# Patient Record
Sex: Male | Born: 1960 | Race: White | Hispanic: No | Smoking: Former smoker
Health system: Southern US, Community
[De-identification: ages and names within clinical notes are randomized; demographics above are authoritative.]

## PROBLEM LIST (undated history)

## (undated) DIAGNOSIS — C801 Malignant (primary) neoplasm, unspecified: Secondary | ICD-10-CM

## (undated) DIAGNOSIS — E119 Type 2 diabetes mellitus without complications: Secondary | ICD-10-CM

## (undated) DIAGNOSIS — E785 Hyperlipidemia, unspecified: Secondary | ICD-10-CM

## (undated) DIAGNOSIS — N400 Enlarged prostate without lower urinary tract symptoms: Secondary | ICD-10-CM

## (undated) HISTORY — DX: Benign prostatic hyperplasia without lower urinary tract symptoms: N40.0

## (undated) HISTORY — PX: KNEE ARTHROSCOPY: SUR90

## (undated) HISTORY — DX: Hyperlipidemia, unspecified: E78.5

---

## 2005-09-09 ENCOUNTER — Ambulatory Visit (HOSPITAL_BASED_OUTPATIENT_CLINIC_OR_DEPARTMENT_OTHER): Admission: RE | Admit: 2005-09-09 | Discharge: 2005-09-09 | Payer: Self-pay | Admitting: Family Medicine

## 2005-09-15 ENCOUNTER — Ambulatory Visit: Payer: Self-pay | Admitting: Internal Medicine

## 2005-10-06 ENCOUNTER — Ambulatory Visit (HOSPITAL_BASED_OUTPATIENT_CLINIC_OR_DEPARTMENT_OTHER): Admission: RE | Admit: 2005-10-06 | Discharge: 2005-10-06 | Payer: Self-pay | Admitting: Family Medicine

## 2005-10-13 ENCOUNTER — Ambulatory Visit: Payer: Self-pay | Admitting: Internal Medicine

## 2016-07-14 DIAGNOSIS — E119 Type 2 diabetes mellitus without complications: Secondary | ICD-10-CM

## 2016-07-14 HISTORY — DX: Type 2 diabetes mellitus without complications: E11.9

## 2016-07-29 ENCOUNTER — Inpatient Hospital Stay (HOSPITAL_COMMUNITY)
Admission: EM | Admit: 2016-07-29 | Discharge: 2016-08-01 | DRG: 638 | Disposition: A | Discharge status: Home / Self Care | Payer: Self-pay | Attending: Internal Medicine | Admitting: Internal Medicine

## 2016-07-29 ENCOUNTER — Emergency Department (HOSPITAL_COMMUNITY): Payer: Self-pay

## 2016-07-29 ENCOUNTER — Encounter (HOSPITAL_COMMUNITY): Payer: Self-pay | Admitting: Emergency Medicine

## 2016-07-29 DIAGNOSIS — Z87891 Personal history of nicotine dependence: Secondary | ICD-10-CM

## 2016-07-29 DIAGNOSIS — E1122 Type 2 diabetes mellitus with diabetic chronic kidney disease: Secondary | ICD-10-CM | POA: Diagnosis present

## 2016-07-29 DIAGNOSIS — Z8042 Family history of malignant neoplasm of prostate: Secondary | ICD-10-CM

## 2016-07-29 DIAGNOSIS — E1165 Type 2 diabetes mellitus with hyperglycemia: Principal | ICD-10-CM | POA: Diagnosis present

## 2016-07-29 DIAGNOSIS — N39490 Overflow incontinence: Secondary | ICD-10-CM | POA: Diagnosis present

## 2016-07-29 DIAGNOSIS — K5900 Constipation, unspecified: Secondary | ICD-10-CM | POA: Diagnosis present

## 2016-07-29 DIAGNOSIS — K760 Fatty (change of) liver, not elsewhere classified: Secondary | ICD-10-CM | POA: Diagnosis present

## 2016-07-29 DIAGNOSIS — N189 Chronic kidney disease, unspecified: Secondary | ICD-10-CM | POA: Diagnosis present

## 2016-07-29 DIAGNOSIS — Z87442 Personal history of urinary calculi: Secondary | ICD-10-CM

## 2016-07-29 DIAGNOSIS — E119 Type 2 diabetes mellitus without complications: Secondary | ICD-10-CM

## 2016-07-29 DIAGNOSIS — E861 Hypovolemia: Secondary | ICD-10-CM | POA: Diagnosis present

## 2016-07-29 DIAGNOSIS — N1330 Unspecified hydronephrosis: Secondary | ICD-10-CM | POA: Diagnosis present

## 2016-07-29 DIAGNOSIS — N179 Acute kidney failure, unspecified: Secondary | ICD-10-CM | POA: Diagnosis present

## 2016-07-29 DIAGNOSIS — R339 Retention of urine, unspecified: Secondary | ICD-10-CM | POA: Diagnosis present

## 2016-07-29 DIAGNOSIS — B372 Candidiasis of skin and nail: Secondary | ICD-10-CM | POA: Diagnosis present

## 2016-07-29 DIAGNOSIS — Z820 Family history of epilepsy and other diseases of the nervous system: Secondary | ICD-10-CM

## 2016-07-29 DIAGNOSIS — I129 Hypertensive chronic kidney disease with stage 1 through stage 4 chronic kidney disease, or unspecified chronic kidney disease: Secondary | ICD-10-CM | POA: Diagnosis present

## 2016-07-29 DIAGNOSIS — N3941 Urge incontinence: Secondary | ICD-10-CM | POA: Diagnosis present

## 2016-07-29 DIAGNOSIS — R000 Tachycardia, unspecified: Secondary | ICD-10-CM | POA: Diagnosis present

## 2016-07-29 DIAGNOSIS — R739 Hyperglycemia, unspecified: Secondary | ICD-10-CM | POA: Diagnosis present

## 2016-07-29 DIAGNOSIS — N400 Enlarged prostate without lower urinary tract symptoms: Secondary | ICD-10-CM | POA: Diagnosis present

## 2016-07-29 DIAGNOSIS — E669 Obesity, unspecified: Secondary | ICD-10-CM

## 2016-07-29 DIAGNOSIS — K8020 Calculus of gallbladder without cholecystitis without obstruction: Secondary | ICD-10-CM | POA: Diagnosis present

## 2016-07-29 DIAGNOSIS — I10 Essential (primary) hypertension: Secondary | ICD-10-CM | POA: Diagnosis present

## 2016-07-29 DIAGNOSIS — N138 Other obstructive and reflux uropathy: Secondary | ICD-10-CM | POA: Diagnosis present

## 2016-07-29 DIAGNOSIS — R310 Gross hematuria: Secondary | ICD-10-CM | POA: Diagnosis not present

## 2016-07-29 DIAGNOSIS — N132 Hydronephrosis with renal and ureteral calculous obstruction: Secondary | ICD-10-CM | POA: Diagnosis present

## 2016-07-29 DIAGNOSIS — N3289 Other specified disorders of bladder: Secondary | ICD-10-CM | POA: Diagnosis present

## 2016-07-29 DIAGNOSIS — K5790 Diverticulosis of intestine, part unspecified, without perforation or abscess without bleeding: Secondary | ICD-10-CM

## 2016-07-29 DIAGNOSIS — Z8249 Family history of ischemic heart disease and other diseases of the circulatory system: Secondary | ICD-10-CM

## 2016-07-29 HISTORY — DX: Type 2 diabetes mellitus without complications: E11.9

## 2016-07-29 LAB — URINALYSIS, ROUTINE W REFLEX MICROSCOPIC
BILIRUBIN URINE: NEGATIVE
Bacteria, UA: NONE SEEN
Ketones, ur: NEGATIVE mg/dL
Leukocytes, UA: NEGATIVE
NITRITE: NEGATIVE
PH: 5 (ref 5.0–8.0)
Protein, ur: NEGATIVE mg/dL
SPECIFIC GRAVITY, URINE: 1.005 (ref 1.005–1.030)
Squamous Epithelial / LPF: NONE SEEN

## 2016-07-29 LAB — COMPREHENSIVE METABOLIC PANEL
ALBUMIN: 3.5 g/dL (ref 3.5–5.0)
ALK PHOS: 103 U/L (ref 38–126)
ALT: 32 U/L (ref 17–63)
AST: 21 U/L (ref 15–41)
Anion gap: 12 (ref 5–15)
BILIRUBIN TOTAL: 0.2 mg/dL — AB (ref 0.3–1.2)
BUN: 26 mg/dL — ABNORMAL HIGH (ref 6–20)
CALCIUM: 9.8 mg/dL (ref 8.9–10.3)
CO2: 27 mmol/L (ref 22–32)
CREATININE: 2.51 mg/dL — AB (ref 0.61–1.24)
Chloride: 99 mmol/L — ABNORMAL LOW (ref 101–111)
GFR calc Af Amer: 32 mL/min — ABNORMAL LOW (ref >60)
GFR calc non Af Amer: 28 mL/min — ABNORMAL LOW (ref >60)
GLUCOSE: 560 mg/dL — AB (ref 65–99)
Potassium: 3.9 mmol/L (ref 3.5–5.1)
Sodium: 138 mmol/L (ref 135–145)
TOTAL PROTEIN: 7.2 g/dL (ref 6.5–8.1)

## 2016-07-29 LAB — CBG MONITORING, ED
Glucose-Capillary: 452 mg/dL — ABNORMAL HIGH (ref 65–99)
Glucose-Capillary: 446 mg/dL — ABNORMAL HIGH (ref 65–99)

## 2016-07-29 LAB — CBC WITH DIFFERENTIAL/PLATELET
BASOS PCT: 0 %
Basophils Absolute: 0 10*3/uL (ref 0.0–0.1)
EOS ABS: 0.1 10*3/uL (ref 0.0–0.7)
EOS PCT: 1 %
HCT: 44.8 % (ref 39.0–52.0)
Hemoglobin: 14.8 g/dL (ref 13.0–17.0)
Lymphocytes Relative: 16 %
Lymphs Abs: 2.2 10*3/uL (ref 0.7–4.0)
MCH: 28.5 pg (ref 26.0–34.0)
MCHC: 33 g/dL (ref 30.0–36.0)
MCV: 86.2 fL (ref 78.0–100.0)
MONO ABS: 0.9 10*3/uL (ref 0.1–1.0)
MONOS PCT: 6 %
Neutro Abs: 10.7 10*3/uL — ABNORMAL HIGH (ref 1.7–7.7)
Neutrophils Relative %: 77 %
PLATELETS: 212 10*3/uL (ref 150–400)
RBC: 5.2 MIL/uL (ref 4.22–5.81)
RDW: 12.9 % (ref 11.5–15.5)
WBC: 13.9 10*3/uL — ABNORMAL HIGH (ref 4.0–10.5)

## 2016-07-29 LAB — SODIUM, URINE, RANDOM: SODIUM UR: 42 mmol/L

## 2016-07-29 LAB — CREATININE, URINE, RANDOM: Creatinine, Urine: 14.55 mg/dL

## 2016-07-29 LAB — GLUCOSE, CAPILLARY: Glucose-Capillary: 354 mg/dL — ABNORMAL HIGH (ref 65–99)

## 2016-07-29 IMAGING — CT CT RENAL STONE PROTOCOL
2 of 4 series · 10 of 46 positions shown, 11 images · non-contrast
Comparison: None.

CLINICAL DATA: Left lower quadrant pain an urinary retention

EXAM:
CT ABDOMEN AND PELVIS WITHOUT CONTRAST
TECHNIQUE: Multidetector CT imaging of the abdomen and pelvis was performed
following the standard protocol without IV contrast.

[Series 201: stone study, idose (2) · axial · 0.82mm/px · z∈[-919,-539]mm · 7 of 92 slices shown, 8 images]
[im 8/92  soft-tissue]
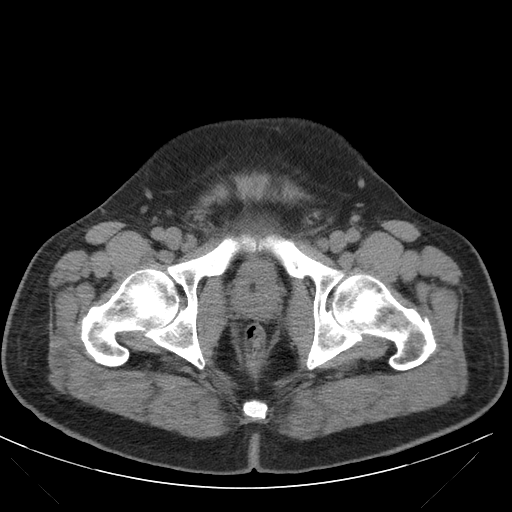
[im 8/92  bone]
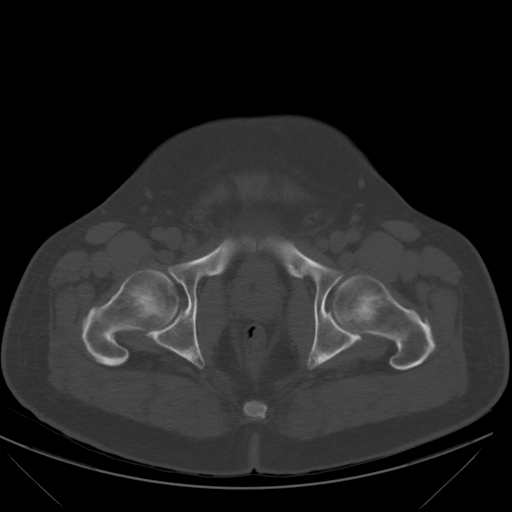
[im 20/92  soft-tissue]
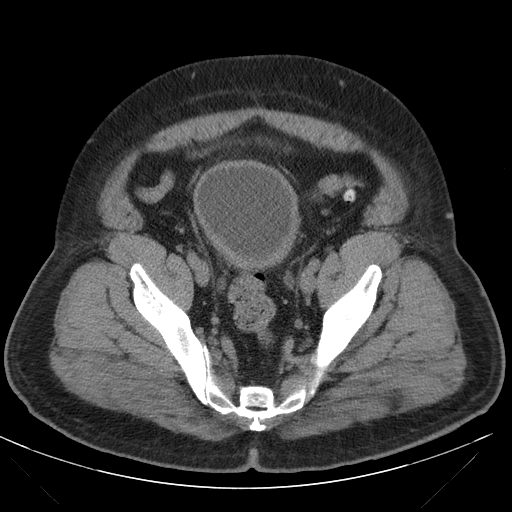
[im 32/92  soft-tissue]
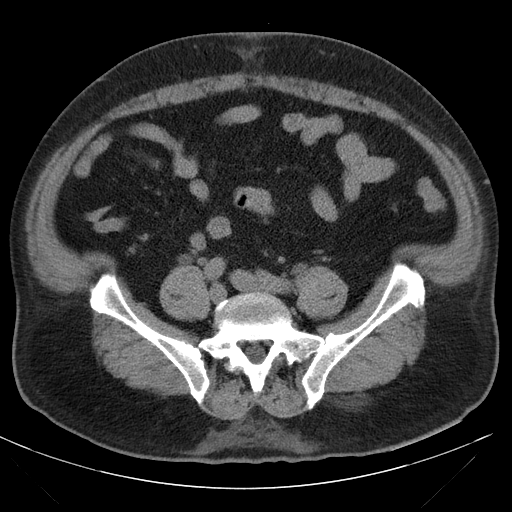
[im 48/92  soft-tissue]
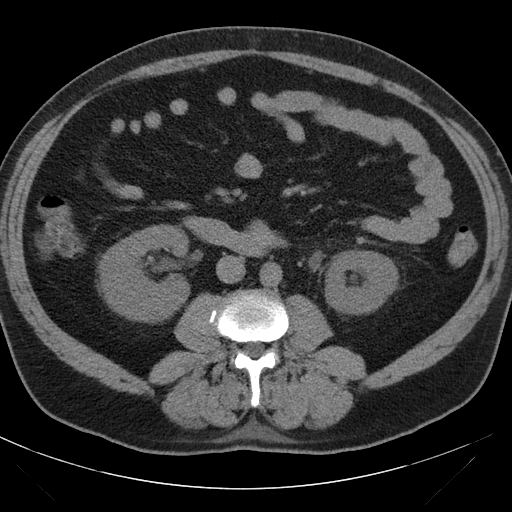
[im 60/92  soft-tissue]
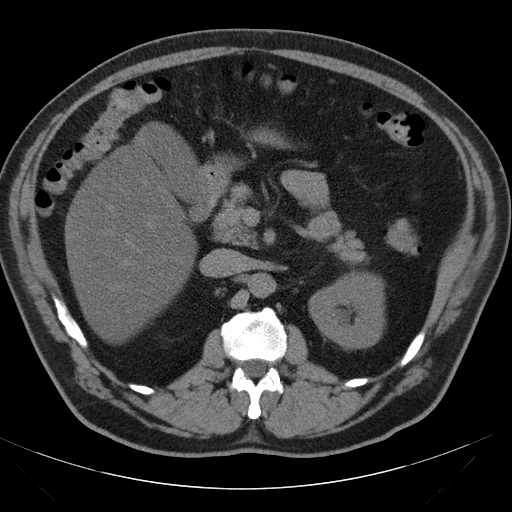
[im 72/92  soft-tissue]
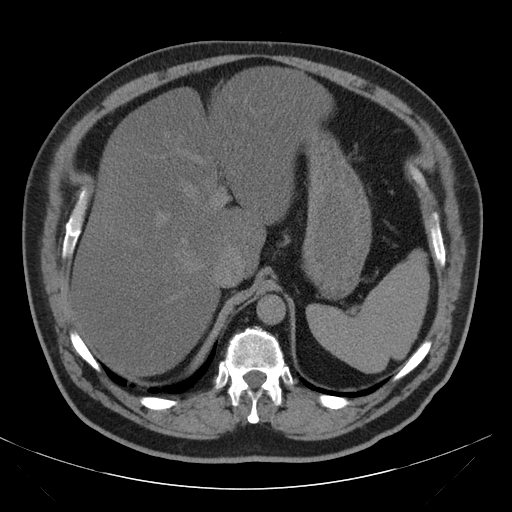
[im 84/92  soft-tissue]
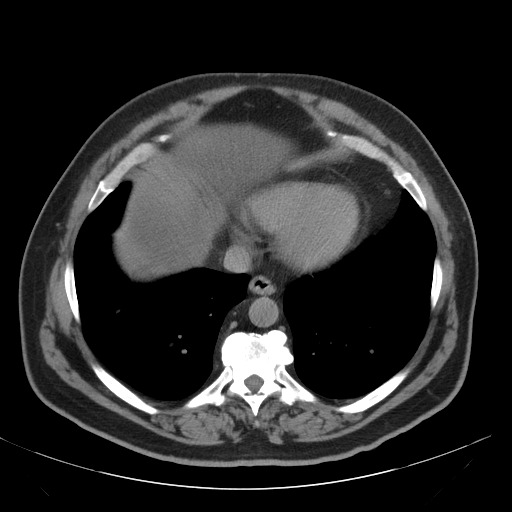

[Series 203: coronals, idose (2) · coronal · 0.45mm/px · 3 of 140 slices shown]
[im 47/140  soft-tissue]
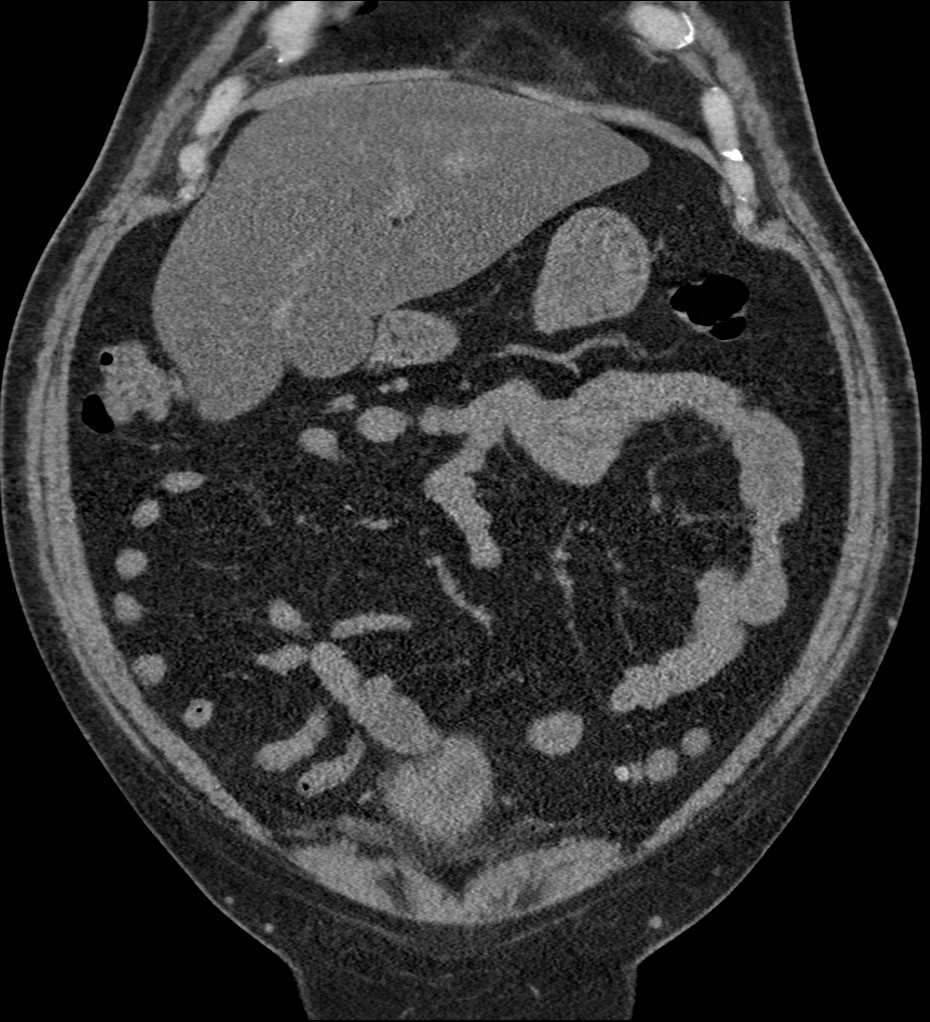
[im 62/140  soft-tissue]
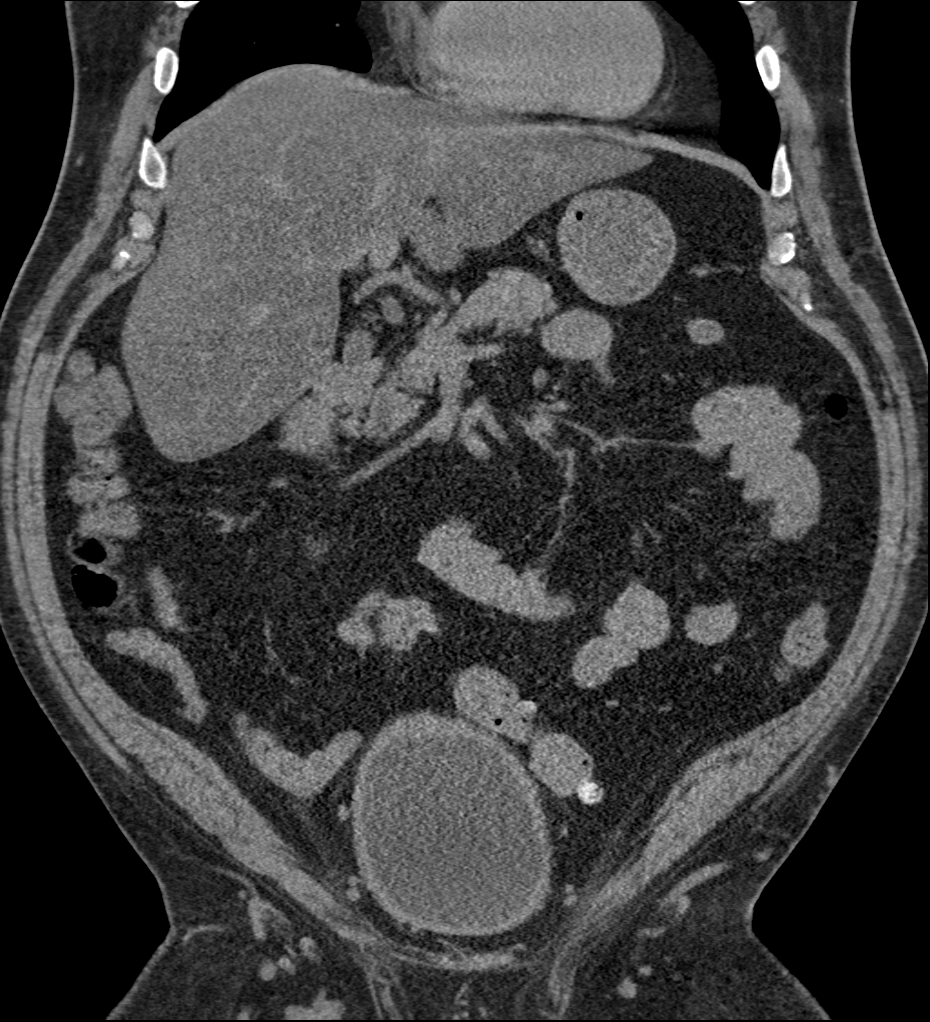
[im 78/140  soft-tissue]
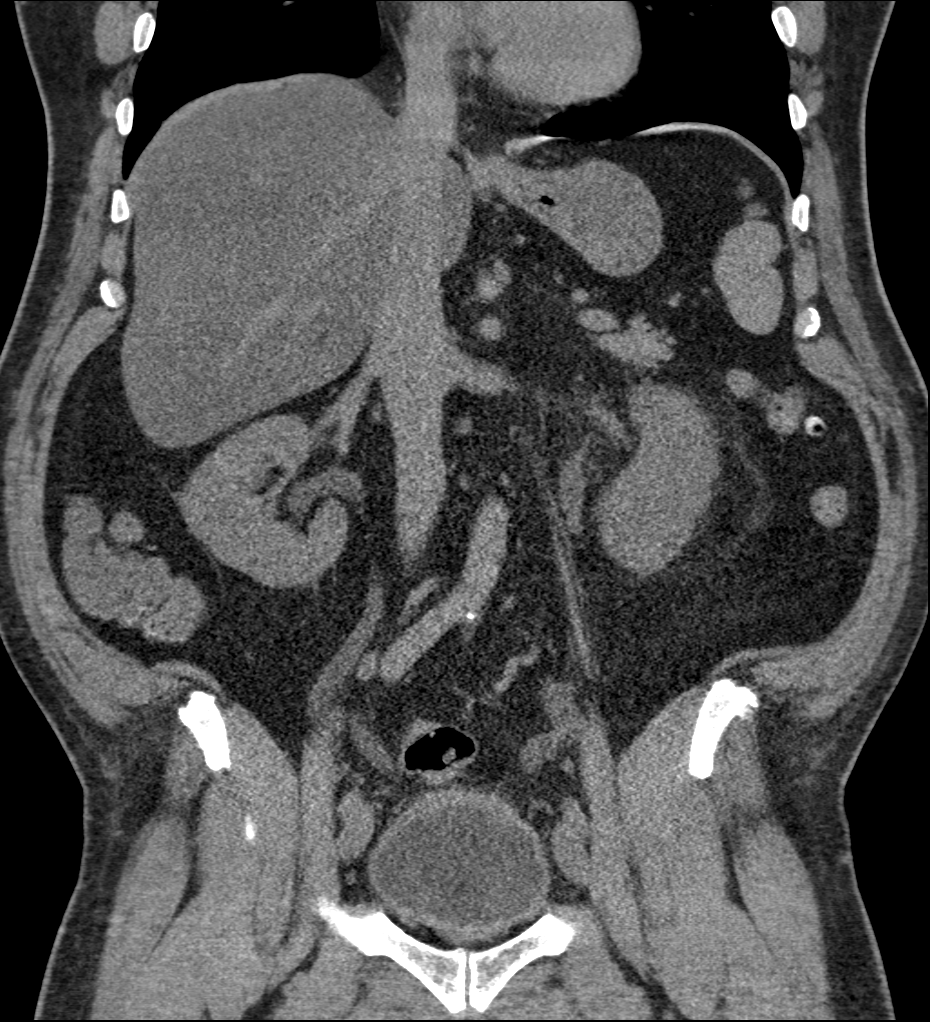

[10 of 46 positions shown; findings below may reference images not displayed]

FINDINGS: Lower chest: No acute abnormality.

Hepatobiliary: The liver is diffusely fatty infiltrated. The
gallbladder is well distended. A single gallstone is noted in the
region of the gallbladder neck

Pancreas: Unremarkable. No pancreatic ductal dilatation or
surrounding inflammatory changes.

Spleen: Normal in size without focal abnormality.

Adrenals/Urinary Tract: The adrenal glands are within normal limits.
The left kidney demonstrates mild hydronephrosis and hydroureter
which extends to the level of the ureterovesical junction. Similar
changes are noted on the right although the degree of inflammatory
change is less on the right. Bladder is well distended although a
Foley catheter is noted within. Some bladder wall thickening is
noted as well.

Stomach/Bowel: Scattered diverticular change of the colon is noted
without diverticulitis. The appendix is within normal limits.

Vascular/Lymphatic: No significant vascular findings are present. No
enlarged abdominal or pelvic lymph nodes.

Reproductive: Prostate is unremarkable.

Other: No abdominal wall hernia or abnormality. No abdominopelvic
ascites.

Musculoskeletal: No acute or significant osseous findings.
IMPRESSION: Mild fullness of the renal collecting systems and ureters
bilaterally slightly greater on the left than the right with some
inflammatory changes surrounding the left kidney and left renal
pelvis. No definitive obstructing stone is seen.

Well distended bladder with Foley catheter in place. Some bladder
wall thickening is noted. This may contribute to the degree of
collecting system dilatation.

Diverticular change without diverticulitis.

Cholelithiasis

## 2016-07-29 MED ORDER — DEXTROSE 50 % IV SOLN: 25.0000 mL | INTRAVENOUS | Status: DC | PRN

## 2016-07-29 MED ORDER — INSULIN ASPART 100 UNIT/ML ~~LOC~~ SOLN
8.0000 [IU] | Freq: Once | SUBCUTANEOUS | Status: AC
  Administered 2016-07-29: 8 [IU] via SUBCUTANEOUS
  Filled 2016-07-29: qty 1

## 2016-07-29 MED ORDER — MORPHINE SULFATE (PF) 4 MG/ML IV SOLN
4.0000 mg | Freq: Once | INTRAVENOUS | Status: AC
  Administered 2016-07-29: 4 mg via INTRAVENOUS
  Filled 2016-07-29: qty 1

## 2016-07-29 MED ORDER — INSULIN ASPART 100 UNIT/ML ~~LOC~~ SOLN
0.0000 [IU] | Freq: Every day | SUBCUTANEOUS | Status: DC
  Administered 2016-07-29: 5 [IU] via SUBCUTANEOUS
  Administered 2016-07-30: 2 [IU] via SUBCUTANEOUS

## 2016-07-29 MED ORDER — SODIUM CHLORIDE 0.9 % IV SOLN: INTRAVENOUS | Status: DC

## 2016-07-29 MED ORDER — HEPARIN SODIUM (PORCINE) 5000 UNIT/ML IJ SOLN
5000.0000 [IU] | Freq: Three times a day (TID) | INTRAMUSCULAR | Status: DC
  Administered 2016-07-29 – 2016-08-01 (×8): 5000 [IU] via SUBCUTANEOUS
  Filled 2016-07-29 (×9): qty 1

## 2016-07-29 MED ORDER — ACETAMINOPHEN 650 MG RE SUPP: 650.0000 mg | Freq: Four times a day (QID) | RECTAL | Status: DC | PRN

## 2016-07-29 MED ORDER — ACETAMINOPHEN 325 MG PO TABS
650.0000 mg | ORAL_TABLET | Freq: Four times a day (QID) | ORAL | Status: DC | PRN
  Administered 2016-07-29 – 2016-08-01 (×4): 650 mg via ORAL
  Filled 2016-07-29 (×4): qty 2

## 2016-07-29 MED ORDER — INSULIN ASPART 100 UNIT/ML ~~LOC~~ SOLN
0.0000 [IU] | Freq: Three times a day (TID) | SUBCUTANEOUS | Status: DC
  Administered 2016-07-30: 5 [IU] via SUBCUTANEOUS
  Administered 2016-07-30: 15 [IU] via SUBCUTANEOUS
  Administered 2016-07-30: 11 [IU] via SUBCUTANEOUS
  Administered 2016-07-31: 8 [IU] via SUBCUTANEOUS
  Administered 2016-07-31: 3 [IU] via SUBCUTANEOUS
  Administered 2016-07-31 – 2016-08-01 (×2): 5 [IU] via SUBCUTANEOUS
  Administered 2016-08-01: 3 [IU] via SUBCUTANEOUS

## 2016-07-29 MED ORDER — SODIUM CHLORIDE 0.9 % IV BOLUS (SEPSIS)
1000.0000 mL | Freq: Once | INTRAVENOUS | Status: AC
  Administered 2016-07-29: 1000 mL via INTRAVENOUS

## 2016-07-29 MED ORDER — SODIUM CHLORIDE 0.9 % IV SOLN: 1000.0000 mL | Freq: Once | INTRAVENOUS | Status: DC

## 2016-07-29 MED ORDER — SODIUM CHLORIDE 0.9 % IV SOLN: 1000.0000 mL | INTRAVENOUS | Status: DC

## 2016-07-29 MED ORDER — DEXTROSE-NACL 5-0.45 % IV SOLN: INTRAVENOUS | Status: DC

## 2016-07-29 MED ORDER — SODIUM CHLORIDE 0.9 % IV SOLN
INTRAVENOUS | Status: DC
  Administered 2016-07-29 – 2016-07-30 (×2): 1000 mL via INTRAVENOUS
  Administered 2016-07-30 (×2): via INTRAVENOUS

## 2016-07-29 MED ORDER — SENNOSIDES-DOCUSATE SODIUM 8.6-50 MG PO TABS: 2.0000 | ORAL_TABLET | Freq: Every evening | ORAL | Status: DC | PRN

## 2016-07-29 MED ORDER — SODIUM CHLORIDE 0.9 % IV SOLN
INTRAVENOUS | Status: DC
  Filled 2016-07-29: qty 2.5

## 2016-07-29 MED ORDER — AMLODIPINE BESYLATE 10 MG PO TABS
10.0000 mg | ORAL_TABLET | Freq: Every day | ORAL | Status: DC
  Administered 2016-07-29 – 2016-07-30 (×2): 10 mg via ORAL
  Filled 2016-07-29 (×2): qty 1

## 2016-07-29 MED ORDER — TAMSULOSIN HCL 0.4 MG PO CAPS
0.4000 mg | ORAL_CAPSULE | Freq: Every day | ORAL | Status: DC
  Administered 2016-07-29 – 2016-07-31 (×3): 0.4 mg via ORAL
  Filled 2016-07-29 (×3): qty 1

## 2016-07-29 MED ORDER — INSULIN GLARGINE 100 UNIT/ML ~~LOC~~ SOLN
5.0000 [IU] | Freq: Every day | SUBCUTANEOUS | Status: DC
  Administered 2016-07-29: 5 [IU] via SUBCUTANEOUS
  Filled 2016-07-29 (×2): qty 0.05

## 2016-07-29 MED ORDER — INSULIN ASPART 100 UNIT/ML ~~LOC~~ SOLN
8.0000 [IU] | Freq: Once | SUBCUTANEOUS | Status: AC
  Administered 2016-07-29: 8 [IU] via SUBCUTANEOUS

## 2016-07-29 MED ORDER — FLUCONAZOLE 100 MG PO TABS
150.0000 mg | ORAL_TABLET | Freq: Once | ORAL | Status: AC
  Administered 2016-07-29: 150 mg via ORAL
  Filled 2016-07-29: qty 2

## 2016-07-29 MED ORDER — NYSTATIN 100000 UNIT/GM EX POWD
Freq: Three times a day (TID) | CUTANEOUS | Status: DC
  Administered 2016-07-29 – 2016-07-30 (×3): via TOPICAL
  Administered 2016-07-30: 1 g via TOPICAL
  Administered 2016-07-31 – 2016-08-01 (×3): via TOPICAL
  Filled 2016-07-29: qty 15

## 2016-07-29 MED ORDER — INSULIN ASPART 100 UNIT/ML ~~LOC~~ SOLN: 5.0000 [IU] | Freq: Three times a day (TID) | SUBCUTANEOUS | Status: DC

## 2016-07-29 MED ORDER — INSULIN REGULAR BOLUS VIA INFUSION
0.0000 [IU] | Freq: Three times a day (TID) | INTRAVENOUS | Status: DC
  Filled 2016-07-29: qty 10

## 2016-07-29 NOTE — H&P (Signed)
Date: 07/29/2016               Patient Name:  Jeffery [name] MRN: [id number]  DOB: 07-06-1960 Age / Sex: 56 y.o.,  male   PCP: [name], MD         Medical Service: Internal Medicine Teaching Service         Attending Physician: Dr. Lucious Groves, DO    First Contact: Dr. Gay Filler Pager: [phone]  Second Contact: Dr. Tiburcio Pea Pager: [phone]       After Hours (After 5p/  First Contact Pager: [phone]  weekends / holidays): Second Contact Pager: [phone]   Chief Complaint: Urinary Incontinence  History of Present Illness:  Pt is a 56 y.o. male w/o regular medical care who presents to the ED for progressive urinary hesitancy and retention, which developed into urge incontinence and overflow incontinence during the last 3 days.  Patient states that since 2016 he has experienced urinary hesitancy and retention. He never sought medical care as the problem wasn't worsening. However, recently patient has had to stand up to urinate and now his urine will spray instead of a steady stream. His urination increasingly would stop and start and he was urinating more frequently. 3 days ago, patient experienced nocturia which was new for him and had uncontrolled urination with urinary incontinence, going through 11 pairs of underwear. Patient developed left sided abdominal pain and flank pain yesterday.   Patient increased thirst for the past 4 to 5 years. He states he drinks 2 L of water everyday. In the last 4 years, patient has experienced blurry vision.   Patient also noticed a rash on his penis 3 months ago which he calls a yeast infection. Patient admits to constipation for the past 2 weeks. Patient normally has a BM once a day. Lately, he has had to strain. He reports a BM yesterday.  Meds: Current Facility-Administered Medications  Medication Dose Route Frequency Provider Last Rate Last Dose  . 0.9 %  sodium chloride infusion   Intravenous Continuous Alexa R Burns, MD        . insulin aspart (novoLOG) injection 0-15 Units  0-15 Units Subcutaneous TID WC Alexa R Burns, MD      . insulin aspart (novoLOG) injection 0-5 Units  0-5 Units Subcutaneous QHS Alexa R Burns, MD      . sodium chloride 0.9 % bolus 1,000 mL  1,000 mL Intravenous Once Charlesetta Shanks, MD       No current outpatient prescriptions on file.   Allergies: Allergies as of 07/29/2016  . (No Known Allergies)   History reviewed. No pertinent past medical history.  Family History:  Mother: Sleep Apnea, OSA Father: CAD, prostate cancer  Social History:  Tobacco Use: Quit 3 years ago, previous 60 pack year history Alcohol Use: Denies Illicit Drug Use: Marijuana, occasional use Lives with mom. Son in college.   Review of Systems: A complete ROS was reviewed and negative except as per HPI.   Physical Exam: Blood pressure (!) 153/101, pulse (!) 112, temperature 98.7 F (37.1 C), temperature source Oral, resp. rate 18, SpO2 91 %. Physical Exam  Constitutional: He is oriented to person, place, and time. He appears well-developed and well-nourished. He is cooperative. No distress.  Obese male lying in bed, appears mildly groggy after morphine injection, NAD.  HENT:  Head: Normocephalic and atraumatic.  Right Ear: Hearing normal.  Left Ear: Hearing normal.  Nose: Nose normal.  Mouth/Throat: Mucous membranes  are normal. Mucous membranes are not dry.  Eyes: Pupils are equal, round, and reactive to light.  Cardiovascular: Regular rhythm, S1 normal, S2 normal and intact distal pulses.  Tachycardia present.  Exam reveals no gallop.   No murmur heard. Pulmonary/Chest: Effort normal and breath sounds normal. No respiratory distress. He has no wheezes. He has no rhonchi. He has no rales. He exhibits no tenderness.  Abdominal: Soft. Normal appearance and bowel sounds are normal. He exhibits no fluid wave and no ascites. There is no hepatosplenomegaly. There is no tenderness. There is CVA tenderness (mild  L flank tenderness to deep palpation).  Genitourinary:  Genitourinary Comments: Erythematous papular rash on the glans and in the panus folds of the groin, raised and moist. Scrotum erythematous. Non-tender to palpation. No discharge noted. No ulcerations noted. Satellite lesions noted on the bilateral thighs.   Musculoskeletal: He exhibits edema (trace BLE).  Neurological: He is alert and oriented to person, place, and time. He has normal strength.  Skin: Skin is warm, dry and intact. He is not diaphoretic.  Psychiatric: He has a normal mood and affect. His speech is normal and behavior is normal.   Ct Renal Stone Study  Result Date: 07/29/2016 CLINICAL DATA:  Left lower quadrant pain an urinary retention EXAM: CT ABDOMEN AND PELVIS WITHOUT CONTRAST TECHNIQUE: Multidetector CT imaging of the abdomen and pelvis was performed following the standard protocol without IV contrast. COMPARISON:  None. FINDINGS: Lower chest: No acute abnormality. Hepatobiliary: The liver is diffusely fatty infiltrated. The gallbladder is well distended. A single gallstone is noted in the region of the gallbladder neck Pancreas: Unremarkable. No pancreatic ductal dilatation or surrounding inflammatory changes. Spleen: Normal in size without focal abnormality. Adrenals/Urinary Tract: The adrenal glands are within normal limits. The left kidney demonstrates mild hydronephrosis and hydroureter which extends to the level of the ureterovesical junction. Similar changes are noted on the right although the degree of inflammatory change is less on the right. Bladder is well distended although a Foley catheter is noted within. Some bladder wall thickening is noted as well. Stomach/Bowel: Scattered diverticular change of the colon is noted without diverticulitis. The appendix is within normal limits. Vascular/Lymphatic: No significant vascular findings are present. No enlarged abdominal or pelvic lymph nodes. Reproductive: Prostate is  unremarkable. Other: No abdominal wall hernia or abnormality. No abdominopelvic ascites. Musculoskeletal: No acute or significant osseous findings. IMPRESSION: Mild fullness of the renal collecting systems and ureters bilaterally slightly greater on the left than the right with some inflammatory changes surrounding the left kidney and left renal pelvis. No definitive obstructing stone is seen. Well distended bladder with Foley catheter in place. Some bladder wall thickening is noted. This may contribute to the degree of collecting system dilatation. Diverticular change without diverticulitis. Cholelithiasis. Electronically Signed   By: Inez Catalina M.D.   On: 07/29/2016 13:23   Assessment & Plan by Problem: Principal Problem:   Hyperglycemia Active Problems:   Urinary retention   AKI (acute kidney injury) ([place])   Cholelithiasis   Fatty liver   Hydronephrosis   Hypertension  Pt is a 56 y.o. male w/o known PMH who presents with urinary retention and AKI, found to have hyperglycemia, HTN, and tachycardia.  Urinary Retention: Pt with a h/o several years of urinary hesitancy and weak stream. Recently developed polyuria with urge incontinence and overflow incontinence at night when asleep x 3 days. He denies any abdominal pain except for acute onset of L flank pain 1 day ago. Remote  h/o renal calculi. CT Renal Stone showed mild hydronephrosis bilaterally with inflammatory changes to the L kidney. Small blood on UA obtained from foley cath, no s/s of infection. Mild amount of asymetric L prostatic hypertrophy noted on CT. - foley placed, maintain - outpt f/u with Urology for continued w/u - tamsulosin 0.4mg  empirically for BPH - control bladder spasms with   Hyperglycemia: Pt found to have BG of 560 w/o ketones of acidosis. Suspect underlying DM which has not been treated. Will check A1c and control BG w/ SSI-m. CBG q4h. Will correct hypovolemia w/ 2L NS bolus + 125cc/hr.  AKI: Likely secondary to  obstruction. Inflammatory changes of L kidney may be 2/2 medicorenal disease. Will trend SCr s/p foley. Check urine studies.  HTN: BP elevated on arrival with SBP 160s, DBP 100s. Will start amlodipine 10mg .  Constipation: Suspect that this may be 2/2 overly distended bladder. Diverticuli and stool noted on CT. Will treat with Senna-docusate x2 qHS.  Tachycardia: Pt presented w/ NSR 110s. Denies palpitations, CP, SOB. Will get EKG. May be 2/2 pain and intravascular hypovolemia 2/2 hyperglycemia. Correct with IVF as above.  Candidal infection of groin: Pt c/o rash on penis and in groin. Denies penile discharge. Not sexually active. C/w candida on exam, likely exacerbated by hyperglycemia. Fluconazole 150mg  once + Nystatin powder topically. Also suspect local irritation from urinary incontinence.  Dispo: Admit patient to Observation with expected length of stay less than 2 midnights.  Signed: Martyn Malay, DO PGY-3 Internal Medicine Resident Pager # [phone] 07/29/2016 3:59 PM

## 2016-07-29 NOTE — Progress Notes (Signed)
Patient trasfered from ED to [phone] via stretcher; alert and oriented x 4;  complaints of pain on the Foley's insertion site; IV saline  in RAC running fluids@125  cc/hr; . Orient patient to room and unit;  gave patient care guide; instructed how to use the call bell and  fall risk precautions. Per MD order, patient received an additional 8 units Novolog and CBG will be rechecked in 2 hrs. Will continue to monitor the patient.

## 2016-07-29 NOTE — ED Provider Notes (Signed)
[facility] DEPT Provider Note   CSN: [id number] Arrival date & time: 07/29/16  5277     History   Chief Complaint Chief Complaint  Patient presents with  . Urinary Retention    HPI Jeffery [name] is a 56 y.o. male.  HPI Patient has been noticing decrease in urinary stream per year. As it was not impeding his function, he did not specifically seek care for this problem. He reports now however over the past several weeks and especially days things have gotten much worse. He reports he started losing control of his bladder. He reports that he was in a seated position he could not urinate and in a standing position he would lose continence. He reports now he is going through about 11 Underwear per day. He has some pain in his left flank. That's been an ongoing problem. He reports is more noticeable if he pushes into the area. He denies significant suprapubic pain but reports with pressure at uncomfortable. No fever, nausea or vomiting. Or see drinks about a gallon of water a day. He has tried lemon juice and cranberry juice. Last sexual activity about 6 months ago. He reports he has a red rash in his groin and scrotum and penis that he has tried to put cortisone on but no improvement. He states he saw his family doctor about a year ago and that was his last medical care. History reviewed. No pertinent past medical history.  Patient Active Problem List   Diagnosis Date Noted  . Hyperglycemia 07/29/2016  . Urinary retention 07/29/2016  . AKI (acute kidney injury) ([place]) 07/29/2016  . Cholelithiasis 07/29/2016  . Fatty liver 07/29/2016  . Hydronephrosis 07/29/2016  . Hypertension 07/29/2016    History reviewed. No pertinent surgical history.     Home Medications    Prior to Admission medications   Not on File    Family History No family history on file.  Social History Social History  Substance Use Topics  . Smoking status: Not on file  . Smokeless tobacco: Not on  file  . Alcohol use Not on file     Allergies   Patient has no known allergies.   Review of Systems Review of Systems  All other systems reviewed and are negative.    Physical Exam Updated Vital Signs BP (!) 152/99   Pulse (!) 120   Temp 98.7 F (37.1 C) (Oral)   Resp (!) 21   SpO2 94%   Physical Exam  Constitutional: He is oriented to person, place, and time.  Alert and nontoxic. Morbid obesity. No respiratory distress. Up and ambulatory in the emergency department.  HENT:  Head: Atraumatic.  Mouth/Throat: Oropharynx is clear and moist.  Eyes: EOM are normal.  Cardiovascular: Normal rate, regular rhythm, normal heart sounds and intact distal pulses.   Pulmonary/Chest: Effort normal and breath sounds normal.  Abdominal:  Suprapubic firmness up to the infraumbilical area. Discomfort with palpation, create sensation of urgency to urinate. No CVA tenderness.  Genitourinary:  Genitourinary Comments: Penis and scrotum grossly normal but patient has significant candidal rash over the penis,  scrotum and intertriginous folds.  Musculoskeletal: Normal range of motion.  1+ pitting edema bilaterally. Patient reports this is chronic.  Neurological: He is alert and oriented to person, place, and time. No cranial nerve deficit. He exhibits normal muscle tone. Coordination normal.  Skin: Skin is warm and dry.  Psychiatric: He has a normal mood and affect.     ED Treatments / Results  Labs (all labs ordered are listed, but only abnormal results are displayed) Labs Reviewed  COMPREHENSIVE METABOLIC PANEL - Abnormal; Notable for the following:       Result Value   Chloride 99 (*)    Glucose, Bld 560 (*)    BUN 26 (*)    Creatinine, Ser 2.51 (*)    Total Bilirubin 0.2 (*)    GFR calc non Af Amer 28 (*)    GFR calc Af Amer 32 (*)    All other components within normal limits  CBC WITH DIFFERENTIAL/PLATELET - Abnormal; Notable for the following:    WBC 13.9 (*)    Neutro Abs  10.7 (*)    All other components within normal limits  URINALYSIS, ROUTINE W REFLEX MICROSCOPIC - Abnormal; Notable for the following:    Color, Urine STRAW (*)    Glucose, UA >=500 (*)    Hgb urine dipstick SMALL (*)    All other components within normal limits  CBG MONITORING, ED - Abnormal; Notable for the following:    Glucose-Capillary 452 (*)    All other components within normal limits  CBG MONITORING, ED - Abnormal; Notable for the following:    Glucose-Capillary 446 (*)    All other components within normal limits  HEMOGLOBIN A1C  SODIUM, URINE, RANDOM  CREATININE, URINE, RANDOM    EKG  EKG Interpretation None       Radiology Ct Renal Stone Study  Result Date: 07/29/2016 CLINICAL DATA:  Left lower quadrant pain an urinary retention EXAM: CT ABDOMEN AND PELVIS WITHOUT CONTRAST TECHNIQUE: Multidetector CT imaging of the abdomen and pelvis was performed following the standard protocol without IV contrast. COMPARISON:  None. FINDINGS: Lower chest: No acute abnormality. Hepatobiliary: The liver is diffusely fatty infiltrated. The gallbladder is well distended. A single gallstone is noted in the region of the gallbladder neck Pancreas: Unremarkable. No pancreatic ductal dilatation or surrounding inflammatory changes. Spleen: Normal in size without focal abnormality. Adrenals/Urinary Tract: The adrenal glands are within normal limits. The left kidney demonstrates mild hydronephrosis and hydroureter which extends to the level of the ureterovesical junction. Similar changes are noted on the right although the degree of inflammatory change is less on the right. Bladder is well distended although a Foley catheter is noted within. Some bladder wall thickening is noted as well. Stomach/Bowel: Scattered diverticular change of the colon is noted without diverticulitis. The appendix is within normal limits. Vascular/Lymphatic: No significant vascular findings are present. No enlarged abdominal  or pelvic lymph nodes. Reproductive: Prostate is unremarkable. Other: No abdominal wall hernia or abnormality. No abdominopelvic ascites. Musculoskeletal: No acute or significant osseous findings. IMPRESSION: Mild fullness of the renal collecting systems and ureters bilaterally slightly greater on the left than the right with some inflammatory changes surrounding the left kidney and left renal pelvis. No definitive obstructing stone is seen. Well distended bladder with Foley catheter in place. Some bladder wall thickening is noted. This may contribute to the degree of collecting system dilatation. Diverticular change without diverticulitis. Cholelithiasis Electronically Signed   By: Inez Catalina M.D.   On: 07/29/2016 13:23    Procedures Procedures (including critical care time) CRITICAL CARE Performed by: Charlesetta Shanks   Total critical care time: 30 minutes  Critical care time was exclusive of separately billable procedures and treating other patients.  Critical care was necessary to treat or prevent imminent or life-threatening deterioration.  Critical care was time spent personally by me on the following activities: development of treatment plan  with patient and/or surrogate as well as nursing, discussions with consultants, evaluation of patient's response to treatment, examination of patient, obtaining history from patient or surrogate, ordering and performing treatments and interventions, ordering and review of laboratory studies, ordering and review of radiographic studies, pulse oximetry and re-evaluation of patient's condition. Medications Ordered in ED Medications  0.9 %  sodium chloride infusion (not administered)  insulin aspart (novoLOG) injection 0-15 Units (not administered)  insulin aspart (novoLOG) injection 0-5 Units (not administered)  insulin aspart (novoLOG) injection 8 Units (not administered)  morphine 4 MG/ML injection 4 mg (4 mg Intravenous Given 07/29/16 1407)  sodium  chloride 0.9 % bolus 1,000 mL (1,000 mLs Intravenous New Bag/Given 07/29/16 1523)  insulin aspart (novoLOG) injection 8 Units (8 Units Subcutaneous Given 07/29/16 1555)     Initial Impression / Assessment and Plan / ED Course  I have reviewed the triage vital signs and the nursing notes.  Pertinent labs & imaging results that were available during my care of the patient were reviewed by me and considered in my medical decision making (see chart for details).      Final Clinical Impressions(s) / ED Diagnoses   Final diagnoses:  Diabetes mellitus, new onset ([place])  Urinary retention   Patient had significant urinary retention. He also at this time has new onset diabetes with large volume diuresis. Patient had emptied over 2 L from his bladder but continued to diuresis another 2 L. At this time with the patient's hyperglycemia and high volume urine output, I felt that he did need inpatient management for new onset hyperglycemia as well as urinary retention with ongoing Foley catheter. Insulin drip was initiated and patient was treated for pain with bladder spasm with morphine. New Prescriptions New Prescriptions   No medications on file     Charlesetta Shanks, MD 07/29/16 1742

## 2016-07-29 NOTE — ED Triage Notes (Signed)
Pt arrives from home with urinary retention. Pt states he has been having trouble emptying his bladder for over 1 year off and on. Pt states yesterday was the last time he was able to urinate fully. He states he has been urinating in small increments. Pt also reports a rash in his groin.

## 2016-07-30 DIAGNOSIS — E119 Type 2 diabetes mellitus without complications: Secondary | ICD-10-CM

## 2016-07-30 LAB — COMPREHENSIVE METABOLIC PANEL
ALBUMIN: 3 g/dL — AB (ref 3.5–5.0)
ALT: 26 U/L (ref 17–63)
AST: 16 U/L (ref 15–41)
Alkaline Phosphatase: 84 U/L (ref 38–126)
Anion gap: 9 (ref 5–15)
BUN: 18 mg/dL (ref 6–20)
CHLORIDE: 107 mmol/L (ref 101–111)
CO2: 27 mmol/L (ref 22–32)
Calcium: 9.1 mg/dL (ref 8.9–10.3)
Creatinine, Ser: 1.62 mg/dL — ABNORMAL HIGH (ref 0.61–1.24)
GFR calc Af Amer: 54 mL/min — ABNORMAL LOW (ref >60)
GFR calc non Af Amer: 47 mL/min — ABNORMAL LOW (ref >60)
GLUCOSE: 373 mg/dL — AB (ref 65–99)
POTASSIUM: 3.5 mmol/L (ref 3.5–5.1)
SODIUM: 143 mmol/L (ref 135–145)
Total Bilirubin: 0.6 mg/dL (ref 0.3–1.2)
Total Protein: 6.8 g/dL (ref 6.5–8.1)

## 2016-07-30 LAB — CBC
HCT: 42.1 % (ref 39.0–52.0)
Hemoglobin: 13.9 g/dL (ref 13.0–17.0)
MCH: 28.8 pg (ref 26.0–34.0)
MCHC: 33 g/dL (ref 30.0–36.0)
MCV: 87.2 fL (ref 78.0–100.0)
Platelets: 205 10*3/uL (ref 150–400)
RBC: 4.83 MIL/uL (ref 4.22–5.81)
RDW: 13.5 % (ref 11.5–15.5)
WBC: 10 10*3/uL (ref 4.0–10.5)

## 2016-07-30 LAB — URINALYSIS, ROUTINE W REFLEX MICROSCOPIC
BILIRUBIN URINE: NEGATIVE
Glucose, UA: >=500 mg/dL — AB
Ketones, ur: NEGATIVE mg/dL
LEUKOCYTES UA: NEGATIVE
Nitrite: NEGATIVE
Protein, ur: 100 mg/dL — AB
SPECIFIC GRAVITY, URINE: 1.003 — AB (ref 1.005–1.030)
SQUAMOUS EPITHELIAL / LPF: NONE SEEN
pH: 6 (ref 5.0–8.0)

## 2016-07-30 LAB — GLUCOSE, CAPILLARY
GLUCOSE-CAPILLARY: 408 mg/dL — AB (ref 65–99)
GLUCOSE-CAPILLARY: 324 mg/dL — AB (ref 65–99)
Glucose-Capillary: 230 mg/dL — ABNORMAL HIGH (ref 65–99)
Glucose-Capillary: 207 mg/dL — ABNORMAL HIGH (ref 65–99)

## 2016-07-30 LAB — HIV ANTIBODY (ROUTINE TESTING W REFLEX): HIV SCREEN 4TH GENERATION: NONREACTIVE

## 2016-07-30 LAB — HEMOGLOBIN A1C: Hgb A1c MFr Bld: >15.5 % — ABNORMAL HIGH (ref 4.8–5.6)

## 2016-07-30 MED ORDER — TRAMADOL-ACETAMINOPHEN 37.5-325 MG PO TABS
1.0000 | ORAL_TABLET | Freq: Once | ORAL | Status: AC
  Administered 2016-07-30: 1 via ORAL
  Filled 2016-07-30: qty 1

## 2016-07-30 MED ORDER — INSULIN ASPART PROT & ASPART (70-30 MIX) 100 UNIT/ML ~~LOC~~ SUSP
10.0000 [IU] | Freq: Two times a day (BID) | SUBCUTANEOUS | Status: DC
  Administered 2016-07-30: 10 [IU] via SUBCUTANEOUS
  Filled 2016-07-30: qty 10

## 2016-07-30 MED ORDER — IBUPROFEN 400 MG PO TABS
800.0000 mg | ORAL_TABLET | Freq: Once | ORAL | Status: AC
  Administered 2016-07-30: 800 mg via ORAL
  Filled 2016-07-30: qty 2

## 2016-07-30 MED ORDER — METFORMIN HCL 500 MG PO TABS
500.0000 mg | ORAL_TABLET | Freq: Every day | ORAL | Status: DC
  Administered 2016-07-30: 500 mg via ORAL
  Filled 2016-07-30: qty 1

## 2016-07-30 MED ORDER — LIVING WELL WITH DIABETES BOOK
Freq: Once | Status: AC
  Administered 2016-07-30: 1
  Filled 2016-07-30: qty 1

## 2016-07-30 MED ORDER — INSULIN ASPART PROT & ASPART (70-30 MIX) 100 UNIT/ML ~~LOC~~ SUSP
17.0000 [IU] | Freq: Two times a day (BID) | SUBCUTANEOUS | Status: DC
  Administered 2016-07-30 – 2016-07-31 (×2): 17 [IU] via SUBCUTANEOUS
  Filled 2016-07-30: qty 10

## 2016-07-30 NOTE — Progress Notes (Signed)
This AM patient's CBG was 408. MD recommended to give 15 units Novolog and 10 units Novolog MIX. Will continue to monitor.

## 2016-07-30 NOTE — Progress Notes (Signed)
Patient was complaining of pain in his right jaw and he requested pain medicine stronger then Tylenol. MD was informed and per his order, patient received Ibuprofen 800 mg with good effect. Also, MD was informed that the urine in his foley bag is becoming pink - red and per MD order a sample of urine was sent to lab. Will continue to monitor.

## 2016-07-30 NOTE — Progress Notes (Addendum)
Inpatient Diabetes Program Recommendations  AACE/ADA: New Consensus Statement on Inpatient Glycemic Control (2015)  Target Ranges:  Prepandial:   less than 140 mg/dL      Peak postprandial:   less than 180 mg/dL (1-2 hours)      Critically ill patients:  140 - 180 mg/dL   Review of Glycemic Control  Diabetes history: New diagnosis Current orders for Inpatient glycemic control: 70/30 10 units BID, Novolog Moderate TID + HS scale  A1c pending, however, admitting glucose 560 mg/dl.  Inpatient Diabetes Program Recommendations:   Consider increasing 70/30 dose to 17 unit BID due to glucose levels and patient's weight (0.2 units/kg).  Will see patient.  Thanks,  Tama Headings RN, MSN, [facility] Inpatient Diabetes Coordinator Team Pager [phone] (8a-5p)

## 2016-07-30 NOTE — Progress Notes (Addendum)
Inpatient Diabetes Program Recommendations  AACE/ADA: New Consensus Statement on Inpatient Glycemic Control (2015)  Target Ranges:  Prepandial:   less than 140 mg/dL      Peak postprandial:   less than 180 mg/dL (1-2 hours)      Critically ill patients:  140 - 180 mg/dL   Spoke with patient and mom about new diabetes diagnosis. Patient on the phone to Dr. Arelia Sneddon while this coordinator was on the phone.  Explained what an A1C is and how we diagnose DM through a random glucose value. Patient believes he can "get rid of DM" by loosing weight. Patient overwhelmed by medical problems and not wanting to cover much detailed education, however, mom is willing to learn and lives with the patient. Patient reports "she will learn everything and tell him what to do." Discussed basic pathophysiology of DM Type 2, basic home care, importance of checking CBGs and maintaining good CBG control to prevent long-term and short-term complications. Reviewed glucose and A1C goals and explained that patient will need to continue to  Reviewed signs and symptoms of hyperglycemia and hypoglycemia along with treatment for both. Discussed impact of nutrition, exercise, stress, sickness, and medications on diabetes control. Mom cooks meals for patient. Reviewed Living Well with diabetes booklet and encouraged patient to read through entire book. If patient is not able to follow up at the [facility] will need [employer] Reli On products for glucose management. Encouraged patient to go to [facility] to get the Reli-On Prime glucometer for $9 and a box of 50 Reli-On test strips for $9.  Asked patient to check his glucose at least 2 times per day and to keep a log book of glucose readings and/or take meter to follow up visit. Patient and mom to watch DM videos. Patient and mom verbalize understanding of information discussed and have no further questions at this time related to diabetes.  RNs to provide ongoing basic DM education at bedside with this  patient and engage patient to actively check blood glucose.   Patient wondering if Dr. Heber Wahkiakum will contact Dr. Arelia Sneddon (patient's PCP, however has not seen him in 3 years)  Thanks, Tama Headings RN, MSN, [place] Diabetes Coordinator Team Pager [phone] (8a-5p)

## 2016-07-30 NOTE — Progress Notes (Signed)
Transitions of Care Pharmacy Note  Plan:  Educated on:  1. MOA of insulin and metformin, insulin administration technique, s/sx hypoglycemia, hypoglycemia treatment, goals of DM care, risks of untreated DM, healthy eating and exercise habits, weight loss goals 2. MOA of amlodipine, potential ADE, benefits of treatment, risks of untreated hypertension, diet modifications 3. MOA of tamsulosin, optimal timing of medication, potential ADE 4. FDA approval process of generic medications  Addressed concerns regarding need for chronic foley Recommend changing amlodipine to ACE-I upon resolution of AKI as patient likely has underlying CKD per note, also with proteinuria.  Recommend change metformin IR to XR to avoid Gi upset.  Inpatient follow-up change to ACE-i if able, response to increased insulin dose,  Outpatient follow-up follow up resolution of AKI, change to ACE-I if able, insulin titration, DM education --------------------------------------------- Jeffery [name] is an 56 y.o. male who presents with a chief complaint urinary retention, new dx of HTN and T2DM. In anticipation of discharge, pharmacy has reviewed this patient's prior to admission medication history, as well as current inpatient medications listed per the [name].  Current medication indications, dosing, frequency, and notable side effects reviewed with patient. patient verbalized understanding of current inpatient medication regimen and is aware that the After Visit Summary when presented, will represent the most accurate medication list at discharge.   Jeffery [name] expressed concerns regarding longevity of T2DM dx (patient has "seen things on the TV where people have DM one day and then don't the next"), need for chronic foley/inability to work his current job. Also expresses concerns over cost of medication, integrity of generic medication, and giving himself shots of insulin. Of note, case manager was in room when I left to  discuss financial assistance.    Assessment: Understanding of regimen: fair Understanding of indications: fair Potential of compliance: fair Barriers to Obtaining Medications: Yes - financial, being addressed by case management   Patient instructed to contact inpatient pharmacy team with further questions or concerns if needed.    Time spent preparing for discharge counseling: 10 mins Time spent counseling patient: 45 mins   Thank you for allowing pharmacy to be a part of this patient's care.  Carlean Jews, Pharm.D. PGY1 Pharmacy Resident 4/17/20186:32 PM Pager [phone]

## 2016-07-30 NOTE — Care Management Note (Addendum)
Case Management Note  Patient Details  Name: MANAS HICKLING MRN: [id number] Date of Birth: 15-May-1960  Subjective/Objective:              Pt presents with urinary retention and AKI, found to have hyperglycemia, HTN, and tachycardia. Pt without health insurance. From home with wife.        Vivi Ferns (Mother)     [phone]       PCP: Dr. [name]   Action/Plan: CM to provide pt with [facility] information. Pt to f/u with medication needs @ the [facility]. CM to offer Match Letter if needed @ d/c.  Return to home when medically stable. CM to f/u with d/c needs.  Expected Discharge Date:                  Expected Discharge Plan:  Home/Self Care  In-House Referral:     Discharge planning Services  CM Consult  Post Acute Care Choice:    Choice offered to:     DME Arranged:    DME Agency:     HH Arranged:    HH Agency:     Status of Service:  In process, will continue to follow  If discussed at Long Length of Stay Meetings, dates discussed:    Additional Comments:  Sharin Mons, RN 07/30/2016, 1:19 PM

## 2016-07-30 NOTE — Progress Notes (Signed)
Subjective: Currently, the patient is feeling much improved in terms of abd fullness. Had large volume UOP ON w/ foley. BP under better control w/ amlodipine, still mildly tachycardic in NSR. BG still under controlled, pt was having regular sugar soda ON.  Objective: Vital signs in last 24 hours: Vitals:   07/29/16 1800 07/29/16 1853 07/29/16 2258 07/30/16 0642  BP:  (!) 142/95 (!) 123/92 112/66  Pulse: (!) 113 (!) 122 (!) 119 (!) 115  Resp: 19 20 17 18   Temp:  98.8 F (37.1 C) 98.7 F (37.1 C) 98.6 F (37 C)  TempSrc:  Oral Oral   SpO2: 92% 94% 92% 92%  Weight:    254 lb 4.8 oz (115.3 kg)   Intake/Output:  04/16 0701 - 04/17 0700 In: 2119 [P.O.:1340] Out: 41740 [Urine:14050]    Physical Exam: Physical Exam  Constitutional: No distress.  Obese belly, NAD  Cardiovascular: Regular rhythm and normal heart sounds.  Tachycardia present.   Pulmonary/Chest: Effort normal and breath sounds normal.  Abdominal: Soft. Bowel sounds are normal. There is no tenderness.  Musculoskeletal: He exhibits no edema.   Labs: CBC:  Recent Labs Lab 07/29/16 1240 07/30/16 0603  WBC 13.9* 10.0  NEUTROABS 10.7*  --   HGB 14.8 13.9  HCT 44.8 42.1  MCV 86.2 87.2  PLT 814 481   Metabolic Panel:  Recent Labs Lab 07/29/16 1240 07/30/16 0603  NA 138 143  K 3.9 3.5  CL 99* 107  CO2 27 27  GLUCOSE 560* 373*  BUN 26* 18  CREATININE 2.51* 1.62*  CALCIUM 9.8 9.1  ALT 32 26  ALKPHOS 103 84  BILITOT 0.2* 0.6  PROT 7.2 6.8  ALBUMIN 3.5 3.0*   BG:  Recent Labs Lab 07/29/16 1533 07/29/16 1701 07/29/16 2108 07/30/16 0751 07/30/16 1247  GLUCAP 452* 446* 354* 408* 230*    Lab Results  Component Value Date   HGBA1C >15.5 (H) 07/29/2016     Medications: Infusions: . sodium chloride 500 mL (07/30/16 1324)   Scheduled Medications: . amLODipine  10 mg Oral Daily  . heparin  5,000 Units Subcutaneous Q8H  . ibuprofen  800 mg Oral Once  . insulin aspart  0-15 Units Subcutaneous  TID WC  . insulin aspart  0-5 Units Subcutaneous QHS  . insulin aspart protamine- aspart  17 Units Subcutaneous BID WC  . nystatin   Topical TID  . tamsulosin  0.4 mg Oral QPC supper   PRN Medications: acetaminophen **OR** acetaminophen, senna-docusate  Assessment/Plan: Pt is a 56 y.o. male w/o known PMH who presents with urinary retention and AKI, found to have hyperglycemia, HTN, and tachycardia.  Urinary Retention:Significantly improved today s/p >14L diuresis ON. Maintain foley, needs Urology f/u after DC for continued foley management and w/u of BPH vs concern of prostate CA. - maintain foley - outpt f/u with Urology - continue tamsulosin 0.4mg  - control bladder spasms PRN morphine  Hyperglycemia: Pt still w/ poor control. Pt with poor health-literacy. Education provided. Will increase to 17U BID 70/30 insulin + SSI-m. Continue to titrate. A1c > 15.5%. Add 500mg  metformin daily.  AKI: SCr trending down, now 1.6, continue IVF and foley. Likely with underlying CKD 2/2 chronic DM and HTN. Continue to follow outpt.  HTN: Now normotensive, will continue amlodipine 10mg .  Constipation: Suspect that this may be 2/2 overly distended bladder. Diverticuli and stool noted on CT. Will treat with Senna-docusate x2 qHS.  Tachycardia: NSR. No palpitations or CP. Persistent in spite of diuresis and  fluid resuscitation. Could be autonomic neuropathy from DM w/ concerns of neurogenic bladder, but no other peripheral neuropathy sx. Continue to monitor. May add BB for symptomatic relief if necessary.  Candidal infection of groin: Improving. Continue Nystatin powder.  Length of Stay: 0 day(s) Dispo: Anticipated discharge in 1-2 days after BG control.  Holley Raring, MD Pager: [phone] (7AM-5PM) 07/30/2016, 1:56 PM

## 2016-07-31 ENCOUNTER — Encounter (HOSPITAL_COMMUNITY): Payer: Self-pay | Admitting: General Practice

## 2016-07-31 DIAGNOSIS — R000 Tachycardia, unspecified: Secondary | ICD-10-CM

## 2016-07-31 DIAGNOSIS — R339 Retention of urine, unspecified: Secondary | ICD-10-CM

## 2016-07-31 DIAGNOSIS — Z794 Long term (current) use of insulin: Secondary | ICD-10-CM

## 2016-07-31 DIAGNOSIS — E1165 Type 2 diabetes mellitus with hyperglycemia: Principal | ICD-10-CM

## 2016-07-31 DIAGNOSIS — N179 Acute kidney failure, unspecified: Secondary | ICD-10-CM

## 2016-07-31 DIAGNOSIS — B3749 Other urogenital candidiasis: Secondary | ICD-10-CM

## 2016-07-31 DIAGNOSIS — I10 Essential (primary) hypertension: Secondary | ICD-10-CM

## 2016-07-31 DIAGNOSIS — Z960 Presence of urogenital implants: Secondary | ICD-10-CM

## 2016-07-31 DIAGNOSIS — K5900 Constipation, unspecified: Secondary | ICD-10-CM

## 2016-07-31 LAB — BASIC METABOLIC PANEL
Anion gap: 10 (ref 5–15)
BUN: 14 mg/dL (ref 6–20)
CALCIUM: 8.1 mg/dL — AB (ref 8.9–10.3)
CO2: 26 mmol/L (ref 22–32)
CREATININE: 1.42 mg/dL — AB (ref 0.61–1.24)
Chloride: 103 mmol/L (ref 101–111)
GFR calc Af Amer: >60 mL/min (ref >60)
GFR, EST NON AFRICAN AMERICAN: 55 mL/min — AB (ref >60)
Glucose, Bld: 208 mg/dL — ABNORMAL HIGH (ref 65–99)
Potassium: 3.4 mmol/L — ABNORMAL LOW (ref 3.5–5.1)
SODIUM: 139 mmol/L (ref 135–145)

## 2016-07-31 LAB — GLUCOSE, CAPILLARY
GLUCOSE-CAPILLARY: 187 mg/dL — AB (ref 65–99)
GLUCOSE-CAPILLARY: 264 mg/dL — AB (ref 65–99)
GLUCOSE-CAPILLARY: 233 mg/dL — AB (ref 65–99)
GLUCOSE-CAPILLARY: 197 mg/dL — AB (ref 65–99)

## 2016-07-31 MED ORDER — INSULIN ASPART PROT & ASPART (70-30 MIX) 100 UNIT/ML ~~LOC~~ SUSP: 20.0000 [IU] | Freq: Two times a day (BID) | SUBCUTANEOUS | 11 refills | Status: DC

## 2016-07-31 MED ORDER — LISINOPRIL 10 MG PO TABS: 10.0000 mg | ORAL_TABLET | Freq: Every day | ORAL | 0 refills | Status: DC

## 2016-07-31 MED ORDER — "INSULIN SYRINGE-NEEDLE U-100 31G X 15/64"" 0.3 ML MISC": 12 refills | Status: DC

## 2016-07-31 MED ORDER — METFORMIN HCL ER 500 MG PO TB24
500.0000 mg | ORAL_TABLET | Freq: Every day | ORAL | Status: DC
  Administered 2016-08-01: 500 mg via ORAL
  Filled 2016-07-31: qty 1

## 2016-07-31 MED ORDER — RELION CONFIRM GLUCOSE MONITOR W/DEVICE KIT: PACK | 0 refills | Status: AC

## 2016-07-31 MED ORDER — METFORMIN HCL ER 500 MG PO TB24: ORAL_TABLET | ORAL | 0 refills | Status: DC

## 2016-07-31 MED ORDER — INSULIN STARTER KIT- SYRINGES (ENGLISH)
1.0000 | Freq: Once | Status: AC
Start: 2016-07-31 — End: 2016-07-31
  Administered 2016-07-31: 1
  Filled 2016-07-31: qty 1

## 2016-07-31 MED ORDER — GLUCOSE BLOOD VI STRP: ORAL_STRIP | 12 refills | Status: AC

## 2016-07-31 MED ORDER — LISINOPRIL 10 MG PO TABS
10.0000 mg | ORAL_TABLET | Freq: Every day | ORAL | Status: DC
  Administered 2016-07-31 – 2016-08-01 (×2): 10 mg via ORAL
  Filled 2016-07-31 (×2): qty 1

## 2016-07-31 MED ORDER — RELION CONFIRM GLUCOSE MONITOR W/DEVICE KIT: PACK | 0 refills | Status: DC

## 2016-07-31 MED ORDER — LIVING WELL WITH DIABETES BOOK
Freq: Once | Status: AC
  Administered 2016-07-31: 15:00:00
  Filled 2016-07-31: qty 1

## 2016-07-31 MED ORDER — GLUCOSE BLOOD VI STRP: ORAL_STRIP | 12 refills | Status: DC

## 2016-07-31 MED ORDER — METFORMIN HCL ER 500 MG PO TB24
1000.0000 mg | ORAL_TABLET | Freq: Every day | ORAL | Status: DC
  Filled 2016-07-31: qty 2

## 2016-07-31 MED ORDER — LANCETS 30G MISC: 0 refills | Status: AC

## 2016-07-31 MED ORDER — INSULIN ASPART PROT & ASPART (70-30 MIX) 100 UNIT/ML ~~LOC~~ SUSP
20.0000 [IU] | Freq: Two times a day (BID) | SUBCUTANEOUS | Status: DC
  Administered 2016-07-31 – 2016-08-01 (×2): 20 [IU] via SUBCUTANEOUS
  Filled 2016-07-31: qty 10

## 2016-07-31 MED ORDER — LANCETS 30G MISC: 0 refills | Status: DC

## 2016-07-31 MED ORDER — TAMSULOSIN HCL 0.4 MG PO CAPS: 0.4000 mg | ORAL_CAPSULE | Freq: Every day | ORAL | 0 refills | Status: DC

## 2016-07-31 MED ORDER — INSULIN SYRINGE-NEEDLE U-100 31G X 15/64" 0.3 ML MISC
12 refills | Status: DC
Start: 2016-07-31 — End: 2016-07-31

## 2016-07-31 NOTE — Care Management Note (Addendum)
Case Management Note  Patient Details  Name: MYRON STANKOVICH MRN: [id number] Date of Birth: 10-Jan-1961  Subjective/Objective:   New onset DM, Urinary retention, AKI                 Action/Plan: Discharge Planning: Spoke to [facility] for price of medications total $420. CM spoke to attending aware pt's insulin in $360.  Gave order to have insulin changed to Relion 70/30 [employer] brand for $25.00. NCM spoke to pt and reports he will be out of work for a short period of time. Will utilize [facility] for medications and have pt pick up at the [place].   PCP [name] MD  Expected Discharge Date:  07/31/16               Expected Discharge Plan:  Home/Self Care  In-House Referral:  NA  Discharge planning Services  CM Consult, Medication Assistance  Post Acute Care Choice:  NA Choice offered to:  NA  DME Arranged:  N/A DME Agency:  NA  HH Arranged:  NA HH Agency:  NA  Status of Service:  Completed, signed off  If discussed at [place] of Stay Meetings, dates discussed:    Additional Comments:  Erenest Rasher, RN 07/31/2016, 12:42 PM

## 2016-07-31 NOTE — Progress Notes (Signed)
Patient's discharge cancelled.  Complains of lower abd pain, prn med given. Marcille Blanco, RN

## 2016-07-31 NOTE — Discharge Summary (Signed)
Name: Jeffery [name] MRN: [id number] DOB: [birth year] 56 y.o. PCP: [name], MD  Date of Admission: 07/29/2016 10:47 AM Date of Discharge: 08/01/2016 Attending Physician: Lucious Groves, DO  Discharge Diagnosis: Principal Problem:   Diabetes mellitus, new onset ([place]) Active Problems:   Hyperglycemia   Urinary retention   AKI (acute kidney injury) ([place])   Cholelithiasis   Fatty liver   Hydronephrosis   Hypertension   Sinus tachycardia  Discharge Medications: Allergies as of 08/01/2016   No Known Allergies     Medication List    TAKE these medications   glucose blood test strip Commonly known as:  RELION GLUCOSE TEST STRIPS Use as instructed   insulin aspart protamine- aspart (70-30) 100 UNIT/ML injection Commonly known as:  NOVOLOG MIX 70/30 Inject 0.2 mLs (20 Units total) into the skin 2 (two) times daily with a meal.   Insulin Syringe-Needle U-100 31G X 15/64" 0.3 ML Misc The patient is insulin requiring, ICD 10 code E10.9. The patient tests 4 times per day.   Lancets 30G Misc Use 4 times daily before meals as directed.   lisinopril 10 MG tablet Commonly known as:  PRINIVIL,ZESTRIL Take 1 tablet (10 mg total) by mouth daily.   metFORMIN 500 MG 24 hr tablet Commonly known as:  GLUCOPHAGE-XR Take 1 tablet (563m) daily for the next 4 days, then begin taking 1 tablet (501m two times every day (for a total of 100084maily).   RELION CONFIRM GLUCOSE MONITOR w/Device Kit Use 4 times daily before meals and bedtime as directed.   tamsulosin 0.4 MG Caps capsule Commonly known as:  FLOMAX Take 1 capsule (0.4 mg total) by mouth daily after supper.       Disposition and follow-up:   Jeffery [name] [facility].  At the hospital follow up visit please address:  1.  New diagnosis, type 2 Diabetes Mellitus: Assess understanding of DM, BG testing and insulin administration. Assess  tolerance of Metformin. Titrate insult as appropriate. Continue counsel on diet and lifestyle modifications. New diagnosis, HTN, proteinuria, concern for CKD: Assess access and compliance to lisinopril. Ensure no adverse effects given family h/o angioedema 2/2 ACEi. Recheck BMP for renal function and urine microalbumin. Urinary Retention: Ensure follow up with Urology. Consider further w/u for BPH vs Prostate malignancy given family hx in conjunction with Urology. Ensure proper foley care.  2.  Labs / imaging needed at time of follow-up: BMP, urine microalbumin  Follow-up Appointments: Follow-up Information    ELKLeonard DowningD. Go on 08/05/2016.   Specialty:  Family Medicine Why:  Appointment at 11:00am for hospital follow up. Contact information: [id number] [id number] on 08/15/2016.   Why:  Appointment at 2:00pm to follow up for your foley catheter and urinary retention. Contact information: [id number]      Hospital Course by problem list: Principal Problem:   Diabetes mellitus, new onset ([place] Problems:   Hyperglycemia   Urinary retention   AKI (acute kidney injury) ([place] Cholelithiasis   Fatty liver   Hydronephrosis   Hypertension   Sinus tachycardia   1. New diagnosis, type 2 diabetes mellitus: On presentation, patient was found to have elevated blood glucose of 560. He had reported a history of polydipsia over the last several years  and had had frequent urination which was complicated by urinary retention. Patient's hemoglobin A1c was found to be greater than 15.5%. His blood sugars were managed with insulin and the patient was counseled about diabetes, proper diet, blood glucose monitoring and insulin administration. He was started on 70/30 NovoLog insulin administered twice a day, as well as extended release metformin 500  mg once daily for 4 days with plans increased to 1000 mg daily as tolerated according to GI side effects. Patient will have PCP follow-up with Dr. Claris Gower who is seen irregularly in the past. Appointment scheduled at discharge.  2. New diagnosis, hypertension: Patient presented with elevated blood pressures in the 150s-160s/90s-100s. Patient had previously been treated for high blood pressure. He was started on amlodipine 10 mg with good effect. He was found to have proteinuria and given his diagnosis of diabetes, he was transitioned from amlodipine to 10 mg lisinopril at time of discharge.  3. AKI on suspected CKD: Patient has presented with a saline creatinine 2.51. This is likely multifactorial in the setting of significant urinary retention and obstructive nephropathy. Patient's obstruction was alleviated with Foley catheter, patient was given IV fluid to assist renal perfusion, and achieved good diuresis. The patient's creatinine trended down to 1.4 at the time of discharge. Urinalysis showed significant proteinuria and given the patient's diabetes and hypertension which we suspect have been chronic problems, we are concerned for underlying CKG. Patient was started on lisinopril for blood pressure control at time of discharge which will have renal protective effects given his proteinuria. He should continue to follow-up with his PCP and have repeat BMP and microalbumin done to assess for proteinuria.  4. Urinary Retention: Patient initially presented for complaints of urinary hesitancy, urgent continence, and most recently overflow incontinence. This problem was chronic lasting several years since 2016 but had recently been worsening over the last month. By the time of his presentation to emergency department patient was unable to fall sleep without having incontinence and had been urinating every 30 minutes. Patient's bladder was decompressed with insertion of Foley catheter and had 4 L of urine  output initially. CT scan abdomen and pelvis revealed bladder wall thickening and distention in spite of Foley decompression, and bilateral mild hydronephrosis, as well as inflammatory changes around the left kidney. No obstructing stones were noted. Patient's prostate appeared mildly enlarged and was asymmetric on imaging as well as demonstrating some dystrophic calcification. Patient was discharged with an indwelling Foley catheter to urology follow-up for further management of his Foley and evaluation of his urinary retention. Differential diagnosis included BPH as well as prostate cancer as the patient does have a family history in his father. Is also some concern for neurogenic bladder given the patient's uncontrolled diabetes, however patient does not have other obvious signs of diabetic nerve damage. Patient was started on tamsulosin which was continued at discharge.  5. Sinus tachycardia: Patient had sinus tachycardia on presentation which persisted in spite of treatment of his urinary retention and abdominal pain. EKG revealed no significant abnormalities and the patient remained in sinus rhythm. His tachycardia was persistent in the low 100s to 110s. Patient was asymptomatic from this without palpitations. Patient denied any chest pain, shortness of breath. Etiology of his tachycardia is currently not determined, although it may be related to autonomic neuropathy in the setting of long-standing uncontrolled diabetes. We recommend further workup if this problems worsen, and the patient may require beta blockade if he becomes symptomatic.  Discharge Vitals:  BP (!) 96/53 (BP Location: Left Arm)   Pulse (!) 105   Temp 98.5 F (36.9 C) (Oral)   Resp 16   Wt 254 lb 6.4 oz (115.4 kg)   SpO2 97%   Pertinent Labs, Studies, and Procedures:  Procedures Performed:  Ct Renal Stone Study  Result Date: 07/29/2016 CLINICAL DATA:  Left lower quadrant pain an urinary retention EXAM: CT ABDOMEN AND  PELVIS WITHOUT CONTRAST TECHNIQUE: Multidetector CT imaging of the abdomen and pelvis was performed following the standard protocol without IV contrast. COMPARISON:  None. FINDINGS: Lower chest: No acute abnormality. Hepatobiliary: The liver is diffusely fatty infiltrated. The gallbladder is well distended. A single gallstone is noted in the region of the gallbladder neck Pancreas: Unremarkable. No pancreatic ductal dilatation or surrounding inflammatory changes. Spleen: Normal in size without focal abnormality. Adrenals/Urinary Tract: The adrenal glands are within normal limits. The left kidney demonstrates mild hydronephrosis and hydroureter which extends to the level of the ureterovesical junction. Similar changes are noted on the right although the degree of inflammatory change is less on the right. Bladder is well distended although a Foley catheter is noted within. Some bladder wall thickening is noted as well. Stomach/Bowel: Scattered diverticular change of the colon is noted without diverticulitis. The appendix is within normal limits. Vascular/Lymphatic: No significant vascular findings are present. No enlarged abdominal or pelvic lymph nodes. Reproductive: Prostate is unremarkable. Other: No abdominal wall hernia or abnormality. No abdominopelvic ascites. Musculoskeletal: No acute or significant osseous findings. IMPRESSION: Mild fullness of the renal collecting systems and ureters bilaterally slightly greater on the left than the right with some inflammatory changes surrounding the left kidney and left renal pelvis. No definitive obstructing stone is seen. Well distended bladder with Foley catheter in place. Some bladder wall thickening is noted. This may contribute to the degree of collecting system dilatation. Diverticular change without diverticulitis. Cholelithiasis Electronically Signed   By: Inez Catalina M.D.   On: 07/29/2016 13:23   Discharge Instructions: Discharge Instructions    Call MD for:   difficulty breathing, headache or visual disturbances    Complete by:  As directed    Call MD for:  extreme fatigue    Complete by:  As directed    Call MD for:  persistant dizziness or light-headedness    Complete by:  As directed    Call MD for:  persistant nausea and vomiting    Complete by:  As directed    Diet - low sodium heart healthy    Complete by:  As directed    Discharge instructions    Complete by:  As directed    You have urinary retention which has been relieved by a catheter in your bladder. It will be important to keep this catheter in place for several weeks to let the bladder rest. We have scheduled a follow up appointment with Urology which you should attend to have this problem evaluated further. Please continue to take the Flomax (tamsulosin) medication.  Your blood sugars were also elevated. This is called Diabetes Mellitus. It will be very important to control your blood sugar and to treat your diabetes to prevent problems such as kidney failure, heart attacks, strokes, and blindness. We have started you on two medications. 1) Metformin: please take this medication every day, at first one time daily, then after 4 days take it two times daily. This may cause abdominal discomfort and loose stools initially, but should improve after several days on this medication. 2) Insulin:  you will inject this two times every day before breakfast and dinner. It will be important to measure your blood sugar with your glucose meter and test strips before each meal and bedtime. If you notice that your blood sugar is too low (<60) or that your feel poorly, please eat something sugary to bring up your blood sugar and let your doctor know. It will be very important to follow up with Dr. Welton Flakes to continue to monitor this.  Finally, we have started you a medication for high blood pressure called lisinopril. This will also help protect your kidneys from damage related to the diabetes. Please take  this medication every day.   Increase activity slowly    Complete by:  As directed      Signed: Holley Raring, MD 08/01/2016, 12:17 PM   Pager: [phone]

## 2016-07-31 NOTE — Progress Notes (Signed)
Patient states he his foley catheter got caught around the toilet seat as he was using the bathroom.  Patient states he experienced sharp pain while standing up after using the bathroom.  Urine in the bag and along the tubing appears pink with small blood clots.  Patient already has discharge orders, states he is concerned about going home with bloody urine.  MD paged. Marcille Blanco, RN

## 2016-07-31 NOTE — Progress Notes (Addendum)
Subjective: Currently, the patient is feeling improved. He reports he was able to have a bowel movement yesterday which was initially hard and required straining but yesterday evening had a loose stool. He was on the toilet this morning having good success. Patient had mild amount of trauma to his Foley yesterday with movement around his room. He had some mild thickening of his urine and he was counseled on caution. Patient also complains that the lesion on his penis has ruptured and is mildly painful. He requests me examine this today.  Objective: Vital signs in last 24 hours: Vitals:   07/30/16 1529 07/30/16 2216 07/31/16 0300 07/31/16 0527  BP: 111/60 95/63  117/76  Pulse: 87 (!) 102  (!) 113  Resp: 18     Temp: 98.9 F (37.2 C) 98.4 F (36.9 C)  97.9 F (36.6 C)  TempSrc: Oral Oral  Oral  SpO2: 91% 93%  99%  Weight:   251 lb 15.8 oz (114.3 kg)    Intake/Output:  04/17 0701 - 04/18 0700 In: 1168.3 [P.O.:200; I.V.:968.3] Out: 8250 [Urine:8250]    Physical Exam: Physical Exam  Constitutional: No distress.  Obese belly, NAD  Cardiovascular: Regular rhythm and normal heart sounds.  Tachycardia present.   Pulmonary/Chest: Effort normal and breath sounds normal.  Abdominal: Soft. Bowel sounds are normal. There is no tenderness.  Genitourinary:  Genitourinary Comments: Candidal infection, resolving  Musculoskeletal: He exhibits no edema.   Labs: CBC:  Recent Labs Lab 07/29/16 1240 07/30/16 0603  WBC 13.9* 10.0  NEUTROABS 10.7*  --   HGB 14.8 13.9  HCT 44.8 42.1  MCV 86.2 87.2  PLT 347 425   Metabolic Panel:  Recent Labs Lab 07/29/16 1240 07/30/16 0603 07/31/16 0617  NA 138 143 139  K 3.9 3.5 3.4*  CL 99* 107 103  CO2 27 27 26   GLUCOSE 560* 373* 208*  BUN 26* 18 14  CREATININE 2.51* 1.62* 1.42*  CALCIUM 9.8 9.1 8.1*  ALT 32 26  --   ALKPHOS 103 84  --   BILITOT 0.2* 0.6  --   PROT 7.2 6.8  --   ALBUMIN 3.5 3.0*  --    BG:  Recent Labs Lab  07/30/16 0751 07/30/16 1247 07/30/16 1717 07/30/16 2105 07/31/16 0751  GLUCAP 408* 230* 324* 207* 187*    Lab Results  Component Value Date   HGBA1C >15.5 (H) 07/29/2016     Medications: Infusions: . sodium chloride 100 mL/hr at 07/30/16 1845   Scheduled Medications: . amLODipine  10 mg Oral Daily  . heparin  5,000 Units Subcutaneous Q8H  . insulin aspart  0-15 Units Subcutaneous TID WC  . insulin aspart  0-5 Units Subcutaneous QHS  . insulin aspart protamine- aspart  17 Units Subcutaneous BID WC  . metFORMIN  500 mg Oral Q breakfast  . nystatin   Topical TID  . tamsulosin  0.4 mg Oral QPC supper   PRN Medications: acetaminophen **OR** acetaminophen, senna-docusate  Assessment/Plan: Pt is a 56 y.o. male w/o known PMH who presents with urinary retention and AKI, found to have hyperglycemia, HTN, and tachycardia.  Urinary Retention: Significantly improved today s/p >19L diuresis since admission. Maintain foley, needs Urology f/u after DC for continued foley management and w/u of BPH vs concern of prostate CA. Had traumatic injury with interference of foley, urine was pink and UA shows moderate blood. Pt counseled on concern for urethral damage. - maintain foley - outpt f/u with Urology - continue tamsulosin 0.4mg   Hyperglycemia: CBGs improved to 187 this AM. Pt with poor health-literacy. Education provided. Continue 17U BID 70/30 insulin + SSI-m. Titrate to 500mg  XR metformin daily today.  AKI on likely CKD: Obstructive nephropathy from urinary retention. SCr trending down, now 1.4, likely underlying CKD from HTN/DM, with proteinuria on UA. Will add lisinopril 10mg  to BP control. Continue to follow outpt.  HTN: Now normotensive, switch amlodipine 10mg  to lisinopril 10mg  for DM.  Constipation: Suspect that this may be 2/2 overly distended bladder. Diverticuli and stool noted on CT. Will treat with Senna-docusate x2 qHS.  Tachycardia: NSR. No palpitations or CP.  Persistent in spite of diuresis and fluid resuscitation. Could be autonomic neuropathy from DM w/ concerns of neurogenic bladder, but no other peripheral neuropathy sx. Continue to monitor. May add BB for symptomatic relief if necessary.  Candidal infection of groin: Improving. Continue Nystatin powder.  Length of Stay: 1 day(s) Dispo: Anticipated discharge in 1-2 days after BG control.  Holley Raring, MD Pager: [phone] (7AM-5PM) 07/31/2016, 8:11 AM

## 2016-07-31 NOTE — Progress Notes (Signed)
Patient bladder irrigated and no clots found.

## 2016-07-31 NOTE — Progress Notes (Signed)
Note that patient has new onset diabetes.  He was visited yesterday by diabetes coordinator, however did not watch videos or practice insulin administration as instructed.  Re- educated on hypoglycemia signs and symptoms, treatment, safety and insulin type.  Assisted patient in watching diabetes videos on pt. Education network.  Mother present in room.  TO practice drawing up insulin with bedside RN.  Needs very close follow-up with PCP.  Student RN also to re- review survival skills with patient.  Patient seems very overwhelmed.  He does live with his mother who will be assisting him.  Thanks, Adah Perl, RN, BC-ADM Inpatient Diabetes Coordinator Pager [phone] (8a-5p)

## 2016-08-01 DIAGNOSIS — K8020 Calculus of gallbladder without cholecystitis without obstruction: Secondary | ICD-10-CM

## 2016-08-01 DIAGNOSIS — K760 Fatty (change of) liver, not elsewhere classified: Secondary | ICD-10-CM

## 2016-08-01 DIAGNOSIS — N1330 Unspecified hydronephrosis: Secondary | ICD-10-CM

## 2016-08-01 LAB — MICROALBUMIN / CREATININE URINE RATIO
Creatinine, Urine: 22.6 mg/dL
MICROALB UR: 3133.6 ug/mL — AB
MICROALB/CREAT RATIO: 13865.5 mg/g{creat} — AB (ref 0.0–30.0)

## 2016-08-01 LAB — GLUCOSE, CAPILLARY
Glucose-Capillary: 163 mg/dL — ABNORMAL HIGH (ref 65–99)
Glucose-Capillary: 224 mg/dL — ABNORMAL HIGH (ref 65–99)

## 2016-08-01 NOTE — Progress Notes (Signed)
Patient needed more teaching about diabetes diagnosis and management due to anxiety over new diagnosis. Patient stated, "even though they told me what to do, I still forget what they said." I explained to him what diabetes mellitus does to the consistency of his blood because of high blood sugar levels and how it had affected his kidneys and could affect his blood vessels. I explained how the body uses insulin as well as the importance of eating regularly and checking his blood sugar. His mother was receptive and encouraged him that he would learn to handle this new twist in his life.  Emerson Monte, student nurse

## 2016-08-01 NOTE — Progress Notes (Signed)
Changed urinary catheter bag over to a leg bag. Educated patient about how to empty and change bag to foley drainage bag at night. Verbalized understanding.   Emerson Monte, student nurse

## 2016-08-01 NOTE — Care Management Note (Signed)
Case Management Note Previous CM note initiated by Erenest Rasher, RN 07/31/2016, 12:42 PM   Patient Details  Name: REECE FEHNEL MRN: [id number] Date of Birth: [birth year]  Subjective/Objective:   New onset DM, Urinary retention, AKI                 Action/Plan: Discharge Planning: Spoke to [facility] for price of medications total $420. CM spoke to attending aware pt's insulin in $360.  Gave order to have insulin changed to Relion 70/30 [employer] brand for $25.00. NCM spoke to pt and reports he will be out of work for a short period of time. Will utilize [facility] for medications and have pt pick up at the [facility].   PCP [name] MD  Expected Discharge Date:  08/01/16               Expected Discharge Plan:  Home/Self Care  In-House Referral:  NA  Discharge planning Services  CM Consult, Medication Assistance  Post Acute Care Choice:  NA Choice offered to:  NA  DME Arranged:  N/A DME Agency:  NA  HH Arranged:  NA HH Agency:  NA  Status of Service:  Completed, signed off  If discussed at [place] of Stay Meetings, dates discussed:    Additional Comments:  08/01/16- 1145- Peirce Deveney RN, CM- pt for d/c home today- spoke with pt via TC- confirmed with pt that he had [place] letter and list of pharmacies - reviewed [facility] program and cost of medications $3 per script. No further CM needs noted for discharge.   Dahlia Client [place], RN 08/01/2016, 11:49 AM [phone]

## 2016-08-01 NOTE — Progress Notes (Signed)
Subjective: Pt concerned about picking up medications yesterday and requesting more education about insulin administration and did not discharge yesterday. Feels more comfortable with insulin administration today.  Had foley cath caught on toilet seat which pulled and caused some gross hematuria, bladder irrigated and urine yellow with slight pink this AM, good output. Will discharge as previously planned to outpt f/u PCP and urology.  Objective: Vital signs in last 24 hours: Vitals:   07/31/16 2135 08/01/16 0529 08/01/16 0855 08/01/16 0900  BP: 97/60 (!) 88/60 94/61 (!) 96/53  Pulse: (!) 109 (!) 106 (!) 101 (!) 105  Resp: 18 16    Temp: 98.7 F (37.1 C) 98.5 F (36.9 C) 98.5 F (36.9 C)   TempSrc: Oral  Oral   SpO2: 95% 94% 97%   Weight:  254 lb 6.4 oz (115.4 kg)     Intake/Output:  04/18 0701 - 04/19 0700 In: 1260 [P.O.:960; I.V.:300] Out: 5075 [Urine:5075]    Physical Exam: Physical Exam  Constitutional: No distress.  Obese belly, NAD  Cardiovascular: Regular rhythm and normal heart sounds.  Tachycardia present.   Pulmonary/Chest: Effort normal and breath sounds normal.  Abdominal: Soft. Bowel sounds are normal. There is no tenderness.  Genitourinary:  Genitourinary Comments: Candidal infection, resolving  Musculoskeletal: He exhibits no edema.   Labs: CBC:  Recent Labs Lab 07/29/16 1240 07/30/16 0603  WBC 13.9* 10.0  NEUTROABS 10.7*  --   HGB 14.8 13.9  HCT 44.8 42.1  MCV 86.2 87.2  PLT 993 716   Metabolic Panel:  Recent Labs Lab 07/29/16 1240 07/30/16 0603 07/31/16 0617  NA 138 143 139  K 3.9 3.5 3.4*  CL 99* 107 103  CO2 27 27 26   GLUCOSE 560* 373* 208*  BUN 26* 18 14  CREATININE 2.51* 1.62* 1.42*  CALCIUM 9.8 9.1 8.1*  ALT 32 26  --   ALKPHOS 103 84  --   BILITOT 0.2* 0.6  --   PROT 7.2 6.8  --   ALBUMIN 3.5 3.0*  --    BG:  Recent Labs Lab 07/31/16 0751 07/31/16 1125 07/31/16 1751 07/31/16 2129 08/01/16 0815  GLUCAP 187* 264*  233* 197* 163*    Lab Results  Component Value Date   HGBA1C >15.5 (H) 07/29/2016     Medications: Infusions: . sodium chloride 100 mL/hr at 07/30/16 1845   Scheduled Medications: . heparin  5,000 Units Subcutaneous Q8H  . insulin aspart  0-15 Units Subcutaneous TID WC  . insulin aspart  0-5 Units Subcutaneous QHS  . insulin aspart protamine- aspart  20 Units Subcutaneous BID WC  . lisinopril  10 mg Oral Daily  . metFORMIN  500 mg Oral Q breakfast  . nystatin   Topical TID  . tamsulosin  0.4 mg Oral QPC supper   PRN Medications: acetaminophen **OR** acetaminophen, senna-docusate  Assessment/Plan: Pt is a 56 y.o. male w/o known PMH who presents with urinary retention and AKI, found to have hyperglycemia, HTN, and tachycardia.  Urinary Retention: indwelling foley. Had trauma, but hematuria resolving - maintain foley - outpt f/u with Urology - continue tamsulosin 0.4mg   Hyperglycemia: CBGs improved. Pt with poor health-literacy. Education provided. Continue 20U BID 70/30 insulin. Titrate to 500mg  XR metformin daily then 1000mg  in several days.  AKI on likely CKD: Obstructive nephropathy from urinary retention. SCr trending down, likely underlying CKD from HTN/DM, with proteinuria on UA. Lisinopril 10mg  for proteinuria/BP. Continue to follow outpt.  HTN: Now normotensive, continue lisinopril 10mg  for DM.  Tachycardia: NSR. No palpitations or CP. Persistent in spite of diuresis and fluid resuscitation. Could be autonomic neuropathy from DM w/ concerns of neurogenic bladder, but no other peripheral neuropathy sx. Continue to monitor. May add BB for symptomatic relief if necessary.  Candidal infection of groin: Improving. Continue Nystatin powder. Keep clean dry.  Length of Stay: 2 day(s) Dispo: Anticipated discharge today  Holley Raring, MD Pager: [phone] (7AM-5PM) 08/01/2016, 11:01 AM

## 2016-08-01 NOTE — Progress Notes (Signed)
Pt ready for discharge to home. Pt. Is alert and oriented. Pt is hemodynamically stable. AVS reviewed with pt. Capable of re verbalizing medication regimen. Discharge plan appropriate and in place. 

## 2016-08-01 NOTE — Progress Notes (Signed)
Follow-up visit made with patient/mother.  He states that he was able to give his own insulin injection this morning.  He watched diabetes videos on 4/18- He has written materials as well regarding diabetes.  Briefly discussed diet with patient and good choices.  Will need close follow-up with PCP.   Thanks, Adah Perl, RN, BC-ADM Inpatient Diabetes Coordinator Pager [phone] (8a-5p)

## 2017-10-14 IMAGING — US RENAL/URINARY TRACT ULTRASOUND
1 series · 13 of 25 positions shown · non-contrast
Comparison: Prior CT abdomen/pelvis [DATE]

CLINICAL DATA: 57-year-old male with acute kidney injury

EXAM:
RENAL / URINARY TRACT ULTRASOUND COMPLETE

[Series 1: renal/urinary tract ultrasound · 0.26mm/px · 13 of 63 slices shown]
[im 1/63]
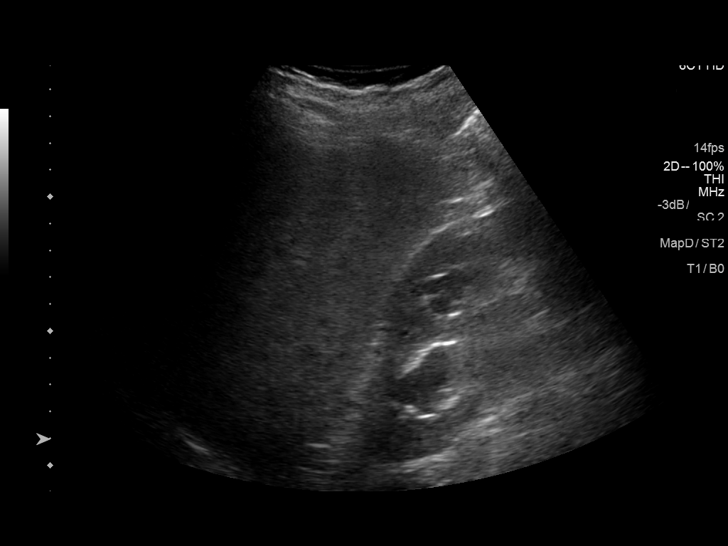
[im 6/63]
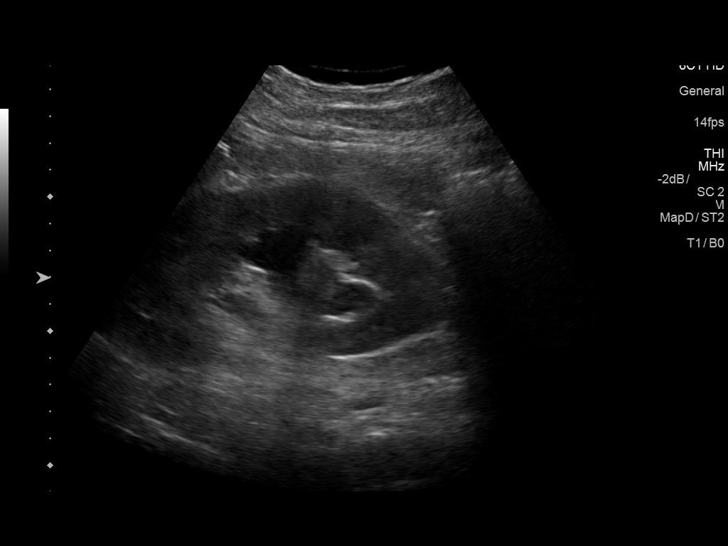
[im 11/63]
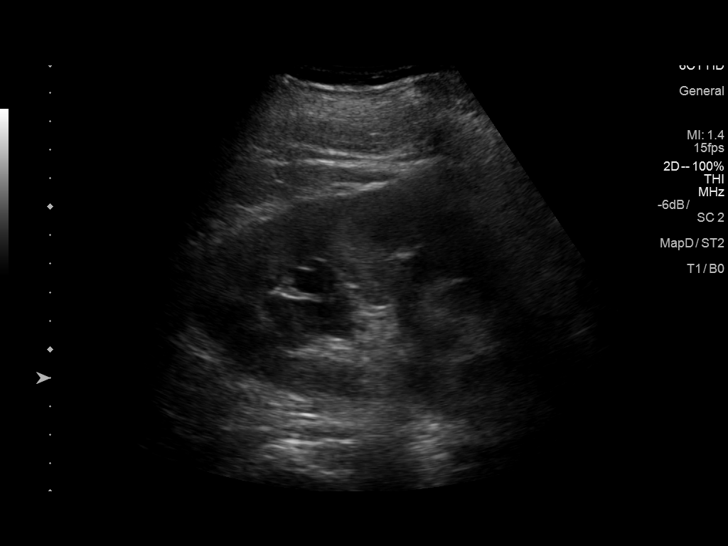
[im 16/63]
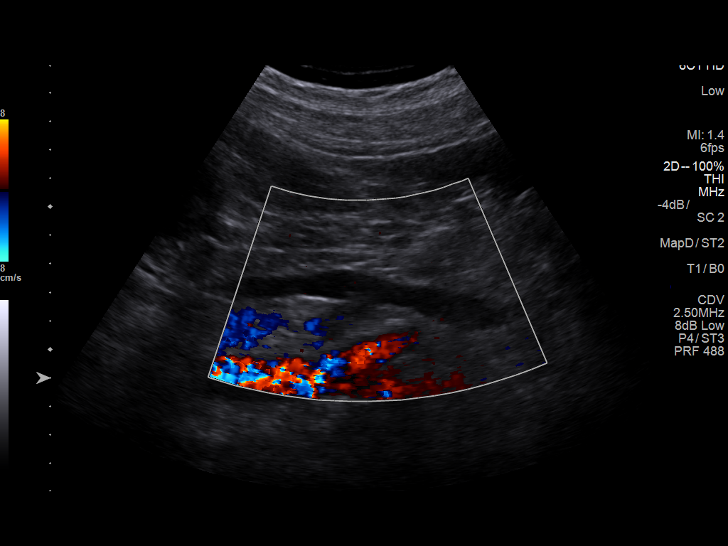
[im 21/63]
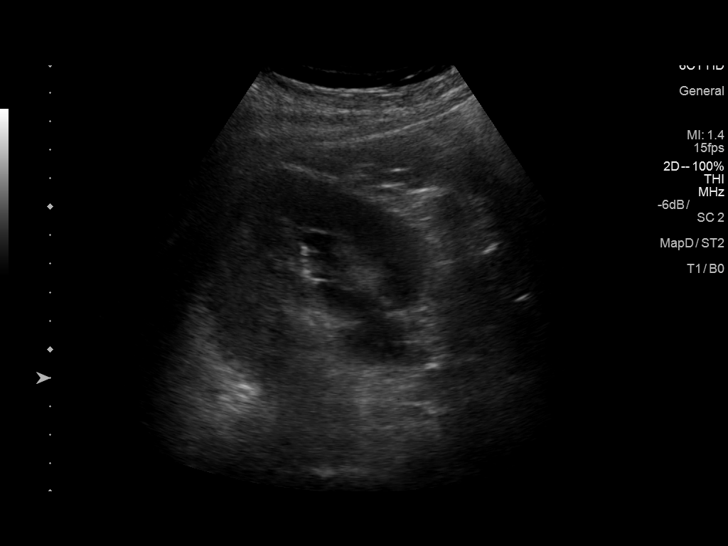
[im 26/63]
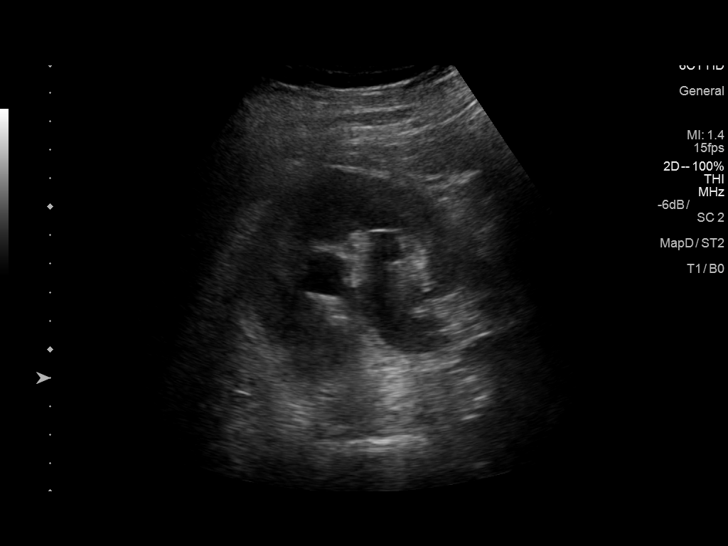
[im 32/63]
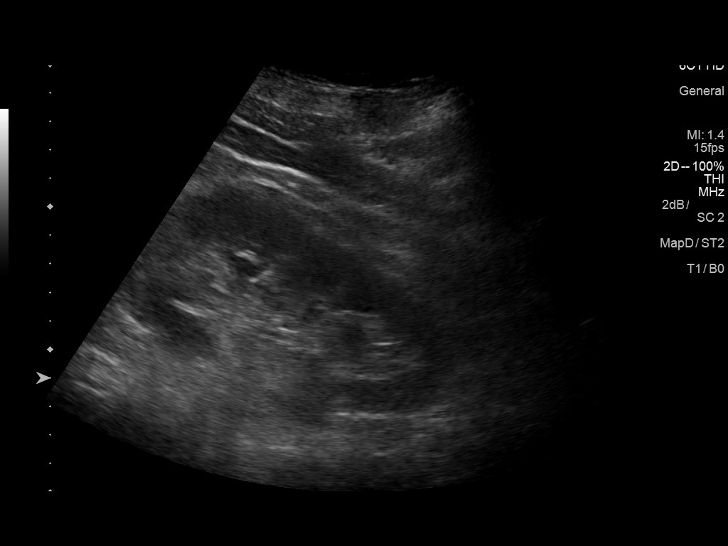
[im 37/63]
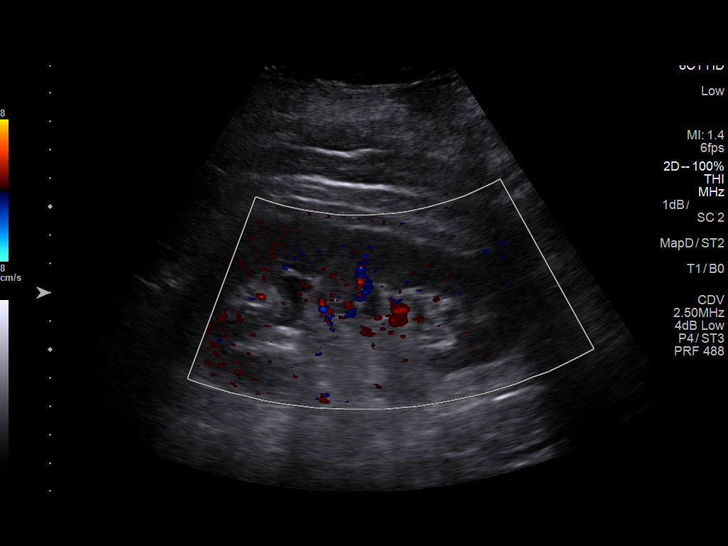
[im 42/63]
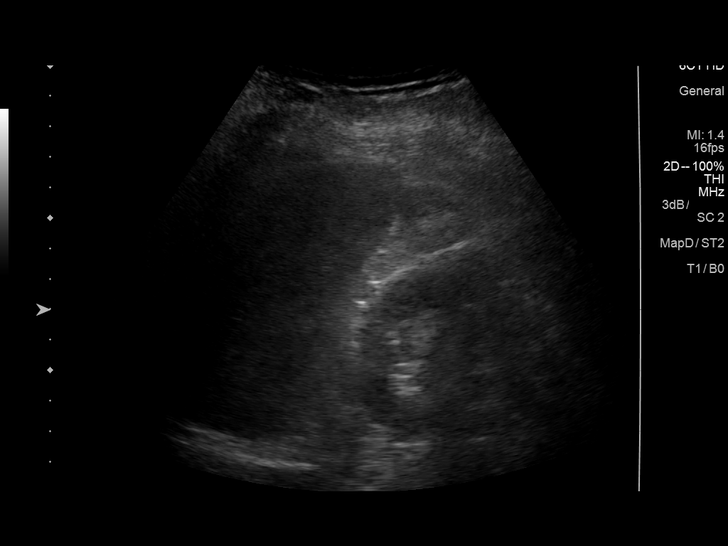
[im 47/63]
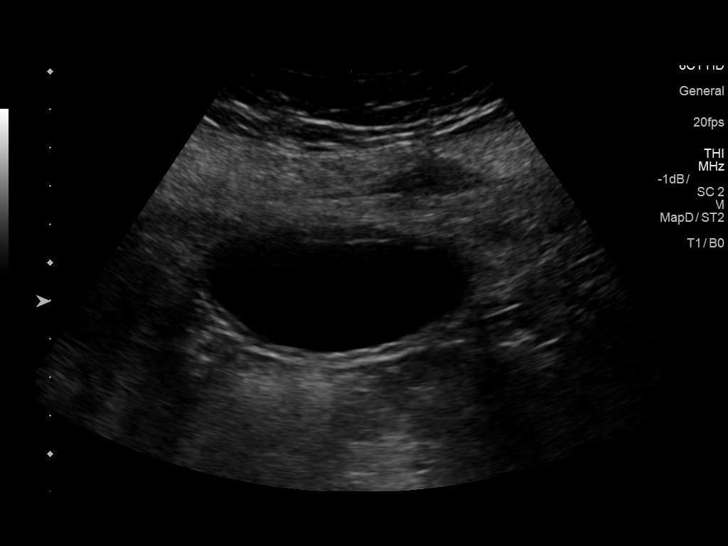
[im 52/63]
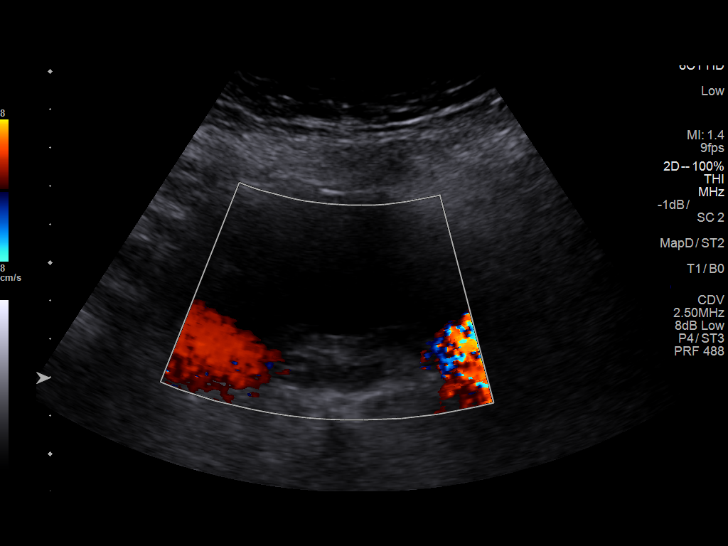
[im 57/63]
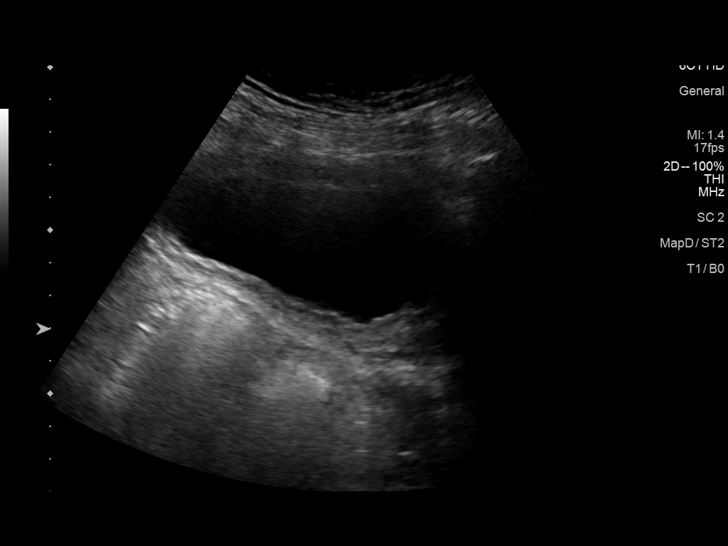
[im 63/63]
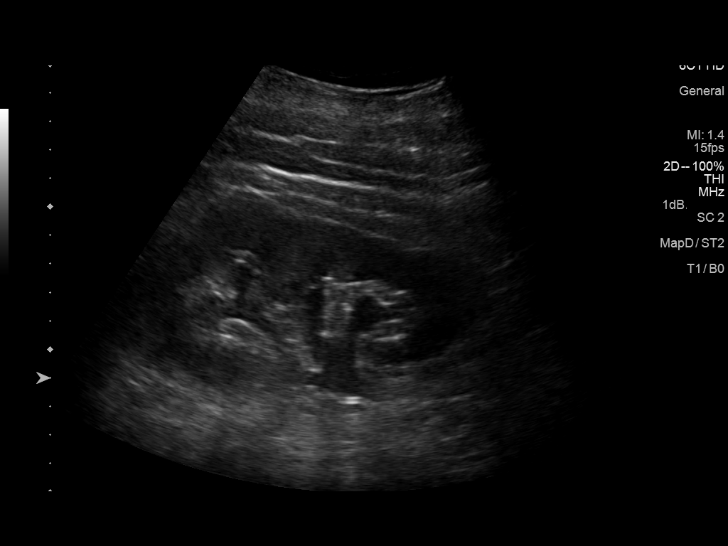

[13 of 25 positions shown; findings below may reference images not displayed]

FINDINGS: Right Kidney:

Renal measurements: 15 x 7.4 x 7.6 = volume: 438 mL. Mild to
moderate hydronephrosis. Renal parenchymal echogenicity is within
normal limits. There is also hydro ureter. The visualized ureter is
dilated.

Left Kidney:

Renal measurements: 13.2 x 6.2 x 4.7 = volume: 202 mL. Minimal
hydronephrosis.

Bladder:

Thick walled bladder. Debris versus irregular wall thickening
dependently within the bladder. There is significant urinary
retention in the bladder following urination.
IMPRESSION: 1. Mild to moderate right-sided hydronephrosis and hydroureter.
2. Minimal left hydronephrosis.
3. Irregular wall thickening in the posterior aspect of the urinary
bladder concerning for bladder mass versus layering debris.
4. Significant postvoid residual within the bladder suggests
incomplete emptying or bladder outlet obstruction.

These results will be called to the ordering clinician or
representative by the Radiologist Assistant, and communication
documented in the PACS or zVision Dashboard.

## 2018-06-15 ENCOUNTER — Encounter: Payer: Self-pay | Admitting: Internal Medicine

## 2018-06-22 ENCOUNTER — Ambulatory Visit: Payer: Self-pay | Admitting: Internal Medicine

## 2018-06-22 ENCOUNTER — Encounter: Payer: Self-pay | Admitting: Internal Medicine

## 2018-06-22 ENCOUNTER — Other Ambulatory Visit (INDEPENDENT_AMBULATORY_CARE_PROVIDER_SITE_OTHER): Payer: Self-pay

## 2018-06-22 VITALS — BP 110/70 | HR 96 | Ht 67.75 in | Wt 241.2 lb

## 2018-06-22 DIAGNOSIS — R109 Unspecified abdominal pain: Secondary | ICD-10-CM

## 2018-06-22 DIAGNOSIS — R195 Other fecal abnormalities: Secondary | ICD-10-CM

## 2018-06-22 DIAGNOSIS — R39198 Other difficulties with micturition: Secondary | ICD-10-CM

## 2018-06-22 DIAGNOSIS — N179 Acute kidney failure, unspecified: Secondary | ICD-10-CM

## 2018-06-22 DIAGNOSIS — R10A1 Flank pain, right side: Secondary | ICD-10-CM

## 2018-06-22 DIAGNOSIS — R194 Change in bowel habit: Secondary | ICD-10-CM

## 2018-06-22 DIAGNOSIS — N400 Enlarged prostate without lower urinary tract symptoms: Secondary | ICD-10-CM

## 2018-06-22 LAB — COMPREHENSIVE METABOLIC PANEL
ALT: 15 U/L (ref 0–53)
AST: 13 U/L (ref 0–37)
Albumin: 4 g/dL (ref 3.5–5.2)
Alkaline Phosphatase: 143 U/L — ABNORMAL HIGH (ref 39–117)
BUN: 38 mg/dL — ABNORMAL HIGH (ref 6–23)
CO2: 25 meq/L (ref 19–32)
Calcium: 9.2 mg/dL (ref 8.4–10.5)
Chloride: 106 mEq/L (ref 96–112)
Creatinine, Ser: 1.94 mg/dL — ABNORMAL HIGH (ref 0.40–1.50)
GFR: 36.03 mL/min — ABNORMAL LOW (ref >60.00)
Glucose, Bld: 105 mg/dL — ABNORMAL HIGH (ref 70–99)
Potassium: 4.6 mEq/L (ref 3.5–5.1)
Sodium: 140 mEq/L (ref 135–145)
Total Bilirubin: 0.2 mg/dL (ref 0.2–1.2)
Total Protein: 7.4 g/dL (ref 6.0–8.3)

## 2018-06-22 LAB — CBC WITH DIFFERENTIAL/PLATELET
Basophils Absolute: 0.1 10*3/uL (ref 0.0–0.1)
Basophils Relative: 0.8 % (ref 0.0–3.0)
Eosinophils Absolute: 0.1 10*3/uL (ref 0.0–0.7)
Eosinophils Relative: 1.2 % (ref 0.0–5.0)
HEMATOCRIT: 40.8 % (ref 39.0–52.0)
Hemoglobin: 13.2 g/dL (ref 13.0–17.0)
Lymphocytes Relative: 20.8 % (ref 12.0–46.0)
Lymphs Abs: 2.4 10*3/uL (ref 0.7–4.0)
MCHC: 32.3 g/dL (ref 30.0–36.0)
MCV: 83.2 fl (ref 78.0–100.0)
Monocytes Absolute: 0.9 10*3/uL (ref 0.1–1.0)
Monocytes Relative: 7.5 % (ref 3.0–12.0)
Neutro Abs: 8.2 10*3/uL — ABNORMAL HIGH (ref 1.4–7.7)
Neutrophils Relative %: 69.7 % (ref 43.0–77.0)
Platelets: 299 10*3/uL (ref 150.0–400.0)
RBC: 4.91 Mil/uL (ref 4.22–5.81)
RDW: 14.2 % (ref 11.5–15.5)
WBC: 11.7 10*3/uL — ABNORMAL HIGH (ref 4.0–10.5)

## 2018-06-22 NOTE — Patient Instructions (Signed)
If you are age 56 or older, your body mass index should be between 23-30. Your Body mass index is 36.95 kg/m. If this is out of the aforementioned range listed, please consider follow up with your Primary Care Provider.  If you are age 85 or younger, your body mass index should be between 19-25. Your Body mass index is 36.95 kg/m. If this is out of the aformentioned range listed, please consider follow up with your Primary Care Provider.   Your provider has requested that you go to the basement level for lab work before leaving today. Press "B" on the elevator. The lab is located at the first door on the left as you exit the elevator.  Please hold your metformin.  I appreciate the opportunity to care for you. Gatha Mayer, Artist Pais., [facility]

## 2018-06-22 NOTE — Progress Notes (Signed)
Kidney function abnormal - can be coming from enlarged prostate   He needs: 1) 2 view abdomen dx R flank pain 2) Renal ultrasound - acute kidney injury,  BPH  Can do both at [facility] imaging   His blood count (CBC) is good so not anemic - my recommendation about a colonoscopy still stands also

## 2018-06-22 NOTE — Progress Notes (Signed)
SHARIFF LASKY 58 y.o. 08-28-1960 [id number]  Assessment & Plan:   Encounter Diagnoses  Name Primary?  . Heme + stool Yes  . Right flank pain   . Abnormal urination   . Change in bowel habits    He needs a colonoscopy.  He is not willing to schedule that yet.  His symptoms outside of the rectal bleeding and bowel changes could be genitourinary in origin.  Certainly the loose stools could be from the metformin but I am concerned that he could have a colorectal cancer driving his GI sxs and have explained that to him.  I told him we would start with a CBC,CMET,UA/reflex Hold metformin again.  Once I see the labs we will decide the next steps, I told him the colonoscopy is still recommended but if he has signs of acute kidney injury etc. he needs urology evaluation again.  Subjective:   Chief Complaint: Heme positive stool change in bowels rectal bleeding  HPI The patient is a 58 year old white man referred by Dr. Arelia Sneddon because of Hemosure positive stool.  He has been taking metformin for diabetes for some time and his stools are very loose on that.  This is been going on some time.  He was diagnosed with diabetes in 2018, he worked on his diet and was able to come off of insulin.  He was hospitalized in 2018 with urinary retention, he had a Foley catheter are placed, and did see urology in follow-up.  I do not have those records.  A CT scan without oral or IV contrast then demonstrated left greater than right mild hydronephrosis and some inflammatory changes.  Within the past couple of weeks he stopped all of his Metformin and became very constipated and then had some rectal bleeding.  He was given a Hemoccult test, Hemosure and that was positive.  The bleeding has stopped but his stools have gone back to being loose though he still feels like he straining to defecate.  He is also got pelvic pressure and difficulty urinating.  He has a constant pressure in the right flank area.   Feels bloated but up pressure they are not quite a pain.  He is never had a colonoscopy, only has had regular medical care since this hospitalization in 2018 I think.  He does not have health insurance and is concerned about the cost of testing and evaluation and treatment. No Known Allergies Current Meds  Medication Sig  . Blood Glucose Monitoring Suppl (RELION CONFIRM GLUCOSE MONITOR) w/Device KIT Use 4 times daily before meals and bedtime as directed.  . finasteride (PROSCAR) 5 MG tablet Take 5 mg by mouth daily.  [name] glucose blood (RELION GLUCOSE TEST STRIPS) test strip Use as instructed  . Lancets 30G MISC Use 4 times daily before meals as directed.  [name] lisinopril (PRINIVIL,ZESTRIL) 20 MG tablet Take 0.5 tablets by mouth daily.  . metFORMIN (GLUCOPHAGE-XR) 500 MG 24 hr tablet Take 1 tablet (552m) daily for the next 4 days, then begin taking 1 tablet (5080m two times every day (for a total of 100072maily).  . simvastatin (ZOCOR) 20 MG tablet Take 1 tablet by mouth daily.  . tamsulosin (FLOMAX) 0.4 MG CAPS capsule Take 1 capsule (0.4 mg total) by mouth daily after supper.   Past Medical History:  Diagnosis Date  . Diabetes mellitus, new onset ([place]  . Enlarged prostate   . HLD (hyperlipidemia)    Past Surgical History:  Procedure Laterality Date  .  KNEE ARTHROSCOPY Right    Social History   Social History Narrative   The patient is separated he has 1 son, he is a Academic librarian.   Quit smoking 2015, no alcohol no caffeine no tobacco or drug use   No health insurance   family history includes Heart disease in his father; Prostate cancer in his father.   Review of Systems See HPI.  He has some muscle pains and cough.  Objective:   Physical Exam _0  110/70 (BP Location: Left Arm, Patient Position: Sitting, Cuff Size: Normal)   Pulse 96   Ht 5' 7.75" (1.721 m) Comment: height measured without shoes  Wt 241 lb 4 oz (109.4 kg)   BMI 36.95  kg/m @  General:  Well-developed, well-nourished and in no acute distress Eyes:  anicteric. ENT:   Mouth and posterior pharynx free of lesions.  Neck:   supple w/o thyromegaly or mass.  Lungs: Clear to auscultation bilaterally. Heart:  S1S2, no rubs, murmurs, gallops. Abdomen:  soft, obese, non-tender but feels pressure in RUQ, no hepatosplenomegaly, hernia, or mass and BS+.  Rectal: Loose stool, firm non-enlarged prostate, no mass.  Nontender. Lymph:  no cervical or supraclavicular adenopathy. Extremities:   no edema, cyanosis or clubbing Skin   no rash. Neuro:  A&O x 3.  Psych:  appropriate mood and  Affect.   Data Reviewed: Primary care notes from late 2019 and 2020.

## 2018-06-26 ENCOUNTER — Ambulatory Visit
Admission: RE | Admit: 2018-06-26 | Discharge: 2018-06-26 | Disposition: A | Discharge status: Home / Self Care | Payer: Self-pay | Source: Ambulatory Visit | Attending: Internal Medicine | Admitting: Internal Medicine

## 2018-06-26 ENCOUNTER — Telehealth: Payer: Self-pay | Admitting: Internal Medicine

## 2018-06-26 DIAGNOSIS — N400 Enlarged prostate without lower urinary tract symptoms: Secondary | ICD-10-CM

## 2018-06-26 DIAGNOSIS — R109 Unspecified abdominal pain: Secondary | ICD-10-CM

## 2018-06-26 DIAGNOSIS — N179 Acute kidney failure, unspecified: Secondary | ICD-10-CM

## 2018-06-26 IMAGING — CR ABDOMEN - 2 VIEW
4 series · 4 of 4 positions shown · non-contrast
Comparison: CT scan of the abdomen and pelvis [DATE]

CLINICAL DATA: 57-year-old male with right flank pain, constipation
and diarrhea as well as bloody stools.

EXAM:
ABDOMEN - 2 VIEW

[t abdomen supine (1 of 2)]
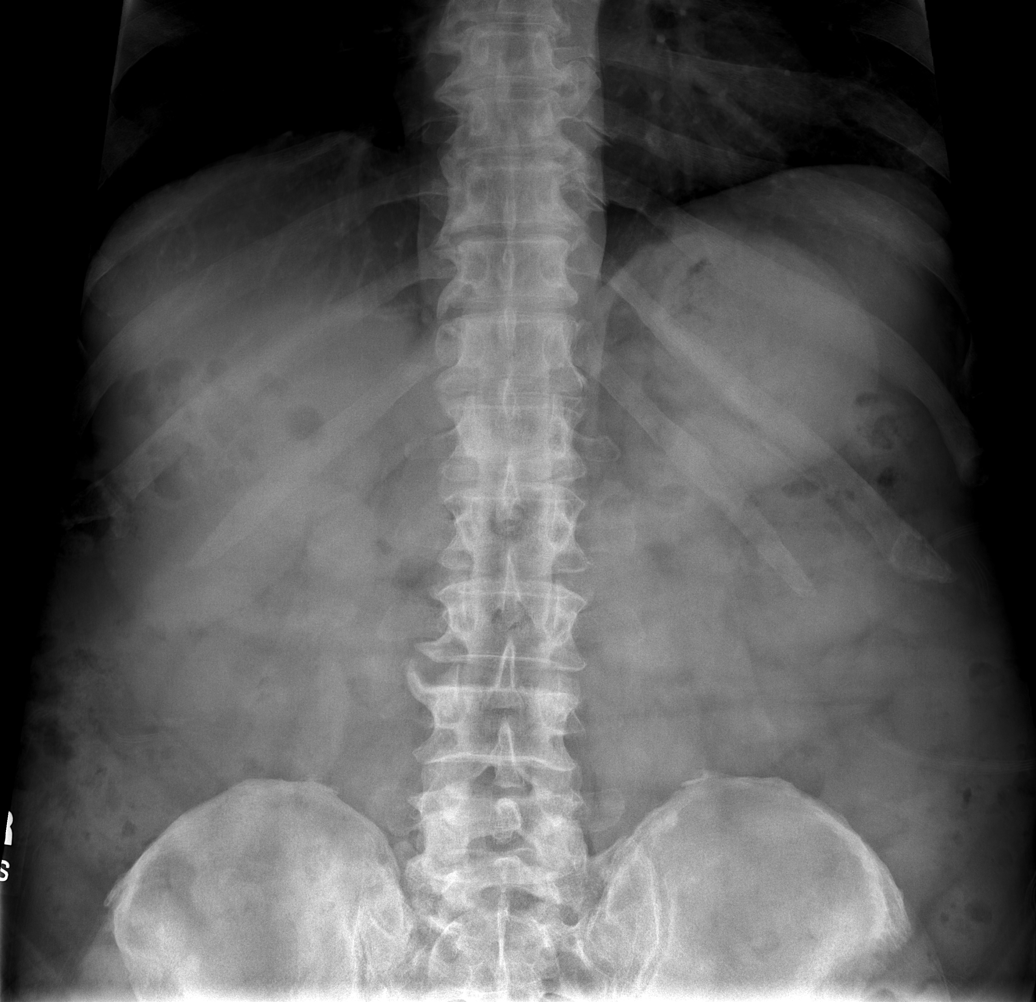

[t abdomen supine (2 of 2)]
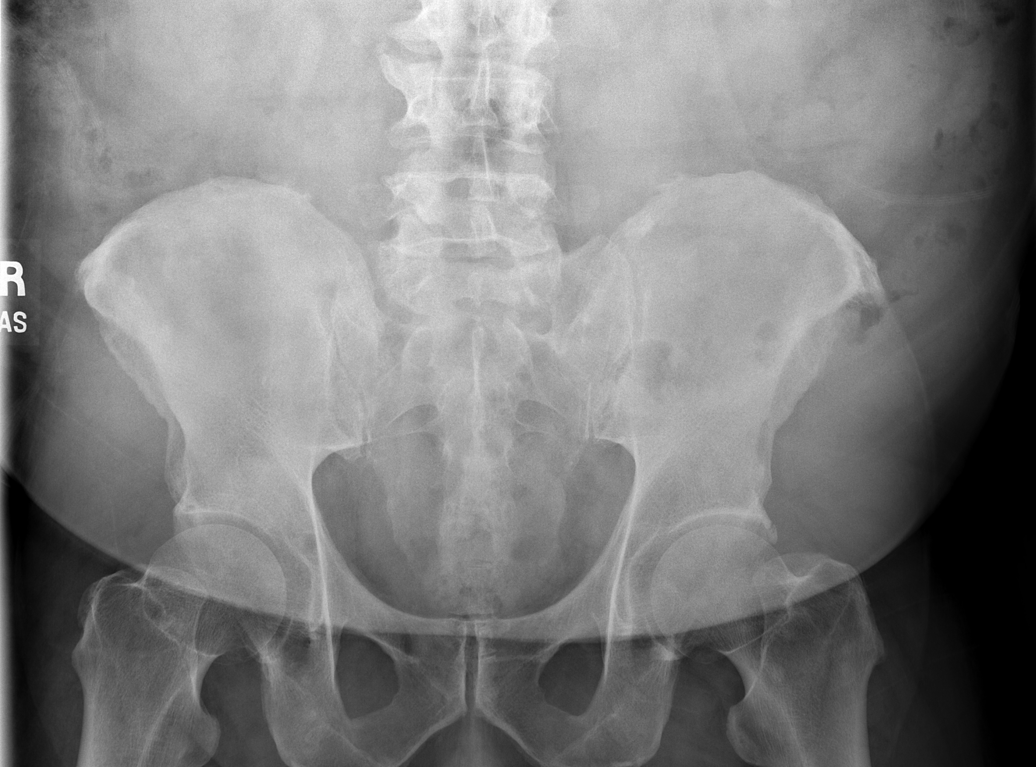

[w abdomen upright (1 of 2)]
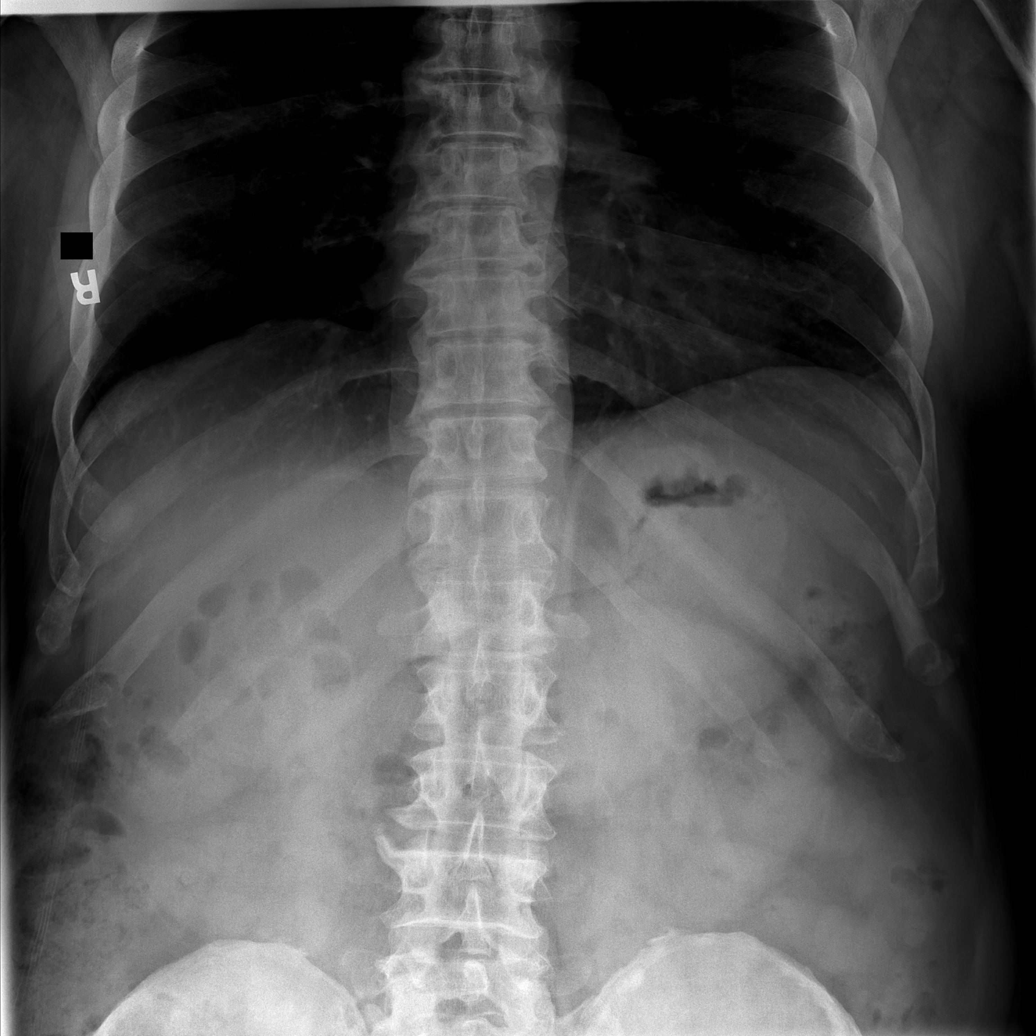

[w abdomen upright (2 of 2)]
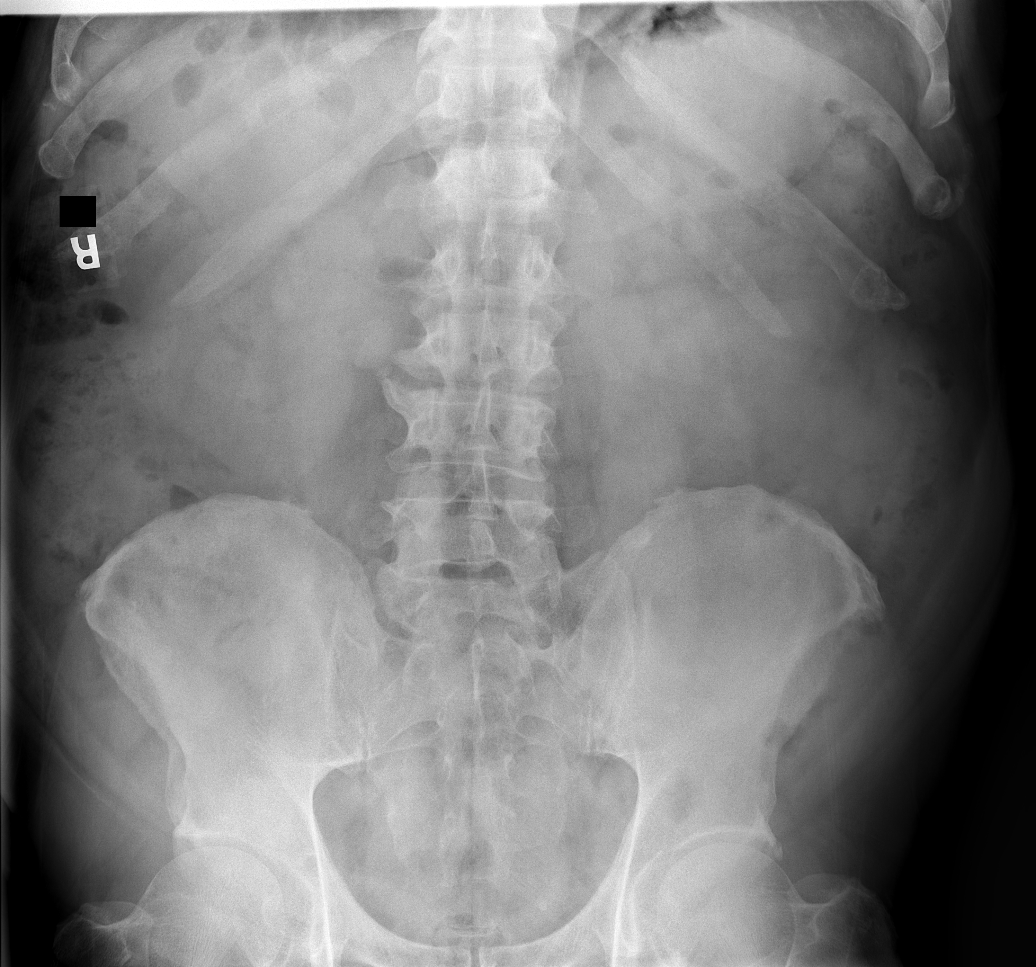

[4 of 4 positions shown; findings below may reference images not displayed]

FINDINGS: The bowel gas pattern is normal. There is no evidence of free air.
No radio-opaque calculi or other significant radiographic
abnormality is seen.
IMPRESSION: Negative.

## 2018-06-26 NOTE — Telephone Encounter (Signed)
Pt is scheduled with [place] for two different times and wanted a call for clarification.

## 2018-06-26 NOTE — Telephone Encounter (Signed)
Patient notified to be at [facility] imaging at 3:45

## 2018-06-27 NOTE — Progress Notes (Signed)
Called patient no answer.

## 2018-06-29 ENCOUNTER — Other Ambulatory Visit: Payer: Self-pay

## 2018-06-29 DIAGNOSIS — R93429 Abnormal radiologic findings on diagnostic imaging of unspecified kidney: Secondary | ICD-10-CM

## 2018-06-29 DIAGNOSIS — R39198 Other difficulties with micturition: Secondary | ICD-10-CM

## 2018-06-29 NOTE — Progress Notes (Signed)
Please tell the patient that he has dilated ureters right > left and either debris or possible tumor in bladder and he needs to go back to urology. He saw [facility] a few years ago - at least in [place] like should get in this week.  He should do BMET today also to see what is happening with kidney function   Still also needs a colonoscopy but would address this first

## 2018-07-24 ENCOUNTER — Telehealth: Payer: Self-pay | Admitting: Family Medicine

## 2018-07-24 NOTE — Telephone Encounter (Signed)
Copied from [place] [id number]. Topic: General - Other >> Jul 24, 2018  4:29 PM Celene Kras A wrote: Reason for CRM: Pts mother called stating pt had been seen for gastro problems and he was told it was important for him to set up an appt with a urologist. Pts mother states there were financially issues that were straightened out by pt and his insurance assured him they would reach out to Dr. Carlean Purl to set up his appt for the urologist; however pts mother states they have heard nothing. Pts mother would like to know what needs to be done on their end to set up this appt as it is urgent and very important. Please contact pt and advise best next steps.

## 2018-07-27 NOTE — Telephone Encounter (Signed)
Patient is advised that [facility] has been trying to reach him.  He is asked to call [phone] and schedule an office visit.

## 2018-07-27 NOTE — Telephone Encounter (Signed)
This was sent to Glenda Chroman FNP instead of Dr Carlean Purl; sending phone note to Barb Merino RN.

## 2018-09-10 ENCOUNTER — Other Ambulatory Visit: Payer: Self-pay | Admitting: Family Medicine

## 2018-09-10 ENCOUNTER — Other Ambulatory Visit: Payer: Self-pay

## 2018-09-10 ENCOUNTER — Ambulatory Visit
Admission: RE | Admit: 2018-09-10 | Discharge: 2018-09-10 | Disposition: A | Discharge status: Home / Self Care | Payer: No Typology Code available for payment source | Source: Ambulatory Visit | Attending: Family Medicine | Admitting: Family Medicine

## 2018-09-10 DIAGNOSIS — M25511 Pain in right shoulder: Secondary | ICD-10-CM

## 2018-09-10 DIAGNOSIS — R0600 Dyspnea, unspecified: Secondary | ICD-10-CM

## 2018-09-10 IMAGING — CR CHEST - 2 VIEW
2 series · 2 of 2 positions shown · non-contrast
Comparison: None.

CLINICAL DATA: Increasing shortness of breath and right upper chest
pain since fall several weeks ago.

EXAM:
CHEST - 2 VIEW

[w chest pa]
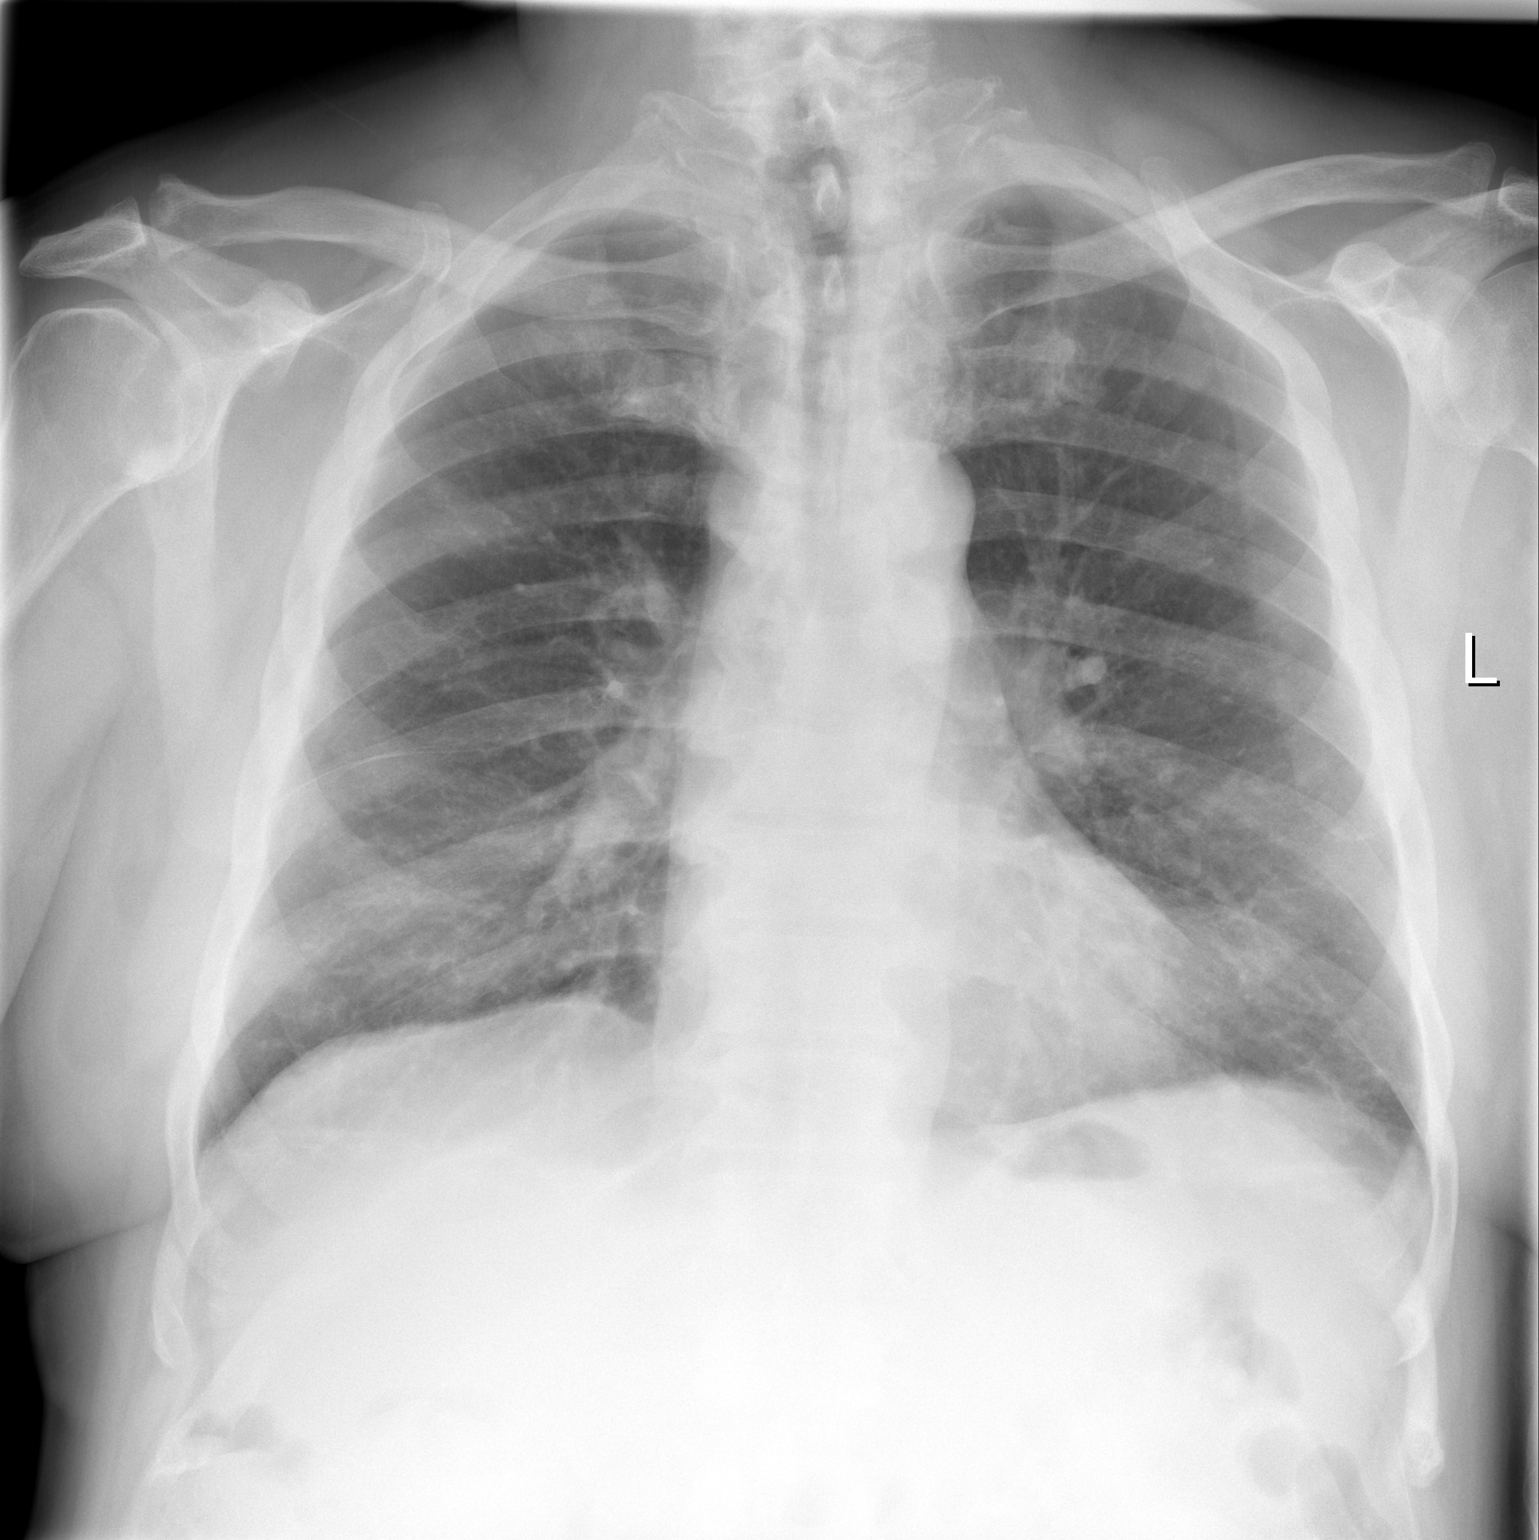

[w chest lat *]
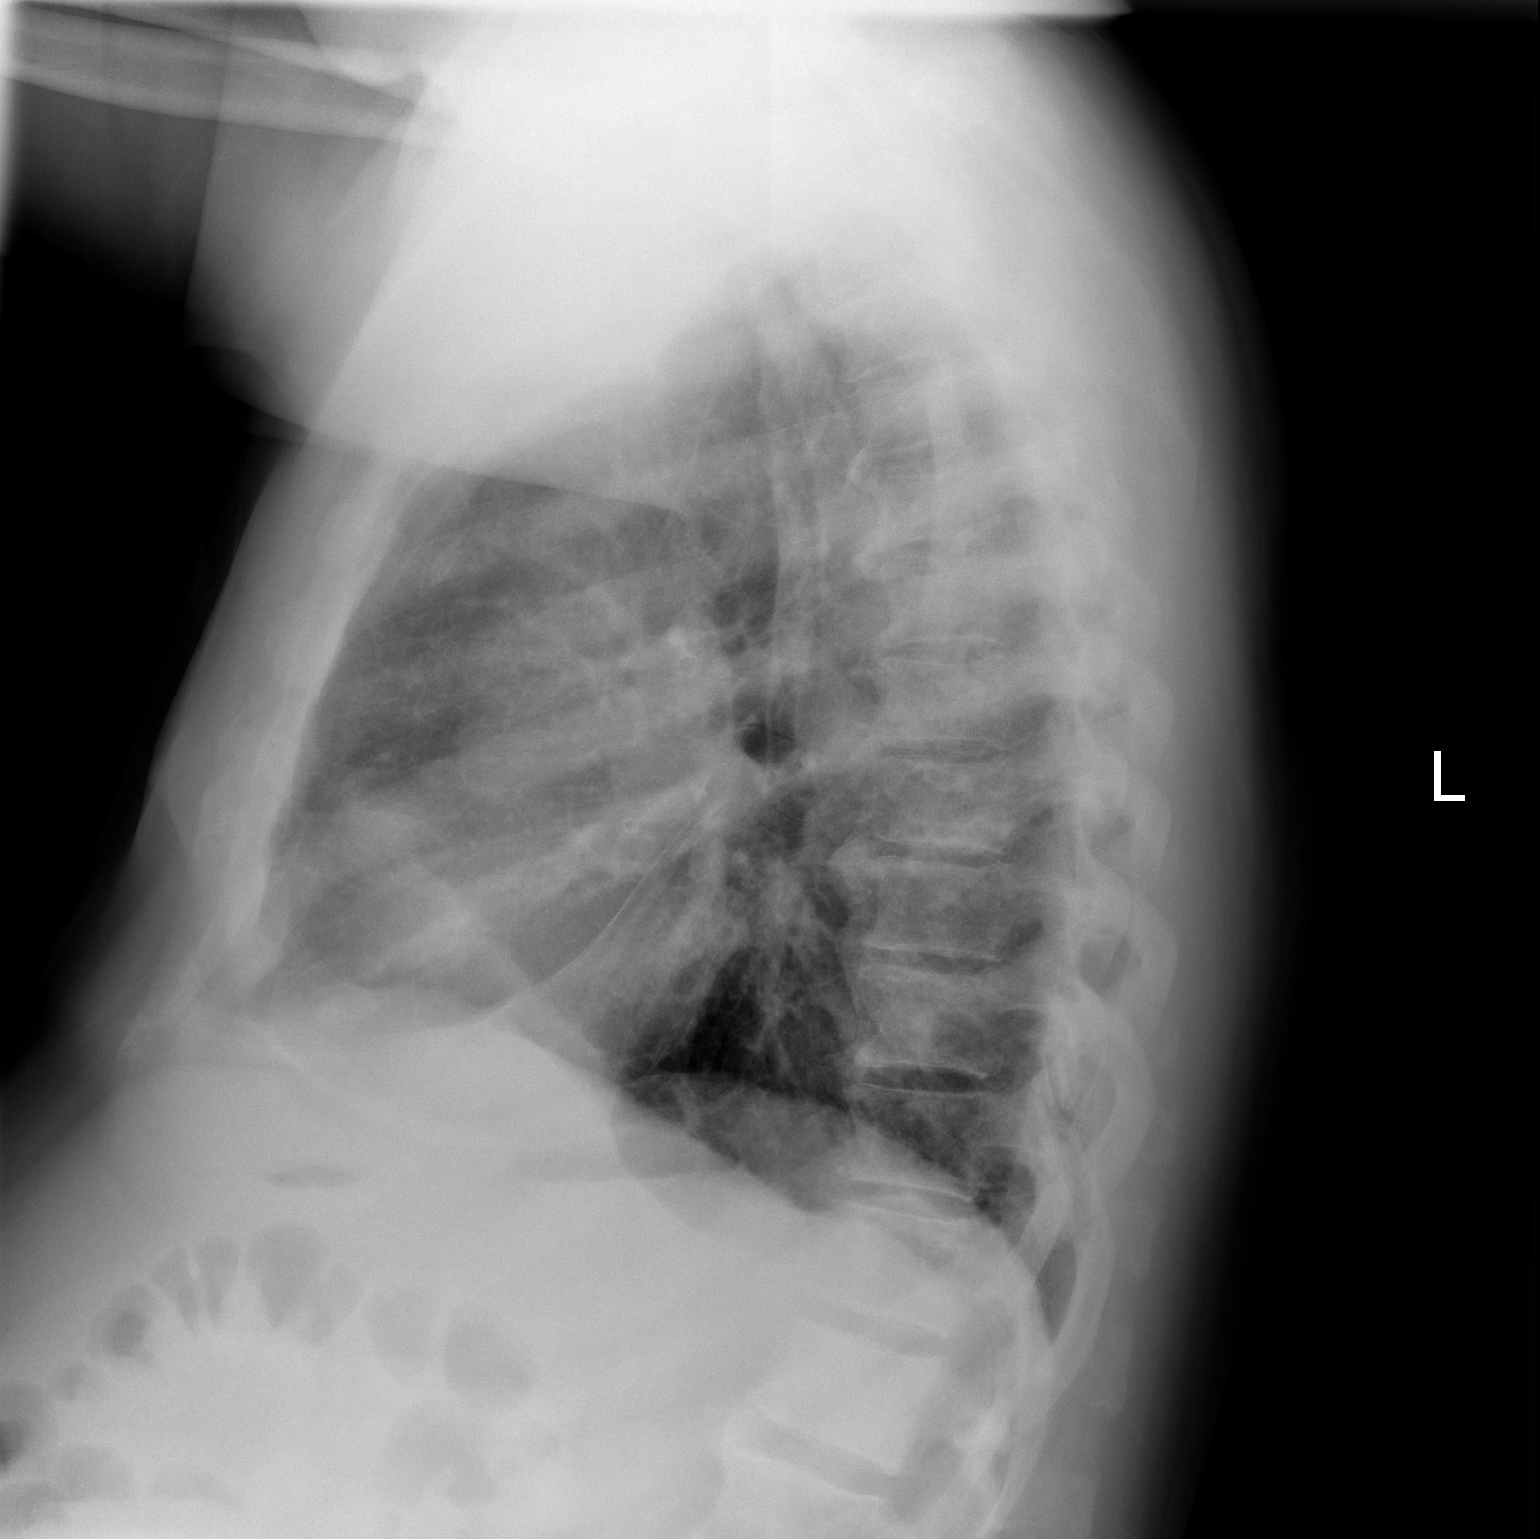

[2 of 2 positions shown; findings below may reference images not displayed]

FINDINGS: The heart size and mediastinal contours are within normal limits.
Normal pulmonary vascularity. No focal consolidation, pleural
effusion, or pneumothorax. No acute osseous abnormality.
IMPRESSION: No active cardiopulmonary disease.

## 2018-10-07 ENCOUNTER — Other Ambulatory Visit (HOSPITAL_COMMUNITY): Payer: Self-pay | Admitting: Urology

## 2018-10-07 DIAGNOSIS — R972 Elevated prostate specific antigen [PSA]: Secondary | ICD-10-CM

## 2018-10-07 DIAGNOSIS — Z8546 Personal history of malignant neoplasm of prostate: Secondary | ICD-10-CM

## 2018-10-11 ENCOUNTER — Inpatient Hospital Stay (HOSPITAL_COMMUNITY)
Admission: EM | Admit: 2018-10-11 | Discharge: 2018-10-14 | DRG: 711 | Disposition: A | Discharge status: Home / Self Care | Payer: Medicaid Other | Attending: Internal Medicine | Admitting: Internal Medicine

## 2018-10-11 ENCOUNTER — Encounter (HOSPITAL_COMMUNITY): Payer: Self-pay

## 2018-10-11 ENCOUNTER — Other Ambulatory Visit: Payer: Self-pay

## 2018-10-11 DIAGNOSIS — E1122 Type 2 diabetes mellitus with diabetic chronic kidney disease: Secondary | ICD-10-CM | POA: Diagnosis present

## 2018-10-11 DIAGNOSIS — D631 Anemia in chronic kidney disease: Secondary | ICD-10-CM | POA: Diagnosis present

## 2018-10-11 DIAGNOSIS — R338 Other retention of urine: Secondary | ICD-10-CM | POA: Diagnosis present

## 2018-10-11 DIAGNOSIS — E872 Acidosis, unspecified: Secondary | ICD-10-CM | POA: Diagnosis present

## 2018-10-11 DIAGNOSIS — Z7984 Long term (current) use of oral hypoglycemic drugs: Secondary | ICD-10-CM

## 2018-10-11 DIAGNOSIS — I129 Hypertensive chronic kidney disease with stage 1 through stage 4 chronic kidney disease, or unspecified chronic kidney disease: Secondary | ICD-10-CM | POA: Diagnosis present

## 2018-10-11 DIAGNOSIS — Z8249 Family history of ischemic heart disease and other diseases of the circulatory system: Secondary | ICD-10-CM

## 2018-10-11 DIAGNOSIS — N179 Acute kidney failure, unspecified: Secondary | ICD-10-CM | POA: Diagnosis present

## 2018-10-11 DIAGNOSIS — E119 Type 2 diabetes mellitus without complications: Secondary | ICD-10-CM

## 2018-10-11 DIAGNOSIS — R339 Retention of urine, unspecified: Secondary | ICD-10-CM

## 2018-10-11 DIAGNOSIS — N401 Enlarged prostate with lower urinary tract symptoms: Secondary | ICD-10-CM | POA: Diagnosis present

## 2018-10-11 DIAGNOSIS — C61 Malignant neoplasm of prostate: Principal | ICD-10-CM | POA: Diagnosis present

## 2018-10-11 DIAGNOSIS — E875 Hyperkalemia: Secondary | ICD-10-CM

## 2018-10-11 DIAGNOSIS — N35919 Unspecified urethral stricture, male, unspecified site: Secondary | ICD-10-CM | POA: Diagnosis present

## 2018-10-11 DIAGNOSIS — C7951 Secondary malignant neoplasm of bone: Secondary | ICD-10-CM | POA: Diagnosis present

## 2018-10-11 DIAGNOSIS — N3001 Acute cystitis with hematuria: Secondary | ICD-10-CM

## 2018-10-11 DIAGNOSIS — Z79899 Other long term (current) drug therapy: Secondary | ICD-10-CM

## 2018-10-11 DIAGNOSIS — R300 Dysuria: Secondary | ICD-10-CM | POA: Diagnosis present

## 2018-10-11 DIAGNOSIS — E785 Hyperlipidemia, unspecified: Secondary | ICD-10-CM | POA: Diagnosis present

## 2018-10-11 DIAGNOSIS — N183 Chronic kidney disease, stage 3 unspecified: Secondary | ICD-10-CM

## 2018-10-11 DIAGNOSIS — K760 Fatty (change of) liver, not elsewhere classified: Secondary | ICD-10-CM | POA: Diagnosis present

## 2018-10-11 DIAGNOSIS — E538 Deficiency of other specified B group vitamins: Secondary | ICD-10-CM | POA: Diagnosis present

## 2018-10-11 DIAGNOSIS — N410 Acute prostatitis: Secondary | ICD-10-CM

## 2018-10-11 DIAGNOSIS — Z8042 Family history of malignant neoplasm of prostate: Secondary | ICD-10-CM

## 2018-10-11 DIAGNOSIS — C779 Secondary and unspecified malignant neoplasm of lymph node, unspecified: Secondary | ICD-10-CM | POA: Diagnosis present

## 2018-10-11 DIAGNOSIS — N136 Pyonephrosis: Secondary | ICD-10-CM | POA: Diagnosis present

## 2018-10-11 DIAGNOSIS — Z87891 Personal history of nicotine dependence: Secondary | ICD-10-CM

## 2018-10-11 DIAGNOSIS — Z1159 Encounter for screening for other viral diseases: Secondary | ICD-10-CM

## 2018-10-11 DIAGNOSIS — N1330 Unspecified hydronephrosis: Secondary | ICD-10-CM | POA: Diagnosis present

## 2018-10-11 LAB — CBC WITH DIFFERENTIAL/PLATELET
Abs Immature Granulocytes: 0.26 10*3/uL — ABNORMAL HIGH (ref 0.00–0.07)
Basophils Absolute: 0.1 10*3/uL (ref 0.0–0.1)
Basophils Relative: 0 %
Eosinophils Absolute: 0.3 10*3/uL (ref 0.0–0.5)
Eosinophils Relative: 2 %
HCT: 38.8 % — ABNORMAL LOW (ref 39.0–52.0)
Hemoglobin: 11.2 g/dL — ABNORMAL LOW (ref 13.0–17.0)
Immature Granulocytes: 2 %
Lymphocytes Relative: 21 %
Lymphs Abs: 2.4 10*3/uL (ref 0.7–4.0)
MCH: 25.3 pg — ABNORMAL LOW (ref 26.0–34.0)
MCHC: 28.9 g/dL — ABNORMAL LOW (ref 30.0–36.0)
MCV: 87.8 fL (ref 80.0–100.0)
Monocytes Absolute: 1 10*3/uL (ref 0.1–1.0)
Monocytes Relative: 8 %
Neutro Abs: 7.4 10*3/uL (ref 1.7–7.7)
Neutrophils Relative %: 67 %
Platelets: 416 10*3/uL — ABNORMAL HIGH (ref 150–400)
RBC: 4.42 MIL/uL (ref 4.22–5.81)
RDW: 16.1 % — ABNORMAL HIGH (ref 11.5–15.5)
WBC: 11.3 10*3/uL — ABNORMAL HIGH (ref 4.0–10.5)
nRBC: 0 % (ref 0.0–0.2)

## 2018-10-11 LAB — BASIC METABOLIC PANEL
Anion gap: 10 (ref 5–15)
BUN: 47 mg/dL — ABNORMAL HIGH (ref 6–20)
CO2: 16 mmol/L — ABNORMAL LOW (ref 22–32)
Calcium: 9.1 mg/dL (ref 8.9–10.3)
Chloride: 115 mmol/L — ABNORMAL HIGH (ref 98–111)
Creatinine, Ser: 2.06 mg/dL — ABNORMAL HIGH (ref 0.61–1.24)
GFR calc Af Amer: 41 mL/min — ABNORMAL LOW (ref >60)
GFR calc non Af Amer: 35 mL/min — ABNORMAL LOW (ref >60)
Glucose, Bld: 134 mg/dL — ABNORMAL HIGH (ref 70–99)
Potassium: 5.9 mmol/L — ABNORMAL HIGH (ref 3.5–5.1)
Sodium: 141 mmol/L (ref 135–145)

## 2018-10-11 LAB — URINALYSIS, ROUTINE W REFLEX MICROSCOPIC
Bilirubin Urine: NEGATIVE
Glucose, UA: NEGATIVE mg/dL
Ketones, ur: NEGATIVE mg/dL
Nitrite: NEGATIVE
Protein, ur: 100 mg/dL — AB
RBC / HPF: >50 RBC/hpf — ABNORMAL HIGH (ref 0–5)
Specific Gravity, Urine: 1.011 (ref 1.005–1.030)
WBC, UA: >50 WBC/hpf — ABNORMAL HIGH (ref 0–5)
pH: 5 (ref 5.0–8.0)

## 2018-10-11 MED ORDER — SODIUM CHLORIDE 0.9 % IV BOLUS (SEPSIS)
1000.0000 mL | Freq: Once | INTRAVENOUS | Status: AC
  Administered 2018-10-12: 1000 mL via INTRAVENOUS

## 2018-10-11 NOTE — ED Triage Notes (Signed)
Pt arrived stating he has an enlarged prostate and has been unable to urinate. Pt able to self cath at home but states the catheter tube is so small it filled up with bloody mucus. Pt unable to recall the last time he was able to fully urinate that only small amounts have been coming out for three days.

## 2018-10-11 NOTE — ED Provider Notes (Signed)
MSE was initiated and I personally evaluated the patient and placed orders (if any) at  10:47 PM on October 11, 2018.  He complains of urinary urgency, hematuria, voiding small amounts, and pressure in the lower abdomen for 2 days.  He attempted self-catheterization, x4 today without recovery of urine.  At this time, lower abdomen is tender without palpable mass.  Bladder scan done by nursing is remarkable for no urine in the urinary bladder.  Renal labs will be ordered, patient was able to void about 2 ounces of clear yellow urine.  The patient appears stable so that the remainder of the MSE may be completed by another provider.   Daleen Bo, MD 10/11/18 2249

## 2018-10-12 ENCOUNTER — Inpatient Hospital Stay (HOSPITAL_COMMUNITY): Payer: Medicaid Other

## 2018-10-12 ENCOUNTER — Encounter (HOSPITAL_COMMUNITY): Payer: Self-pay | Admitting: Certified Registered"

## 2018-10-12 ENCOUNTER — Encounter (HOSPITAL_COMMUNITY)
Admission: EM | Disposition: A | Discharge status: Home / Self Care | Payer: Self-pay | Source: Home / Self Care | Attending: Internal Medicine

## 2018-10-12 ENCOUNTER — Inpatient Hospital Stay (HOSPITAL_COMMUNITY): Payer: Medicaid Other | Admitting: Certified Registered"

## 2018-10-12 ENCOUNTER — Observation Stay (HOSPITAL_COMMUNITY): Payer: Medicaid Other

## 2018-10-12 DIAGNOSIS — E538 Deficiency of other specified B group vitamins: Secondary | ICD-10-CM | POA: Diagnosis present

## 2018-10-12 DIAGNOSIS — E1122 Type 2 diabetes mellitus with diabetic chronic kidney disease: Secondary | ICD-10-CM | POA: Diagnosis present

## 2018-10-12 DIAGNOSIS — N179 Acute kidney failure, unspecified: Secondary | ICD-10-CM | POA: Diagnosis present

## 2018-10-12 DIAGNOSIS — Z7984 Long term (current) use of oral hypoglycemic drugs: Secondary | ICD-10-CM | POA: Diagnosis not present

## 2018-10-12 DIAGNOSIS — Z79899 Other long term (current) drug therapy: Secondary | ICD-10-CM | POA: Diagnosis not present

## 2018-10-12 DIAGNOSIS — Z1159 Encounter for screening for other viral diseases: Secondary | ICD-10-CM | POA: Diagnosis not present

## 2018-10-12 DIAGNOSIS — K760 Fatty (change of) liver, not elsewhere classified: Secondary | ICD-10-CM | POA: Diagnosis present

## 2018-10-12 DIAGNOSIS — C779 Secondary and unspecified malignant neoplasm of lymph node, unspecified: Secondary | ICD-10-CM | POA: Diagnosis present

## 2018-10-12 DIAGNOSIS — E875 Hyperkalemia: Secondary | ICD-10-CM | POA: Diagnosis present

## 2018-10-12 DIAGNOSIS — N183 Chronic kidney disease, stage 3 unspecified: Secondary | ICD-10-CM | POA: Diagnosis present

## 2018-10-12 DIAGNOSIS — N401 Enlarged prostate with lower urinary tract symptoms: Secondary | ICD-10-CM | POA: Diagnosis present

## 2018-10-12 DIAGNOSIS — N3001 Acute cystitis with hematuria: Secondary | ICD-10-CM | POA: Diagnosis not present

## 2018-10-12 DIAGNOSIS — D631 Anemia in chronic kidney disease: Secondary | ICD-10-CM | POA: Diagnosis present

## 2018-10-12 DIAGNOSIS — E785 Hyperlipidemia, unspecified: Secondary | ICD-10-CM | POA: Diagnosis present

## 2018-10-12 DIAGNOSIS — Z8042 Family history of malignant neoplasm of prostate: Secondary | ICD-10-CM | POA: Diagnosis not present

## 2018-10-12 DIAGNOSIS — E872 Acidosis, unspecified: Secondary | ICD-10-CM | POA: Diagnosis present

## 2018-10-12 DIAGNOSIS — R338 Other retention of urine: Secondary | ICD-10-CM | POA: Diagnosis present

## 2018-10-12 DIAGNOSIS — Z8249 Family history of ischemic heart disease and other diseases of the circulatory system: Secondary | ICD-10-CM | POA: Diagnosis not present

## 2018-10-12 DIAGNOSIS — Z87891 Personal history of nicotine dependence: Secondary | ICD-10-CM | POA: Diagnosis not present

## 2018-10-12 DIAGNOSIS — I129 Hypertensive chronic kidney disease with stage 1 through stage 4 chronic kidney disease, or unspecified chronic kidney disease: Secondary | ICD-10-CM | POA: Diagnosis present

## 2018-10-12 DIAGNOSIS — N410 Acute prostatitis: Secondary | ICD-10-CM | POA: Diagnosis not present

## 2018-10-12 DIAGNOSIS — R339 Retention of urine, unspecified: Secondary | ICD-10-CM

## 2018-10-12 DIAGNOSIS — E119 Type 2 diabetes mellitus without complications: Secondary | ICD-10-CM

## 2018-10-12 DIAGNOSIS — N35919 Unspecified urethral stricture, male, unspecified site: Secondary | ICD-10-CM | POA: Diagnosis present

## 2018-10-12 DIAGNOSIS — C61 Malignant neoplasm of prostate: Secondary | ICD-10-CM | POA: Diagnosis present

## 2018-10-12 DIAGNOSIS — R300 Dysuria: Secondary | ICD-10-CM | POA: Diagnosis present

## 2018-10-12 DIAGNOSIS — C7951 Secondary malignant neoplasm of bone: Secondary | ICD-10-CM | POA: Diagnosis present

## 2018-10-12 DIAGNOSIS — N136 Pyonephrosis: Secondary | ICD-10-CM | POA: Diagnosis present

## 2018-10-12 HISTORY — PX: ORCHIECTOMY: SHX2116

## 2018-10-12 HISTORY — PX: TRANSURETHRAL RESECTION OF PROSTATE: SHX73

## 2018-10-12 LAB — PROTEIN / CREATININE RATIO, URINE
Creatinine, Urine: 25.72 mg/dL
Total Protein, Urine: <6 mg/dL

## 2018-10-12 LAB — BASIC METABOLIC PANEL
Anion gap: 8 (ref 5–15)
BUN: 46 mg/dL — ABNORMAL HIGH (ref 6–20)
CO2: 20 mmol/L — ABNORMAL LOW (ref 22–32)
Calcium: 9.1 mg/dL (ref 8.9–10.3)
Chloride: 115 mmol/L — ABNORMAL HIGH (ref 98–111)
Creatinine, Ser: 2.03 mg/dL — ABNORMAL HIGH (ref 0.61–1.24)
GFR calc Af Amer: 42 mL/min — ABNORMAL LOW (ref >60)
GFR calc non Af Amer: 36 mL/min — ABNORMAL LOW (ref >60)
Glucose, Bld: 122 mg/dL — ABNORMAL HIGH (ref 70–99)
Potassium: 5.7 mmol/L — ABNORMAL HIGH (ref 3.5–5.1)
Sodium: 143 mmol/L (ref 135–145)

## 2018-10-12 LAB — GLUCOSE, CAPILLARY
Glucose-Capillary: 118 mg/dL — ABNORMAL HIGH (ref 70–99)
Glucose-Capillary: 107 mg/dL — ABNORMAL HIGH (ref 70–99)
Glucose-Capillary: 88 mg/dL (ref 70–99)
Glucose-Capillary: 85 mg/dL (ref 70–99)
Glucose-Capillary: 93 mg/dL (ref 70–99)
Glucose-Capillary: 210 mg/dL — ABNORMAL HIGH (ref 70–99)

## 2018-10-12 LAB — LACTIC ACID, PLASMA
Lactic Acid, Venous: 0.8 mmol/L (ref 0.5–1.9)
Lactic Acid, Venous: 0.8 mmol/L (ref 0.5–1.9)

## 2018-10-12 LAB — POTASSIUM
Potassium: 5.7 mmol/L — ABNORMAL HIGH (ref 3.5–5.1)
Potassium: 5.6 mmol/L — ABNORMAL HIGH (ref 3.5–5.1)

## 2018-10-12 LAB — CREATININE, URINE, RANDOM: Creatinine, Urine: 38.31 mg/dL

## 2018-10-12 LAB — HIV ANTIBODY (ROUTINE TESTING W REFLEX): HIV Screen 4th Generation wRfx: NONREACTIVE

## 2018-10-12 LAB — NA AND K (SODIUM & POTASSIUM), RAND UR
Potassium Urine: 16 mmol/L
Sodium, Ur: 44 mmol/L

## 2018-10-12 LAB — HEMOGLOBIN A1C
Hgb A1c MFr Bld: 6.5 % — ABNORMAL HIGH (ref 4.8–5.6)
Mean Plasma Glucose: 139.85 mg/dL

## 2018-10-12 LAB — SARS CORONAVIRUS 2 BY RT PCR (HOSPITAL ORDER, PERFORMED IN ~~LOC~~ HOSPITAL LAB): SARS Coronavirus 2: NEGATIVE

## 2018-10-12 LAB — MRSA PCR SCREENING: MRSA by PCR: NEGATIVE

## 2018-10-12 IMAGING — CT CT RENAL STONE PROTOCOL
2 of 4 series · 15 of 46 positions shown, 17 images · non-contrast
Comparison: [DATE]

CLINICAL DATA: Urinary retention

EXAM:
CT ABDOMEN AND PELVIS WITHOUT CONTRAST
TECHNIQUE: Multidetector CT imaging of the abdomen and pelvis was performed
following the standard protocol without IV contrast.

[Series 2: axial st · axial · 0.91mm/px · z∈[-535,-120]mm · 12 of 93 slices shown, 14 images]
[im 5/93  soft-tissue]
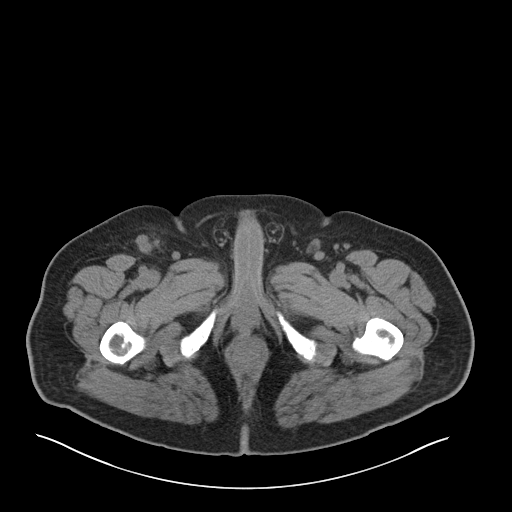
[im 5/93  bone]
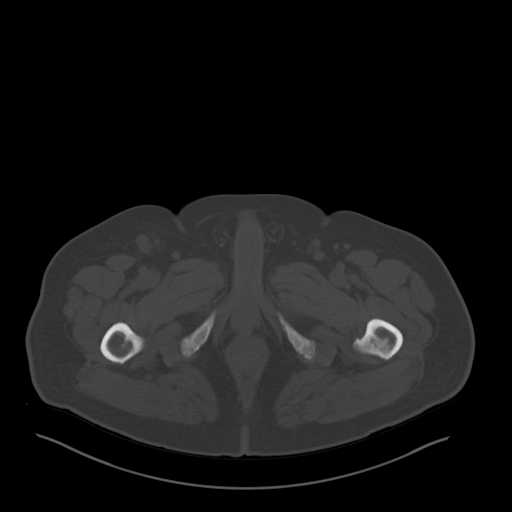
[im 15/93  soft-tissue]
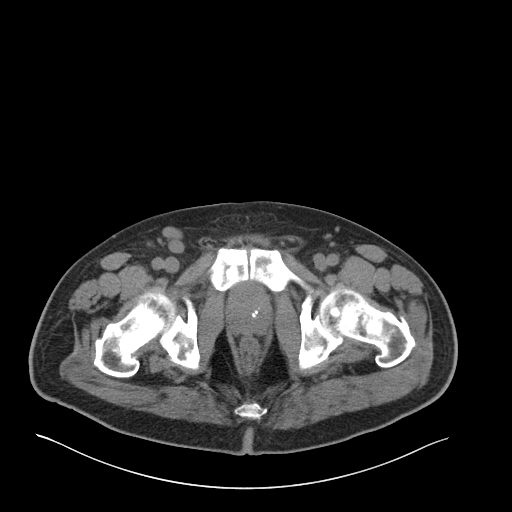
[im 20/93  soft-tissue]
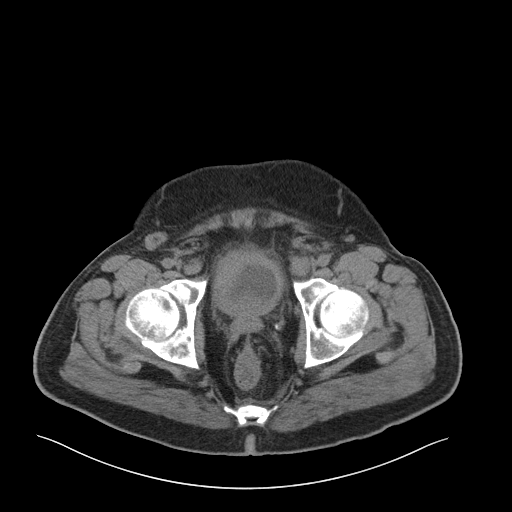
[im 30/93  soft-tissue]
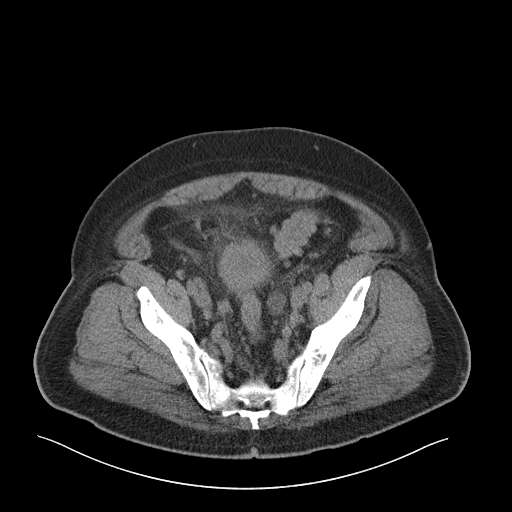
[im 34/93  soft-tissue]
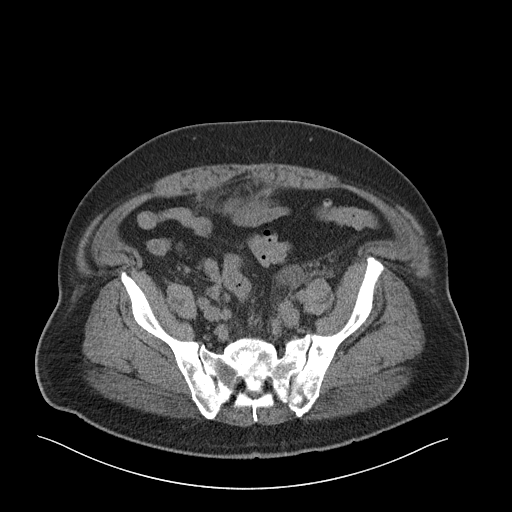
[im 44/93  soft-tissue]
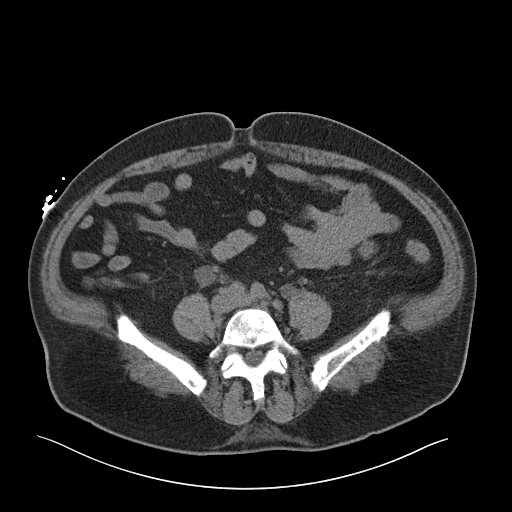
[im 49/93  soft-tissue]
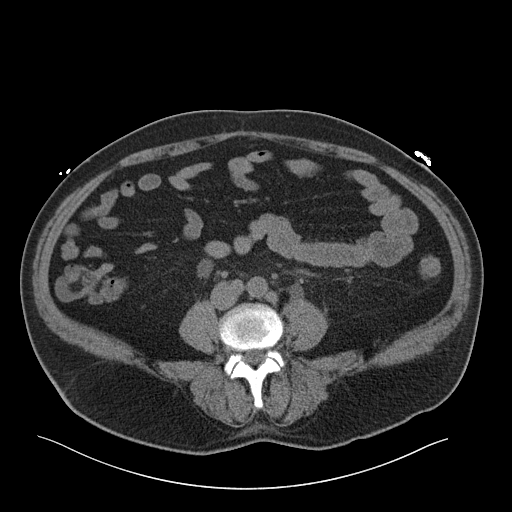
[im 59/93  soft-tissue]
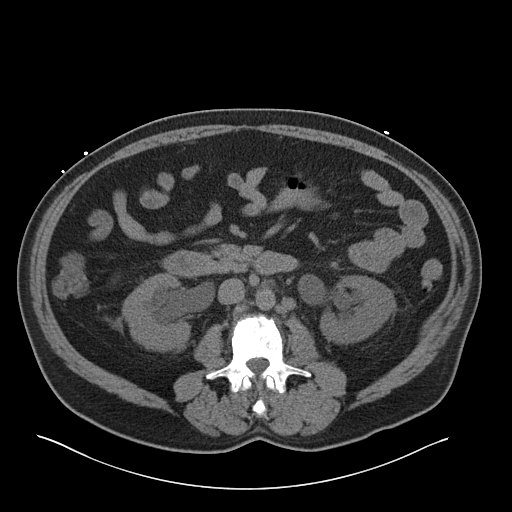
[im 63/93  soft-tissue]
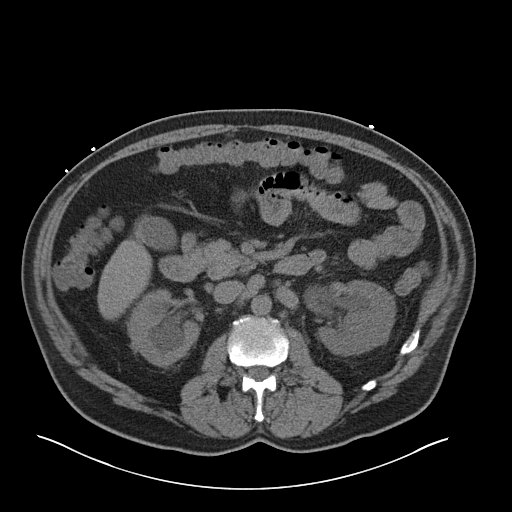
[im 63/93  bone]
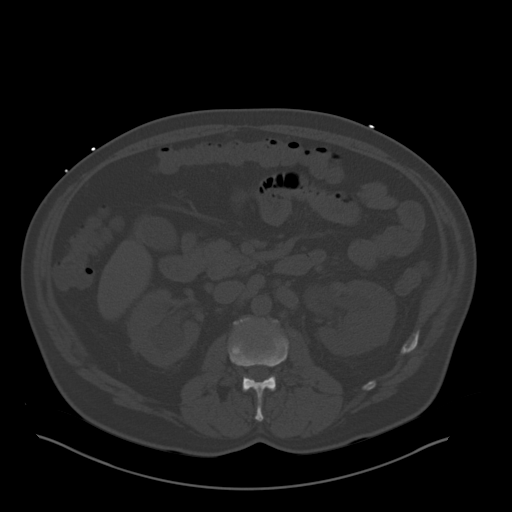
[im 73/93  soft-tissue]
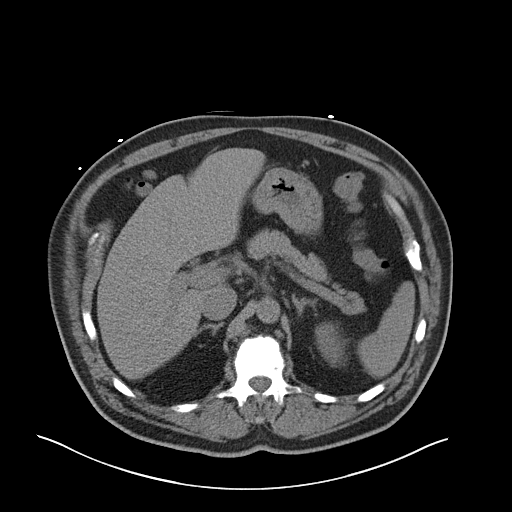
[im 78/93  soft-tissue]
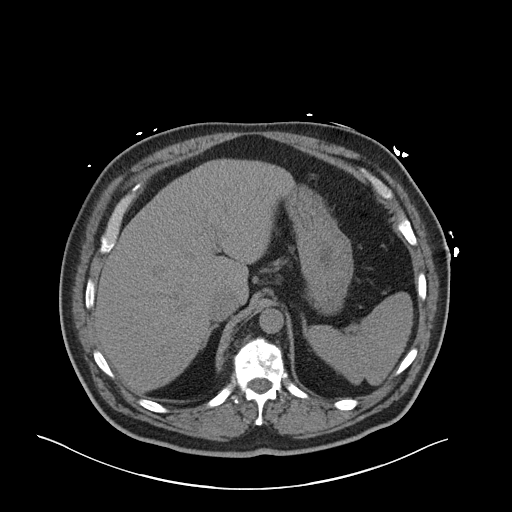
[im 88/93  soft-tissue]
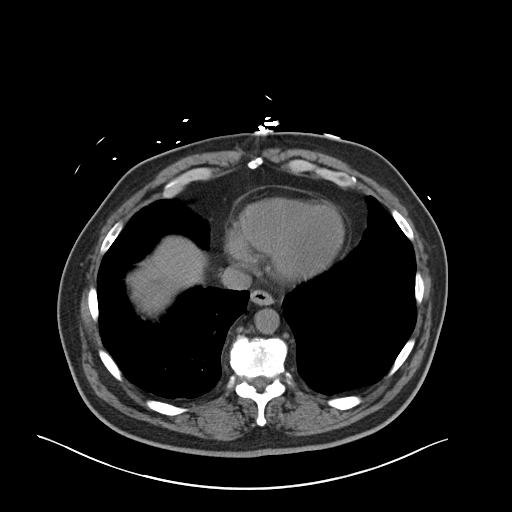

[Series 5: coronal · coronal · 0.87mm/px · 3 of 161 slices shown]
[im 54/161  soft-tissue]
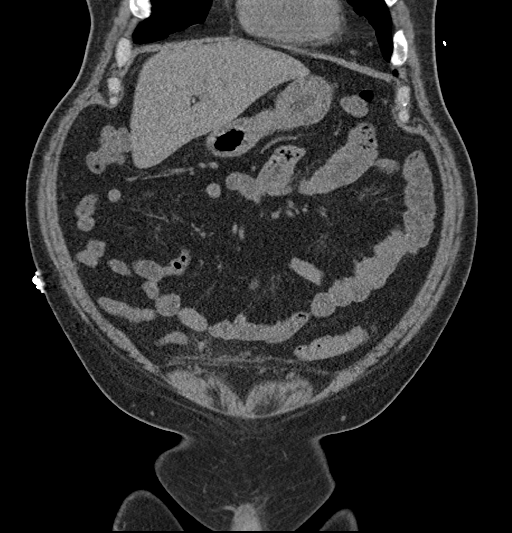
[im 72/161  soft-tissue]
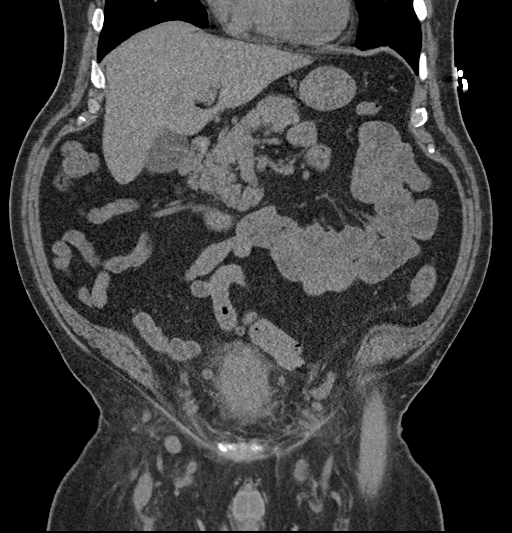
[im 89/161  soft-tissue]
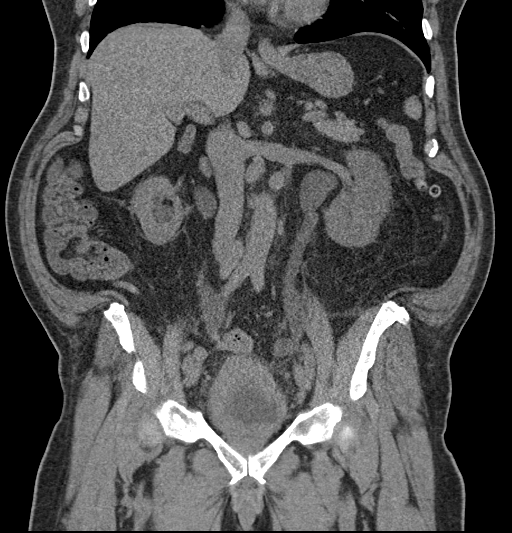

[15 of 46 positions shown; findings below may reference images not displayed]

FINDINGS: Lower chest: There is a partially visualized ground-glass airspace
opacity in the right lower lobe measuring approximately 1.4 cm
(axial series 4, image 1). There is atelectasis at the lung bases,
right worse than left.)The heart size is normal.

Hepatobiliary: The liver is normal. Cholelithiasis without acute
inflammation.There is no biliary ductal dilation.

Pancreas: Normal contours without ductal dilatation. No
peripancreatic fluid collection.

Spleen: No splenic laceration or hematoma.

Adrenals/Urinary Tract:

--Adrenal glands: No adrenal hemorrhage.

--Right kidney/ureter: There is moderate to severe right-sided
hydronephrosis without evidence of an obstructing stone. There is an
abrupt change in caliber of the distal right ureter proximal to the
right UVJ.

--Left kidney/ureter: There is moderate severe left-sided
hydronephrosis without evidence of an obstructing stone.

--Urinary bladder: There is diffuse asymmetric urinary bladder wall
thickening. There is adjacent fat stranding and free fluid.

Stomach/Bowel:

--Stomach/Duodenum: No hiatal hernia or other gastric abnormality.
Normal duodenal course and caliber.

--Small bowel: No dilatation or inflammation.

--Colon: Rectosigmoid diverticulosis without acute inflammation.

--Appendix: Normal.

Vascular/Lymphatic: Normal course and caliber of the major abdominal
vessels.

--there are multiple pathologically enlarged retroperitoneal lymph
nodes.

--No mesenteric lymphadenopathy.

--there are pathologically enlarged pelvic and inguinal lymph nodes.
For example there is a 1.6 cm enlarged left pelvic sidewall lymph
node.

Reproductive: The prostate gland is mildly enlarged measuring
approximately 4.2 by 3.8 cm.

Other: There is a trace amount of free fluid in the pelvis. The
abdominal wall is normal.

Musculoskeletal. There are extensive sclerotic osseous lesions
throughout essentially all of the visualized osseous structures.
There is no pathologic fracture identified at this time. Large
sclerotic lesions are noted involving both proximal femurs.
IMPRESSION: 1. Overall findings highly suspicious for metastatic bladder cancer.
There is moderate bilateral hydronephrosis. On the left, the level
of obstruction is at the urinary bladder. On the right, the level of
obstruction is proximal to the urinary bladder in the distal right
ureter, raising suspicion for an ascending transitional cell
carcinoma. There is diffuse asymmetric bladder wall thickening,
greatest along the right lateral bladder wall. In addition, there
are pathologically enlarged pelvic, inguinal, and retroperitoneal
lymph nodes.
2. Diffuse osseous sclerotic lesions involving virtually all of the
visualized osseous structures is highly concerning for osseous
metastatic disease. This is new since CT in [SZ].
3. Partially visualized ground-glass airspace opacity in the right
lower lobe as detailed above. This could represent a metastatic
focus within the thorax. Consider a nonemergent follow-up CT of the
chest for further evaluation.
4. Cholelithiasis without CT evidence for acute cholecystitis.
5. The prostate gland is only mildly enlarged.

## 2018-10-12 SURGERY — ORCHIECTOMY
Anesthesia: General | Laterality: Bilateral

## 2018-10-12 MED ORDER — ONDANSETRON HCL 4 MG/2ML IJ SOLN
INTRAMUSCULAR | Status: AC
  Filled 2018-10-12: qty 2

## 2018-10-12 MED ORDER — METOPROLOL TARTRATE 5 MG/5ML IV SOLN
5.0000 mg | Freq: Once | INTRAVENOUS | Status: AC
  Administered 2018-10-12: 5 mg via INTRAVENOUS
  Filled 2018-10-12: qty 5

## 2018-10-12 MED ORDER — ONDANSETRON HCL 4 MG/2ML IJ SOLN
INTRAMUSCULAR | Status: DC | PRN
  Administered 2018-10-12: 4 mg via INTRAVENOUS

## 2018-10-12 MED ORDER — HYDROCODONE-ACETAMINOPHEN 5-325 MG PO TABS
1.0000 | ORAL_TABLET | ORAL | Status: DC | PRN
  Administered 2018-10-13: 1 via ORAL
  Administered 2018-10-13: 2 via ORAL
  Administered 2018-10-13: 1 via ORAL
  Administered 2018-10-14 (×2): 2 via ORAL
  Filled 2018-10-12: qty 2
  Filled 2018-10-12 (×2): qty 1
  Filled 2018-10-12 (×2): qty 2

## 2018-10-12 MED ORDER — HYDROMORPHONE HCL 1 MG/ML IJ SOLN
INTRAMUSCULAR | Status: AC
  Filled 2018-10-12: qty 2

## 2018-10-12 MED ORDER — SODIUM CHLORIDE 0.9 % IV SOLN
INTRAVENOUS | Status: DC | PRN
  Administered 2018-10-12: 14:00:00 via INTRAVENOUS

## 2018-10-12 MED ORDER — HYDROMORPHONE HCL 1 MG/ML IJ SOLN
0.2500 mg | INTRAMUSCULAR | Status: DC | PRN
  Administered 2018-10-12 (×4): 0.5 mg via INTRAVENOUS

## 2018-10-12 MED ORDER — DEXAMETHASONE SODIUM PHOSPHATE 10 MG/ML IJ SOLN
INTRAMUSCULAR | Status: DC | PRN
  Administered 2018-10-12: 5 mg via INTRAVENOUS

## 2018-10-12 MED ORDER — ACETAMINOPHEN 325 MG PO TABS
650.0000 mg | ORAL_TABLET | Freq: Four times a day (QID) | ORAL | Status: DC | PRN
  Administered 2018-10-12 – 2018-10-13 (×2): 650 mg via ORAL
  Filled 2018-10-12 (×2): qty 2

## 2018-10-12 MED ORDER — PROMETHAZINE HCL 25 MG/ML IJ SOLN: 6.2500 mg | INTRAMUSCULAR | Status: DC | PRN

## 2018-10-12 MED ORDER — TAMSULOSIN HCL 0.4 MG PO CAPS
0.4000 mg | ORAL_CAPSULE | Freq: Every day | ORAL | Status: DC
  Administered 2018-10-12 – 2018-10-13 (×2): 0.4 mg via ORAL
  Filled 2018-10-12 (×2): qty 1

## 2018-10-12 MED ORDER — SODIUM CHLORIDE 0.9 % IV SOLN
INTRAVENOUS | Status: DC | PRN
  Administered 2018-10-12: 60 ug/min via INTRAVENOUS

## 2018-10-12 MED ORDER — ACETAMINOPHEN 10 MG/ML IV SOLN
1000.0000 mg | Freq: Once | INTRAVENOUS | Status: DC | PRN
  Administered 2018-10-12: 1000 mg via INTRAVENOUS

## 2018-10-12 MED ORDER — MIDAZOLAM HCL 2 MG/2ML IJ SOLN
INTRAMUSCULAR | Status: AC
  Filled 2018-10-12: qty 2

## 2018-10-12 MED ORDER — LEVOFLOXACIN IN D5W 500 MG/100ML IV SOLN
500.0000 mg | Freq: Once | INTRAVENOUS | Status: AC
  Administered 2018-10-12: 500 mg via INTRAVENOUS
  Filled 2018-10-12: qty 100

## 2018-10-12 MED ORDER — 0.9 % SODIUM CHLORIDE (POUR BTL) OPTIME
TOPICAL | Status: DC | PRN
  Administered 2018-10-12: 1000 mL

## 2018-10-12 MED ORDER — ONDANSETRON HCL 4 MG/2ML IJ SOLN: 4.0000 mg | Freq: Four times a day (QID) | INTRAMUSCULAR | Status: DC | PRN

## 2018-10-12 MED ORDER — BUPIVACAINE HCL (PF) 0.25 % IJ SOLN
INTRAMUSCULAR | Status: AC
  Filled 2018-10-12: qty 30

## 2018-10-12 MED ORDER — PROPOFOL 10 MG/ML IV BOLUS
INTRAVENOUS | Status: DC | PRN
  Administered 2018-10-12: 200 mg via INTRAVENOUS

## 2018-10-12 MED ORDER — FENTANYL CITRATE (PF) 100 MCG/2ML IJ SOLN
INTRAMUSCULAR | Status: DC | PRN
  Administered 2018-10-12: 50 ug via INTRAVENOUS
  Administered 2018-10-12: 100 ug via INTRAVENOUS
  Administered 2018-10-12: 50 ug via INTRAVENOUS

## 2018-10-12 MED ORDER — SODIUM ZIRCONIUM CYCLOSILICATE 5 G PO PACK
5.0000 g | PACK | Freq: Two times a day (BID) | ORAL | Status: DC
  Administered 2018-10-12 (×2): 5 g via ORAL
  Filled 2018-10-12 (×3): qty 1

## 2018-10-12 MED ORDER — MIDAZOLAM HCL 5 MG/5ML IJ SOLN
INTRAMUSCULAR | Status: DC | PRN
  Administered 2018-10-12: 2 mg via INTRAVENOUS

## 2018-10-12 MED ORDER — FENTANYL CITRATE (PF) 100 MCG/2ML IJ SOLN
INTRAMUSCULAR | Status: AC
  Filled 2018-10-12: qty 2

## 2018-10-12 MED ORDER — MEPERIDINE HCL 50 MG/ML IJ SOLN: 6.2500 mg | INTRAMUSCULAR | Status: DC | PRN

## 2018-10-12 MED ORDER — CALCIUM GLUCONATE-NACL 1-0.675 GM/50ML-% IV SOLN
1.0000 g | Freq: Once | INTRAVENOUS | Status: AC
  Administered 2018-10-12: 1000 mg via INTRAVENOUS
  Filled 2018-10-12: qty 50

## 2018-10-12 MED ORDER — HYDROCODONE-ACETAMINOPHEN 7.5-325 MG PO TABS: 1.0000 | ORAL_TABLET | Freq: Once | ORAL | Status: DC | PRN

## 2018-10-12 MED ORDER — SODIUM CHLORIDE 0.9 % IV SOLN
2.0000 g | INTRAVENOUS | Status: AC
  Administered 2018-10-12: 2 g via INTRAVENOUS
  Filled 2018-10-12: qty 2

## 2018-10-12 MED ORDER — ONDANSETRON HCL 4 MG PO TABS: 4.0000 mg | ORAL_TABLET | Freq: Four times a day (QID) | ORAL | Status: DC | PRN

## 2018-10-12 MED ORDER — LACTATED RINGERS IV SOLN
INTRAVENOUS | Status: DC
  Administered 2018-10-12 – 2018-10-13 (×3): via INTRAVENOUS

## 2018-10-12 MED ORDER — DEXAMETHASONE SODIUM PHOSPHATE 10 MG/ML IJ SOLN
INTRAMUSCULAR | Status: AC
  Filled 2018-10-12: qty 1

## 2018-10-12 MED ORDER — ACETAMINOPHEN 10 MG/ML IV SOLN
INTRAVENOUS | Status: AC
  Filled 2018-10-12: qty 100

## 2018-10-12 MED ORDER — STERILE WATER FOR INJECTION IV SOLN
INTRAVENOUS | Status: DC
  Administered 2018-10-12 – 2018-10-14 (×4): via INTRAVENOUS
  Filled 2018-10-12 (×7): qty 850

## 2018-10-12 MED ORDER — SODIUM CHLORIDE 0.9 % IV SOLN
2.0000 g | Freq: Two times a day (BID) | INTRAVENOUS | Status: DC
  Administered 2018-10-13 – 2018-10-14 (×3): 2 g via INTRAVENOUS
  Filled 2018-10-12 (×6): qty 2

## 2018-10-12 MED ORDER — INSULIN ASPART 100 UNIT/ML ~~LOC~~ SOLN
0.0000 [IU] | Freq: Three times a day (TID) | SUBCUTANEOUS | Status: DC
  Filled 2018-10-12: qty 0.09

## 2018-10-12 MED ORDER — PROPOFOL 10 MG/ML IV BOLUS
INTRAVENOUS | Status: AC
  Filled 2018-10-12: qty 20

## 2018-10-12 MED ORDER — ACETAMINOPHEN 650 MG RE SUPP: 650.0000 mg | Freq: Four times a day (QID) | RECTAL | Status: DC | PRN

## 2018-10-12 MED ORDER — SODIUM BICARBONATE 8.4 % IV SOLN
50.0000 meq | Freq: Once | INTRAVENOUS | Status: AC
  Administered 2018-10-12: 50 meq via INTRAVENOUS
  Filled 2018-10-12: qty 50

## 2018-10-12 MED ORDER — FINASTERIDE 5 MG PO TABS
5.0000 mg | ORAL_TABLET | Freq: Every day | ORAL | Status: DC
  Administered 2018-10-12 – 2018-10-14 (×3): 5 mg via ORAL
  Filled 2018-10-12 (×3): qty 1

## 2018-10-12 MED ORDER — LIDOCAINE 2% (20 MG/ML) 5 ML SYRINGE
INTRAMUSCULAR | Status: AC
  Filled 2018-10-12: qty 5

## 2018-10-12 MED ORDER — PHENYLEPHRINE 40 MCG/ML (10ML) SYRINGE FOR IV PUSH (FOR BLOOD PRESSURE SUPPORT)
PREFILLED_SYRINGE | INTRAVENOUS | Status: AC
  Filled 2018-10-12: qty 10

## 2018-10-12 MED ORDER — LIDOCAINE 2% (20 MG/ML) 5 ML SYRINGE
INTRAMUSCULAR | Status: DC | PRN
  Administered 2018-10-12: 100 mg via INTRAVENOUS

## 2018-10-12 MED ORDER — INSULIN ASPART 100 UNIT/ML ~~LOC~~ SOLN
0.0000 [IU] | SUBCUTANEOUS | Status: DC
  Administered 2018-10-12: 3 [IU] via SUBCUTANEOUS
  Administered 2018-10-13: 2 [IU] via SUBCUTANEOUS
  Filled 2018-10-12: qty 0.09

## 2018-10-12 MED ORDER — PHENAZOPYRIDINE HCL 200 MG PO TABS
200.0000 mg | ORAL_TABLET | Freq: Three times a day (TID) | ORAL | Status: DC
  Administered 2018-10-12 – 2018-10-13 (×4): 200 mg via ORAL
  Filled 2018-10-12 (×7): qty 1

## 2018-10-12 MED ORDER — HYDROMORPHONE HCL 1 MG/ML IJ SOLN: 0.2500 mg | INTRAMUSCULAR | Status: DC | PRN

## 2018-10-12 MED ORDER — SODIUM ZIRCONIUM CYCLOSILICATE 10 G PO PACK
10.0000 g | PACK | Freq: Once | ORAL | Status: AC
  Administered 2018-10-12: 10 g via ORAL
  Filled 2018-10-12: qty 1

## 2018-10-12 MED ORDER — HYDROMORPHONE HCL 1 MG/ML IJ SOLN
INTRAMUSCULAR | Status: AC
  Filled 2018-10-12: qty 1

## 2018-10-12 MED ORDER — BUPIVACAINE HCL (PF) 0.25 % IJ SOLN
INTRAMUSCULAR | Status: DC | PRN
  Administered 2018-10-12: 10 mL

## 2018-10-12 MED ORDER — SUCCINYLCHOLINE CHLORIDE 200 MG/10ML IV SOSY
PREFILLED_SYRINGE | INTRAVENOUS | Status: AC
  Filled 2018-10-12: qty 10

## 2018-10-12 MED ORDER — PHENYLEPHRINE HCL (PRESSORS) 10 MG/ML IV SOLN
INTRAVENOUS | Status: AC
  Filled 2018-10-12: qty 2

## 2018-10-12 SURGICAL SUPPLY — 45 items
BAG URINE DRAINAGE (UROLOGICAL SUPPLIES) ×4 IMPLANT
BAG URO CATCHER STRL LF (MISCELLANEOUS) ×4 IMPLANT
BLADE CLIPPER SURG (BLADE) ×4 IMPLANT
BNDG GAUZE ELAST 4 BULKY (GAUZE/BANDAGES/DRESSINGS) ×8 IMPLANT
CATH FOLEY 2WAY SLVR  5CC 18FR (CATHETERS) ×2
CATH FOLEY 2WAY SLVR 5CC 18FR (CATHETERS) ×2 IMPLANT
CATH FOLEY 3WAY 30CC 24FR (CATHETERS)
CATH URET 5FR 28IN OPEN ENDED (CATHETERS) ×4 IMPLANT
CATH URTH STD 24FR FL 3W 2 (CATHETERS) IMPLANT
CLOTH BEACON ORANGE TIMEOUT ST (SAFETY) ×4 IMPLANT
COVER MAYO STAND STRL (DRAPES) ×4 IMPLANT
COVER SURGICAL LIGHT HANDLE (MISCELLANEOUS) ×4 IMPLANT
COVER WAND RF STERILE (DRAPES) IMPLANT
DERMABOND ADVANCED (GAUZE/BANDAGES/DRESSINGS) ×2
DERMABOND ADVANCED .7 DNX12 (GAUZE/BANDAGES/DRESSINGS) ×2 IMPLANT
DRAIN PENROSE 18X1/4 LTX STRL (WOUND CARE) ×4 IMPLANT
DRAPE LAPAROTOMY T 98X78 PEDS (DRAPES) IMPLANT
ELECT PENCIL ROCKER SW 15FT (MISCELLANEOUS) ×4 IMPLANT
ELECT REM PT RETURN 15FT ADLT (MISCELLANEOUS) ×4 IMPLANT
GAUZE SPONGE 4X4 12PLY STRL (GAUZE/BANDAGES/DRESSINGS) ×4 IMPLANT
GLOVE BIO SURGEON STRL SZ7.5 (GLOVE) ×8 IMPLANT
GOWN STRL REUS W/TWL LRG LVL3 (GOWN DISPOSABLE) ×8 IMPLANT
GOWN STRL REUS W/TWL XL LVL3 (GOWN DISPOSABLE) ×4 IMPLANT
GUIDEWIRE STR DUAL SENSOR (WIRE) ×4 IMPLANT
HOLDER FOLEY CATH W/STRAP (MISCELLANEOUS) ×4 IMPLANT
KIT BASIN OR (CUSTOM PROCEDURE TRAY) ×4 IMPLANT
KIT TURNOVER KIT A (KITS) ×4 IMPLANT
LOOP CUT BIPOLAR 24F LRG (ELECTROSURGICAL) IMPLANT
MANIFOLD NEPTUNE II (INSTRUMENTS) ×4 IMPLANT
NEEDLE HYPO 22GX1.5 SAFETY (NEEDLE) ×4 IMPLANT
PACK CYSTO (CUSTOM PROCEDURE TRAY) ×4 IMPLANT
SOL PREP PROV IODINE SCRUB 4OZ (MISCELLANEOUS) IMPLANT
SPONGE LAP 18X18 X RAY DECT (DISPOSABLE) ×4 IMPLANT
SUPPORT SCROTAL LG STRP (MISCELLANEOUS) ×3 IMPLANT
SUPPORTER ATHLETIC LG (MISCELLANEOUS) ×1
SUT CHROMIC 3 0 SH 27 (SUTURE) ×8 IMPLANT
SUT VIC AB 2-0 UR5 27 (SUTURE) ×4 IMPLANT
SUT VICRYL 0 TIES 12 18 (SUTURE) ×4 IMPLANT
SYR CONTROL 10ML LL (SYRINGE) ×4 IMPLANT
SYRINGE IRR TOOMEY STRL 70CC (SYRINGE) IMPLANT
TOWEL OR 17X26 10 PK STRL BLUE (TOWEL DISPOSABLE) ×4 IMPLANT
TUBING CONNECTING 10 (TUBING) ×3 IMPLANT
TUBING CONNECTING 10' (TUBING) ×1
TUBING UROLOGY SET (TUBING) ×4 IMPLANT
YANKAUER SUCT BULB TIP 10FT TU (MISCELLANEOUS) ×4 IMPLANT

## 2018-10-12 NOTE — Progress Notes (Signed)
Pharmacy Antibiotic Note  Jeffery [name] is a 58 y.o. male admitted on 10/11/2018 with UTI.  Pharmacy has been consulted for cefepime dosing.  Plan: Cefepime 2 gm IV q12  Weight: 220 lb 3.8 oz (99.9 kg)  Temp (24hrs), Avg:97.9 F (36.6 C), Min:97.9 F (36.6 C), Max:97.9 F (36.6 C)  Recent Labs  Lab 10/11/18 2250 10/11/18 2323 10/12/18 0448  WBC 11.3*  --   --   CREATININE 2.06*  --  2.03*  LATICACIDVEN  --  0.8 0.8    Estimated Creatinine Clearance: 46.9 mL/min (A) (by C-G formula based on SCr of 2.03 mg/dL (H)).    No Known Allergies  Antimicrobials this admission: 6/29 LVQ x 1 6/29 Cefepime >> Dose adjustments this admission:  Microbiology results: 6/29 COVID> sent out 6/29 BCx2: sent 6/28 UCx: sent  Thank you for allowing pharmacy to be a part of this patient's care  Eudelia Bunch, Pharm.D [phone] 10/12/2018 7:40 AM

## 2018-10-12 NOTE — ED Notes (Signed)
Patient transported to CT 

## 2018-10-12 NOTE — Op Note (Signed)
Operative Note  Preoperative diagnosis:  1.  Metastatic prostate cancer 2.  Bilateral hydronephrosis 3.  Possible urinary retention  Postoperative diagnosis: 1.  Metastatic prostate cancer 2.  Bilateral hydronephrosis 3.  Possible urinary retention 4.  Urethral stricture  Procedure(s): 1.  Bilateral simple scrotal orchiectomy 2.  Cystoscopy with urethral dilation  Surgeon: Link Snuffer, MD  Assistants: Adair Laundry, MD, resident  Anesthesia: General  Complications: None immediate  EBL: Minimal  Specimens: 1.  None  Drains/Catheters: 1.  18 French Foley catheter  Intraoperative findings: 1.  Anterior urethra was tight.  Unable to pass the scope.  Most of the tightness was around the fossa navicularis.  This was dilated to 30 [place].  I was then able to pass the scope but it was still little bit tight but the scope was able to passively dilate the urethra. 2.  Nonobstructing prostate 3.  Multiple changes throughout the bladder consistent with locally advanced prostate cancer.  There was a great deal of inflammation around the area of the ureteral orifices.  I was unable to identify bilateral ureteral orifices.  Therefore unable to place stents.  Indication: 58 year old male who I was following in clinic for a PSA of 410 recently underwent a prostate biopsy that revealed cancer in every core.  Maximum Gleason score was 9.  He presented to the emergency department and was admitted to the hospital with renal insufficiency and hyperkalemia.  Given these findings, he elected to undergo bilateral scrotal orchiectomy for treatment of his prostate cancer and to also undergo attempted ureteral stent placement for his bilateral hydronephrosis.  He was also consented for possible resection of the prostate if there was evidence of obstruction.  Description of procedure:  The patient was identified and consent was obtained.  The patient was taken to the operating room and placed in the  supine position.  The patient was placed under general anesthesia.  Perioperative antibiotics were administered.  The patient was placed in dorsal lithotomy.  Patient was prepped and draped in a standard sterile fashion and a timeout was performed.  A 3 cm scrotal incision was made along the median raphae and carried down through the dark toes and the left testicle was delivered.  Gubernacular attachments were released.  The cord was exposed.  The vas deferens was isolated separately and a Kelly clamp was clamped around it.  2 Kelly clamps were placed on the vascular portion of the cord.  The testicle was excised and passed off for specimen.  An 0 Vicryl tie was used to ligate the vas deferens and the clamp was released.  A 2-0 Vicryl stick tie was used to tie the more distal portion of the vascular cord and then the distal clamp was released.  A 0 Vicryl suture was used to suture ligate the more proximal portion and the clamp was released.  There was no bleeding from the stump.  I then came down through the dartos on the right and delivered the right testicle.  Gubernacular attachments were released.  The vas deferens was isolated and a clamp placed around the.  2 clamps were placed around the vascular portion of the cord.  The testicle was excised and passed off for specimen.  The vas deferens was suture ligated with a 0 Vicryl suture.  The distal portion of the vascular cord was first ligated with a stick tie.  An 0 Vicryl was then used to ligate the more proximal portion and the clamp was released.  There  was no bleeding from the vascular stump.  Hemostasis was obtained with Bovie electrocautery.  The dartos was closed with a 3-0 chromic suture followed by closure of the skin with a running 3-0 chromic.  Quarter percent Marcaine was instilled for anesthetic effect.  Dermabond was applied at the end of the case after cystoscopy.  A 21 French rigid cystoscope attempted to be passed into the urethra but there  was resistance.  Therefore I dilated the urethra with sounds.  I was then able to pass the cystoscope into the urethra.  There was still some mild resistance but I was able to pass it past the prostate and into the bladder.  The bladder was systematically inspected.  There was diffuse edema.  No evidence of bladder cancer.  The appearance was consistent with locally advanced prostate cancer.  I was unable to locate bilateral ureteral orifices.  I therefore aborted this portion of the operation.  I inspected the prostate upon removal of the scope and it did not appear large or obstructing.  The urethra was then wide open.  I placed an 50 French urethral catheter and this concluded the operation.  The patient tolerated the procedure well was stable postoperatively.  Plan: Patient should keep the Foley catheter for at least 1 week.  He would likely need bilateral nephrostomy tubes given we were unable to access the ureters from a retrograde approach.  He will need referral to medical oncology.  We will see if social work can work with him to see if he is eligible for any type of insurance.

## 2018-10-12 NOTE — ED Provider Notes (Signed)
[facility] DEPT Provider Note   CSN: [id number] Arrival date & time: 10/11/18  2218    History   Chief Complaint Chief Complaint  Patient presents with   Urinary Retention    HPI Jeffery [name] is a 58 y.o. male.     HPI  58 year old with history of enlarged prostate, diabetes, hyperlipidemia who is status post prostate biopsy from 2 weeks ago comes in a chief complaint urinary retention.  Patient states that he has been having increased frequency and urgency to go to the bathroom.  When he does go urinate rarely any urine output occurs.  He spoke with his urologist and he was advised to perhaps consider self cath and he has been doing so with minimal voiding.  She is concerned that he is retaining urine.  He denies any abdominal discomfort with it.  Review of system is positive for hematuria and dysuria.  Past Medical History:  Diagnosis Date   Diabetes mellitus, new onset ([place]) 07/2016   Enlarged prostate    HLD (hyperlipidemia)     Patient Active Problem List   Diagnosis Date Noted   DM2 (diabetes mellitus, type 2) ([place]) 10/12/2018   Dysuria 10/12/2018   CKD (chronic kidney disease) stage 3, GFR 30-59 ml/min ([facility]) 10/12/2018   Hyperkalemia 09/73/5329   Metabolic acidosis, normal anion gap (NAG) 10/12/2018   Sinus tachycardia 07/31/2016   Diabetes mellitus, new onset ([place])    Hyperglycemia 07/29/2016   Urinary retention 07/29/2016   AKI (acute kidney injury) ([place]) 07/29/2016   Cholelithiasis 07/29/2016   Fatty liver 07/29/2016   Hydronephrosis 07/29/2016   Hypertension 07/29/2016    Past Surgical History:  Procedure Laterality Date   KNEE ARTHROSCOPY Right         Home Medications    Prior to Admission medications   Medication Sig Start Date End Date Taking? Authorizing Provider  Blood Glucose Monitoring Suppl (RELION CONFIRM GLUCOSE MONITOR) w/Device KIT Use 4 times daily before meals and bedtime  as directed. 07/31/16   Holley Raring, MD  docusate sodium (COLACE) 250 MG capsule Take 250 mg by mouth daily.    [provider]  finasteride (PROSCAR) 5 MG tablet Take 5 mg by mouth daily. 02/12/18   [provider]  glucose blood (RELION GLUCOSE TEST STRIPS) test strip Use as instructed 07/31/16   Holley Raring, MD  Lancets 30G MISC Use 4 times daily before meals as directed. 07/31/16   Holley Raring, MD  lisinopril (PRINIVIL,ZESTRIL) 20 MG tablet Take 0.5 tablets by mouth daily. 04/27/18   [provider]  metFORMIN (GLUCOPHAGE-XR) 500 MG 24 hr tablet Take 1 tablet (558m) daily for the next 4 days, then begin taking 1 tablet (5078m two times every day (for a total of 100077maily). 07/31/16   StrHolley RaringD  simvastatin (ZOCOR) 20 MG tablet Take 1 tablet by mouth daily. 04/06/18   [provider]  tamsulosin (FLOMAX) 0.4 MG CAPS capsule Take 1 capsule (0.4 mg total) by mouth daily after supper. 07/31/16   StrHolley RaringD    Family History Family History  Problem Relation Age of Onset   Prostate cancer Father    Heart disease Father     Social History Social History   Tobacco Use   Smoking status: Former Smoker    Types: Cigarettes    Quit date: 2015    Years since quitting: 5.4   Smokeless tobacco: Never Used   Tobacco comment: 2015  Substance Use Topics  Alcohol use: No   Drug use: No     Allergies   Patient has no known allergies.   Review of Systems Review of Systems  Constitutional: Positive for activity change.  Respiratory: Negative for shortness of breath.   Cardiovascular: Negative for chest pain.  Gastrointestinal: Positive for nausea. Negative for abdominal pain and vomiting.  Genitourinary: Positive for decreased urine volume, dysuria, frequency and urgency.  Allergic/Immunologic: Negative for immunocompromised state.  All other systems reviewed and are negative.    Physical Exam Updated Vital  Signs BP 122/85    Pulse (!) 118    Temp 97.9 F (36.6 C) (Oral)    Resp 18    SpO2 97%   Physical Exam Vitals signs and nursing note reviewed.  Constitutional:      Appearance: He is well-developed.  HENT:     Head: Atraumatic.  Neck:     Musculoskeletal: Neck supple.  Cardiovascular:     Rate and Rhythm: Normal rate.  Pulmonary:     Effort: Pulmonary effort is normal.  Abdominal:     Tenderness: There is no abdominal tenderness.  Skin:    General: Skin is warm.  Neurological:     Mental Status: He is alert and oriented to person, place, and time.      ED Treatments / Results  Labs (all labs ordered are listed, but only abnormal results are displayed) Labs Reviewed  URINALYSIS, ROUTINE W REFLEX MICROSCOPIC - Abnormal; Notable for the following components:      Result Value   APPearance CLOUDY (*)    Hgb urine dipstick MODERATE (*)    Protein, ur 100 (*)    Leukocytes,Ua TRACE (*)    RBC / HPF >50 (*)    WBC, UA >50 (*)    Bacteria, UA RARE (*)    All other components within normal limits  BASIC METABOLIC PANEL - Abnormal; Notable for the following components:   Potassium 5.9 (*)    Chloride 115 (*)    CO2 16 (*)    Glucose, Bld 134 (*)    BUN 47 (*)    Creatinine, Ser 2.06 (*)    GFR calc non Af Amer 35 (*)    GFR calc Af Amer 41 (*)    All other components within normal limits  CBC WITH DIFFERENTIAL/PLATELET - Abnormal; Notable for the following components:   WBC 11.3 (*)    Hemoglobin 11.2 (*)    HCT 38.8 (*)    MCH 25.3 (*)    MCHC 28.9 (*)    RDW 16.1 (*)    Platelets 416 (*)    Abs Immature Granulocytes 0.26 (*)    All other components within normal limits  URINE CULTURE  CULTURE, BLOOD (ROUTINE X 2)  CULTURE, BLOOD (ROUTINE X 2)  MRSA PCR SCREENING  NOVEL CORONAVIRUS, NAA (HOSPITAL ORDER, SEND-OUT TO REF LAB)  LACTIC ACID, PLASMA  LACTIC ACID, PLASMA  BASIC METABOLIC PANEL  HIV ANTIBODY (ROUTINE TESTING W REFLEX)  HEMOGLOBIN A1C     EKG EKG Interpretation  Date/Time:  Sunday October 11 2018 22:31:37 EDT Ventricular Rate:  133 PR Interval:    QRS Duration: 83 QT Interval:  281 QTC Calculation: 418 R Axis:   56 Text Interpretation:  Sinus tachycardia Low voltage, extremity and precordial leads Since last tracing rate slower Confirmed by Daleen Bo [phone]) on 10/11/2018 10:37:30 PM   Radiology No results found.  Procedures Procedures (including critical care time)  EMERGENCY DEPARTMENT ULTRASOUND  Study:  Limited Ultrasound of Bladder  INDICATIONS: to assess for urinary retention and/or bladder volume prior to urinary catheter Multiple views of the bladder were obtained in real-time in the transverse and longitudinal planes with a multi-frequency probe.  PERFORMED BY: Myself IMAGES ARCHIVED?: Yes LIMITATIONS:  none INTERPRETATION: Minimal Volume It appeared that patient's total urine volume was less than 100 cc.   Medications Ordered in ED Medications  phenazopyridine (PYRIDIUM) tablet 200 mg (has no administration in time range)  sodium zirconium cyclosilicate (LOKELMA) packet 10 g (has no administration in time range)  calcium gluconate 1 g/ 50 mL sodium chloride IVPB (has no administration in time range)  sodium bicarbonate injection 50 mEq (has no administration in time range)  sodium bicarbonate 150 mEq in sterile water 1,000 mL infusion (has no administration in time range)  tamsulosin (FLOMAX) capsule 0.4 mg (has no administration in time range)  finasteride (PROSCAR) tablet 5 mg (has no administration in time range)  acetaminophen (TYLENOL) tablet 650 mg (has no administration in time range)    Or  acetaminophen (TYLENOL) suppository 650 mg (has no administration in time range)  ondansetron (ZOFRAN) tablet 4 mg (has no administration in time range)    Or  ondansetron (ZOFRAN) injection 4 mg (has no administration in time range)  insulin aspart (novoLOG) injection 0-9 Units (has no  administration in time range)  ceFEPIme (MAXIPIME) 2 g in sodium chloride 0.9 % 100 mL IVPB (has no administration in time range)  sodium chloride 0.9 % bolus 1,000 mL (1,000 mLs Intravenous New Bag/Given 10/12/18 0038)  levofloxacin (LEVAQUIN) IVPB 500 mg (500 mg Intravenous New Bag/Given 10/12/18 0040)     Initial Impression / Assessment and Plan / ED Course  I have reviewed the triage vital signs and the nursing notes.  Pertinent labs & imaging results that were available during my care of the patient were reviewed by me and considered in my medical decision making (see chart for details).        58 year old male comes in a chief complaint of urinary discomfort.  He has history of diabetes and recent diagnosis of likely prostate cancer.  Patient is status post prostate biopsy from about 2 weeks ago.  Based on history and exam it appears that he is having UTI/cystitis.  Prostatitis is also possible given the recent biopsy.  He is noted to be tachycardic, labs however overall are reassuring and patient does not appear to be having severe sepsis.  The labs however do indicate that he has a bicarb of 15, potassium of 5.7 and the kidney function is slightly elevated from the baseline.  The elevated kidney function is likely due to prerenal cause and not infection.   2:17 AM Patient was given normal saline 1 L bolus.  His heart rate improved from 1 40-1 20.  Still appears uncomfortable.  Given the associated lab abnormalities with the tachycardia, we have requested hospitalist to admit the patient.  The hospitalist team has requested a CT renal stone which has been ordered. Final Clinical Impressions(s) / ED Diagnoses   Final diagnoses:  Prostatitis, acute  Acute cystitis with hematuria  Acute hyperkalemia    ED Discharge Orders    None       Varney Biles, MD 10/12/18 [phone]

## 2018-10-12 NOTE — Progress Notes (Signed)
I have seen and assessed patient and agree with Dr. Juleen China assessment and plan.  Patient is a 58 year old gentleman history of urinary retention, bilateral hydronephrosis, history of prostate cancer with a Gleason score of 9, PSA of 410 presented to the ED with urinary retention.  Bladder scan initially showed no urine in the bladder.  Repeat bladder scan with 80 cc in the bladder.  Urinalysis concerning for UTI versus prostatitis.  Patient also noted to have acute kidney injury on chronic kidney disease stage III with a non-anion gap metabolic acidosis with hyperkalemia concerning for obstructive uropathy versus type IV RTA.  CT renal stone protocol suspicious for metastatic bladder cancer with moderate bilateral hydronephrosis, diffuse asymmetric bladder wall thickening with pathologically enlarged pelvic, inguinal, retroperitoneal lymph nodes.  Diffuse osseous sclerotic lesions involving virtually all of the visualized osseous structures highly concerning for osseous metastatic disease new since CT of 2018.  Groundglass opacity in the right lower lobe.  Prostate gland only mildly enlarged.  Urology consulted urology concerned about metastatic prostate cancer with secondary bone mets.  Urology discussed androgen deprivation therapy versus bilateral orchiectomy with patient and per urology note patient elected for bilateral orchiectomy and plan for surgery today for bilateral orchiectomy, cystoscopy with possible bilateral ureteral stent placement, possible channel transurethral resection of prostate. We will place patient on Lokelma.  Continue bicarb drip.  Will not resume patient's lisinopril due to presentation with hyperkalemia.  Continue empiric IV cefepime pending urine cultures and blood cultures.  Will check a urine microalbumin to creatinine ratio.  May need outpatient nephrology follow-up.  Follow.  No charge.

## 2018-10-12 NOTE — H&P (Addendum)
History and Physical    Jeffery [name]:[id number] DOB: 14-Sep-1960 DOA: 10/11/2018  PCP: [name], MD  Patient coming from: Home  I have personally briefly reviewed patient's old medical records in [place]  Chief Complaint: Urinary retention  HPI: Jeffery [name] is a 58 y.o. male with medical history significant of DM2, enlarged prostate.  Patient s/p prostate Bx from 2 weeks ago.  Presents with c/o [place] retention.  Patient states that he has been having increased frequency and urgency to go to the bathroom.  When he does go urinate rarely any urine output occurs.  He spoke with his urologist and he was advised to perhaps consider self cath and he has been doing so with minimal voiding.  He is concerned that he is retaining urine.  He denies any abdominal discomfort with it.  Review of system is positive for hematuria and dysuria.   ED Course: Bladder scan initially showed no urine in bladder, then repeat showed just 80 cc in bladder.  UA shows trace LE, mod blood, >50 RBC and WBC.  Rare bacteria  WBC 11.  K 5.9, bicarb 16, creat 2.0 up from baseline 1.5.  Was 2.0 in March this year but looks like he was having mod R and mild L hydronephrosis at that time as well.  Persistently tachy to 116.  Given Bicarb, calcium gluconate, lokelma for hyperkalemia.  Given levaquin for UTI vs Prostatitis.   Review of Systems: As per HPI otherwise 10 point review of systems negative.   Past Medical History:  Diagnosis Date  . Diabetes mellitus, new onset ([place]) 07/2016  . Enlarged prostate   . HLD (hyperlipidemia)     Past Surgical History:  Procedure Laterality Date  . KNEE ARTHROSCOPY Right      reports that he quit smoking about 5 years ago. His smoking use included cigarettes. He has never used smokeless tobacco. He reports that he does not drink alcohol or use drugs.  No Known Allergies  Family History  Problem Relation Age of Onset  . Prostate  cancer Father   . Heart disease Father      Prior to Admission medications   Medication Sig Start Date End Date Taking? Authorizing Provider  Blood Glucose Monitoring Suppl ([facility] CONFIRM GLUCOSE MONITOR) w/Device KIT Use 4 times daily before meals and bedtime as directed. 07/31/16   Holley Raring, MD  docusate sodium (COLACE) 250 MG capsule Take 250 mg by mouth daily.    [provider]  finasteride (PROSCAR) 5 MG tablet Take 5 mg by mouth daily. 02/12/18   [provider]  glucose blood (RELION GLUCOSE TEST STRIPS) test strip Use as instructed 07/31/16   Holley Raring, MD  Lancets 30G MISC Use 4 times daily before meals as directed. 07/31/16   Holley Raring, MD  lisinopril (PRINIVIL,ZESTRIL) 20 MG tablet Take 0.5 tablets by mouth daily. 04/27/18   [provider]  metFORMIN (GLUCOPHAGE-XR) 500 MG 24 hr tablet Take 1 tablet (524m) daily for the next 4 days, then begin taking 1 tablet (5036m two times every day (for a total of 100052maily). 07/31/16   StrHolley RaringD  simvastatin (ZOCOR) 20 MG tablet Take 1 tablet by mouth daily. 04/06/18   [provider]  tamsulosin (FLOMAX) 0.4 MG CAPS capsule Take 1 capsule (0.4 mg total) by mouth daily after supper. 07/31/16   StrHolley RaringD    Physical Exam: Vitals:   10/11/18 2224 10/11/18 2300 10/11/18 2328 10/12/18  0030  BP: 114/79 110/74 123/83 122/85  Pulse:  (!) 130 (!) 118 (!) 118  Resp:  (!) 21 17 18   Temp:      TempSrc:      SpO2:  95% 96% 97%    Constitutional: NAD, calm, comfortable Eyes: PERRL, lids and conjunctivae normal ENMT: Mucous membranes are moist. Posterior pharynx clear of any exudate or lesions.Normal dentition.  Neck: normal, supple, no masses, no thyromegaly Respiratory: clear to auscultation bilaterally, no wheezing, no crackles. Normal respiratory effort. No accessory muscle use.  Cardiovascular: Regular rate and rhythm, no murmurs / rubs / gallops. No extremity edema. 2+  pedal pulses. No carotid bruits.  Abdomen: no tenderness, no masses palpated. No hepatosplenomegaly. Bowel sounds positive.  Musculoskeletal: no clubbing / cyanosis. No joint deformity upper and lower extremities. Good ROM, no contractures. Normal muscle tone.  Skin: no rashes, lesions, ulcers. No induration Neurologic: CN 2-12 grossly intact. Sensation intact, DTR normal. Strength 5/5 in all 4.  Psychiatric: Normal judgment and insight. Alert and oriented x 3. Normal mood.    Labs on Admission: I have personally reviewed following labs and imaging studies  CBC: Recent Labs  Lab 10/11/18 2250  WBC 11.3*  NEUTROABS 7.4  HGB 11.2*  HCT 38.8*  MCV 87.8  PLT 563*   Basic Metabolic Panel: Recent Labs  Lab 10/11/18 2250  NA 141  K 5.9*  CL 115*  CO2 16*  GLUCOSE 134*  BUN 47*  CREATININE 2.06*  CALCIUM 9.1   GFR: CrCl cannot be calculated (Unknown ideal weight.). Liver Function Tests: No results for input(s): AST, ALT, ALKPHOS, BILITOT, PROT, ALBUMIN in the last 168 hours. No results for input(s): LIPASE, AMYLASE in the last 168 hours. No results for input(s): AMMONIA in the last 168 hours. Coagulation Profile: No results for input(s): INR, PROTIME in the last 168 hours. Cardiac Enzymes: No results for input(s): CKTOTAL, CKMB, CKMBINDEX, TROPONINI in the last 168 hours. BNP (last 3 results) No results for input(s): PROBNP in the last 8760 hours. HbA1C: No results for input(s): HGBA1C in the last 72 hours. CBG: No results for input(s): GLUCAP in the last 168 hours. Lipid Profile: No results for input(s): CHOL, HDL, LDLCALC, TRIG, CHOLHDL, LDLDIRECT in the last 72 hours. Thyroid Function Tests: No results for input(s): TSH, T4TOTAL, FREET4, T3FREE, THYROIDAB in the last 72 hours. Anemia Panel: No results for input(s): VITAMINB12, FOLATE, FERRITIN, TIBC, IRON, RETICCTPCT in the last 72 hours. Urine analysis:    Component Value Date/Time   COLORURINE YELLOW 10/11/2018  2255   APPEARANCEUR CLOUDY (A) 10/11/2018 2255   LABSPEC 1.011 10/11/2018 2255   PHURINE 5.0 10/11/2018 2255   GLUCOSEU NEGATIVE 10/11/2018 2255   HGBUR MODERATE (A) 10/11/2018 2255   BILIRUBINUR NEGATIVE 10/11/2018 2255   KETONESUR NEGATIVE 10/11/2018 2255   PROTEINUR 100 (A) 10/11/2018 2255   NITRITE NEGATIVE 10/11/2018 2255   LEUKOCYTESUR TRACE (A) 10/11/2018 2255    Radiological Exams on Admission: No results found.  EKG: Independently reviewed.  Assessment/Plan Principal Problem:   Dysuria Active Problems:   DM2 (diabetes mellitus, type 2) (HCC)   CKD (chronic kidney disease) stage 3, GFR 30-59 ml/min (HCC)   Hyperkalemia   Metabolic acidosis, normal anion gap (NAG)    1. Dysuria - 1. UTI vs prostatitis 2. Cefepime 3. MRSA PCR nares pending 2. CKD stage 3 - ? AKI 1. Associated NAG metabolic acidosis with hyperkalemia 2. ? Retention / obstructive uropathy? Vs type 4 RTA 3. Bicarb gtt at 100  cc/hr 4. CT renal stone protocol to r/o retention / hydronephrosis 5. Repeat BMP in AM 6. If no obstruction, may need nephrology consult in AM 3. Hyperkalemia - 1. Tele monitor 2. 1L NS bolus in ED 3. 1 amp bicarb 4. Bicarb gtt 5. Calcium gluconate 6. Lokelma 7. Repeat BMP in AM 8. Hold ACEi 9. Creat of 2.0, BUN 40.  Not where id usually expect to see a need for dialysis.  GFR still listed as 35, will keep over here at [facility] for the moment. 4. DM2 - 1. Hold metformin 2. Sensitive SSI Q4H  DVT prophylaxis: SCDs Code Status: Full Family Communication: No family in room Disposition Plan: Home after admit Consults called: None Admission status: Place in 63     [place], [place] [facility]  How to contact the [facility] Attending or Consulting provider [place] or covering provider during after hours [place], for this patient?  1. Check the care team in [facility] and look for a) attending/consulting [facility] provider listed and b) the [facility] team listed 2. Log into  [email or web address]  Amion Physician Scheduling and messaging for groups and whole hospitals  On call and physician scheduling software for group practices, residents, hospitalists and other medical providers for call, clinic, rotation and shift schedules. OnCall [employer] is a hospital-wide system for scheduling doctors and paging doctors on call. EasyPlot is for scientific plotting and data analysis.  [email or web address]  and use [place]'s universal password to access. If you do not have the password, please contact the hospital operator.  3. Locate the [facility] provider you are looking for under [facility] and page to a number that you can be directly reached. 4. If you still have difficulty reaching the provider, please page the [facility] (Director on Call) for the Hospitalists listed on amion for assistance.  10/12/2018, 2:24 AM

## 2018-10-12 NOTE — Transfer of Care (Signed)
Immediate Anesthesia Transfer of Care Note  Patient: Jeffery [name]  Procedure(s) Performed: ORCHIECTOMY (Bilateral ) CYSTOSCOPY WITH URETHRAL DILATION  Patient Location: PACU  Anesthesia Type:General  Level of Consciousness: sedated  Airway & Oxygen Therapy: Patient Spontanous Breathing and Patient connected to face mask oxygen  Post-op Assessment: Report given to RN and Post -op Vital signs reviewed and stable  Post vital signs: Reviewed and stable  Last Vitals:  Vitals Value Taken Time  BP    Temp    Pulse    Resp    SpO2      Last Pain:  Vitals:   10/12/18 1317  TempSrc:   PainSc: 0-No pain      Patients Stated Pain Goal: 3 ([id number])  Complications: No apparent anesthesia complications

## 2018-10-12 NOTE — Anesthesia Preprocedure Evaluation (Signed)
Anesthesia Evaluation  Patient identified by MRN, date of birth, ID band Patient awake    Reviewed: Allergy & Precautions, NPO status , Patient's Chart, lab work & pertinent test results  Airway Mallampati: II  TM Distance: >3 FB Neck ROM: Full    Dental no notable dental hx. (+) Poor Dentition, Chipped, Missing   Pulmonary former smoker,    Pulmonary exam normal breath sounds clear to auscultation       Cardiovascular Exercise Tolerance: Good hypertension, Pt. on medications Normal cardiovascular exam Rhythm:Regular Rate:Normal     Neuro/Psych negative neurological ROS  negative psych ROS   GI/Hepatic negative GI ROS, Neg liver ROS,   Endo/Other  diabetes, Type 2  Renal/GU CRFRenal disease     Musculoskeletal   Abdominal (+) + obese,   Peds  Hematology negative hematology ROS (+)   Anesthesia Other Findings   Reproductive/Obstetrics                             Lab Results  Component Value Date   CREATININE 2.03 (H) 10/12/2018   BUN 46 (H) 10/12/2018   NA 143 10/12/2018   K 5.6 (H) 10/12/2018   CL 115 (H) 10/12/2018   CO2 20 (L) 10/12/2018    Lab Results  Component Value Date   WBC 11.3 (H) 10/11/2018   HGB 11.2 (L) 10/11/2018   HCT 38.8 (L) 10/11/2018   MCV 87.8 10/11/2018   PLT 416 (H) 10/11/2018    Anesthesia Physical Anesthesia Plan  ASA: III  Anesthesia Plan: General   Post-op Pain Management:  [facility]ional for Post-op pain   Induction: Intravenous  PONV Risk Score and Plan: Treatment may vary due to age or medical condition and Ondansetron  Airway Management Planned: LMA  Additional Equipment:   Intra-op Plan:   Post-operative Plan:   Informed Consent: I have reviewed the patients History and Physical, chart, labs and discussed the procedure including the risks, benefits and alternatives for the proposed anesthesia with the patient or authorized  representative who has indicated his/her understanding and acceptance.     Dental advisory given  Plan Discussed with: CRNA  Anesthesia Plan Comments:         Anesthesia Quick Evaluation

## 2018-10-12 NOTE — Progress Notes (Signed)
CT scan looks like metastatic bladder CA with B ureteral obstruction and hydronephrosis!  (explaining the presentation looking like type 4 RTA and UA findings).  Giving Dr. Jeffie Pollock a page.  Spoke with Dr. Jeffie Pollock:  NPO No anticoagulants Sending him a staff message, and urology will see in AM. And will go let patient know the news of findings thus far.

## 2018-10-12 NOTE — Anesthesia Procedure Notes (Signed)
Procedure Name: LMA Insertion Date/Time: 10/12/2018 1:50 PM Performed by: Gwyndolyn Saxon, CRNA Pre-anesthesia Checklist: Patient identified, Emergency Drugs available, Suction available and Patient being monitored Patient Re-evaluated:Patient Re-evaluated prior to induction Oxygen Delivery Method: Circle system utilized Preoxygenation: Pre-oxygenation with 100% oxygen Induction Type: IV induction Ventilation: Mask ventilation without difficulty LMA: LMA with gastric port inserted LMA Size: 4.0 Number of attempts: 1 Airway Equipment and Method: Patient positioned with wedge pillow Placement Confirmation: positive ETCO2 and breath sounds checked- equal and bilateral Tube secured with: Tape Dental Injury: Teeth and Oropharynx as per pre-operative assessment  Comments: Poor dentition noted in preop; teeth intact after LMA insertion

## 2018-10-12 NOTE — Anesthesia Postprocedure Evaluation (Signed)
Anesthesia Post Note  Patient: Jeffery [name]  Procedure(s) Performed: ORCHIECTOMY (Bilateral ) CYSTOSCOPY WITH URETHRAL DILATION     Patient location during evaluation: PACU Anesthesia Type: General Level of consciousness: awake and alert Pain management: pain level controlled Vital Signs Assessment: post-procedure vital signs reviewed and stable Respiratory status: spontaneous breathing, nonlabored ventilation, respiratory function stable and patient connected to nasal cannula oxygen Cardiovascular status: blood pressure returned to baseline and stable Postop Assessment: no apparent nausea or vomiting Anesthetic complications: no    Last Vitals:  Vitals:   10/12/18 1615 10/12/18 1646  BP: 137/82 (!) 150/94  Pulse: 100 (!) 102  Resp: 16 16  Temp:  36.5 C  SpO2: 92% 100%    Last Pain:  Vitals:   10/12/18 1646  TempSrc: Oral  PainSc:                  [place]

## 2018-10-12 NOTE — Consult Note (Signed)
H&P Physician requesting consult: Jennette Kettle, DO  Chief Complaint: Metastatic prostate cancer, bilateral hydronephrosis  History of Present Illness: 58 year old male with a history of urinary retention and bilateral hydronephrosis.  Obtain a PSA on the patient that revealed a PSA of 410.  He subsequent underwent a prostate biopsy that revealed prostate cancer in all 12 cores.  Maximum Gleason score was 9.  Patient does not have insurance and I have been trying to get him a metastatic work-up followed by scheduling for either androgen deprivation therapy or bilateral orchiectomy.  However, the patient presented to the emergency department last night.  He had hyperkalemia and a creatinine of 2.  Creatinine is essentially unchanged from prior values.  He underwent a CT scan which revealed bilateral hydronephrosis and a thick-walled bladder.  There was also metastatic lymphadenopathy and sclerotic lesions in the bone consistent with bone metastasis.  Patient mainly came in with complaints of increasing urinary complaints.  Urinalysis showed trace leukocytes negative nitrite, rare bacteria, greater than 50 WBCs.  He does have history urinary retention.  I had originally planned for a channel TURP to try to get him out of retention and hopefully help the hydronephrosis.  He had previously refused a Foley catheter.  Past Medical History:  Diagnosis Date  . Diabetes mellitus, new onset ([place]) 07/2016  . Enlarged prostate   . HLD (hyperlipidemia)    Past Surgical History:  Procedure Laterality Date  . KNEE ARTHROSCOPY Right     Home Medications:  Medications Prior to Admission  Medication Sig Dispense Refill Last Dose  . Blood Glucose Monitoring Suppl (RELION CONFIRM GLUCOSE MONITOR) w/Device KIT Use 4 times daily before meals and bedtime as directed. 1 kit 0   . finasteride (PROSCAR) 5 MG tablet Take 5 mg by mouth daily.   10/11/2018 at Unknown time  . glucose blood (RELION GLUCOSE TEST STRIPS) test  strip Use as instructed 100 each 12   . Lancets 30G MISC Use 4 times daily before meals as directed. 100 each 0   . lisinopril (PRINIVIL,ZESTRIL) 20 MG tablet Take 10 tablets by mouth daily.    10/11/2018 at Unknown time  . tamsulosin (FLOMAX) 0.4 MG CAPS capsule Take 1 capsule (0.4 mg total) by mouth daily after supper. (Patient taking differently: Take 0.8 mg by mouth daily after supper. ) 30 capsule 0 10/11/2018 at Unknown time  . metFORMIN (GLUCOPHAGE-XR) 500 MG 24 hr tablet Take 1 tablet ('500mg'$ ) daily for the next 4 days, then begin taking 1 tablet ('500mg'$ ) two times every day (for a total of '1000mg'$  daily). (Patient not taking: Reported on 10/12/2018) 60 tablet 0 Not Taking at Unknown time   Allergies: No Known Allergies  Family History  Problem Relation Age of Onset  . Prostate cancer Father   . Heart disease Father    Social History:  reports that he quit smoking about 5 years ago. His smoking use included cigarettes. He has never used smokeless tobacco. He reports that he does not drink alcohol or use drugs.  ROS: A complete review of systems was performed.  All systems are negative except for pertinent findings as noted. ROS   Physical Exam:  Vital signs in last 24 hours: Temp:  [97.9 F (36.6 C)] 97.9 F (36.6 C) (06/28 2220) Pulse Rate:  [118-146] 129 (06/29 0406) Resp:  [15-21] 15 (06/29 0332) BP: (110-141)/(74-100) 141/100 (06/29 0406) SpO2:  [95 %-100 %] 100 % (06/29 0406) Weight:  [99.9 kg] 99.9 kg (06/29 0865) General:  Alert and oriented, No acute distress HEENT: Normocephalic, atraumatic Neck: No JVD or lymphadenopathy Cardiovascular: Regular rate and rhythm Lungs: Regular rate and effort Abdomen: Soft, nontender, nondistended, no abdominal masses Back: No CVA tenderness Extremities: No edema Neurologic: Grossly intact  Laboratory Data:  Results for orders placed or performed during the hospital encounter of 10/11/18 (from the past 24 hour(s))  Basic metabolic  panel     Status: Abnormal   Collection Time: 10/11/18 10:50 PM  Result Value Ref Range   Sodium 141 135 - 145 mmol/L   Potassium 5.9 (H) 3.5 - 5.1 mmol/L   Chloride 115 (H) 98 - 111 mmol/L   CO2 16 (L) 22 - 32 mmol/L   Glucose, Bld 134 (H) 70 - 99 mg/dL   BUN 47 (H) 6 - 20 mg/dL   Creatinine, Ser 2.06 (H) 0.61 - 1.24 mg/dL   Calcium 9.1 8.9 - 10.3 mg/dL   GFR calc non Af Amer 35 (L) >60 mL/min   GFR calc Af Amer 41 (L) >60 mL/min   Anion gap 10 5 - 15  CBC with Differential     Status: Abnormal   Collection Time: 10/11/18 10:50 PM  Result Value Ref Range   WBC 11.3 (H) 4.0 - 10.5 K/uL   RBC 4.42 4.22 - 5.81 MIL/uL   Hemoglobin 11.2 (L) 13.0 - 17.0 g/dL   HCT 38.8 (L) 39.0 - 52.0 %   MCV 87.8 80.0 - 100.0 fL   MCH 25.3 (L) 26.0 - 34.0 pg   MCHC 28.9 (L) 30.0 - 36.0 g/dL   RDW 16.1 (H) 11.5 - 15.5 %   Platelets 416 (H) 150 - 400 K/uL   nRBC 0.0 0.0 - 0.2 %   Neutrophils Relative % 67 %   Neutro Abs 7.4 1.7 - 7.7 K/uL   Lymphocytes Relative 21 %   Lymphs Abs 2.4 0.7 - 4.0 K/uL   Monocytes Relative 8 %   Monocytes Absolute 1.0 0.1 - 1.0 K/uL   Eosinophils Relative 2 %   Eosinophils Absolute 0.3 0.0 - 0.5 K/uL   Basophils Relative 0 %   Basophils Absolute 0.1 0.0 - 0.1 K/uL   Immature Granulocytes 2 %   Abs Immature Granulocytes 0.26 (H) 0.00 - 0.07 K/uL  Urinalysis, Routine w reflex microscopic- may I&O cath if menses     Status: Abnormal   Collection Time: 10/11/18 10:55 PM  Result Value Ref Range   Color, Urine YELLOW YELLOW   APPearance CLOUDY (A) CLEAR   Specific Gravity, Urine 1.011 1.005 - 1.030   pH 5.0 5.0 - 8.0   Glucose, UA NEGATIVE NEGATIVE mg/dL   Hgb urine dipstick MODERATE (A) NEGATIVE   Bilirubin Urine NEGATIVE NEGATIVE   Ketones, ur NEGATIVE NEGATIVE mg/dL   Protein, ur 100 (A) NEGATIVE mg/dL   Nitrite NEGATIVE NEGATIVE   Leukocytes,Ua TRACE (A) NEGATIVE   RBC / HPF >50 (H) 0 - 5 RBC/hpf   WBC, UA >50 (H) 0 - 5 WBC/hpf   Bacteria, UA RARE (A) NONE  SEEN   Squamous Epithelial / LPF 0-5 0 - 5  Lactic acid, plasma     Status: None   Collection Time: 10/11/18 11:23 PM  Result Value Ref Range   Lactic Acid, Venous 0.8 0.5 - 1.9 mmol/L  Blood Culture (routine x 2)     Status: None (Preliminary result)   Collection Time: 10/11/18 11:23 PM   Specimen: BLOOD LEFT FOREARM  Result Value Ref Range   Specimen Description  BLOOD LEFT FOREARM Performed at [facility], [place] [address] [place], [place] [address]    Special Requests      BOTTLES DRAWN AEROBIC AND ANAEROBIC Blood Culture results may not be optimal due to an excessive volume of blood received in culture bottles Performed at [facility] [address], [place], [place] [address]    Culture PENDING    Report Status PENDING   Blood Culture (routine x 2)     Status: None (Preliminary result)   Collection Time: 10/12/18 12:41 AM   Specimen: BLOOD LEFT HAND  Result Value Ref Range   Specimen Description      BLOOD LEFT HAND Performed at [facility], [place] [address], [place], [place] [address]    Special Requests      BOTTLES DRAWN AEROBIC AND ANAEROBIC Blood Culture adequate volume Performed at [facility] [address] [place], [place] [address]    Culture PENDING    Report Status PENDING   Glucose, capillary     Status: Abnormal   Collection Time: 10/12/18  4:39 AM  Result Value Ref Range   Glucose-Capillary 118 (H) 70 - 99 mg/dL  Lactic acid, plasma     Status: None   Collection Time: 10/12/18  4:48 AM  Result Value Ref Range   Lactic Acid, Venous 0.8 0.5 - 1.9 mmol/L  Basic metabolic panel     Status: Abnormal   Collection Time: 10/12/18  4:48 AM  Result Value Ref Range   Sodium 143 135 - 145 mmol/L   Potassium 5.7 (H) 3.5 - 5.1 mmol/L   Chloride 115 (H) 98 - 111 mmol/L   CO2 20 (L) 22 - 32 mmol/L   Glucose, Bld 122 (H) 70 - 99 mg/dL   BUN 46 (H) 6 - 20 mg/dL   Creatinine, Ser 2.03 (H) 0.61 - 1.24 mg/dL    Calcium 9.1 8.9 - 10.3 mg/dL   GFR calc non Af Amer 36 (L) >60 mL/min   GFR calc Af Amer 42 (L) >60 mL/min   Anion gap 8 5 - 15  Na and K (sodium & potassium), rand urine     Status: None   Collection Time: 10/12/18  6:01 AM  Result Value Ref Range   Sodium, Ur 44 mmol/L   Potassium Urine 16 mmol/L  Creatinine, urine, random     Status: None   Collection Time: 10/12/18  6:01 AM  Result Value Ref Range   Creatinine, Urine 38.31 mg/dL   Recent Results (from the past 240 hour(s))  Blood Culture (routine x 2)     Status: None (Preliminary result)   Collection Time: 10/11/18 11:23 PM   Specimen: BLOOD LEFT FOREARM  Result Value Ref Range Status   Specimen Description   Final    BLOOD LEFT FOREARM Performed at [facility], [address], [place], [place] [address]    Special Requests   Final    BOTTLES DRAWN AEROBIC AND ANAEROBIC Blood Culture results may not be optimal due to an excessive volume of blood received in culture bottles Performed at [facility] [address] [place], [place] [address]    Culture PENDING  Incomplete   Report Status PENDING  Incomplete  Blood Culture (routine x 2)     Status: None (Preliminary result)   Collection Time: 10/12/18 12:41 AM   Specimen: BLOOD LEFT HAND  Result Value Ref Range Status   Specimen Description  Final    BLOOD LEFT HAND Performed at [facility], [place] [address], [place], [place] [address]    Special Requests   Final    BOTTLES DRAWN AEROBIC AND ANAEROBIC Blood Culture adequate volume Performed at [facility] [address], [place], [place] [address]    Culture PENDING  Incomplete   Report Status PENDING  Incomplete   Creatinine: Recent Labs    10/11/18 2250 10/12/18 0448  CREATININE 2.06* 2.03*   CT scan personally reviewed and is detailed in the history of present illness  Impression/Assessment:  Metastatic prostate cancer Metastatic lymphadenopathy  secondary to the above 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  I discussed androgen deprivation therapy versus bilateral orchiectomy.  The patient has elected for bilateral orchiectomy.  I will plan to post him for surgery today for bilateral orchiectomy, cystoscopy with possible bilateral ureteral stent placement, possible channel transurethral resection of prostate.  He understands the potential for bleeding, infection, injury to surrounding structures, need for additional procedures, possibility of not being able to access the ureteral orifices.  If I am unable to access the ureteral orifices, he may need bilateral nephrostomy tubes.  Please obtain social work consultation and see if he can be assisted in obtaining insurance.  This is very important given his new diagnosis and his need for ongoing care of his metastatic prostate cancer.  Marton Redwood, III 10/12/2018, 7:47 AM

## 2018-10-13 ENCOUNTER — Encounter (HOSPITAL_COMMUNITY): Payer: Self-pay | Admitting: Urology

## 2018-10-13 DIAGNOSIS — N179 Acute kidney failure, unspecified: Secondary | ICD-10-CM

## 2018-10-13 DIAGNOSIS — N1339 Other hydronephrosis: Secondary | ICD-10-CM

## 2018-10-13 DIAGNOSIS — N183 Chronic kidney disease, stage 3 unspecified: Secondary | ICD-10-CM | POA: Diagnosis present

## 2018-10-13 DIAGNOSIS — N3001 Acute cystitis with hematuria: Secondary | ICD-10-CM

## 2018-10-13 DIAGNOSIS — E875 Hyperkalemia: Secondary | ICD-10-CM

## 2018-10-13 DIAGNOSIS — C61 Malignant neoplasm of prostate: Principal | ICD-10-CM

## 2018-10-13 DIAGNOSIS — E538 Deficiency of other specified B group vitamins: Secondary | ICD-10-CM | POA: Diagnosis present

## 2018-10-13 LAB — GLUCOSE, CAPILLARY
Glucose-Capillary: 100 mg/dL — ABNORMAL HIGH (ref 70–99)
Glucose-Capillary: 102 mg/dL — ABNORMAL HIGH (ref 70–99)
Glucose-Capillary: 103 mg/dL — ABNORMAL HIGH (ref 70–99)
Glucose-Capillary: 184 mg/dL — ABNORMAL HIGH (ref 70–99)
Glucose-Capillary: 80 mg/dL (ref 70–99)
Glucose-Capillary: 86 mg/dL (ref 70–99)

## 2018-10-13 LAB — MAGNESIUM: Magnesium: 2.2 mg/dL (ref 1.7–2.4)

## 2018-10-13 LAB — POTASSIUM: Potassium: 4.7 mmol/L (ref 3.5–5.1)

## 2018-10-13 LAB — RENAL FUNCTION PANEL
Albumin: 2.5 g/dL — ABNORMAL LOW (ref 3.5–5.0)
Anion gap: 9 (ref 5–15)
BUN: 37 mg/dL — ABNORMAL HIGH (ref 6–20)
CO2: 21 mmol/L — ABNORMAL LOW (ref 22–32)
Calcium: 8.1 mg/dL — ABNORMAL LOW (ref 8.9–10.3)
Chloride: 115 mmol/L — ABNORMAL HIGH (ref 98–111)
Creatinine, Ser: 1.96 mg/dL — ABNORMAL HIGH (ref 0.61–1.24)
GFR calc Af Amer: 43 mL/min — ABNORMAL LOW (ref >60)
GFR calc non Af Amer: 37 mL/min — ABNORMAL LOW (ref >60)
Glucose, Bld: 101 mg/dL — ABNORMAL HIGH (ref 70–99)
Phosphorus: 4.4 mg/dL (ref 2.5–4.6)
Potassium: 5.5 mmol/L — ABNORMAL HIGH (ref 3.5–5.1)
Sodium: 145 mmol/L (ref 135–145)

## 2018-10-13 LAB — IRON AND TIBC
Iron: 46 ug/dL (ref 45–182)
Saturation Ratios: 26 % (ref 17.9–39.5)
TIBC: 179 ug/dL — ABNORMAL LOW (ref 250–450)
UIBC: 133 ug/dL

## 2018-10-13 LAB — FOLATE: Folate: 10.5 ng/mL (ref >5.9)

## 2018-10-13 LAB — VITAMIN B12: Vitamin B-12: 166 pg/mL — ABNORMAL LOW (ref 180–914)

## 2018-10-13 LAB — CBC
HCT: 32.5 % — ABNORMAL LOW (ref 39.0–52.0)
Hemoglobin: 9.4 g/dL — ABNORMAL LOW (ref 13.0–17.0)
MCH: 25.6 pg — ABNORMAL LOW (ref 26.0–34.0)
MCHC: 28.9 g/dL — ABNORMAL LOW (ref 30.0–36.0)
MCV: 88.6 fL (ref 80.0–100.0)
Platelets: 310 10*3/uL (ref 150–400)
RBC: 3.67 MIL/uL — ABNORMAL LOW (ref 4.22–5.81)
RDW: 16.1 % — ABNORMAL HIGH (ref 11.5–15.5)
WBC: 9.8 10*3/uL (ref 4.0–10.5)
nRBC: 0 % (ref 0.0–0.2)

## 2018-10-13 LAB — FERRITIN: Ferritin: 631 ng/mL — ABNORMAL HIGH (ref 24–336)

## 2018-10-13 LAB — NOVEL CORONAVIRUS, NAA (HOSP ORDER, SEND-OUT TO REF LAB; TAT 18-24 HRS): SARS-CoV-2, NAA: NOT DETECTED

## 2018-10-13 MED ORDER — ADULT MULTIVITAMIN W/MINERALS CH
1.0000 | ORAL_TABLET | Freq: Every day | ORAL | Status: DC
  Administered 2018-10-13 – 2018-10-14 (×2): 1 via ORAL
  Filled 2018-10-13 (×2): qty 1

## 2018-10-13 MED ORDER — PRO-STAT SUGAR FREE PO LIQD: 30.0000 mL | Freq: Two times a day (BID) | ORAL | Status: DC

## 2018-10-13 MED ORDER — PRO-STAT SUGAR FREE PO LIQD
30.0000 mL | Freq: Three times a day (TID) | ORAL | Status: DC
  Administered 2018-10-13 – 2018-10-14 (×3): 30 mL via ORAL
  Filled 2018-10-13 (×3): qty 30

## 2018-10-13 MED ORDER — SODIUM ZIRCONIUM CYCLOSILICATE 10 G PO PACK
10.0000 g | PACK | Freq: Two times a day (BID) | ORAL | Status: DC
  Filled 2018-10-13: qty 1

## 2018-10-13 MED ORDER — SODIUM ZIRCONIUM CYCLOSILICATE 10 G PO PACK
10.0000 g | PACK | Freq: Three times a day (TID) | ORAL | Status: DC
  Administered 2018-10-13 – 2018-10-14 (×2): 10 g via ORAL
  Filled 2018-10-13 (×3): qty 1

## 2018-10-13 MED ORDER — ENSURE ENLIVE PO LIQD: 237.0000 mL | Freq: Two times a day (BID) | ORAL | Status: DC

## 2018-10-13 MED ORDER — ENSURE ENLIVE PO LIQD: 237.0000 mL | ORAL | Status: DC

## 2018-10-13 MED ORDER — CYANOCOBALAMIN 1000 MCG/ML IJ SOLN
1000.0000 ug | Freq: Every day | INTRAMUSCULAR | Status: DC
  Administered 2018-10-13 – 2018-10-14 (×2): 1000 ug via SUBCUTANEOUS
  Filled 2018-10-13 (×2): qty 1

## 2018-10-13 MED ORDER — SODIUM CHLORIDE 0.45 % IV SOLN
INTRAVENOUS | Status: DC
  Administered 2018-10-13: 09:00:00 via INTRAVENOUS

## 2018-10-13 MED ORDER — INSULIN ASPART 100 UNIT/ML ~~LOC~~ SOLN: 0.0000 [IU] | Freq: Three times a day (TID) | SUBCUTANEOUS | Status: DC

## 2018-10-13 NOTE — Plan of Care (Signed)

## 2018-10-13 NOTE — Consult Note (Signed)
Referring Provider: No ref. provider found Primary Care Physician:  [name], MD Primary Nephrologist:    Reason for Consultation: Acute on chronic renal insufficiency, maintenance of euvolemia, correction of electrolyte abnormalities, management of acid-base abnormalities, assessment of anemia.  HPI: This is a 58 year old gentleman that has a history of metastatic prostate cancer with bilateral hydronephrosis.  He developed urinary retention with bilateral hydronephrosis.  His PSA measures 410.  He had a prostate biopsy that revealed a Gleason score of 9.  He had a Foley cath in place and bilateral orchiectomy performed on 10/12/2018.  He has a history of diabetes mellitus since 2018.  He also has a history of hyperlipidemia.  He does have a history of chronic renal insufficiency and his creatinine appears to be about 1.4 at baseline this was measured in 07/2016.  He has good urine output with about 4-1/2 L since 10/12/2018.  He has an indwelling urinary Foley catheter  Blood pressure 106/74 pulse 83 O2 sats 98%  IV fluids sodium bicarbonate 100 cc an hour IV half normal saline 100 cc an hour  IV cefepime 2 g every 12 hours  Sodium 145 potassium 5.5 chloride 115 CO2 21 BUN 37 creatinine 1.96 glucose 101 calcium 8.1 phosphorus 4.4 magnesium 2.2 albumin 2.5 WBC 9.8 hemoglobin 9.4 platelets 310  Home medications lisinopril 20 mg daily, metformin 500 mg daily, Proscar 5 mg daily and Flomax 0.4 mg daily  Proscar 5 mg daily insulin sliding scale, Pyridium 200 mg 3 times daily, Lokelma 10 g twice daily, Flomax 0.4 mg daily,  Past Medical History:  Diagnosis Date  . Diabetes mellitus, new onset ([place]) 07/2016  . Enlarged prostate   . HLD (hyperlipidemia)     Past Surgical History:  Procedure Laterality Date  . KNEE ARTHROSCOPY Right   . ORCHIECTOMY Bilateral 10/12/2018   Procedure: ORCHIECTOMY;  Surgeon: Lucas Mallow, MD;  Location: [facility];  Service: Urology;  Laterality:  Bilateral;  . TRANSURETHRAL RESECTION OF PROSTATE  10/12/2018   Procedure: CYSTOSCOPY WITH URETHRAL DILATION;  Surgeon: Lucas Mallow, MD;  Location: [facility];  Service: Urology;;    Prior to Admission medications   Medication Sig Start Date End Date Taking? Authorizing Provider  Blood Glucose Monitoring Suppl (RELION CONFIRM GLUCOSE MONITOR) w/Device KIT Use 4 times daily before meals and bedtime as directed. 07/31/16  Yes Holley Raring, MD  finasteride (PROSCAR) 5 MG tablet Take 5 mg by mouth daily. 02/12/18  Yes [provider]  glucose blood (RELION GLUCOSE TEST STRIPS) test strip Use as instructed 07/31/16  Yes Holley Raring, MD  Lancets 30G MISC Use 4 times daily before meals as directed. 07/31/16  Yes Holley Raring, MD  lisinopril (PRINIVIL,ZESTRIL) 20 MG tablet Take 10 tablets by mouth daily.  04/27/18  Yes [provider]  tamsulosin (FLOMAX) 0.4 MG CAPS capsule Take 1 capsule (0.4 mg total) by mouth daily after supper. Patient taking differently: Take 0.8 mg by mouth daily after supper.  07/31/16  Yes Holley Raring, MD  metFORMIN (GLUCOPHAGE-XR) 500 MG 24 hr tablet Take 1 tablet (552m) daily for the next 4 days, then begin taking 1 tablet (5067m two times every day (for a total of 100078maily). Patient not taking: Reported on 10/12/2018 07/31/16   StrHolley RaringD    Current Facility-Administered Medications  Medication Dose Route Frequency Provider Last Rate Last Dose  . 0.45 % sodium chloride infusion   Intravenous Continuous ThoEugenie FillerD 100 mL/hr at 10/13/18  9373    . acetaminophen (TYLENOL) tablet 650 mg  650 mg Oral Q6H PRN Etta Quill, DO   650 mg at 10/13/18 0149   Or  . acetaminophen (TYLENOL) suppository 650 mg  650 mg Rectal Q6H PRN Etta Quill, DO      . ceFEPIme (MAXIPIME) 2 g in sodium chloride 0.9 % 100 mL IVPB  2 g Intravenous Q12H Leodis Sias T, RPH 200 mL/hr at 10/13/18 0419 2 g at 10/13/18 0419  . finasteride (PROSCAR)  tablet 5 mg  5 mg Oral Daily Jennette Kettle M, DO   5 mg at 10/12/18 1911  . HYDROcodone-acetaminophen (NORCO/VICODIN) 5-325 MG per tablet 1-2 tablet  1-2 tablet Oral Q4H PRN Etta Quill, DO   1 tablet at 10/13/18 1131  . insulin aspart (novoLOG) injection 0-9 Units  0-9 Units Subcutaneous Q4H Etta Quill, DO   2 Units at 10/13/18 0044  . ondansetron (ZOFRAN) tablet 4 mg  4 mg Oral Q6H PRN Etta Quill, DO       Or  . ondansetron [facility]) injection 4 mg  4 mg Intravenous Q6H PRN Etta Quill, DO      . phenazopyridine (PYRIDIUM) tablet 200 mg  200 mg Oral TID WC Kathrynn Humble, Ankit, MD   200 mg at 10/13/18 0851  . sodium bicarbonate 150 mEq in sterile water 1,000 mL infusion   Intravenous Continuous Etta Quill, DO 100 mL/hr at 10/12/18 0326    . sodium zirconium cyclosilicate (LOKELMA) packet 10 g  10 g Oral BID Eugenie Filler, MD      . tamsulosin [facility]) capsule 0.4 mg  0.4 mg Oral QPC supper Etta Quill, DO   0.4 mg at 10/12/18 1911    Allergies as of 10/11/2018  . (No Known Allergies)    Family History  Problem Relation Age of Onset  . Prostate cancer Father   . Heart disease Father     Social History   Socioeconomic History  . Marital status: Divorced    Spouse name: Not on file  . Number of children: 1  . Years of education: Not on file  . Highest education level: Not on file  Occupational History  . Occupation: Teaching laboratory technician  Social Needs  . Financial resource strain: Not on file  . Food insecurity    Worry: Not on file    Inability: Not on file  . Transportation needs    Medical: Not on file    Non-medical: Not on file  Tobacco Use  . Smoking status: Former Smoker    Types: Cigarettes    Quit date: 2015    Years since quitting: 5.4  . Smokeless tobacco: Never Used  . Tobacco comment: 2015  Substance and Sexual Activity  . Alcohol use: No  . Drug use: No  . Sexual activity: Not on file  Lifestyle  . Physical  activity    Days per week: Not on file    Minutes per session: Not on file  . Stress: Not on file  Relationships  . Social Herbalist on phone: Not on file    Gets together: Not on file    Attends religious service: Not on file    Active member of club or organization: Not on file    Attends meetings of clubs or organizations: Not on file    Relationship status: Not on file  . Intimate partner violence    Fear of current  or ex partner: Not on file    Emotionally abused: Not on file    Physically abused: Not on file    Forced sexual activity: Not on file  Other Topics Concern  . Not on file  Social History Narrative   The patient is separated he has 1 son, he is a Academic librarian.   Quit smoking 2015, no alcohol no caffeine no tobacco or drug use   No health insurance    Review of Systems: Gen: Denies any fever, chills, sweats, anorexia, fatigue, weakness, malaise, weight loss, and sleep disorder HEENT: No visual complaints, No history of Retinopathy. Normal external appearance No Epistaxis or Sore throat. No sinusitis.   CV: Denies chest pain, angina, palpitations, syncope, orthopnea, PND, peripheral edema, and claudication. Resp: Denies dyspnea at rest, dyspnea with exercise, cough, sputum, wheezing, coughing up blood, and pleurisy. GI: Denies vomiting blood, jaundice, and fecal incontinence.   Denies dysphagia or odynophagia. GU : Denies urinary burning, blood in urine, urinary frequency, urinary hesitancy, nocturnal urination, and urinary incontinence.  No renal calculi. MS: Denies joint pain, limitation of movement, and swelling, stiffness, low back pain, extremity pain. Denies muscle weakness, cramps, atrophy.  No use of non steroidal antiinflammatory drugs. Derm: Denies rash, itching, dry skin, hives, moles, warts, or unhealing ulcers.  Psych: Denies depression, anxiety, memory loss, suicidal ideation, hallucinations, paranoia, and  confusion. Heme: Denies bruising, bleeding, and enlarged lymph nodes. Neuro: No headache.  No diplopia. No dysarthria.  No dysphasia.  No history of CVA.  No Seizures. No paresthesias.  No weakness. Endocrine No DM.  No Thyroid disease.  No Adrenal disease.  Physical Exam: Vital signs in last 24 hours: Temp:  [97.6 F (36.4 C)-98.1 F (36.7 C)] 97.6 F (36.4 C) (06/30 0031) Pulse Rate:  [95-109] 101 (06/30 0031) Resp:  [15-26] 16 (06/30 0031) BP: (114-150)/(69-111) 122/78 (06/30 0031) SpO2:  [91 %-100 %] 100 % (06/30 0031) Last BM Date: 10/12/18   General:   Alert,  Well-developed, well-nourished, pleasant and cooperative in NAD Head:  Normocephalic and atraumatic. Eyes:  Sclera clear, no icterus.   Conjunctiva pink. Ears:  Normal auditory acuity. Nose:  No deformity, discharge,  or lesions. Mouth:  No deformity or lesions, dentition normal. Neck:  Supple; no masses or thyromegaly. JVP not elevated Lungs:  Clear throughout to auscultation.   No wheezes, crackles, or rhonchi. No acute distress. Heart:  Regular rate and rhythm; no murmurs, clicks, rubs,  or gallops. Abdomen:  Soft, nontender and nondistended. No masses, hepatosplenomegaly or hernias noted. Normal bowel sounds, without guarding, and without rebound.   Msk:  Symmetrical without gross deformities. Normal posture. Pulses:  No carotid, renal, femoral bruits. DP and PT symmetrical and equal Extremities:  Without clubbing or edema. Neurologic:  Alert and  oriented x4;  grossly normal neurologically. Skin:  Intact without significant lesions or rashes. Cervical Nodes:  No significant cervical adenopathy. Psych:  Alert and cooperative. Normal mood and affect.  Intake/Output from previous day: 06/29 0701 - 06/30 0700 In: 2962.6 [P.O.:240; I.V.:2622.6; IV Piggyback:100] Out: 3050 [Urine:3000; Blood:50] Intake/Output this shift: Total I/O In: -  Out: 700 [Urine:700]  Lab Results: Recent Labs    10/11/18 2250  10/13/18 0437  WBC 11.3* 9.8  HGB 11.2* 9.4*  HCT 38.8* 32.5*  PLT 416* 310   BMET Recent Labs    10/11/18 2250 10/12/18 0448 10/12/18 0922 10/12/18 1238 10/13/18 0437  NA 141 143  --   --  145  K 5.9* 5.7* 5.7* 5.6* 5.5*  CL 115* 115*  --   --  115*  CO2 16* 20*  --   --  21*  GLUCOSE 134* 122*  --   --  101*  BUN 47* 46*  --   --  37*  CREATININE 2.06* 2.03*  --   --  1.96*  CALCIUM 9.1 9.1  --   --  8.1*  PHOS  --   --   --   --  4.4   LFT Recent Labs    10/13/18 0437  ALBUMIN 2.5*   PT/INR No results for input(s): LABPROT, INR in the last 72 hours. Hepatitis Panel No results for input(s): HEPBSAG, HCVAB, HEPAIGM, HEPBIGM in the last 72 hours.  Studies/Results: Dg C-arm 1-60 Min-no Report  Result Date: 10/12/2018 Fluoroscopy was utilized by the requesting physician.  No radiographic interpretation.   Ct Renal Stone Study  Result Date: 10/12/2018 CLINICAL DATA:  Urinary retention EXAM: CT ABDOMEN AND PELVIS WITHOUT CONTRAST TECHNIQUE: Multidetector CT imaging of the abdomen and pelvis was performed following the standard protocol without IV contrast. COMPARISON:  July 29, 2016 FINDINGS: Lower chest: There is a partially visualized ground-glass airspace opacity in the right lower lobe measuring approximately 1.4 cm (axial series 4, image 1). There is atelectasis at the lung bases, right worse than left.)The heart size is normal. Hepatobiliary: The liver is normal. Cholelithiasis without acute inflammation.There is no biliary ductal dilation. Pancreas: Normal contours without ductal dilatation. No peripancreatic fluid collection. Spleen: No splenic laceration or hematoma. Adrenals/Urinary Tract: --Adrenal glands: No adrenal hemorrhage. --Right kidney/ureter: There is moderate to severe right-sided hydronephrosis without evidence of an obstructing stone. There is an abrupt change in caliber of the distal right ureter proximal to the right UVJ. --Left kidney/ureter: There is  moderate severe left-sided hydronephrosis without evidence of an obstructing stone. --Urinary bladder: There is diffuse asymmetric urinary bladder wall thickening. There is adjacent fat stranding and free fluid. Stomach/Bowel: --Stomach/Duodenum: No hiatal hernia or other gastric abnormality. Normal duodenal course and caliber. --Small bowel: No dilatation or inflammation. --Colon: Rectosigmoid diverticulosis without acute inflammation. --Appendix: Normal. Vascular/Lymphatic: Normal course and caliber of the major abdominal vessels. --there are multiple pathologically enlarged retroperitoneal lymph nodes. --No mesenteric lymphadenopathy. --there are pathologically enlarged pelvic and inguinal lymph nodes. For example there is a 1.6 cm enlarged left pelvic sidewall lymph node. Reproductive: The prostate gland is mildly enlarged measuring approximately 4.2 by 3.8 cm. Other: There is a trace amount of free fluid in the pelvis. The abdominal wall is normal. Musculoskeletal. There are extensive sclerotic osseous lesions throughout essentially all of the visualized osseous structures. There is no pathologic fracture identified at this time. Large sclerotic lesions are noted involving both proximal femurs. IMPRESSION: 1. Overall findings highly suspicious for metastatic bladder cancer. There is moderate bilateral hydronephrosis. On the left, the level of obstruction is at the urinary bladder. On the right, the level of obstruction is proximal to the urinary bladder in the distal right ureter, raising suspicion for an ascending transitional cell carcinoma. There is diffuse asymmetric bladder wall thickening, greatest along the right lateral bladder wall. In addition, there are pathologically enlarged pelvic, inguinal, and retroperitoneal lymph nodes. 2. Diffuse osseous sclerotic lesions involving virtually all of the visualized osseous structures is highly concerning for osseous metastatic disease. This is new since CT in  2018. 3. Partially visualized ground-glass airspace opacity in the right lower lobe as detailed above. This could represent a metastatic focus within  the thorax. Consider a nonemergent follow-up CT of the chest for further evaluation. 4. Cholelithiasis without CT evidence for acute cholecystitis. 5. The prostate gland is only mildly enlarged. Electronically Signed   By: Constance Holster M.D.   On: 10/12/2018 03:28    Assessment/Plan:  Acute on chronic kidney disease baseline serum creatinine appears to be about 1.4.  Urinalysis greater than 50 RBCs greater than 50 WBCs 0-5 squamous epithelial cells 100 mg/dL protein.  CT scan of the abdomen is highly suspicious for metastatic bladder cancer with moderate bilateral hydronephrosis diffuse asymmetric bladder wall thickening right lateral bladder wall and large pelvic inguinal retroperitoneal nodes diffuse osseous sclerotic lesions partially visualized groundglass airspace opacity in the right lower lobe cholelithiasis without evidence of cholecystitis and a mildly enlarged prostate gland.  He has a Foley catheter in place.  Continue to avoid nephrotoxic agents ACE inhibitor's ARB's metformin nonsteroidal anti-inflammatories Cox 2 inhibitors IV contrast and intra-arterial procedures.  Hypertension/volume appears to be adequately controlled blood pressure little on the low side will replete with IV fluids patient has postobstructive picture with postobstructive diuresis.  Hyperkalemia patient placed on IV bicarbonate at 100 cc an hour I will increase this to 150 cc an hour.  Agree with Lokelma increased to 10 mg 3 times daily.  Patient received calcium chloride 10/12/2018  Anemia appears to be stable will avoid the use of ESA's in the setting of metastatic cancer  Metastatic prostate cancer status post cystoscopy appreciate assistance of Dr. Gloriann Loan status post orchiectomy 10/12/2018  Diabetes mellitus agree with discontinuation of metformin.   LOS:  [place] _0 _1 :54 AM

## 2018-10-13 NOTE — Progress Notes (Addendum)
Initial Nutrition Assessment  DOCUMENTATION CODES:   Obesity unspecified  INTERVENTION:  - will order Ensure Enlive once/day, each supplement provides 350 kcal and 20 grams of protein. - will order 30 mL Prostat TID, each supplement provides 100 kcal and 15 grams of protein. - will order daily multivitamin with minerals.    NUTRITION DIAGNOSIS:   Increased nutrient needs related to acute illness as evidenced by estimated needs.  GOAL:   Patient will meet greater than or equal to 90% of their needs  MONITOR:   PO intake, Supplement acceptance, Labs, Weight trends  REASON FOR ASSESSMENT:   Malnutrition Screening Tool  ASSESSMENT:   58 year-old male with medical history significant of type 2 DM, enlarged prostate, s/p prostate biopsy 2 weeks PTA. He presented to the ED with c/o urinary retention; increased frequency of urgency but rarely has output. His Urologist suggested self-cath which he has been doing with minimal output. He denies abdominal discomfort associated with this. Patient with dysuria and found to have hematuria.  Diet advanced from NPO to Regular yesterday at 3:40 PM and then changed back to NPO at midnight. Diet then advanced to Renal/Carb Modified with 1.2L fluid restriction ~30 minutes ago. Patient has not had a full meal since admission, did take some bites and sips yesterday. He reports that for a few weeks PTA he was experiencing early satiety, decreased appetite and decreased desire to eat. He denies any abdominal pain/pressure or N/V since admission or PTA.   Per chart review, current weight is 220 lb and weight on 06/22/18 was 241 lb. This indicates 21 lb weight loss (8.7% body weight) in the past 3.5 months; significant for time frame. Will monitor weight trends closely during hospitalization, given oral fluid restriction and IV fluids.  Per notes: -metastatic prostate cancer s/p cystoscopy 6/29 -bilateral hydronephrosis -lokelma and bicarb IV fluids d/t  hyperkalemia -plan for outpatient nephrology follow-up    Medications reviewed; 1000 mcg subcutaneous vitamin B12 x7 days starting 6/30, sliding scale novolog, 10 g lokelma TID.   Labs reviewed; CBGs: 100, 80, 86 mg/dl, K: 5.5 mmol/l, Cl: 115 mmol/l, BUN: 37 mg/dl, creatinine: 1.96 mg/dl, Ca: 8.1 mg/dl, GFR: 37 ml/min.  IVF; 150 mEq sodium bicarb in sterile water @ 150 ml/hr.     NUTRITION - FOCUSED PHYSICAL EXAM:  unable to complete at this time.  Diet Order:   Diet Order            Diet renal/carb modified with fluid restriction Diet-HS Snack? Nothing; Fluid restriction: 1200 mL Fluid; Room service appropriate? Yes; Fluid consistency: Thin  Diet effective now              EDUCATION NEEDS:   No education needs have been identified at this time  Skin:  Skin Assessment: Reviewed RN Assessment  Last BM:  6/29  Height:   Ht Readings from Last 1 Encounters:  10/12/18 5\' 9"  (1.753 m)    Weight:   Wt Readings from Last 1 Encounters:  10/12/18 99.9 kg    Ideal Body Weight:  72.7 kg  BMI:  Body mass index is 32.52 kg/m.  Estimated Nutritional Needs:   Kcal:  2300-2500 kcal  Protein:  120-130 grams  Fluid:  >/= 2 L/day      Jarome Matin, MS, RD, LDN, [facility] Inpatient Clinical Dietitian Pager # [phone] After hours/weekend pager # [phone]

## 2018-10-13 NOTE — Consult Note (Addendum)
H&P Physician requesting consult: Jennette Kettle, DO  Chief Complaint: Metastatic prostate cancer, bilateral hydronephrosis  History of Present Illness: 58 year old male with a history of urinary retention and bilateral hydronephrosis.  Obtain a PSA on the patient that revealed a PSA of 410.  He subsequent underwent a prostate biopsy that revealed prostate cancer in all 12 cores.  Maximum Gleason score was 9.  Patient does not have insurance and I have been trying to get him a metastatic work-up followed by scheduling for either androgen deprivation therapy or bilateral orchiectomy.    Presented to the emergency department at 6/28.  Hyperkalemia and creatinine of 2. Creatinine is essentially unchanged from prior values.  He underwent a CT scan which revealed bilateral hydronephrosis and a thick-walled bladder.  There was also metastatic lymphadenopathy and sclerotic lesions in the bone consistent with bone metastasis.  Patient mainly came in with complaints of increasing urinary complaints.  Urinalysis showed trace leukocytes negative nitrite, rare bacteria, greater than 50 WBCs.  He does have history urinary retention.  I had originally planned for a channel TURP to try to get him out of retention and hopefully help the hydronephrosis.  He had previously refused a Foley catheter.  Status post urethral dilation, Foley catheter placement, bilateral orchiectomy on 6/29.  Unable to identify ureteral orifices and no ureteral stents were placed.  Foley is put out 3 L since placement.  BMP essentially unchanged.  Past Medical History:  Diagnosis Date  . Diabetes mellitus, new onset ([place]) 07/2016  . Enlarged prostate   . HLD (hyperlipidemia)    Past Surgical History:  Procedure Laterality Date  . KNEE ARTHROSCOPY Right   . ORCHIECTOMY Bilateral 10/12/2018   Procedure: ORCHIECTOMY;  Surgeon: Lucas Mallow, MD;  Location: [facility];  Service: Urology;  Laterality: Bilateral;  . TRANSURETHRAL RESECTION  OF PROSTATE  10/12/2018   Procedure: CYSTOSCOPY WITH URETHRAL DILATION;  Surgeon: Lucas Mallow, MD;  Location: [facility];  Service: Urology;;    Home Medications:  Medications Prior to Admission  Medication Sig Dispense Refill Last Dose  . Blood Glucose Monitoring Suppl (RELION CONFIRM GLUCOSE MONITOR) w/Device KIT Use 4 times daily before meals and bedtime as directed. 1 kit 0   . finasteride (PROSCAR) 5 MG tablet Take 5 mg by mouth daily.   10/11/2018 at Unknown time  . glucose blood (RELION GLUCOSE TEST STRIPS) test strip Use as instructed 100 each 12   . Lancets 30G MISC Use 4 times daily before meals as directed. 100 each 0   . lisinopril (PRINIVIL,ZESTRIL) 20 MG tablet Take 10 tablets by mouth daily.    10/11/2018 at Unknown time  . tamsulosin (FLOMAX) 0.4 MG CAPS capsule Take 1 capsule (0.4 mg total) by mouth daily after supper. (Patient taking differently: Take 0.8 mg by mouth daily after supper. ) 30 capsule 0 10/11/2018 at Unknown time  . metFORMIN (GLUCOPHAGE-XR) 500 MG 24 hr tablet Take 1 tablet ('500mg'$ ) daily for the next 4 days, then begin taking 1 tablet ('500mg'$ ) two times every day (for a total of '1000mg'$  daily). (Patient not taking: Reported on 10/12/2018) 60 tablet 0 Not Taking at Unknown time   Allergies: No Known Allergies  Family History  Problem Relation Age of Onset  . Prostate cancer Father   . Heart disease Father    Social History:  reports that he quit smoking about 5 years ago. His smoking use included cigarettes. He has never used smokeless tobacco. He reports that he does  not drink alcohol or use drugs.  ROS: A complete review of systems was performed.  All systems are negative except for pertinent findings as noted. ROS   Physical Exam:  Vital signs in last 24 hours: Temp:  [97.6 F (36.4 C)-98.1 F (36.7 C)] 97.6 F (36.4 C) (06/30 0031) Pulse Rate:  [95-109] 101 (06/30 0031) Resp:  [15-26] 16 (06/30 0031) BP: (114-150)/(69-111) 122/78 (06/30  0031) SpO2:  [91 %-100 %] 100 % (06/30 0031) General:  Alert and oriented, No acute distress HEENT: Normocephalic, atraumatic Neck: No JVD or lymphadenopathy Cardiovascular: Regular rate and rhythm Lungs: Regular rate and effort Abdomen: Soft, nontender, nondistended, no abdominal masses GU: 18 French Foley draining light pink urine.  Midline scrotal incision clean dry and intact.  No scrotal hematoma Back: No CVA tenderness Extremities: No edema Neurologic: Grossly intact  Laboratory Data:  Results for orders placed or performed during the hospital encounter of 10/11/18 (from the past 24 hour(s))  Glucose, capillary     Status: Abnormal   Collection Time: 10/12/18  8:15 AM  Result Value Ref Range   Glucose-Capillary 107 (H) 70 - 99 mg/dL  Potassium     Status: Abnormal   Collection Time: 10/12/18  9:22 AM  Result Value Ref Range   Potassium 5.7 (H) 3.5 - 5.1 mmol/L  SARS Coronavirus 2 (CEPHEID - Performed in [facility] hospital lab), Hosp Order     Status: None   Collection Time: 10/12/18  9:55 AM   Specimen: Nasopharyngeal Swab  Result Value Ref Range   SARS Coronavirus 2 NEGATIVE NEGATIVE  MRSA PCR Screening     Status: None   Collection Time: 10/12/18 11:13 AM   Specimen: Nasal Mucosa; Nasopharyngeal  Result Value Ref Range   MRSA by PCR NEGATIVE NEGATIVE  Glucose, capillary     Status: None   Collection Time: 10/12/18 11:45 AM  Result Value Ref Range   Glucose-Capillary 88 70 - 99 mg/dL  Potassium     Status: Abnormal   Collection Time: 10/12/18 12:38 PM  Result Value Ref Range   Potassium 5.6 (H) 3.5 - 5.1 mmol/L  Glucose, capillary     Status: None   Collection Time: 10/12/18  3:16 PM  Result Value Ref Range   Glucose-Capillary 85 70 - 99 mg/dL  Protein / creatinine ratio, urine     Status: None   Collection Time: 10/12/18  3:43 PM  Result Value Ref Range   Creatinine, Urine 25.72 mg/dL   Total Protein, Urine <6 mg/dL   Protein Creatinine Ratio        0.00 -  0.15 mg/mg[Cre]  Glucose, capillary     Status: None   Collection Time: 10/12/18  4:59 PM  Result Value Ref Range   Glucose-Capillary 93 70 - 99 mg/dL  Glucose, capillary     Status: Abnormal   Collection Time: 10/12/18  8:46 PM  Result Value Ref Range   Glucose-Capillary 210 (H) 70 - 99 mg/dL  Glucose, capillary     Status: Abnormal   Collection Time: 10/13/18 12:28 AM  Result Value Ref Range   Glucose-Capillary 184 (H) 70 - 99 mg/dL  Glucose, capillary     Status: Abnormal   Collection Time: 10/13/18  4:13 AM  Result Value Ref Range   Glucose-Capillary 100 (H) 70 - 99 mg/dL  Renal function panel     Status: Abnormal   Collection Time: 10/13/18  4:37 AM  Result Value Ref Range   Sodium 145 135 -  145 mmol/L   Potassium 5.5 (H) 3.5 - 5.1 mmol/L   Chloride 115 (H) 98 - 111 mmol/L   CO2 21 (L) 22 - 32 mmol/L   Glucose, Bld 101 (H) 70 - 99 mg/dL   BUN 37 (H) 6 - 20 mg/dL   Creatinine, Ser 1.96 (H) 0.61 - 1.24 mg/dL   Calcium 8.1 (L) 8.9 - 10.3 mg/dL   Phosphorus 4.4 2.5 - 4.6 mg/dL   Albumin 2.5 (L) 3.5 - 5.0 g/dL   GFR calc non Af Amer 37 (L) >60 mL/min   GFR calc Af Amer 43 (L) >60 mL/min   Anion gap 9 5 - 15  CBC     Status: Abnormal   Collection Time: 10/13/18  4:37 AM  Result Value Ref Range   WBC 9.8 4.0 - 10.5 K/uL   RBC 3.67 (L) 4.22 - 5.81 MIL/uL   Hemoglobin 9.4 (L) 13.0 - 17.0 g/dL   HCT 32.5 (L) 39.0 - 52.0 %   MCV 88.6 80.0 - 100.0 fL   MCH 25.6 (L) 26.0 - 34.0 pg   MCHC 28.9 (L) 30.0 - 36.0 g/dL   RDW 16.1 (H) 11.5 - 15.5 %   Platelets 310 150 - 400 K/uL   nRBC 0.0 0.0 - 0.2 %  Magnesium     Status: None   Collection Time: 10/13/18  4:37 AM  Result Value Ref Range   Magnesium 2.2 1.7 - 2.4 mg/dL  Glucose, capillary     Status: None   Collection Time: 10/13/18  7:29 AM  Result Value Ref Range   Glucose-Capillary 80 70 - 99 mg/dL   Recent Results (from the past 240 hour(s))  Urine culture     Status: None (Preliminary result)   Collection Time:  10/11/18 11:23 PM   Specimen: Urine, Random  Result Value Ref Range Status   Specimen Description   Final    URINE, RANDOM Performed at [facility], [place] [address], [place], [place] [address]    Special Requests   Final    NONE Performed at [facility], [place] [address], [place], [place] [address]    Culture   Final    CULTURE REINCUBATED FOR BETTER GROWTH Performed at [facility], [place] [address] [place], [place] [address]    Report Status PENDING  Incomplete  Blood Culture (routine x 2)     Status: None (Preliminary result)   Collection Time: 10/11/18 11:23 PM   Specimen: BLOOD LEFT FOREARM  Result Value Ref Range Status   Specimen Description   Final    BLOOD LEFT FOREARM Performed at [facility], [place] [address], [place], [place] [address]    Special Requests   Final    BOTTLES DRAWN AEROBIC AND ANAEROBIC Blood Culture results may not be optimal due to an excessive volume of blood received in culture bottles Performed at [place] [address] [place], [place] [address]    Culture   Final    NO GROWTH 1 DAY Performed at [facility], [place] [address], [place],  [address]    Report Status PENDING  Incomplete  Blood Culture (routine x 2)     Status: None (Preliminary result)   Collection Time: 10/12/18 12:41 AM   Specimen: BLOOD LEFT HAND  Result Value Ref Range Status   Specimen Description   Final    BLOOD LEFT HAND Performed at [facility], [place]  [address] [place], [place] [address]    Special Requests   Final    BOTTLES DRAWN AEROBIC AND ANAEROBIC Blood Culture adequate volume Performed at [place] [address], [place], [place] [address]    Culture   Final    NO GROWTH 1 DAY Performed at [facility], [place] [address], [place], [place] [address]    Report Status PENDING  Incomplete  Novel Coronavirus,NAA,(SEND-OUT TO REF LAB - TAT 24-48  hrs); Hosp Order     Status: None   Collection Time: 10/12/18  3:21 AM   Specimen: Nasopharyngeal Swab; Respiratory  Result Value Ref Range Status   SARS-CoV-2, NAA NOT DETECTED NOT DETECTED Final    Comment: (NOTE) This test was developed and its performance characteristics determined by [employer]. This test has not been FDA cleared or approved. This test has been authorized by FDA under an Emergency Use Authorization (EUA). This test is only authorized for the duration of time the declaration that circumstances exist justifying the authorization of the emergency use of in vitro diagnostic tests for detection of SARS-CoV-2 virus and/or diagnosis of COVID-19 infection under section 564(b)(1) of the Act, 21 U.S.C. 662HUT-6(L)(4), unless the authorization is terminated or revoked sooner. When diagnostic testing is negative, the possibility of a false negative result should be considered in the context of a patient's recent exposures and the presence of clinical signs and symptoms consistent with COVID-19. An individual without symptoms of COVID-19 and who is not shedding SARS-CoV-2 virus would expect to have a negative (not detected) result in this assay. Performed  At: [facility] [place], [place] [id number] Rush Farmer MD CL:[id number]    [place]  Final    Comment: Performed at [place] [address] [place], [place] [address]  SARS Coronavirus 2 (CEPHEID - Performed in [place] hospital lab), Hosp Order     Status: None   Collection Time: 10/12/18  9:55 AM   Specimen: Nasopharyngeal Swab  Result Value Ref Range Status   SARS Coronavirus 2 NEGATIVE NEGATIVE Final    Comment: (NOTE) If result is NEGATIVE SARS-CoV-2 target nucleic acids are NOT DETECTED. The SARS-CoV-2 RNA is generally detectable in upper and lower  respiratory specimens during the acute phase of infection. The lowest   concentration of SARS-CoV-2 viral copies this assay can detect is 250  copies / mL. A negative result does not preclude SARS-CoV-2 infection  and should not be used as the sole basis for treatment or other  patient management decisions.  A negative result may occur with  improper specimen collection / handling, submission of specimen other  than nasopharyngeal swab, presence of viral mutation(s) within the  areas targeted by this assay, and inadequate number of viral copies  (<250 copies / mL). A negative result must be combined with clinical  observations, patient history, and epidemiological information. If result is POSITIVE SARS-CoV-2 target nucleic acids are DETECTED. The SARS-CoV-2 RNA is generally detectable in upper and lower  respiratory specimens dur ing the acute phase of infection.  Positive  results are indicative of active infection with SARS-CoV-2.  Clinical  correlation with patient history and other diagnostic information is  necessary to determine patient infection status.  Positive results do  not rule out bacterial infection or co-infection with other viruses. If result is PRESUMPTIVE POSTIVE SARS-CoV-2 nucleic acids MAY BE PRESENT.   A presumptive positive result was obtained on the submitted specimen  and confirmed  on repeat testing.  While 2019 novel coronavirus  (SARS-CoV-2) nucleic acids may be present in the submitted sample  additional confirmatory testing may be necessary for epidemiological  and / or clinical management purposes  to differentiate between  SARS-CoV-2 and other Sarbecovirus currently known to infect humans.  If clinically indicated additional testing with an alternate test  methodology [phone]) is advised. The SARS-CoV-2 RNA is generally  detectable in upper and lower respiratory sp ecimens during the acute  phase of infection. The expected result is Negative. Fact Sheet for Patients:  StrictlyIdeas.no Fact Sheet  for Healthcare Providers: BankingDealers.co.za This test is not yet approved or cleared by the [place] FDA and has been authorized for detection and/or diagnosis of SARS-CoV-2 by FDA under an Emergency Use Authorization (EUA).  This EUA will remain in effect (meaning this test can be used) for the duration of the COVID-19 declaration under Section 564(b)(1) of the Act, 21 U.S.C. section 360bbb-3(b)(1), unless the authorization is terminated or revoked sooner. Performed at [facility], [place] [address] [place], [place] [address]   MRSA PCR Screening     Status: None   Collection Time: 10/12/18 11:13 AM   Specimen: Nasal Mucosa; Nasopharyngeal  Result Value Ref Range Status   MRSA by PCR NEGATIVE NEGATIVE Final    Comment:        The GeneXpert MRSA Assay (FDA approved for NASAL specimens only), is one component of a comprehensive MRSA colonization surveillance program. It is not intended to diagnose MRSA infection nor to guide or monitor treatment for MRSA infections. 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  Favor waiting to see if BMP improved with Foley but given abnormalities on BMP would appreciate their input   -Foley catheter drainage   -Medical oncology follow-up to be determined for metastatic prostate cancer   Please obtain social work consultation and  see if he can be assisted in obtaining insurance.  This is very important given his new diagnosis and his need for ongoing care of his metastatic prostate cancer.  Tharon Aquas 10/13/2018, 7:32 AM

## 2018-10-13 NOTE — Progress Notes (Signed)
Patient currently on Q4 CBG orders. Was placed on a carb modified/renal diet with a 1200 fluid restriction today. PO intake adequate, having no nausea or vomiting. On call NP Schorr paged to change CBG orders to ACHS with meal coverage. New orders put in. Patient made aware and educated. Agreeable to plan. Will continue to monitor.

## 2018-10-13 NOTE — Progress Notes (Signed)
PROGRESS NOTE    Jeffery [name]  GNF:[id number] DOB: 22-Aug-1960 DOA: 10/11/2018 PCP: [name], MD    Brief Narrative:  HPI per Dr. Loreli Slot is a 58 y.o. male with medical history significant of DM2, enlarged prostate.  Patient s/p prostate Bx from 2 weeks ago.  Presents with c/o [place] retention.  Patient states that he has been having increased frequency and urgency to go to the bathroom. When he does go urinate rarely any urine output occurs. He spoke with his urologist and he was advised to perhaps consider self cath and he has been doing so with minimal voiding. He is concerned that he is retaining urine. He denies any abdominal discomfort with it. Review of system is positive for hematuria and dysuria.   ED Course: Bladder scan initially showed no urine in bladder, then repeat showed just 80 cc in bladder.  UA shows trace LE, mod blood, >50 RBC and WBC.  Rare bacteria  WBC 11.  K 5.9, bicarb 16, creat 2.0 up from baseline 1.5.  Was 2.0 in March this year but looks like he was having mod R and mild L hydronephrosis at that time as well.  Persistently tachy to 116.  Given Bicarb, calcium gluconate, lokelma for hyperkalemia.  Given levaquin for UTI vs Prostatitis.  Assessment & Plan:   Principal Problem:   Prostate cancer metastatic to multiple sites [facility]) Active Problems:   Acute renal failure superimposed on stage 3 chronic kidney disease (HCC)   Urinary retention   Hydronephrosis   DM2 (diabetes mellitus, type 2) (HCC)   Dysuria   CKD (chronic kidney disease) stage 3, GFR 30-59 ml/min (HCC)   Hyperkalemia   Metabolic acidosis, normal anion gap (NAG)   Vitamin B12 deficiency   Acute cystitis with hematuria   Acute hyperkalemia  1 metastatic prostate cancer/metastatic lymphadenopathy/bone mets Patient had presented with urinary retention.  Work-up including CT stone protocol worrisome for metastatic prostate cancer.   Oncology consulted and patient was assessed.  Patient subsequently underwent bilateral simple scrotal orchiectomy, cystoscopy with urethral dilatation.  Stents were unable to be placed during cystoscopy with urethral dilatation as bilateral ureteral orifices were unable to be located.  Status post Foley catheter placement with good urine output of approximately 3 L over the past 24 hours.  Patient being followed by urology.  Will consult with medical oncology for further evaluation and management.  2.  Bilateral hydronephrosis Secondary to problem #1.  No significant change with renal function postoperatively and with placement of Foley catheter.  Foley catheter with good drainage with 3 L urine output over the past 24 hours.  Urology recommending nephrology consultation for further evaluation of renal function and input regarding nephrostomy tubes.  Per urology.  3.  Acute on chronic kidney disease stage III Baseline creatinine approximately per urology.  Likely secondary to a post renal azotemia secondary to problems #1 into.  CT abdomen was highly suspicious for metastatic bladder cancer with moderate bilateral hydronephrosis, diffuse asymmetric bladder wall thickening with right lateral bladder wall and large pelvic inguinal retroperitoneal nodes, diffuse osteosclerotic lesions partially visualized groundglass opacity in right lower lobe.  Urinalysis which was done with trace leukocytes, nitrite negative, greater than 50 WBCs, greater than 50 RBCs.  Urine potassium was 16, urine sodium was 44, urine creatinine of 38.31.  We will check a urine microalbumin to protein ratio.  Foley catheter was placed per urology perioperatively and patient with a urine output of 3 L  over the past 24 hours.  Patient's ACE inhibitor has been discontinued.  Avoid nephrotoxic agents.  Will not resume metformin.  Avoid NSAIDs.  Nephrology consulted for further evaluation and management.  4.  Hyperkalemia/metabolic  acidosis Likely secondary to problem #1 2 and 3 in the setting of ACE inhibitor.  ACE inhibitor has been discontinued.  No significant change with potassium level currently at 5.5 today.  Patient been seen by nephrology and patient placed on bicarb drip.  Lokelma has been increased to 10 mg 3 times daily per nephrology.  Will discontinue half-normal saline.  Patient status post calcium chloride on admission 10/12/2018.  Repeat potassium levels this afternoon.  5.  Diabetes mellitus type 2 Hemoglobin A1c of 6.5.  Metformin discontinued.  CBG of 80 this morning.  Patient however n.p.o.  Patient will be started on a diet.  Follow CBGs.  Continue sliding scale insulin.  6.  Urinary retention Likely secondary to problems #1 and 2.  Foley catheter placed with a urine output of 3 L.  Likely discharge on Foley catheter per urology.  Continue Proscar and Flomax.  Follow.  7.  Probable UTI Urine cultures pending.  Patient currently on IV cefepime which we will continue now until cultures are finalized as patient with recent instrumentation.  Monitor temperature.  Follow.  8.  Anemia of chronic disease/vitamin B12 deficiency. Anemia panel consistent with anemia of chronic disease and vitamin B12 deficiency.  Placed on vitamin B12 1000 MCG's subcutaneously daily x1 week, and then weekly x1 month and then monthly.  Follow H&H.  Transfusion threshold hemoglobin less than 7.   DVT prophylaxis: SCDs Code Status: Full Family Communication: Updated patient.  No family at bedside. Disposition Plan: Likely discharge home when okay with urology and nephrology.   Consultants:   Urology: Dr. Gloriann Loan 10/12/2018  Nephrology: Dr. Justin Mend 10/13/2018  Procedures:   CT renal stone protocol 10/12/2018  Bilateral simple scrotal orchiectomy/cystoscopy with urethral dilatation per Dr. Gloriann Loan 10/12/2018  Antimicrobials:  IV cefepime 10/12/2018   Subjective: Patient sleeping however easily arousable.  Patient complained of  some lower abdominal discomfort/pain.  Patient hungry asking for something to eat and drink.  No shortness of breath.  No chest pain.  Patient with good urine output in Foley bag.  Objective: Vitals:   10/12/18 1646 10/12/18 2033 10/13/18 0031 10/13/18 1528  BP: (!) 150/94 127/69 122/78 115/74  Pulse: (!) 102 95 (!) 101 (!) 110  Resp: 16 16 16 16   Temp: 97.7 F (36.5 C) 98.1 F (36.7 C) 97.6 F (36.4 C) 97.8 F (36.6 C)  TempSrc: Oral Oral Oral Oral  SpO2: 100% 97% 100% 94%  Weight:      Height:        Intake/Output Summary (Last 24 hours) at 10/13/2018 1703 Last data filed at 10/13/2018 1655 Gross per 24 hour  Intake 3475.93 ml  Output 4725 ml  Net -1249.07 ml   Filed Weights   10/12/18 0637  Weight: 99.9 kg    Examination:  General exam: Appears calm and comfortable  Respiratory system: Clear to auscultation. Respiratory effort normal. Cardiovascular system: S1 & S2 heard, RRR. No JVD, murmurs, rubs, gallops or clicks. No pedal edema. Gastrointestinal system: Abdomen is nondistended, soft and nontender. No organomegaly or masses felt. Normal bowel sounds heard. Central nervous system: Alert and oriented. No focal neurological deficits. Extremities: Symmetric 5 x 5 power. Skin: No rashes, lesions or ulcers Psychiatry: Judgement and insight appear normal. Mood & affect appropriate.  Data Reviewed: I have personally reviewed following labs and imaging studies  CBC: Recent Labs  Lab 10/11/18 2250 10/13/18 0437  WBC 11.3* 9.8  NEUTROABS 7.4  --   HGB 11.2* 9.4*  HCT 38.8* 32.5*  MCV 87.8 88.6  PLT 416* 443   Basic Metabolic Panel: Recent Labs  Lab 10/11/18 2250 10/12/18 0448 10/12/18 0922 10/12/18 1238 10/13/18 0437 10/13/18 1455  NA 141 143  --   --  145  --   K 5.9* 5.7* 5.7* 5.6* 5.5* 4.7  CL 115* 115*  --   --  115*  --   CO2 16* 20*  --   --  21*  --   GLUCOSE 134* 122*  --   --  101*  --   BUN 47* 46*  --   --  37*  --   CREATININE 2.06*  2.03*  --   --  1.96*  --   CALCIUM 9.1 9.1  --   --  8.1*  --   MG  --   --   --   --  2.2  --   PHOS  --   --   --   --  4.4  --    GFR: Estimated Creatinine Clearance: 49.6 mL/min (A) (by C-G formula based on SCr of 1.96 mg/dL (H)). Liver Function Tests: Recent Labs  Lab 10/13/18 0437  ALBUMIN 2.5*   No results for input(s): LIPASE, AMYLASE in the last 168 hours. No results for input(s): AMMONIA in the last 168 hours. Coagulation Profile: No results for input(s): INR, PROTIME in the last 168 hours. Cardiac Enzymes: No results for input(s): CKTOTAL, CKMB, CKMBINDEX, TROPONINI in the last 168 hours. BNP (last 3 results) No results for input(s): PROBNP in the last 8760 hours. HbA1C: Recent Labs    10/12/18 0448  HGBA1C 6.5*   CBG: Recent Labs  Lab 10/12/18 2046 10/13/18 0028 10/13/18 0413 10/13/18 0729 10/13/18 1214  GLUCAP 210* 184* 100* 80 86   Lipid Profile: No results for input(s): CHOL, HDL, LDLCALC, TRIG, CHOLHDL, LDLDIRECT in the last 72 hours. Thyroid Function Tests: No results for input(s): TSH, T4TOTAL, FREET4, T3FREE, THYROIDAB in the last 72 hours. Anemia Panel: Recent Labs    10/13/18 0819  VITAMINB12 166*  FOLATE 10.5  FERRITIN 631*  TIBC 179*  IRON 46   Sepsis Labs: Recent Labs  Lab 10/11/18 2323 10/12/18 0448  LATICACIDVEN 0.8 0.8    Recent Results (from the past 240 hour(s))  Urine culture     Status: None (Preliminary result)   Collection Time: 10/11/18 11:23 PM   Specimen: Urine, Random  Result Value Ref Range Status   Specimen Description   Final    URINE, RANDOM Performed at [facility] [address] [place], [place] [address]    Special Requests   Final    NONE Performed at [facility] [place] [address], [place], [place] [address]    Culture   Final    CULTURE REINCUBATED FOR BETTER GROWTH Performed at [facility], [place] [address], [place], [place] [address]    Report  Status PENDING  Incomplete  Blood Culture (routine x 2)     Status: None (Preliminary result)   Collection Time: 10/11/18 11:23 PM   Specimen: BLOOD LEFT FOREARM  Result Value Ref Range Status   Specimen Description   Final    BLOOD LEFT FOREARM Performed at [facility], [place] [address],  [place], [place] [address]    Special Requests   Final    BOTTLES DRAWN AEROBIC AND ANAEROBIC Blood Culture results may not be optimal due to an excessive volume of blood received in culture bottles Performed at [place] [address], [place], [place] [address]    Culture   Final    NO GROWTH 1 DAY Performed at [facility], [place] [address], [place], [place] [address]    Report Status PENDING  Incomplete  Blood Culture (routine x 2)     Status: None (Preliminary result)   Collection Time: 10/12/18 12:41 AM   Specimen: BLOOD LEFT HAND  Result Value Ref Range Status   Specimen Description   Final    BLOOD LEFT HAND Performed at [facility], [place] [address] [place], [place] [address]    Special Requests   Final    BOTTLES DRAWN AEROBIC AND ANAEROBIC Blood Culture adequate volume Performed at [place] [address], [place], [place] [address]    Culture   Final    NO GROWTH 1 DAY Performed at [facility], [place] [address] [place], [place] [address]    Report Status PENDING  Incomplete  Novel Coronavirus,NAA,(SEND-OUT TO REF LAB - TAT 24-48 hrs); Hosp Order     Status: None   Collection Time: 10/12/18  3:21 AM   Specimen: Nasopharyngeal Swab; Respiratory  Result Value Ref Range Status   SARS-CoV-2, NAA NOT DETECTED NOT DETECTED Final    Comment: (NOTE) This test was developed and its performance characteristics determined by [employer]. This test has not been FDA cleared or approved. This test has been authorized by FDA under an Emergency Use Authorization (EUA). This test is only authorized for the duration  of time the declaration that circumstances exist justifying the authorization of the emergency use of in vitro diagnostic tests for detection of SARS-CoV-2 virus and/or diagnosis of COVID-19 infection under section 564(b)(1) of the Act, 21 U.S.C. 578ION-6(E)(9), unless the authorization is terminated or revoked sooner. When diagnostic testing is negative, the possibility of a false negative result should be considered in the context of a patient's recent exposures and the presence of clinical signs and symptoms consistent with COVID-19. An individual without symptoms of COVID-19 and who is not shedding SARS-CoV-2 virus would expect to have a negative (not detected) result in this assay. Performed  At: [facility] [place], [place] [id number] Rush Farmer MD WN:[id number]    [place]  Final    Comment: Performed at [facility] [address] [place], [place] [address]  SARS Coronavirus 2 (CEPHEID - Performed in [place] hospital lab), Hosp Order     Status: None   Collection Time: 10/12/18  9:55 AM   Specimen: Nasopharyngeal Swab  Result Value Ref Range Status   SARS Coronavirus 2 NEGATIVE NEGATIVE Final    Comment: (NOTE) If result is NEGATIVE SARS-CoV-2 target nucleic acids are NOT DETECTED. The SARS-CoV-2 RNA is generally detectable in upper and lower  respiratory specimens during the acute phase of infection. The lowest  concentration of SARS-CoV-2 viral copies this assay can detect is 250  copies / mL. A negative result does not preclude SARS-CoV-2 infection  and should not be used as the sole basis for treatment or other  patient management decisions.  A negative result may occur with  improper specimen collection / handling, submission of specimen other  than nasopharyngeal swab,  presence of viral mutation(s) within the  areas targeted by this assay, and inadequate number of viral copies  (<250 copies  / mL). A negative result must be combined with clinical  observations, patient history, and epidemiological information. If result is POSITIVE SARS-CoV-2 target nucleic acids are DETECTED. The SARS-CoV-2 RNA is generally detectable in upper and lower  respiratory specimens dur ing the acute phase of infection.  Positive  results are indicative of active infection with SARS-CoV-2.  Clinical  correlation with patient history and other diagnostic information is  necessary to determine patient infection status.  Positive results do  not rule out bacterial infection or co-infection with other viruses. If result is PRESUMPTIVE POSTIVE SARS-CoV-2 nucleic acids MAY BE PRESENT.   A presumptive positive result was obtained on the submitted specimen  and confirmed on repeat testing.  While 2019 novel coronavirus  (SARS-CoV-2) nucleic acids may be present in the submitted sample  additional confirmatory testing may be necessary for epidemiological  and / or clinical management purposes  to differentiate between  SARS-CoV-2 and other Sarbecovirus currently known to infect humans.  If clinically indicated additional testing with an alternate test  methodology [phone]) is advised. The SARS-CoV-2 RNA is generally  detectable in upper and lower respiratory sp ecimens during the acute  phase of infection. The expected result is Negative. Fact Sheet for Patients:  StrictlyIdeas.no Fact Sheet for Healthcare Providers: BankingDealers.co.za This test is not yet approved or cleared by the [place] FDA and has been authorized for detection and/or diagnosis of SARS-CoV-2 by FDA under an Emergency Use Authorization (EUA).  This EUA will remain in effect (meaning this test can be used) for the duration of the COVID-19 declaration under Section 564(b)(1) of the Act, 21 U.S.C. section 360bbb-3(b)(1), unless the authorization is terminated or revoked  sooner. Performed at [facility], [place] [address] [place], [place] [address]   MRSA PCR Screening     Status: None   Collection Time: 10/12/18 11:13 AM   Specimen: Nasal Mucosa; Nasopharyngeal  Result Value Ref Range Status   MRSA by PCR NEGATIVE NEGATIVE Final    Comment:        The GeneXpert MRSA Assay (FDA approved for NASAL specimens only), is one component of a comprehensive MRSA colonization surveillance program. It is not intended to diagnose MRSA infection nor to guide or monitor treatment for MRSA infections. Performed at [facility], [place] [address] [place] [employer], [place] [address]          Radiology Studies: Dg C-arm 1-60 Min-no Report  Result Date: 10/12/2018 Fluoroscopy was utilized by the requesting physician.  No radiographic interpretation.   Ct Renal Stone Study  Result Date: 10/12/2018 CLINICAL DATA:  Urinary retention EXAM: CT ABDOMEN AND PELVIS WITHOUT CONTRAST TECHNIQUE: Multidetector CT imaging of the abdomen and pelvis was performed following the standard protocol without IV contrast. COMPARISON:  July 29, 2016 FINDINGS: Lower chest: There is a partially visualized ground-glass airspace opacity in the right lower lobe measuring approximately 1.4 cm (axial series 4, image 1). There is atelectasis at the lung bases, right worse than left.)The heart size is normal. Hepatobiliary: The liver is normal. Cholelithiasis without acute inflammation.There is no biliary ductal dilation. Pancreas: Normal contours without ductal dilatation. No peripancreatic fluid collection. Spleen: No splenic laceration or hematoma. Adrenals/Urinary Tract: --Adrenal glands: No adrenal hemorrhage. --Right kidney/ureter: There is moderate to severe right-sided hydronephrosis without evidence of an obstructing stone. There is an abrupt change in caliber of  the distal right ureter proximal to the right UVJ. --Left kidney/ureter: There is moderate severe  left-sided hydronephrosis without evidence of an obstructing stone. --Urinary bladder: There is diffuse asymmetric urinary bladder wall thickening. There is adjacent fat stranding and free fluid. Stomach/Bowel: --Stomach/Duodenum: No hiatal hernia or other gastric abnormality. Normal duodenal course and caliber. --Small bowel: No dilatation or inflammation. --Colon: Rectosigmoid diverticulosis without acute inflammation. --Appendix: Normal. Vascular/Lymphatic: Normal course and caliber of the major abdominal vessels. --there are multiple pathologically enlarged retroperitoneal lymph nodes. --No mesenteric lymphadenopathy. --there are pathologically enlarged pelvic and inguinal lymph nodes. For example there is a 1.6 cm enlarged left pelvic sidewall lymph node. Reproductive: The prostate gland is mildly enlarged measuring approximately 4.2 by 3.8 cm. Other: There is a trace amount of free fluid in the pelvis. The abdominal wall is normal. Musculoskeletal. There are extensive sclerotic osseous lesions throughout essentially all of the visualized osseous structures. There is no pathologic fracture identified at this time. Large sclerotic lesions are noted involving both proximal femurs. IMPRESSION: 1. Overall findings highly suspicious for metastatic bladder cancer. There is moderate bilateral hydronephrosis. On the left, the level of obstruction is at the urinary bladder. On the right, the level of obstruction is proximal to the urinary bladder in the distal right ureter, raising suspicion for an ascending transitional cell carcinoma. There is diffuse asymmetric bladder wall thickening, greatest along the right lateral bladder wall. In addition, there are pathologically enlarged pelvic, inguinal, and retroperitoneal lymph nodes. 2. Diffuse osseous sclerotic lesions involving virtually all of the visualized osseous structures is highly concerning for osseous metastatic disease. This is new since CT in 2018. 3.  Partially visualized ground-glass airspace opacity in the right lower lobe as detailed above. This could represent a metastatic focus within the thorax. Consider a nonemergent follow-up CT of the chest for further evaluation. 4. Cholelithiasis without CT evidence for acute cholecystitis. 5. The prostate gland is only mildly enlarged. Electronically Signed   By: Constance Holster M.D.   On: 10/12/2018 03:28        Scheduled Meds:  cyanocobalamin  1,000 mcg Subcutaneous Daily   feeding supplement (ENSURE ENLIVE)  237 mL Oral Q24H   feeding supplement (PRO-STAT SUGAR FREE 64)  30 mL Oral TID   finasteride  5 mg Oral Daily   insulin aspart  0-9 Units Subcutaneous Q4H   multivitamin with minerals  1 tablet Oral Daily   sodium zirconium cyclosilicate  10 g Oral TID   tamsulosin  0.4 mg Oral QPC supper   Continuous Infusions:  ceFEPime (MAXIPIME) IV 2 g (10/13/18 1616)    sodium bicarbonate (isotonic) infusion in sterile water 150 mL/hr at 10/13/18 1312     LOS: 1 day    Time spent: 40 minutes    Irine Seal, MD [facility]  If 7PM-7AM, please contact night-coverage [email or web address] 10/13/2018, 5:03 PM

## 2018-10-14 ENCOUNTER — Encounter (HOSPITAL_COMMUNITY): Payer: Self-pay | Admitting: Oncology

## 2018-10-14 ENCOUNTER — Telehealth: Payer: Self-pay | Admitting: Urology

## 2018-10-14 DIAGNOSIS — N1339 Other hydronephrosis: Secondary | ICD-10-CM

## 2018-10-14 DIAGNOSIS — R339 Retention of urine, unspecified: Secondary | ICD-10-CM

## 2018-10-14 DIAGNOSIS — N179 Acute kidney failure, unspecified: Secondary | ICD-10-CM

## 2018-10-14 DIAGNOSIS — C61 Malignant neoplasm of prostate: Principal | ICD-10-CM

## 2018-10-14 LAB — CBC WITH DIFFERENTIAL/PLATELET
Abs Immature Granulocytes: 0.22 10*3/uL — ABNORMAL HIGH (ref 0.00–0.07)
Basophils Absolute: 0 10*3/uL (ref 0.0–0.1)
Basophils Relative: 0 %
Eosinophils Absolute: 0.2 10*3/uL (ref 0.0–0.5)
Eosinophils Relative: 2 %
HCT: 32.2 % — ABNORMAL LOW (ref 39.0–52.0)
Hemoglobin: 9.3 g/dL — ABNORMAL LOW (ref 13.0–17.0)
Immature Granulocytes: 2 %
Lymphocytes Relative: 22 %
Lymphs Abs: 2 10*3/uL (ref 0.7–4.0)
MCH: 25.7 pg — ABNORMAL LOW (ref 26.0–34.0)
MCHC: 28.9 g/dL — ABNORMAL LOW (ref 30.0–36.0)
MCV: 89 fL (ref 80.0–100.0)
Monocytes Absolute: 0.7 10*3/uL (ref 0.1–1.0)
Monocytes Relative: 7 %
Neutro Abs: 6.1 10*3/uL (ref 1.7–7.7)
Neutrophils Relative %: 67 %
Platelets: 324 10*3/uL (ref 150–400)
RBC: 3.62 MIL/uL — ABNORMAL LOW (ref 4.22–5.81)
RDW: 15.9 % — ABNORMAL HIGH (ref 11.5–15.5)
WBC: 9.2 10*3/uL (ref 4.0–10.5)
nRBC: 0 % (ref 0.0–0.2)

## 2018-10-14 LAB — RENAL FUNCTION PANEL
Albumin: 2.5 g/dL — ABNORMAL LOW (ref 3.5–5.0)
Anion gap: 7 (ref 5–15)
BUN: 39 mg/dL — ABNORMAL HIGH (ref 6–20)
CO2: 28 mmol/L (ref 22–32)
Calcium: 8.1 mg/dL — ABNORMAL LOW (ref 8.9–10.3)
Chloride: 109 mmol/L (ref 98–111)
Creatinine, Ser: 2 mg/dL — ABNORMAL HIGH (ref 0.61–1.24)
GFR calc Af Amer: 42 mL/min — ABNORMAL LOW (ref >60)
GFR calc non Af Amer: 36 mL/min — ABNORMAL LOW (ref >60)
Glucose, Bld: 130 mg/dL — ABNORMAL HIGH (ref 70–99)
Phosphorus: 3.7 mg/dL (ref 2.5–4.6)
Potassium: 4.5 mmol/L (ref 3.5–5.1)
Sodium: 144 mmol/L (ref 135–145)

## 2018-10-14 LAB — MICROALBUMIN / CREATININE URINE RATIO
Creatinine, Urine: 49.6 mg/dL
Microalb Creat Ratio: 235 mg/g creat — ABNORMAL HIGH (ref 0–29)
Microalb, Ur: 116.6 ug/mL — ABNORMAL HIGH

## 2018-10-14 LAB — GLUCOSE, CAPILLARY
Glucose-Capillary: 87 mg/dL (ref 70–99)
Glucose-Capillary: 100 mg/dL — ABNORMAL HIGH (ref 70–99)

## 2018-10-14 MED ORDER — ADULT MULTIVITAMIN W/MINERALS CH: 1.0000 | ORAL_TABLET | Freq: Every day | ORAL | Status: AC

## 2018-10-14 MED ORDER — VITAMIN B-12 1000 MCG PO TABS: 1000.0000 ug | ORAL_TABLET | Freq: Every day | ORAL | 0 refills | Status: AC

## 2018-10-14 MED ORDER — ENSURE ENLIVE PO LIQD: 237.0000 mL | ORAL | 12 refills | Status: AC

## 2018-10-14 MED ORDER — TAMSULOSIN HCL 0.4 MG PO CAPS: 0.8000 mg | ORAL_CAPSULE | Freq: Every day | ORAL | Status: AC

## 2018-10-14 MED ORDER — PRO-STAT SUGAR FREE PO LIQD: 30.0000 mL | Freq: Three times a day (TID) | ORAL | 0 refills | Status: AC

## 2018-10-14 MED ORDER — CEFDINIR 300 MG PO CAPS: 600.0000 mg | ORAL_CAPSULE | Freq: Every day | ORAL | 0 refills | Status: AC

## 2018-10-14 NOTE — Telephone Encounter (Signed)
Patient is scheduled with [facility] on 10/22/18 at 7:45AM with a Renal ultrasound prior.

## 2018-10-14 NOTE — Consult Note (Signed)
Consult request for medical oncology received.  Full note to follow.  This is a 58 year old man currently of [place] presented with acute renal failure and urinary retention and a PSA of 410.  He underwent a prostate biopsy which showed a Gleason score of of 9 and 12 out of 12 cores.  He was scheduled to start androgen deprivation therapy but presented with acute hospitalization with hyperkalemia and bilateral hydronephrosis.  He underwent bilateral orchiectomy performed by Dr. Gloriann Loan on 10/12/2018.  Staging work-up including CT scan on 10/12/2018 showed bilateral hydronephrosis with diffuse adenopathy as well as sclerotic bone lesions.  Clinically he does report some abdominal discomfort with Foley catheter remains in place.  He denies constitutional symptoms or excessive fatigue.  He reports the symptoms have started probably close to 4 years ago.  The natural course of this disease was reviewed today with the patient.  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  The role of additional therapy in the form of Taxotere chemotherapy, Zytiga, Xtandi on other agents was discussed.  These can be added in the future and not indicated right now.  He understands that the backbone of treating advanced prostate cancer is androgen deprivation which she is achieved successfully.  I agree with the current management and I will arrange oncology follow-up for him upon his discharge in the next 3 to 4 weeks to discuss additional therapy.

## 2018-10-14 NOTE — Progress Notes (Signed)
   10/14/18 1451  AVS Discharge Documentation  AVS Discharge Instructions Including Medications Provided to patient/caregiver  Name of Person Receiving AVS Discharge Instructions Including Medications Jeffery [name]  Name of Clinician That Reviewed AVS Discharge Instructions Including Medications Rich Reining   Patient was given foley care teaching instructions, stated he had a foley for about 2 months at one point. Does want to d/c with the drain bag. Provided x2 leg bags, x1 drain bag.

## 2018-10-14 NOTE — Consult Note (Addendum)
[facility]  [phone] [phone]   MEDICAL ONCOLOGY - INITIAL CONSULTATION  Referral MD: Dr. Irine Seal  Reason for Referral: Metastatic prostate cancer  HPI: [name] is a 58 year old male with a past medical history significant for diabetes and hyperlipidemia.  The patient was admitted to the hospital with urinary retention.  He reported increased frequency and urgency to go to the bathroom.  However, when he tries to urinate he has very little urine output.  He reports that he attempted to self cath at home and had difficulty passing the catheter.  Lab work performed on admission showed a white blood cell count of 11.3, hemoglobin 11.2, potassium 5.9, BUN 47, creatinine 2.06.  A CT of the abdomen pelvis without contrast showed findings suspicious for metastatic bladder cancer, bilateral hydronephrosis, pathologically enlarged pelvic, inguinal, retroperitoneal lymph nodes, diffuse osseous sclerotic lesions involving virtually all of the visualized osseous structures is highly concerning for osseous metastatic disease. He has been followed by Urology as an outpatient due to urinary retention.  Office note from [facility] from 08/04/2018 and biopsy visit from 09/17/2018 are scanned into epic and have been reviewed.  A PSA drawn in the outpatient setting is elevated at 410.  He underwent a prostate biopsy on 09/17/2018 which showed metastatic prostate cancer in all 12 cores with a Gleason score of 9.  The patient underwent bilateral orchiectomy on 10/12/2018.  Today, the patient states that he has mild fatigue, anorexia, and weight loss of about 20 pounds over the past 12 weeks.  Denies any dizziness and seizure like activity but reports headaches which she has had intermittently over the past few weeks.  Denies chest discomfort, shortness of breath, cough.  Denies abdominal pain, nausea, vomiting, constipation, diarrhea. Has not noticed any gross hematuria.  Reports urinary frequency and incomplete bladder emptying. States that he has had pain in his pelvis for about 6 weeks. Medical Oncology was asked to see the patient regarding his metastatic prostate cancer.     Past Medical History:  Diagnosis Date  . Diabetes mellitus, new onset ([place]) 07/2016  . Enlarged prostate   . HLD (hyperlipidemia)   :  Past Surgical History:  Procedure Laterality Date  . KNEE ARTHROSCOPY Right   . ORCHIECTOMY Bilateral 10/12/2018   Procedure: ORCHIECTOMY;  Surgeon: Lucas Mallow, MD;  Location: [facility];  Service: Urology;  Laterality: Bilateral;  . TRANSURETHRAL RESECTION OF PROSTATE  10/12/2018   Procedure: CYSTOSCOPY WITH URETHRAL DILATION;  Surgeon: Lucas Mallow, MD;  Location: [facility];  Service: Urology;;  :  Current Facility-Administered Medications  Medication Dose Route Frequency Provider Last Rate Last Dose  . acetaminophen (TYLENOL) tablet 650 mg  650 mg Oral Q6H PRN Etta Quill, DO   650 mg at 10/13/18 0149   Or  . acetaminophen (TYLENOL) suppository 650 mg  650 mg Rectal Q6H PRN Etta Quill, DO      . ceFEPIme (MAXIPIME) 2 g in sodium chloride 0.9 % 100 mL IVPB  2 g Intravenous Q12H Leodis Sias T, RPH 200 mL/hr at 10/14/18 0331 2 g at 10/14/18 0331  . cyanocobalamin ((VITAMIN B-12)) injection 1,000 mcg  1,000 mcg Subcutaneous Daily Eugenie Filler, MD   1,000 mcg at 10/13/18 1333  . feeding supplement (ENSURE ENLIVE) (ENSURE ENLIVE) liquid 237 mL  237 mL Oral Q24H Eugenie Filler, MD   Stopped at 10/13/18 1332  . feeding supplement (PRO-STAT SUGAR FREE 64) liquid  30 mL  30 mL Oral TID Eugenie Filler, MD   30 mL at 10/13/18 2052  . finasteride (PROSCAR) tablet 5 mg  5 mg Oral Daily Jennette Kettle M, DO   5 mg at 10/13/18 1330  . HYDROcodone-acetaminophen (NORCO/VICODIN) 5-325 MG per tablet 1-2 tablet  1-2 tablet Oral Q4H PRN Etta Quill, DO   2 tablet at 10/14/18 0331  . insulin aspart (novoLOG) injection 0-9 Units   0-9 Units Subcutaneous TID WC Eugenie Filler, MD      . multivitamin with minerals tablet 1 tablet  1 tablet Oral Daily Eugenie Filler, MD   1 tablet at 10/13/18 1538  . ondansetron (ZOFRAN) tablet 4 mg  4 mg Oral Q6H PRN Etta Quill, DO       Or  . ondansetron [facility]) injection 4 mg  4 mg Intravenous Q6H PRN Etta Quill, DO      . sodium bicarbonate 150 mEq in sterile water 1,000 mL infusion   Intravenous Continuous Edrick Oh, MD 150 mL/hr at 10/14/18 0402    . sodium zirconium cyclosilicate (LOKELMA) packet 10 g  10 g Oral TID Edrick Oh, MD   10 g at 10/13/18 1327  . tamsulosin (FLOMAX) capsule 0.4 mg  0.4 mg Oral QPC supper Etta Quill, DO   0.4 mg at 10/13/18 1753     No Known Allergies:  Family History  Problem Relation Age of Onset  . Prostate cancer Father   . Heart disease Father   :  Social History   Socioeconomic History  . Marital status: Divorced    Spouse name: Not on file  . Number of children: 1  . Years of education: Not on file  . Highest education level: Not on file  Occupational History  . Occupation: Teaching laboratory technician  Social Needs  . Financial resource strain: Not on file  . Food insecurity    Worry: Not on file    Inability: Not on file  . Transportation needs    Medical: Not on file    Non-medical: Not on file  Tobacco Use  . Smoking status: Former Smoker    Types: Cigarettes    Quit date: 2015    Years since quitting: 5.5  . Smokeless tobacco: Never Used  . Tobacco comment: 2015  Substance and Sexual Activity  . Alcohol use: No  . Drug use: No  . Sexual activity: Not on file  Lifestyle  . Physical activity    Days per week: Not on file    Minutes per session: Not on file  . Stress: Not on file  Relationships  . Social Herbalist on phone: Not on file    Gets together: Not on file    Attends religious service: Not on file    Active member of club or organization: Not on file    Attends  meetings of clubs or organizations: Not on file    Relationship status: Not on file  . Intimate partner violence    Fear of current or ex partner: Not on file    Emotionally abused: Not on file    Physically abused: Not on file    Forced sexual activity: Not on file  Other Topics Concern  . Not on file  Social History Narrative   The patient is separated he has 1 son, he is a Academic librarian.   Quit smoking 2015, no alcohol no caffeine no tobacco  or drug use   No health insurance  :  Review of Systems: A comprehensive 14 point ROS was negative except as noted in the HPI.  Exam: Patient Vitals for the past 24 hrs:  BP Temp Temp src Pulse Resp SpO2  10/14/18 0526 - - - (!) 101 - -  10/14/18 0501 127/88 98.9 F (37.2 C) Oral (!) 119 18 94 %  10/13/18 2046 128/82 99.1 F (37.3 C) Oral (!) 112 16 96 %  10/13/18 1528 115/74 97.8 F (36.6 C) Oral (!) 110 16 94 %    General:  well-nourished in no acute distress.   Eyes:  no scleral icterus.   ENT:  There were no oropharyngeal lesions.   Neck was without thyromegaly.   Lymphatics:  Negative cervical, supraclavicular or axillary adenopathy.  Respiratory: lungs were clear bilaterally without wheezing or crackles.  Cardiovascular:  Regular rate and rhythm, S1/S2, without murmur, rub or gallop.  There was no pedal edema.   GI:  abdomen was soft, flat, nontender, nondistended, without organomegaly.  GU: Foley catheter in place.  Muscoloskeletal:  no spinal tenderness of palpation of vertebral spine.   Skin exam was without echymosis, petichae.  Neuro exam was nonfocal.  Patient was alert and oriented.  Attention was good.   Language was appropriate.  Mood was normal without depression.  Speech was not pressured.  Thought content was not tangential.     Lab Results  Component Value Date   WBC 9.2 10/14/2018   HGB 9.3 (L) 10/14/2018   HCT 32.2 (L) 10/14/2018   PLT 324 10/14/2018   GLUCOSE 130 (H) 10/14/2018    ALT 15 06/22/2018   AST 13 06/22/2018   NA 144 10/14/2018   K 4.5 10/14/2018   CL 109 10/14/2018   CREATININE 2.00 (H) 10/14/2018   BUN 39 (H) 10/14/2018   CO2 28 10/14/2018    Dg C-arm 1-60 Min-no Report  Result Date: 10/12/2018 Fluoroscopy was utilized by the requesting physician.  No radiographic interpretation.   Ct Renal Stone Study  Result Date: 10/12/2018 CLINICAL DATA:  Urinary retention EXAM: CT ABDOMEN AND PELVIS WITHOUT CONTRAST TECHNIQUE: Multidetector CT imaging of the abdomen and pelvis was performed following the standard protocol without IV contrast. COMPARISON:  July 29, 2016 FINDINGS: Lower chest: There is a partially visualized ground-glass airspace opacity in the right lower lobe measuring approximately 1.4 cm (axial series 4, image 1). There is atelectasis at the lung bases, right worse than left.)The heart size is normal. Hepatobiliary: The liver is normal. Cholelithiasis without acute inflammation.There is no biliary ductal dilation. Pancreas: Normal contours without ductal dilatation. No peripancreatic fluid collection. Spleen: No splenic laceration or hematoma. Adrenals/Urinary Tract: --Adrenal glands: No adrenal hemorrhage. --Right kidney/ureter: There is moderate to severe right-sided hydronephrosis without evidence of an obstructing stone. There is an abrupt change in caliber of the distal right ureter proximal to the right UVJ. --Left kidney/ureter: There is moderate severe left-sided hydronephrosis without evidence of an obstructing stone. --Urinary bladder: There is diffuse asymmetric urinary bladder wall thickening. There is adjacent fat stranding and free fluid. Stomach/Bowel: --Stomach/Duodenum: No hiatal hernia or other gastric abnormality. Normal duodenal course and caliber. --Small bowel: No dilatation or inflammation. --Colon: Rectosigmoid diverticulosis without acute inflammation. --Appendix: Normal. Vascular/Lymphatic: Normal course and caliber of the major  abdominal vessels. --there are multiple pathologically enlarged retroperitoneal lymph nodes. --No mesenteric lymphadenopathy. --there are pathologically enlarged pelvic and inguinal lymph nodes. For example there is a 1.6 cm enlarged left pelvic  sidewall lymph node. Reproductive: The prostate gland is mildly enlarged measuring approximately 4.2 by 3.8 cm. Other: There is a trace amount of free fluid in the pelvis. The abdominal wall is normal. Musculoskeletal. There are extensive sclerotic osseous lesions throughout essentially all of the visualized osseous structures. There is no pathologic fracture identified at this time. Large sclerotic lesions are noted involving both proximal femurs. IMPRESSION: 1. Overall findings highly suspicious for metastatic bladder cancer. There is moderate bilateral hydronephrosis. On the left, the level of obstruction is at the urinary bladder. On the right, the level of obstruction is proximal to the urinary bladder in the distal right ureter, raising suspicion for an ascending transitional cell carcinoma. There is diffuse asymmetric bladder wall thickening, greatest along the right lateral bladder wall. In addition, there are pathologically enlarged pelvic, inguinal, and retroperitoneal lymph nodes. 2. Diffuse osseous sclerotic lesions involving virtually all of the visualized osseous structures is highly concerning for osseous metastatic disease. This is new since CT in 2018. 3. Partially visualized ground-glass airspace opacity in the right lower lobe as detailed above. This could represent a metastatic focus within the thorax. Consider a nonemergent follow-up CT of the chest for further evaluation. 4. Cholelithiasis without CT evidence for acute cholecystitis. 5. The prostate gland is only mildly enlarged. Electronically Signed   By: Constance Holster M.D.   On: 10/12/2018 03:28    Dg C-arm 1-60 Min-no Report  Result Date: 10/12/2018 Fluoroscopy was utilized by the  requesting physician.  No radiographic interpretation.   Ct Renal Stone Study  Result Date: 10/12/2018 CLINICAL DATA:  Urinary retention EXAM: CT ABDOMEN AND PELVIS WITHOUT CONTRAST TECHNIQUE: Multidetector CT imaging of the abdomen and pelvis was performed following the standard protocol without IV contrast. COMPARISON:  July 29, 2016 FINDINGS: Lower chest: There is a partially visualized ground-glass airspace opacity in the right lower lobe measuring approximately 1.4 cm (axial series 4, image 1). There is atelectasis at the lung bases, right worse than left.)The heart size is normal. Hepatobiliary: The liver is normal. Cholelithiasis without acute inflammation.There is no biliary ductal dilation. Pancreas: Normal contours without ductal dilatation. No peripancreatic fluid collection. Spleen: No splenic laceration or hematoma. Adrenals/Urinary Tract: --Adrenal glands: No adrenal hemorrhage. --Right kidney/ureter: There is moderate to severe right-sided hydronephrosis without evidence of an obstructing stone. There is an abrupt change in caliber of the distal right ureter proximal to the right UVJ. --Left kidney/ureter: There is moderate severe left-sided hydronephrosis without evidence of an obstructing stone. --Urinary bladder: There is diffuse asymmetric urinary bladder wall thickening. There is adjacent fat stranding and free fluid. Stomach/Bowel: --Stomach/Duodenum: No hiatal hernia or other gastric abnormality. Normal duodenal course and caliber. --Small bowel: No dilatation or inflammation. --Colon: Rectosigmoid diverticulosis without acute inflammation. --Appendix: Normal. Vascular/Lymphatic: Normal course and caliber of the major abdominal vessels. --there are multiple pathologically enlarged retroperitoneal lymph nodes. --No mesenteric lymphadenopathy. --there are pathologically enlarged pelvic and inguinal lymph nodes. For example there is a 1.6 cm enlarged left pelvic sidewall lymph node.  Reproductive: The prostate gland is mildly enlarged measuring approximately 4.2 by 3.8 cm. Other: There is a trace amount of free fluid in the pelvis. The abdominal wall is normal. Musculoskeletal. There are extensive sclerotic osseous lesions throughout essentially all of the visualized osseous structures. There is no pathologic fracture identified at this time. Large sclerotic lesions are noted involving both proximal femurs. IMPRESSION: 1. Overall findings highly suspicious for metastatic bladder cancer. There is moderate bilateral hydronephrosis. On the left, the  level of obstruction is at the urinary bladder. On the right, the level of obstruction is proximal to the urinary bladder in the distal right ureter, raising suspicion for an ascending transitional cell carcinoma. There is diffuse asymmetric bladder wall thickening, greatest along the right lateral bladder wall. In addition, there are pathologically enlarged pelvic, inguinal, and retroperitoneal lymph nodes. 2. Diffuse osseous sclerotic lesions involving virtually all of the visualized osseous structures is highly concerning for osseous metastatic disease. This is new since CT in 2018. 3. Partially visualized ground-glass airspace opacity in the right lower lobe as detailed above. This could represent a metastatic focus within the thorax. Consider a nonemergent follow-up CT of the chest for further evaluation. 4. Cholelithiasis without CT evidence for acute cholecystitis. 5. The prostate gland is only mildly enlarged. Electronically Signed   By: Constance Holster M.D.   On: 10/12/2018 03:28    Assessment and Plan:  This is a 58 year old male with:  1. Metastatic prostate cancer: Gleason score is 9. 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 He has received definitive therapy with androgen deprivation with bilateral orchiectomy. The patient may need additional therapy in the future with Taxotere, Zytiga, Xtandi, or other  agents. These can be added later and are not indicated at this time.   2. Acute urinary retention: Due to metastatic prostate cancer. Will discharge home with Foley catheter in place. Will have outpatient Urology follow up for management of urinary retention and Foley catheter.    3. Acute on chronic chronic kidney disease: Nephrology is following. Creatinine remains elevated compare to baseline (~1.4). Avoid nephrotoxic medication such as ACE/ARBs and NSAIDS. May need nephrostomy tube in the future.   4. Anemia: Due to CKD and underlying malignancy. Hemoglobin remains stable. No transfusion is indicated. Will monitor as an outpatient.   5. Psychosocial: Patient is uninsured. SW has been consulted. Will arrange for ongoing outpatient SW consult at the [facility].    Thank you for this referral.   Mikey Bussing, DNP, AGPCNP-BC, AOCNP  Patient seen and examined personally.  Please see note above as well as my addendum done separately.  You will be contacted with a follow-up appointment upon his discharge.   Zola Button MD

## 2018-10-14 NOTE — Progress Notes (Signed)
[facility] ROUNDING NOTE   Subjective:   This is a 58 year old gentleman with metastatic prostate cancer bilateral hydronephrosis urinary retention PSA 410.  Status post prostate biopsy with Gleason score of 9.  Foley catheter placement bilateral orchiectomy performed on 10/12/2018.  He has good urine output.  Urine output 4.9 L 10/13/2018 weight 9 9.9 kg 10/12/2018  Blood pressure 127/88 pulse of 101 temperature 98.9 O2 sats 94% room air  Sodium 144 potassium 4.5 chloride 109 CO2 28 BUN 39 creatinine 2.0 glucose 130 calcium 8.7 phosphorus 3.7 albumin 2.5 WBC 9.2 hemoglobin 9.3 platelets 324  Medications Maxipime 2 g every 12 hours, Proscar 5 mg daily vitamins 1 daily Flomax 0.4 mg daily  Objective:  Vital signs in last 24 hours:  Temp:  [97.8 F (36.6 C)-99.1 F (37.3 C)] 98.9 F (37.2 C) (07/01 0501) Pulse Rate:  [101-119] 101 (07/01 0526) Resp:  [16-18] 18 (07/01 0501) BP: (115-128)/(74-88) 127/88 (07/01 0501) SpO2:  [94 %-96 %] 94 % (07/01 0501)  Weight change:  Filed Weights   10/12/18 0637  Weight: 99.9 kg    Intake/Output: I/O last 3 completed shifts: In: 5899.2 [P.O.:1560; I.V.:4139.2; IV Piggyback:200] Out: 6875 [Urine:6875]   Intake/Output this shift:  Total I/O In: 240 [P.O.:240] Out: 1775 [Urine:1775]  CVS- RRR no rubs or gallops RS- CTA no wheezes or rales ABD- BS present soft non-distended EXT- no edema   Basic Metabolic Panel: Recent Labs  Lab 10/11/18 2250 10/12/18 0448 10/12/18 0922 10/12/18 1238 10/13/18 0437 10/13/18 1455 10/14/18 0416  NA 141 143  --   --  145  --  144  K 5.9* 5.7* 5.7* 5.6* 5.5* 4.7 4.5  CL 115* 115*  --   --  115*  --  109  CO2 16* 20*  --   --  21*  --  28  GLUCOSE 134* 122*  --   --  101*  --  130*  BUN 47* 46*  --   --  37*  --  39*  CREATININE 2.06* 2.03*  --   --  1.96*  --  2.00*  CALCIUM 9.1 9.1  --   --  8.1*  --  8.1*  MG  --   --   --   --  2.2  --   --   PHOS  --   --   --   --  4.4  --   3.7    Liver Function Tests: Recent Labs  Lab 10/13/18 0437 10/14/18 0416  ALBUMIN 2.5* 2.5*   No results for input(s): LIPASE, AMYLASE in the last 168 hours. No results for input(s): AMMONIA in the last 168 hours.  CBC: Recent Labs  Lab 10/11/18 2250 10/13/18 0437 10/14/18 0416  WBC 11.3* 9.8 9.2  NEUTROABS 7.4  --  6.1  HGB 11.2* 9.4* 9.3*  HCT 38.8* 32.5* 32.2*  MCV 87.8 88.6 89.0  PLT 416* 310 324    Cardiac Enzymes: No results for input(s): CKTOTAL, CKMB, CKMBINDEX, TROPONINI in the last 168 hours.  BNP: Invalid input(s): POCBNP  CBG: Recent Labs  Lab 10/13/18 0729 10/13/18 1214 10/13/18 1709 10/13/18 2054 10/14/18 0746  GLUCAP 80 86 102* 103* 23    Microbiology: Results for orders placed or performed during the hospital encounter of 10/11/18  Urine culture     Status: None (Preliminary result)   Collection Time: 10/11/18 11:23 PM   Specimen: Urine, Random  Result Value Ref Range Status   Specimen Description  Final    URINE, RANDOM Performed at [facility], [place] [address], [place], [place] [address]    Special Requests   Final    NONE Performed at [facility], [place] [address] [place], [place] [address]    Culture   Final    CULTURE REINCUBATED FOR BETTER GROWTH Performed at [facility], [place] [address], [place], [place] [address]    Report Status PENDING  Incomplete  Blood Culture (routine x 2)     Status: None (Preliminary result)   Collection Time: 10/11/18 11:23 PM   Specimen: BLOOD LEFT FOREARM  Result Value Ref Range Status   Specimen Description   Final    BLOOD LEFT FOREARM Performed at [facility], [place] [address] [place], [place] [address]    Special Requests   Final    BOTTLES DRAWN AEROBIC AND ANAEROBIC Blood Culture results may not be optimal due to an excessive volume of blood received in culture bottles Performed at [place] [address]  [place], [place] [address]    Culture   Final    NO GROWTH 2 DAYS Performed at [place] [address] [place], [place] [address]    Report Status PENDING  Incomplete  Blood Culture (routine x 2)     Status: None (Preliminary result)   Collection Time: 10/12/18 12:41 AM   Specimen: BLOOD LEFT HAND  Result Value Ref Range Status   Specimen Description   Final    BLOOD LEFT HAND Performed at [facility], [place] [address] [place], [place] [address]    Special Requests   Final    BOTTLES DRAWN AEROBIC AND ANAEROBIC Blood Culture adequate volume Performed at [place] [address], [place], [place] [address]    Culture   Final    NO GROWTH 2 DAYS Performed at [place] [address] [place], [place] [address]    Report Status PENDING  Incomplete  Novel Coronavirus,NAA,(SEND-OUT TO REF LAB - TAT 24-48 hrs); Hosp Order     Status: None   Collection Time: 10/12/18  3:21 AM   Specimen: Nasopharyngeal Swab; Respiratory  Result Value Ref Range Status   SARS-CoV-2, NAA NOT DETECTED NOT DETECTED Final    Comment: (NOTE) This test was developed and its performance characteristics determined by [employer]. This test has not been FDA cleared or approved. This test has been authorized by FDA under an Emergency Use Authorization (EUA). This test is only authorized for the duration of time the declaration that circumstances exist justifying the authorization of the emergency use of in vitro diagnostic tests for detection of SARS-CoV-2 virus and/or diagnosis of COVID-19 infection under section 564(b)(1) of the Act, 21 U.S.C. 834HDQ-2(I)(2), unless the authorization is terminated or revoked sooner. When diagnostic testing is negative, the possibility of a false negative result should be considered in the context of a patient's recent exposures and the presence of clinical signs and symptoms consistent with COVID-19. An individual without  symptoms of COVID-19 and who is not shedding SARS-CoV-2 virus would expect to have a negative (not detected) result in this assay. Performed  At: [facility] [place], [place] [id number] Rush Farmer MD ER:[id number]    [place]  Final    Comment: Performed at [place] [address], [place], [place] [address]  SARS Coronavirus 2 (CEPHEID - Performed in [facility]  hospital lab), Hosp Order     Status: None   Collection Time: 10/12/18  9:55 AM   Specimen: Nasopharyngeal Swab  Result Value Ref Range Status   SARS Coronavirus 2 NEGATIVE NEGATIVE Final    Comment: (NOTE) If result is NEGATIVE SARS-CoV-2 target nucleic acids are NOT DETECTED. The SARS-CoV-2 RNA is generally detectable in upper and lower  respiratory specimens during the acute phase of infection. The lowest  concentration of SARS-CoV-2 viral copies this assay can detect is 250  copies / mL. A negative result does not preclude SARS-CoV-2 infection  and should not be used as the sole basis for treatment or other  patient management decisions.  A negative result may occur with  improper specimen collection / handling, submission of specimen other  than nasopharyngeal swab, presence of viral mutation(s) within the  areas targeted by this assay, and inadequate number of viral copies  (<250 copies / mL). A negative result must be combined with clinical  observations, patient history, and epidemiological information. If result is POSITIVE SARS-CoV-2 target nucleic acids are DETECTED. The SARS-CoV-2 RNA is generally detectable in upper and lower  respiratory specimens dur ing the acute phase of infection.  Positive  results are indicative of active infection with SARS-CoV-2.  Clinical  correlation with patient history and other diagnostic information is  necessary to determine patient infection status.  Positive results do  not rule out bacterial  infection or co-infection with other viruses. If result is PRESUMPTIVE POSTIVE SARS-CoV-2 nucleic acids MAY BE PRESENT.   A presumptive positive result was obtained on the submitted specimen  and confirmed on repeat testing.  While 2019 novel coronavirus  (SARS-CoV-2) nucleic acids may be present in the submitted sample  additional confirmatory testing may be necessary for epidemiological  and / or clinical management purposes  to differentiate between  SARS-CoV-2 and other Sarbecovirus currently known to infect humans.  If clinically indicated additional testing with an alternate test  methodology [phone]) is advised. The SARS-CoV-2 RNA is generally  detectable in upper and lower respiratory sp ecimens during the acute  phase of infection. The expected result is Negative. Fact Sheet for Patients:  StrictlyIdeas.no Fact Sheet for Healthcare Providers: BankingDealers.co.za This test is not yet approved or cleared by the [place] FDA and has been authorized for detection and/or diagnosis of SARS-CoV-2 by FDA under an Emergency Use Authorization (EUA).  This EUA will remain in effect (meaning this test can be used) for the duration of the COVID-19 declaration under Section 564(b)(1) of the Act, 21 U.S.C. section 360bbb-3(b)(1), unless the authorization is terminated or revoked sooner. Performed at [facility], [place] [address] [place], [place] [address]   MRSA PCR Screening     Status: None   Collection Time: 10/12/18 11:13 AM   Specimen: Nasal Mucosa; Nasopharyngeal  Result Value Ref Range Status   MRSA by PCR NEGATIVE NEGATIVE Final    Comment:        The GeneXpert MRSA Assay (FDA approved for NASAL specimens only), is one component of a comprehensive MRSA colonization surveillance program. It is not intended to diagnose MRSA infection nor to guide or monitor treatment for MRSA infections. Performed at  [facility], [place] [address] [place], [place] [address]     Coagulation Studies: No results for input(s): LABPROT, INR in the last 72 hours.  Urinalysis: Recent Labs    10/11/18 2255  COLORURINE YELLOW  LABSPEC 1.011  PHURINE 5.0  GLUCOSEU NEGATIVE  HGBUR MODERATE*  BILIRUBINUR NEGATIVE  KETONESUR NEGATIVE  PROTEINUR 100*  NITRITE NEGATIVE  LEUKOCYTESUR TRACE*      Imaging: Dg C-arm 1-60 Min-no Report  Result Date: 10/12/2018 Fluoroscopy was utilized by the requesting physician.  No radiographic interpretation.     Medications:   . ceFEPime (MAXIPIME) IV 2 g (10/14/18 0331)  .  sodium bicarbonate (isotonic) infusion in sterile water 150 mL/hr at 10/14/18 0402   . cyanocobalamin  1,000 mcg Subcutaneous Daily  . feeding supplement (ENSURE ENLIVE)  237 mL Oral Q24H  . feeding supplement (PRO-STAT SUGAR FREE 64)  30 mL Oral TID  . finasteride  5 mg Oral Daily  . insulin aspart  0-9 Units Subcutaneous TID WC  . multivitamin with minerals  1 tablet Oral Daily  . sodium zirconium cyclosilicate  10 g Oral TID  . tamsulosin  0.4 mg Oral QPC supper   acetaminophen **OR** acetaminophen, HYDROcodone-acetaminophen, ondansetron **OR** ondansetron (ZOFRAN) IV  Assessment/ Plan:   Acute on chronic kidney disease baseline serum creatinine appears to be about 1.4.  Urinalysis greater than 50 RBCs greater than 50 WBCs 0-5 squamous epithelial cells 100 mg/dL protein.  CT scan of the abdomen is highly suspicious for metastatic bladder cancer with moderate bilateral hydronephrosis diffuse asymmetric bladder wall thickening right lateral bladder wall and large pelvic inguinal retroperitoneal nodes diffuse osseous sclerotic lesions partially visualized groundglass airspace opacity in the right lower lobe cholelithiasis without evidence of cholecystitis and a mildly enlarged prostate gland.  He has a Foley catheter in place.  Continue to avoid nephrotoxic agents ACE  inhibitor's ARB's metformin nonsteroidal anti-inflammatories Cox 2 inhibitors IV contrast and intra-arterial procedures.  Will schedule patient in the office for labs in 1 week.  He will need a provider appointment in 2 to 3 weeks  Hypertension/volume improved  Hyperkalemia we will continue on low potassium diet have given patient advised to avoid citrus fruits and tomatoes.  He will continue on his diet.  Will discontinue Lokelma labs in 1 week  Anemia appears to be stable will avoid the use of ESA's in the setting of metastatic cancer  Metastatic prostate cancer status post cystoscopy appreciate assistance of Dr. Gloriann Loan status post orchiectomy 10/12/2018  Diabetes mellitus agree with discontinuation of metformin.  Acute urinary retention secondary to prostate cancer Foley catheter in place follow-up with urology    LOS: 2 Sherril Croon @TODAY @11 :18 AM

## 2018-10-14 NOTE — Progress Notes (Signed)
[facility] program explained and given to pt. Pt understood this is only once in one year from today until 2021 use.

## 2018-10-14 NOTE — Discharge Instructions (Signed)

## 2018-10-14 NOTE — Consult Note (Addendum)
Physician requesting consult: Jennette Kettle, DO  Chief Complaint: Metastatic prostate cancer, bilateral hydronephrosis  History of Present Illness: 58 year old male with a history of urinary retention and bilateral hydronephrosis.  Obtain a PSA on the patient that revealed a PSA of 410.  He subsequent underwent a prostate biopsy that revealed prostate cancer in all 12 cores.  Maximum Gleason score was 9.  Patient does not have insurance and I have been trying to get him a metastatic work-up followed by scheduling for either androgen deprivation therapy or bilateral orchiectomy.    Presented to the emergency department at 6/28.  Hyperkalemia and creatinine of 2. Creatinine is essentially unchanged from prior values.  He underwent a CT scan which revealed bilateral hydronephrosis and a thick-walled bladder.  There was also metastatic lymphadenopathy and sclerotic lesions in the bone consistent with bone metastasis.  Patient mainly came in with complaints of increasing urinary complaints.  Urinalysis showed trace leukocytes negative nitrite, rare bacteria, greater than 50 WBCs.  He does have history urinary retention.  I had originally planned for a channel TURP to try to get him out of retention and hopefully help the hydronephrosis.  He had previously refused a Foley catheter.  Status post urethral dilation, Foley catheter placement, bilateral orchiectomy on 6/29.  Unable to identify ureteral orifices and no ureteral stents were placed.  >3L of UOP per day since foley placement. Cr has remained stable around 2  Past Medical History:  Diagnosis Date  . Diabetes mellitus, new onset ([place]) 07/2016  . Enlarged prostate   . HLD (hyperlipidemia)    Past Surgical History:  Procedure Laterality Date  . KNEE ARTHROSCOPY Right   . ORCHIECTOMY Bilateral 10/12/2018   Procedure: ORCHIECTOMY;  Surgeon: Lucas Mallow, MD;  Location: [facility];  Service: Urology;  Laterality: Bilateral;  . TRANSURETHRAL  RESECTION OF PROSTATE  10/12/2018   Procedure: CYSTOSCOPY WITH URETHRAL DILATION;  Surgeon: Lucas Mallow, MD;  Location: [facility];  Service: Urology;;    Home Medications:  Medications Prior to Admission  Medication Sig Dispense Refill Last Dose  . Blood Glucose Monitoring Suppl (RELION CONFIRM GLUCOSE MONITOR) w/Device KIT Use 4 times daily before meals and bedtime as directed. 1 kit 0   . finasteride (PROSCAR) 5 MG tablet Take 5 mg by mouth daily.   10/11/2018 at Unknown time  . glucose blood (RELION GLUCOSE TEST STRIPS) test strip Use as instructed 100 each 12   . Lancets 30G MISC Use 4 times daily before meals as directed. 100 each 0   . lisinopril (PRINIVIL,ZESTRIL) 20 MG tablet Take 10 tablets by mouth daily.    10/11/2018 at Unknown time  . tamsulosin (FLOMAX) 0.4 MG CAPS capsule Take 1 capsule (0.4 mg total) by mouth daily after supper. (Patient taking differently: Take 0.8 mg by mouth daily after supper. ) 30 capsule 0 10/11/2018 at Unknown time  . metFORMIN (GLUCOPHAGE-XR) 500 MG 24 hr tablet Take 1 tablet ('500mg'$ ) daily for the next 4 days, then begin taking 1 tablet ('500mg'$ ) two times every day (for a total of '1000mg'$  daily). (Patient not taking: Reported on 10/12/2018) 60 tablet 0 Not Taking at Unknown time   Allergies: No Known Allergies  Family History  Problem Relation Age of Onset  . Prostate cancer Father   . Heart disease Father    Social History:  reports that he quit smoking about 5 years ago. His smoking use included cigarettes. He has never used smokeless tobacco. He reports that he  does not drink alcohol or use drugs.  ROS: A complete review of systems was performed.  All systems are negative except for pertinent findings as noted. ROS   Physical Exam:  Vital signs in last 24 hours: Temp:  [97.8 F (36.6 C)-99.1 F (37.3 C)] 98.9 F (37.2 C) (07/01 0501) Pulse Rate:  [101-119] 101 (07/01 0526) Resp:  [16-18] 18 (07/01 0501) BP: (115-128)/(74-88) 127/88 (07/01  0501) SpO2:  [94 %-96 %] 94 % (07/01 0501) General:  Alert and oriented, No acute distress HEENT: Normocephalic, atraumatic Neck: No JVD or lymphadenopathy Cardiovascular: Regular rate and rhythm Lungs: Regular rate and effort Abdomen: Soft, nontender, nondistended, no abdominal masses GU: 18 French Foley draining light yellow urine.  Midline scrotal incision clean dry and intact.  No scrotal hematoma Back: No CVA tenderness Extremities: No edema Neurologic: Grossly intact  Laboratory Data:  Results for orders placed or performed during the hospital encounter of 10/11/18 (from the past 24 hour(s))  Glucose, capillary     Status: None   Collection Time: 10/13/18 12:14 PM  Result Value Ref Range   Glucose-Capillary 86 70 - 99 mg/dL  Potassium     Status: None   Collection Time: 10/13/18  2:55 PM  Result Value Ref Range   Potassium 4.7 3.5 - 5.1 mmol/L  Glucose, capillary     Status: Abnormal   Collection Time: 10/13/18  5:09 PM  Result Value Ref Range   Glucose-Capillary 102 (H) 70 - 99 mg/dL  Glucose, capillary     Status: Abnormal   Collection Time: 10/13/18  8:54 PM  Result Value Ref Range   Glucose-Capillary 103 (H) 70 - 99 mg/dL  Renal function panel     Status: Abnormal   Collection Time: 10/14/18  4:16 AM  Result Value Ref Range   Sodium 144 135 - 145 mmol/L   Potassium 4.5 3.5 - 5.1 mmol/L   Chloride 109 98 - 111 mmol/L   CO2 28 22 - 32 mmol/L   Glucose, Bld 130 (H) 70 - 99 mg/dL   BUN 39 (H) 6 - 20 mg/dL   Creatinine, Ser 2.00 (H) 0.61 - 1.24 mg/dL   Calcium 8.1 (L) 8.9 - 10.3 mg/dL   Phosphorus 3.7 2.5 - 4.6 mg/dL   Albumin 2.5 (L) 3.5 - 5.0 g/dL   GFR calc non Af Amer 36 (L) >60 mL/min   GFR calc Af Amer 42 (L) >60 mL/min   Anion gap 7 5 - 15  CBC with Differential/Platelet     Status: Abnormal   Collection Time: 10/14/18  4:16 AM  Result Value Ref Range   WBC 9.2 4.0 - 10.5 K/uL   RBC 3.62 (L) 4.22 - 5.81 MIL/uL   Hemoglobin 9.3 (L) 13.0 - 17.0 g/dL    HCT 32.2 (L) 39.0 - 52.0 %   MCV 89.0 80.0 - 100.0 fL   MCH 25.7 (L) 26.0 - 34.0 pg   MCHC 28.9 (L) 30.0 - 36.0 g/dL   RDW 15.9 (H) 11.5 - 15.5 %   Platelets 324 150 - 400 K/uL   nRBC 0.0 0.0 - 0.2 %   Neutrophils Relative % 67 %   Neutro Abs 6.1 1.7 - 7.7 K/uL   Lymphocytes Relative 22 %   Lymphs Abs 2.0 0.7 - 4.0 K/uL   Monocytes Relative 7 %   Monocytes Absolute 0.7 0.1 - 1.0 K/uL   Eosinophils Relative 2 %   Eosinophils Absolute 0.2 0.0 - 0.5 K/uL   Basophils Relative  0 %   Basophils Absolute 0.0 0.0 - 0.1 K/uL   Immature Granulocytes 2 %   Abs Immature Granulocytes 0.22 (H) 0.00 - 0.07 K/uL  Glucose, capillary     Status: None   Collection Time: 10/14/18  7:46 AM  Result Value Ref Range   Glucose-Capillary 87 70 - 99 mg/dL    Creatinine: Recent Labs    10/11/18 2250 10/12/18 0448 10/13/18 0437 10/14/18 0416  CREATININE 2.06* 2.03* 1.96* 2.00*   CT scan personally reviewed and is detailed in the history of present illness  Impression/Assessment:  Metastatic prostate cancer Metastatic lymphadenopathy secondary to the above 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  If positive recommend treating for 1 week   - Given stable creatinine, would hold off on nephrostomy tube placement at this time.  Level of obstruction may have been his urethral stricture which is now alleviated with Foley catheter placement  -Okay to discharge from urology perspective with Foley catheter in place.  Please provide Foley care teaching  -We will schedule urology follow-up for 1 week with renal ultrasound and possible Foley removal  -Still needs medical oncology follow-up.  If not seen as an inpatient this will need to be followed up as an outpatient (for second treatment was orchiectomy but  would need further medical therapy as an outpatient)  -Plan communicated with hospitalist team this morning  Please obtain social work consultation and see if he can be assisted in obtaining insurance.  This is very important given his new diagnosis and his need for ongoing care of his metastatic prostate cancer.  Tharon Aquas 10/14/2018, 8:32 AM

## 2018-10-14 NOTE — Discharge Summary (Signed)
Physician Discharge Summary  JDEN WANT JKK:[id number] DOB: Jul 11, 1960  PCP: [name], MD  Admit date: 10/11/2018 Discharge date: 10/14/2018  Recommendations for Outpatient Follow-up:  1. Dr. Claris Gower, PCP in 1 week.  Please follow final urine and blood culture results that were sent from the hospital. 2. Dr. Edrick Oh, Nephrology: MDs office will arrange repeat labs (CBC & BMP) in 1 week and MD follow-up in 2-3 weeks. 3. Dr. Zola Button, Oncology: MDs office will arrange follow-up in 3-4 weeks. 4. Dr. Link Snuffer, [facility]: MDs office will arrange follow-up in 1 week.  Home Health: None Equipment/Devices: Patient will DC home with indwelling Foley catheter until close outpatient follow-up with urology.  Discharge Condition: Improved and stable CODE STATUS: Full Diet recommendation: Heart healthy & diabetic diet.  Discharge Diagnoses:  Principal Problem:   Prostate cancer metastatic to multiple sites [facility]) Active Problems:   Urinary retention   Hydronephrosis   DM2 (diabetes mellitus, type 2) (HCC)   Dysuria   CKD (chronic kidney disease) stage 3, GFR 30-59 ml/min (HCC)   Hyperkalemia   Metabolic acidosis, normal anion gap (NAG)   Acute renal failure superimposed on stage 3 chronic kidney disease (HCC)   Vitamin B12 deficiency   Acute cystitis with hematuria   Acute hyperkalemia   Brief Summary: 58 year old male with PMH of diet-controlled DM 2, HTN, HLD, urinary retention and bilateral hydronephrosis, had elevated PSA of 410 as outpatient for which he underwent prostate biopsy that revealed prostate cancer in all 12 cores with maximum Gleason score of 9, urology were attempting to coordinate metastatic work-up as outpatient, patient now presented to ED due to acute urinary retention, hyperkalemia and acute on chronic kidney disease.  CT abdomen revealed bilateral hydronephrosis and thick-walled bladder, metastatic lymphadenopathy and sclerotic  lesions in the bone consistent with bony metastasis.  He had significant urinary symptoms i.e. frequency, urgency, inadequate urine output.  Urology, nephrology and medical oncology were consulted.  Assessment and plan:  1 metastatic prostate cancer/metastatic lymphadenopathy/bone mets Patient had presented with urinary retention.  Work-up including CT stone protocol worrisome for metastatic prostate cancer. Patient subsequently underwent bilateral simple scrotal orchiectomy, cystoscopy with urethral dilatation.  Stents were unable to be placed during cystoscopy with urethral dilatation as bilateral ureteral orifices were unable to be located.  Status post Foley catheter placement with good urine output.  Urology follow-up appreciated.  I discussed with urology.  They recommend holding off on nephrostomy tube placement due to stable and baseline creatinine.  They suspect level of obstruction may have been his urethral stricture which is now alleviated with Foley catheter placement.  They recommend discharging home with Foley catheter in place and they will see him in the office in 1 week for repeat renal ultrasound and possible Foley removal.  Urine culture results are still pending and patient will be discharged home on oral Omnicef to complete total 7 days course.  Oncology input appreciated and they will arrange follow-up in 3 to 4 weeks to discuss additional therapy.  Pain management: Patient reports that he still has hydrocodone-acetaminophen 5-325 mg supply at home and his mother took them away from him because he was not taking them in the correct way.  Reviewed [facility] controlled substance database and 40 tablets were prescribed by his PCP on 10/01/2018.  He was advised to take his pain medications appropriately and follow-up with PCP regarding further needs.  He verbalized understanding.  2.  Bilateral hydronephrosis/acute urinary retention Secondary to  problem #1.  No significant change  with renal function postoperatively and with placement of Foley catheter.  Foley catheter with good drainage.    Management as noted above.  3.  Acute on chronic kidney disease stage III Baseline creatinine approximately 1.4.  Likely secondary to a post renal azotemia secondary to problems #1 & 2.  CT abdomen was highly suspicious for metastatic bladder cancer with moderate bilateral hydronephrosis, diffuse asymmetric bladder wall thickening with right lateral bladder wall and large pelvic inguinal retroperitoneal nodes, diffuse osteosclerotic lesions partially visualized groundglass opacity in right lower lobe.  Urinalysis which was done with trace leukocytes, nitrite negative, greater than 50 WBCs, greater than 50 RBCs.  Urine potassium was 16, urine sodium was 44, urine creatinine of 38.31.  Foley catheter was placed per urology perioperatively.  Patient's ACE inhibitor has been discontinued.  Avoid nephrotoxic agents.  Will not resume metformin which she was not taking anyway.  Avoid NSAIDs which has been counseled.  Nephrology follow-up appreciated, I discussed with them.  Creatinine seems to be stable and baseline compared to March 2020.  They will arrange close outpatient follow-up with repeat labs.  Low potassium diet has been counseled to patient by Nephrology and this MD.  4.  Hyperkalemia/metabolic acidosis Likely secondary to problem #1 2 and 3 in the setting of ACE inhibitor.  ACE inhibitor has been discontinued.  Patient been seen by nephrology and patient placed on bicarb drip.  Lokelma has been increased to 10 mg 3 times daily per nephrology.   Patient status post calcium chloride on admission 10/12/2018.  Repeat potassium levels this afternoon.  As per nephrology, Milly Jakob discontinued, low potassium diet counseled and they will arrange close outpatient follow-up with repeat labs.  5.  Diabetes mellitus type 2 Hemoglobin A1c of 6.5.    Patient had not been taking metformin at home and was  controlling on diet alone.    Good inpatient control..  6.  Urinary retention Likely secondary to problems #1 and 2.  Foley catheter placed with good urine output.    Discharge on Foley catheter per urology.  Continue Proscar and Flomax.    7.  Probable UTI Urine cultures pending at time of discharge.  Patient was transitioned from IV cefepime to [facility] to complete total 7 days course.  Follow urine cultures as outpatient.  Discussed with pharmacy..  8.  Anemia of chronic disease/vitamin B12 deficiency. Anemia panel consistent with anemia of chronic disease and vitamin B12 deficiency.  Treated in the hospital with subcutaneous B12 supplements.  At discharge will change to oral B12 supplements.  Follow B12 levels in a couple of weeks and if persistently low then may consider parenteral B12.  9.  Essential hypertension: Controlled off of antihypertensives at this time.  Lisinopril discontinued due to acute on chronic kidney disease.   Consultants:   Urology: Dr. Gloriann Loan 10/12/2018  Nephrology: Dr. Justin Mend 10/13/2018  Procedures:   CT renal stone protocol 10/12/2018  Bilateral simple scrotal orchiectomy/cystoscopy with urethral dilatation per Dr. Gloriann Loan 10/12/2018   Discharge Instructions  Discharge Instructions    Call MD for:  difficulty breathing, headache or visual disturbances   Complete by: As directed    Call MD for:  extreme fatigue   Complete by: As directed    Call MD for:  persistant dizziness or light-headedness   Complete by: As directed    Call MD for:  persistant nausea and vomiting   Complete by: As directed    Call MD for:  redness, tenderness, or signs of infection (pain, swelling, redness, odor or green/yellow discharge around incision site)   Complete by: As directed    Call MD for:  severe uncontrolled pain   Complete by: As directed    Call MD for:  temperature >100.4   Complete by: As directed    Diet - low sodium heart healthy   Complete by: As directed     Diet Carb Modified   Complete by: As directed    Increase activity slowly   Complete by: As directed        Medication List    STOP taking these medications   lisinopril 20 MG tablet Commonly known as: ZESTRIL   metFORMIN 500 MG 24 hr tablet Commonly known as: GLUCOPHAGE-XR     TAKE these medications   cefdinir 300 MG capsule Commonly known as: OMNICEF Take 2 capsules (600 mg total) by mouth daily for 5 days.   feeding supplement (ENSURE ENLIVE) Liqd Take 237 mLs by mouth daily.   feeding supplement (PRO-STAT SUGAR FREE 64) Liqd Take 30 mLs by mouth 3 (three) times daily.   finasteride 5 MG tablet Commonly known as: PROSCAR Take 5 mg by mouth daily.   glucose blood test strip Commonly known as: RELION GLUCOSE TEST STRIPS Use as instructed   Lancets 30G Misc Use 4 times daily before meals as directed.   multivitamin with minerals Tabs tablet Take 1 tablet by mouth daily. Start taking on: October 15, 2018   ReliOn Confirm Glucose Monitor w/Device Kit Use 4 times daily before meals and bedtime as directed.   tamsulosin 0.4 MG Caps capsule Commonly known as: FLOMAX Take 2 capsules (0.8 mg total) by mouth daily after supper.   vitamin B-12 1000 MCG tablet Commonly known as: CYANOCOBALAMIN Take 1 tablet (1,000 mcg total) by mouth daily.      Follow-up Information    [name], MD. Schedule an appointment as soon as possible for a visit in 1 week(s).   Specialty: Family Medicine Contact information: [place] [address] [phone]        Edrick Oh, MD Follow up.   Specialty: Nephrology Why: MDs office will arrange lab work (CBC & BMP) in 1 week and MD follow-up in 2 to 3 weeks. Contact information: [place] [address] [phone]        Lucas Mallow, MD Follow up.   Specialty: Urology Why: MDs office will arrange follow-up in 1 week. Contact information: [place]  [id number] [phone]        Wyatt Portela, MD Follow up.   Specialty: Oncology Why: MDs office will arrange follow-up in 3 to 4 weeks. Contact information: [place] [address] [phone]          No Known Allergies    Procedures/Studies: Dg C-arm 1-60 Min-no Report  Result Date: 10/12/2018 Fluoroscopy was utilized by the requesting physician.  No radiographic interpretation.   Ct Renal Stone Study  Result Date: 10/12/2018 CLINICAL DATA:  Urinary retention EXAM: CT ABDOMEN AND PELVIS WITHOUT CONTRAST TECHNIQUE: Multidetector CT imaging of the abdomen and pelvis was performed following the standard protocol without IV contrast. COMPARISON:  July 29, 2016 FINDINGS: Lower chest: There is a partially visualized ground-glass airspace opacity in the right lower lobe measuring approximately 1.4 cm (axial series 4, image 1). There is atelectasis at the lung bases, right worse than left.)The heart size is normal.  Hepatobiliary: The liver is normal. Cholelithiasis without acute inflammation.There is no biliary ductal dilation. Pancreas: Normal contours without ductal dilatation. No peripancreatic fluid collection. Spleen: No splenic laceration or hematoma. Adrenals/Urinary Tract: --Adrenal glands: No adrenal hemorrhage. --Right kidney/ureter: There is moderate to severe right-sided hydronephrosis without evidence of an obstructing stone. There is an abrupt change in caliber of the distal right ureter proximal to the right UVJ. --Left kidney/ureter: There is moderate severe left-sided hydronephrosis without evidence of an obstructing stone. --Urinary bladder: There is diffuse asymmetric urinary bladder wall thickening. There is adjacent fat stranding and free fluid. Stomach/Bowel: --Stomach/Duodenum: No hiatal hernia or other gastric abnormality. Normal duodenal course and caliber. --Small bowel: No dilatation or inflammation. --Colon: Rectosigmoid diverticulosis  without acute inflammation. --Appendix: Normal. Vascular/Lymphatic: Normal course and caliber of the major abdominal vessels. --there are multiple pathologically enlarged retroperitoneal lymph nodes. --No mesenteric lymphadenopathy. --there are pathologically enlarged pelvic and inguinal lymph nodes. For example there is a 1.6 cm enlarged left pelvic sidewall lymph node. Reproductive: The prostate gland is mildly enlarged measuring approximately 4.2 by 3.8 cm. Other: There is a trace amount of free fluid in the pelvis. The abdominal wall is normal. Musculoskeletal. There are extensive sclerotic osseous lesions throughout essentially all of the visualized osseous structures. There is no pathologic fracture identified at this time. Large sclerotic lesions are noted involving both proximal femurs. IMPRESSION: 1. Overall findings highly suspicious for metastatic bladder cancer. There is moderate bilateral hydronephrosis. On the left, the level of obstruction is at the urinary bladder. On the right, the level of obstruction is proximal to the urinary bladder in the distal right ureter, raising suspicion for an ascending transitional cell carcinoma. There is diffuse asymmetric bladder wall thickening, greatest along the right lateral bladder wall. In addition, there are pathologically enlarged pelvic, inguinal, and retroperitoneal lymph nodes. 2. Diffuse osseous sclerotic lesions involving virtually all of the visualized osseous structures is highly concerning for osseous metastatic disease. This is new since CT in 2018. 3. Partially visualized ground-glass airspace opacity in the right lower lobe as detailed above. This could represent a metastatic focus within the thorax. Consider a nonemergent follow-up CT of the chest for further evaluation. 4. Cholelithiasis without CT evidence for acute cholecystitis. 5. The prostate gland is only mildly enlarged. Electronically Signed   By: Constance Holster M.D.   On: 10/12/2018  03:28      Subjective: Patient reports appropriate postop scrotal area pain, 6/10, intermittent, improves after pain medicines.  Had BM yesterday.  Tolerating diet without nausea, vomiting or abdominal pain.  No bleeding reported.  As per RN, ambulated in the hall without complaints.  Discharge Exam:  Vitals:   10/13/18 1528 10/13/18 2046 10/14/18 0501 10/14/18 0526  BP: 115/74 128/82 127/88   Pulse: (!) 110 (!) 112 (!) 119 (!) 101  Resp: 16 16 18    Temp: 97.8 F (36.6 C) 99.1 F (37.3 C) 98.9 F (37.2 C)   TempSrc: Oral Oral Oral   SpO2: 94% 96% 94%   Weight:      Height:        General: Pleasant young male, moderately built and obese sitting up comfortably in bed without distress.  Just completed eating almost 100% of his breakfast. Cardiovascular: S1 & S2 heard, RRR, S1/S2 +. No murmurs, rubs, gallops or clicks. No JVD or pedal edema.  Telemetry personally reviewed: Sinus tachycardia in the 110s-120s, asymptomatic. Respiratory: Clear to auscultation without wheezing, rhonchi or crackles. No increased work of breathing. Abdominal:  Non distended, non tender & soft. No organomegaly or masses appreciated. Normal bowel sounds heard. GU: Scrotal dressing with minimal blood swelling but no active bleeding.  No other acute findings. CNS: Alert and oriented. No focal deficits. Extremities: no edema, no cyanosis    The results of significant diagnostics from this hospitalization (including imaging, microbiology, ancillary and laboratory) are listed below for reference.     Microbiology: Recent Results (from the past 240 hour(s))  Urine culture     Status: None (Preliminary result)   Collection Time: 10/11/18 11:23 PM   Specimen: Urine, Random  Result Value Ref Range Status   Specimen Description   Final    URINE, RANDOM Performed at [facility] [address] [place], [place] [address]    Special Requests   Final    NONE Performed at [facility], [place] [address], [place], [place] [address]    Culture   Final    CULTURE REINCUBATED FOR BETTER GROWTH Performed at [facility], [place] [address], [place], [place] [address]    Report Status PENDING  Incomplete  Blood Culture (routine x 2)     Status: None (Preliminary result)   Collection Time: 10/11/18 11:23 PM   Specimen: BLOOD LEFT FOREARM  Result Value Ref Range Status   Specimen Description   Final    BLOOD LEFT FOREARM Performed at [facility], [place] [address], [place], [place] [address]    Special Requests   Final    BOTTLES DRAWN AEROBIC AND ANAEROBIC Blood Culture results may not be optimal due to an excessive volume of blood received in culture bottles Performed at [facility] [address] [place], [place] [address]    Culture   Final    NO GROWTH 2 DAYS Performed at [facility] [address], [place], [place] [address]    Report Status PENDING  Incomplete  Blood Culture (routine x 2)     Status: None (Preliminary result)   Collection Time: 10/12/18 12:41 AM   Specimen: BLOOD LEFT HAND  Result Value Ref Range Status   Specimen Description   Final    BLOOD LEFT HAND Performed at [facility], [place] [address], [place], [place] [address]    Special Requests   Final    BOTTLES DRAWN AEROBIC AND ANAEROBIC Blood Culture adequate volume Performed at [facility] [address], [place], [place] [address]    Culture   Final    NO GROWTH 2 DAYS Performed at [facility] [address] [place], [place] [address]    Report Status PENDING  Incomplete  Novel Coronavirus,NAA,(SEND-OUT TO REF LAB - TAT 24-48 hrs); Hosp Order     Status: None   Collection Time: 10/12/18  3:21 AM   Specimen: Nasopharyngeal Swab; Respiratory  Result Value Ref Range Status   SARS-CoV-2, NAA NOT DETECTED NOT DETECTED Final    Comment: (NOTE) This test was developed and its performance  characteristics determined by [employer]. This test has not been FDA cleared or approved. This test has been authorized by FDA under an Emergency Use Authorization (EUA). This test is only authorized for the duration of time the declaration that circumstances exist justifying the authorization of the emergency use of in vitro diagnostic tests for detection of SARS-CoV-2 virus and/or diagnosis of COVID-19 infection under section 564(b)(1) of the Act, 21 U.S.C. 035DHR-4(B)(6), unless the authorization is terminated or  revoked sooner. When diagnostic testing is negative, the possibility of a false negative result should be considered in the context of a patient's recent exposures and the presence of clinical signs and symptoms consistent with COVID-19. An individual without symptoms of COVID-19 and who is not shedding SARS-CoV-2 virus would expect to have a negative (not detected) result in this assay. Performed  At: [facility] [place], [place] [id number] Rush Farmer MD KP:[id number]    [place]  Final    Comment: Performed at [facility] [address], [place], [place] [address]  SARS Coronavirus 2 (CEPHEID - Performed in [facility] hospital lab), Hosp Order     Status: None   Collection Time: 10/12/18  9:55 AM   Specimen: Nasopharyngeal Swab  Result Value Ref Range Status   SARS Coronavirus 2 NEGATIVE NEGATIVE Final    Comment: (NOTE) If result is NEGATIVE SARS-CoV-2 target nucleic acids are NOT DETECTED. The SARS-CoV-2 RNA is generally detectable in upper and lower  respiratory specimens during the acute phase of infection. The lowest  concentration of SARS-CoV-2 viral copies this assay can detect is 250  copies / mL. A negative result does not preclude SARS-CoV-2 infection  and should not be used as the sole basis for treatment or other  patient management decisions.  A negative result may occur  with  improper specimen collection / handling, submission of specimen other  than nasopharyngeal swab, presence of viral mutation(s) within the  areas targeted by this assay, and inadequate number of viral copies  (<250 copies / mL). A negative result must be combined with clinical  observations, patient history, and epidemiological information. If result is POSITIVE SARS-CoV-2 target nucleic acids are DETECTED. The SARS-CoV-2 RNA is generally detectable in upper and lower  respiratory specimens dur ing the acute phase of infection.  Positive  results are indicative of active infection with SARS-CoV-2.  Clinical  correlation with patient history and other diagnostic information is  necessary to determine patient infection status.  Positive results do  not rule out bacterial infection or co-infection with other viruses. If result is PRESUMPTIVE POSTIVE SARS-CoV-2 nucleic acids MAY BE PRESENT.   A presumptive positive result was obtained on the submitted specimen  and confirmed on repeat testing.  While 2019 novel coronavirus  (SARS-CoV-2) nucleic acids may be present in the submitted sample  additional confirmatory testing may be necessary for epidemiological  and / or clinical management purposes  to differentiate between  SARS-CoV-2 and other Sarbecovirus currently known to infect humans.  If clinically indicated additional testing with an alternate test  methodology [phone]) is advised. The SARS-CoV-2 RNA is generally  detectable in upper and lower respiratory sp ecimens during the acute  phase of infection. The expected result is Negative. Fact Sheet for Patients:  StrictlyIdeas.no Fact Sheet for Healthcare Providers: BankingDealers.co.za This test is not yet approved or cleared by the [place] FDA and has been authorized for detection and/or diagnosis of SARS-CoV-2 by FDA under an Emergency Use Authorization (EUA).  This EUA  will remain in effect (meaning this test can be used) for the duration of the COVID-19 declaration under Section 564(b)(1) of the Act, 21 U.S.C. section 360bbb-3(b)(1), unless the authorization is terminated or revoked sooner. Performed at [facility], [place] [address], [place], [place] [address]   MRSA PCR Screening     Status: None   Collection Time: 10/12/18 11:13 AM   Specimen: Nasal Mucosa; Nasopharyngeal  Result  Value Ref Range Status   MRSA by PCR NEGATIVE NEGATIVE Final    Comment:        The GeneXpert MRSA Assay (FDA approved for NASAL specimens only), is one component of a comprehensive MRSA colonization surveillance program. It is not intended to diagnose MRSA infection nor to guide or monitor treatment for MRSA infections. Performed at [facility], [place] [address], [place], [place] [address]      Labs: CBC: Recent Labs  Lab 10/11/18 2250 10/13/18 0437 10/14/18 0416  WBC 11.3* 9.8 9.2  NEUTROABS 7.4  --  6.1  HGB 11.2* 9.4* 9.3*  HCT 38.8* 32.5* 32.2*  MCV 87.8 88.6 89.0  PLT 416* 310 638   Basic Metabolic Panel: Recent Labs  Lab 10/11/18 2250 10/12/18 0448 10/12/18 0922 10/12/18 1238 10/13/18 0437 10/13/18 1455 10/14/18 0416  NA 141 143  --   --  145  --  144  K 5.9* 5.7* 5.7* 5.6* 5.5* 4.7 4.5  CL 115* 115*  --   --  115*  --  109  CO2 16* 20*  --   --  21*  --  28  GLUCOSE 134* 122*  --   --  101*  --  130*  BUN 47* 46*  --   --  37*  --  39*  CREATININE 2.06* 2.03*  --   --  1.96*  --  2.00*  CALCIUM 9.1 9.1  --   --  8.1*  --  8.1*  MG  --   --   --   --  2.2  --   --   PHOS  --   --   --   --  4.4  --  3.7   Liver Function Tests: Recent Labs  Lab 10/13/18 0437 10/14/18 0416  ALBUMIN 2.5* 2.5*   BNP (last 3 results) No results for input(s): BNP in the last 8760 hours. Cardiac Enzymes: No results for input(s): CKTOTAL, CKMB, CKMBINDEX, TROPONINI in the last 168 hours. CBG: Recent Labs  Lab  10/13/18 0729 10/13/18 1214 10/13/18 1709 10/13/18 2054 10/14/18 0746  GLUCAP 80 86 102* 103* 87   Hgb A1c Recent Labs    10/12/18 0448  HGBA1C 6.5*   Anemia work up Recent Labs    10/13/18 0819  VITAMINB12 166*  FOLATE 10.5  FERRITIN 631*  TIBC 179*  IRON 46   Urinalysis    Component Value Date/Time   COLORURINE YELLOW 10/11/2018 2255   APPEARANCEUR CLOUDY (A) 10/11/2018 2255   LABSPEC 1.011 10/11/2018 2255   PHURINE 5.0 10/11/2018 2255   GLUCOSEU NEGATIVE 10/11/2018 2255   HGBUR MODERATE (A) 10/11/2018 2255   BILIRUBINUR NEGATIVE 10/11/2018 2255   KETONESUR NEGATIVE 10/11/2018 2255   PROTEINUR 100 (A) 10/11/2018 2255   NITRITE NEGATIVE 10/11/2018 2255   LEUKOCYTESUR TRACE (A) 10/11/2018 2255      Time coordinating discharge: 40 minutes  SIGNED:  Vernell Leep, MD, FACP, [facility]. [facility]  To contact the attending provider between 7A-7P or the covering provider during after hours 7P-7A, please log into the web site [email or web address] and access using universal [name] password for that web site. If you do not have the password, please call the hospital operator.

## 2018-10-15 LAB — URINE CULTURE: Culture: 40000 — AB

## 2018-10-17 LAB — CULTURE, BLOOD (ROUTINE X 2)
Culture: NO GROWTH
Culture: NO GROWTH
Special Requests: ADEQUATE

## 2018-10-21 ENCOUNTER — Ambulatory Visit (HOSPITAL_COMMUNITY): Payer: Self-pay

## 2018-10-21 ENCOUNTER — Telehealth: Payer: Self-pay | Admitting: Oncology

## 2018-10-21 ENCOUNTER — Ambulatory Visit (HOSPITAL_COMMUNITY): Admission: RE | Admit: 2018-10-21 | Payer: Self-pay | Source: Ambulatory Visit

## 2018-10-21 ENCOUNTER — Encounter (HOSPITAL_COMMUNITY): Payer: Self-pay

## 2018-10-21 NOTE — Telephone Encounter (Signed)
Spoke with patient re new patient appointment with Dr. Alen Blew 10/30/18 at 2 pm to arrive 1:30 pm. Schedule also mailed (BK). Demographic/insurance information confirmed. Patient uninsured.

## 2018-10-30 ENCOUNTER — Inpatient Hospital Stay: Payer: Medicaid Other | Attending: Oncology | Admitting: Oncology

## 2018-10-30 ENCOUNTER — Other Ambulatory Visit: Payer: Self-pay

## 2018-10-30 VITALS — BP 124/71 | HR 115 | Temp 98.9°F | Resp 18 | Ht 69.0 in | Wt 230.5 lb

## 2018-10-30 DIAGNOSIS — C61 Malignant neoplasm of prostate: Secondary | ICD-10-CM

## 2018-10-30 DIAGNOSIS — I10 Essential (primary) hypertension: Secondary | ICD-10-CM | POA: Insufficient documentation

## 2018-10-30 DIAGNOSIS — N179 Acute kidney failure, unspecified: Secondary | ICD-10-CM | POA: Insufficient documentation

## 2018-10-30 DIAGNOSIS — C7951 Secondary malignant neoplasm of bone: Secondary | ICD-10-CM | POA: Insufficient documentation

## 2018-10-30 DIAGNOSIS — R339 Retention of urine, unspecified: Secondary | ICD-10-CM | POA: Diagnosis not present

## 2018-10-30 NOTE — Progress Notes (Signed)
Hematology and Oncology Follow Up Visit  Jeffery [name] [id number] March 04, 1961 58 y.o. 10/30/2018 2:05 PM Jeffery [name], MDElkins, Jeffery [name], *   Principle Diagnosis: 58 year old man with advanced prostate cancer diagnosed in June 2020.  He was found to have Gleason score 9 with a PSA of 410.  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   Prior Therapy: He is status post biopsy obtained on 09/17/2018 for his prostate performed by Dr. Gloriann [name].  This showed a Gleason score of 9 and 12 cores.  He is status post orchiectomy completed on October 12, 2018.   Current therapy: Under consideration to start additional therapy.  Interim History: Jeffery [name] presents today for a follow-up.  He is a 58 year old with a new diagnosis of prostate cancer diagnosed in June 2020.  He presented with acute renal failure and urinary retention related to his prostate cancer.  He was discharged home with Foley catheter after orchiectomy.  Since his discharge,  He does not report any headaches, blurry vision, syncope or seizures. Does not report any fevers, chills or sweats.  Does not report any cough, wheezing or hemoptysis.  Does not report any chest pain, palpitation, orthopnea or leg edema.  Does not report any nausea, vomiting or abdominal pain.  Does not report any constipation or diarrhea.  Does not report any skeletal complaints.    Does not report frequency, urgency or hematuria.  Does not report any skin rashes or lesions. Does not report any heat or cold intolerance.  Does not report any lymphadenopathy or petechiae.  Does not report any anxiety or depression.  Remaining review of systems is negative.    Medications: I have reviewed the patient's current medications.  Current Outpatient Medications  Medication Sig Dispense Refill  . Amino Acids-Protein Hydrolys (FEEDING SUPPLEMENT, PRO-STAT SUGAR FREE 64,) LIQD Take 30 mLs by mouth 3 (three) times daily. 887 mL 0  .  Blood Glucose Monitoring Suppl (RELION CONFIRM GLUCOSE MONITOR) w/Device KIT Use 4 times daily before meals and bedtime as directed. 1 kit 0  . feeding supplement, ENSURE ENLIVE, (ENSURE ENLIVE) LIQD Take 237 mLs by mouth daily. 237 mL 12  . finasteride (PROSCAR) 5 MG tablet Take 5 mg by mouth daily.    [name] glucose blood (RELION GLUCOSE TEST STRIPS) test strip Use as instructed 100 each 12  . Lancets 30G MISC Use 4 times daily before meals as directed. 100 each 0  . Multiple Vitamin (MULTIVITAMIN WITH MINERALS) TABS tablet Take 1 tablet by mouth daily.    . tamsulosin (FLOMAX) 0.4 MG CAPS capsule Take 2 capsules (0.8 mg total) by mouth daily after supper.    . vitamin B-12 (CYANOCOBALAMIN) 1000 MCG tablet Take 1 tablet (1,000 mcg total) by mouth daily. 30 tablet 0   No current facility-administered medications for this visit.      Allergies: No Known Allergies  Past Medical History, Surgical history, Social history, and Family History were reviewed and updated.   Physical Exam:  ECOG:  General appearance: alert and cooperative appeared without distress. Head: Normocephalic, without obvious abnormality Oropharynx: No oral thrush or ulcers. Eyes: No scleral icterus.  Pupils are equal and round reactive to light. Lymph nodes: Cervical, supraclavicular, and axillary nodes normal. Heart:regular rate and rhythm, S1, S2 normal, no murmur, click, rub or gallop Lung:chest clear, no wheezing, rales, normal symmetric air entry Abdomin: soft, non-tender, without masses or organomegaly. Neurological: No motor, sensory deficits.  Intact deep tendon reflexes. Skin: No rashes or  lesions.  No ecchymosis or petechiae. Musculoskeletal: No joint deformity or effusion. Psychiatric: Mood and affect are appropriate.    Lab Results: Lab Results  Component Value Date   WBC 9.2 10/14/2018   HGB 9.3 (L) 10/14/2018   HCT 32.2 (L) 10/14/2018   MCV 89.0 10/14/2018   PLT 324 10/14/2018     Chemistry       Component Value Date/Time   NA 144 10/14/2018 0416   K 4.5 10/14/2018 0416   CL 109 10/14/2018 0416   CO2 28 10/14/2018 0416   BUN 39 (H) 10/14/2018 0416   CREATININE 2.00 (H) 10/14/2018 0416      Component Value Date/Time   CALCIUM 8.1 (L) 10/14/2018 0416   ALKPHOS 143 (H) 06/22/2018 1516   AST 13 06/22/2018 1516   ALT 15 06/22/2018 1516   BILITOT 0.2 06/22/2018 1516          Impression and Plan:   58 year old man with:  1.  Castration-sensitive advanced prostate cancer with disease to the bone and lymphadenopathy diagnosed in June 2020.  The natural course of this disease was reviewed today and treatment options were discussed.  The role of therapy escalation utilizing Zytiga, Xtandi, systemic chemotherapy was reviewed.  Risks and benefits of all these medications and treatments were discussed.  The rationale for therapy escalation was also reviewed.  The option of proceeding with androgen deprivation therapy alone was also reviewed.  Given his age and excellent performance status I recommended treatment escalation and he is interested in [place].  Complication associated with this medication but nausea, vomiting, hypertension, hypokalemia, renal insufficiency and edema.  Low-dose prednisone will be also required with this medication.  The benefit would be improvement in his overall disease survival and progression free survival improvement.  He would like to discuss this with his primary care physician prior to proceeding with this therapy.  Once he is ready to proceed we will arrange this medication started soon as possible.  2.  Androgen deprivation therapy: He is status post orchiectomy.  No additional androgen deprivation is noted.  3.  Bone directed therapy: He will need a bone scan to update his disease status and consideration for Delton See will be made after obtaining dental clearance.  For the time being I recommended calcium and vitamin D supplements.  4.  Prognosis  and goals of care: His therapy is palliative although aggressive therapy is warranted given his excellent performance status.  5. Follow-up: We will be in the future after starting Zytiga once he decides on it.  30  minutes was spent with the patient face-to-face today.  More than 50% of time was spent on reviewing his disease status, treatment options, complication related therapy and future plan of care.    Zola Button, MD 7/17/20202:05 PM

## 2018-11-02 ENCOUNTER — Telehealth: Payer: Self-pay

## 2018-11-02 NOTE — Telephone Encounter (Signed)
Received call from patient stating that his nephrologist Dr. Justin Mend told him to make Dr. Alen Blew aware of new onset left flank pain on Sat. Hurts with movement. Has hydrocodone/APAP 5-325 that helps. He also stated that he is still waiting for his PCP to return his call to discuss starting Zytiga. He then stated that he is uninsured and he is concerned about the cost. Explained that our oral chemo pharmacist usually looks into financial assist programs. Patient stated that he will call back tomorrow with a decision. Explained that will make Dr. Alen Blew aware of above information.

## 2018-11-03 ENCOUNTER — Telehealth: Payer: Self-pay

## 2018-11-03 ENCOUNTER — Other Ambulatory Visit: Payer: Self-pay | Admitting: Oncology

## 2018-11-03 ENCOUNTER — Telehealth: Payer: Self-pay | Admitting: Pharmacist

## 2018-11-03 ENCOUNTER — Telehealth: Payer: Self-pay | Admitting: Oncology

## 2018-11-03 MED ORDER — PREDNISONE 5 MG PO TABS: 5.0000 mg | ORAL_TABLET | Freq: Every day | ORAL | 3 refills | Status: AC

## 2018-11-03 MED ORDER — ABIRATERONE ACETATE 250 MG PO TABS: 1000.0000 mg | ORAL_TABLET | Freq: Every day | ORAL | 0 refills | Status: DC

## 2018-11-03 NOTE — Telephone Encounter (Signed)
Oral Oncology Patient Advocate Encounter  The patient is uninsured, he is working on applying for [employer].   I can help the patient apply for manufacturer assistance in an attempt to get his Zytiga at $0 out of pocket cost to him.  I called the patient and talked to him about manufacturer assistance with [name] and [name]. He does not know how much money he makes but he can provide his 2018 tax return. He plans to come into the office to sign the application and bring his tax return on Thursday 7/23.  Prednisone can be filled at [facility] for $9 for a 90 day supply. The patient will get that when he knows he is getting Zytiga and he also wants to check [facility] to see if it would be cheaper there.  Oral Oncology clinic will continue to follow.  [place] Patient [name] Phone [phone] Fax [phone] 11/03/2018   2:52 PM

## 2018-11-03 NOTE — Telephone Encounter (Signed)
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  Original diagnosis in June 2020 Patient is s/p orchiectomy performed on 10/12/2018 Additional therapy was discussed at office visit on 10/30/18 with Dr. Alen Blew. Patient has had an opportunity to clear this with his primary care physician and is now under evaluation to initiate Zytiga at 1000 mg once daily with prednisone 5 mg once daily.  Labs from Epic assessed  Noted renal dysfunction, 10/14/18 SCr=2.0, est CrCl ~ 60 mL/min, likely somewhat over-estimated with wt=104.6 kg  BPs in Epic assessed, limited data, but available readings WNL  Current medication list in Epic reviewed, no significant DDIs with Zytiga identified.  Prescriptions for Zytiga and prednisone have been e-scribed to the [facility] for benefits analysis and approval by MD.  Oral Oncology Clinic will continue to follow for insurance authorization, copayment issues, initial counseling and start date.  Johny Drilling, PharmD, BCPS, BCOP  11/03/2018 1:22 PM Oral Oncology Clinic [phone]

## 2018-11-03 NOTE — Telephone Encounter (Signed)
Scheduled appt per 7/21 sch message- pt aware - pt aware of appt date and time

## 2018-11-05 NOTE — Telephone Encounter (Signed)
Oral Oncology Patient Advocate Encounter  I faxed the completed application today, 1/95.  The phone number to follow up with [name] and [name] is [phone].  This encounter will be updated until final determination.  [place] Patient [place] Phone [phone] Fax [phone] 11/05/2018   11:42 AM

## 2018-11-09 NOTE — Telephone Encounter (Signed)
Oral Oncology Patient Advocate Encounter  Jeffery [name] and Jeffery [name] has received the patients application and they have everything the need from [place].  The application is currently in processing.  This encounter will be updated until final determination.  [place] Patient [place] Phone [phone] Fax [phone] 11/09/2018   12:19 PM

## 2018-11-11 MED FILL — predniSONE 5 MG TABS: 5 | 90 days supply | Qty: 90 | Fill #0

## 2018-11-12 NOTE — Telephone Encounter (Signed)
Oral Oncology Patient Advocate Encounter  Received notification from [place] and [place] Patient Assistance program that patient has been successfully enrolled into their program to receive Zytiga from the manufacturer at $0 out of pocket until 12/09/2018.  In order for the enrollment to be extended he will need to bring me his 2019 tax return to send to [place] before the 12/09/18 expiration date.    I called and gave him this information. The patient is in the processes of filing his 2019 tax return and will get that to me as soon as his accountant gets it to him. The patient will also call me when his medicaid gets approved.   Patient knows to call the office with questions or concerns.   Oral Oncology Clinic will continue to follow.  [place] Patient [place] Phone [phone] Fax [phone] 11/12/2018    3:05 PM

## 2018-11-12 NOTE — Telephone Encounter (Signed)
Oral Oncology Pharmacist Encounter  Patient has been partially enrolled to receive Zytiga from the manufacturer. Full enrollment pending the manufacturer's receipt of 2019 taxes. Patient states he has already received his 1st fill of Zytiga from patient assistance program pharmacy. He plans to wait until at least 11/16/18 to start taking it, as he wants some time to read about the medication. He requests this pharmacist call him back after 3pm today for initial counseling.  Johny Drilling, PharmD, BCPS, BCOP  11/12/2018 2:49 PM Oral Oncology Clinic [phone]

## 2018-11-13 NOTE — Telephone Encounter (Signed)
Oral Chemotherapy Pharmacist Encounter  I spoke with patient for overview of: Zytiga for the treatment of metastatic, castration-sensitive prostate cancer in conjunction with prednisone, planned duration until disease progression or unacceptable toxicity.   Counseled patient on administration, dosing, side effects, monitoring, drug-food interactions, safe handling, storage, and disposal.  Patient will take Zytiga 250mg  tablets, 4 tablets (1000mg ) by mouth once daily on an empty stomach, 1 hour before or 2 hours after a meal.  Patient states he will take his Zyitga in the morning at [facility] and will wait at least 1 hour before eating.  Patient will take prednisone 5mg  tablet, 1 tablet by mouth one daily with breakfast.  Zytiga start date: 11/16/18  Adverse effects include but are not limited to: peripheral edema, GI upset, hypertension, hot flashes, fatigue, and arthralgias.    Prednisone prescription has been sent to [facility] on 11/03/18. Patient has both Zytiga and prednisone prescriptions in hand as of 11/13/18. Patient knows to start prednisone on the same day as Zytiga start.  Reviewed with patient importance of keeping a medication schedule and plan for any missed doses.  Medication reconciliation performed and medication/allergy list updated.  Patient without insurance. Patient has been partially enrolled to receive Zytiga from [place] and [place] Patient Assistance program (full enrollment pending the manufacturer's receipt of 2019 taxes) and will receive Zytiga from the manufacturer at $0 out of pocket cost.   All questions answered.  Mr. Riolo voiced understanding and appreciation.   Patient knows to call the office with questions or concerns.   Leron Croak, PharmD PGY2 Oncology/Hematology Pharmacy Resident 11/13/2018 11:49 AM Oral Oncology Clinic [phone]

## 2018-11-16 NOTE — Telephone Encounter (Signed)
Oral Oncology Patient Advocate Encounter  Jeffery [name] brought in his 2019 tax return this morning.  I faxed this to [name] and [name] today in an attempt to get his enrollment extended past the 12/09/18 expiration date.  This encounter will be updated until final determination.  [place] Patient [name] Phone [phone] Fax [phone] 11/16/2018   12:06 PM

## 2018-11-17 ENCOUNTER — Telehealth: Payer: Self-pay

## 2018-11-17 ENCOUNTER — Other Ambulatory Visit: Payer: Self-pay | Admitting: Oncology

## 2018-11-17 NOTE — Telephone Encounter (Signed)
Late entry for 11/16/18. Received call from lobby receptionist that patient requesting to speak with RN. In lobby patient explained that he has has edema of bilateral lower extremities since the past Wed/ Thurs and pain of his right hip. He stated that he just came from a visit with his nephrologist that encouraged him to see Dr. Alen Blew and clarify concerns before starting Zytiga. Upon further clarification of symptoms patient stated that he has had "nerve pain" of his left and right legs in the past also involving his shoulders. Explained that Dr. Alen Blew has already left for the day but will follow up tomorrow. On 8/3 contacted patient and informed him that Dr. Alen Blew recommends that he start the [facility] and that he does not feel that he needs to be seen prior to his 8/28 appt. Patient verbalized concern about the nerve pain that he has been having. Dr. Alen Blew agreeable with [facility] visit for the patient to follow up on concerns and patient agreeable as well. Patient understands that a scheduler will call him with his [facility] appt. No other questions or concerns at this time and pt verbalized understanding.

## 2018-11-18 ENCOUNTER — Telehealth: Payer: Self-pay | Admitting: Oncology

## 2018-11-18 NOTE — Telephone Encounter (Signed)
Oral Oncology Patient Advocate Encounter  Zytiga patient assistance application with [name] and Cindee Salt has been approved until 11/03/2019.  I called the patient and left a detailed voicemail.  [place] Patient [place] Phone [phone] Fax [phone] 11/18/2018   3:11 PM

## 2018-11-18 NOTE — Telephone Encounter (Signed)
Scheduled appt per 8/05 sch message - pt aware of apt date and 'time

## 2018-11-19 ENCOUNTER — Other Ambulatory Visit: Payer: Self-pay

## 2018-11-19 ENCOUNTER — Inpatient Hospital Stay: Payer: Medicaid Other | Attending: Oncology | Admitting: Medical

## 2018-11-19 VITALS — BP 136/90 | HR 116 | Temp 98.9°F | Resp 17 | Ht 69.0 in | Wt 248.5 lb

## 2018-11-19 DIAGNOSIS — Z8249 Family history of ischemic heart disease and other diseases of the circulatory system: Secondary | ICD-10-CM | POA: Diagnosis not present

## 2018-11-19 DIAGNOSIS — M25512 Pain in left shoulder: Secondary | ICD-10-CM | POA: Insufficient documentation

## 2018-11-19 DIAGNOSIS — M7918 Myalgia, other site: Secondary | ICD-10-CM | POA: Insufficient documentation

## 2018-11-19 DIAGNOSIS — C61 Malignant neoplasm of prostate: Secondary | ICD-10-CM | POA: Diagnosis present

## 2018-11-19 DIAGNOSIS — G629 Polyneuropathy, unspecified: Secondary | ICD-10-CM

## 2018-11-19 DIAGNOSIS — W172XXA Fall into hole, initial encounter: Secondary | ICD-10-CM | POA: Insufficient documentation

## 2018-11-19 DIAGNOSIS — Z8042 Family history of malignant neoplasm of prostate: Secondary | ICD-10-CM | POA: Insufficient documentation

## 2018-11-19 DIAGNOSIS — M25551 Pain in right hip: Secondary | ICD-10-CM | POA: Diagnosis not present

## 2018-11-19 DIAGNOSIS — E1142 Type 2 diabetes mellitus with diabetic polyneuropathy: Secondary | ICD-10-CM | POA: Diagnosis not present

## 2018-11-19 DIAGNOSIS — Z79899 Other long term (current) drug therapy: Secondary | ICD-10-CM | POA: Insufficient documentation

## 2018-11-19 DIAGNOSIS — M25552 Pain in left hip: Secondary | ICD-10-CM | POA: Diagnosis not present

## 2018-11-19 DIAGNOSIS — Z87891 Personal history of nicotine dependence: Secondary | ICD-10-CM | POA: Diagnosis not present

## 2018-11-19 DIAGNOSIS — M25511 Pain in right shoulder: Secondary | ICD-10-CM | POA: Diagnosis not present

## 2018-11-19 MED ORDER — GABAPENTIN 300 MG PO CAPS: 300.0000 mg | ORAL_CAPSULE | Freq: Two times a day (BID) | ORAL | 1 refills | Status: DC

## 2018-11-19 MED ORDER — HYDROCODONE-ACETAMINOPHEN 5-325 MG PO TABS: ORAL_TABLET | ORAL | 0 refills | Status: DC

## 2018-11-19 MED FILL — GABAPENTIN 300 MG CAPSULE: 300 | 30 days supply | Qty: 60 | Fill #0

## 2018-11-19 MED FILL — HYDROCODON-APAP 5-325: 5-325 | 5 days supply | Qty: 40 | Fill #0

## 2018-11-19 NOTE — Progress Notes (Signed)
Pt presents today with concerns about 'sciatic or nerve pain, or maybe I dislocated my right hip'.  Reports pain in R hip that radiates down leg and into R back, that he states moves all over his body from his shoulders to his feet to his arms on an intermittent basis.  States that he fell a few months ago without any evidence of injury 'but this has been going on ever since then'.  Pt states 'I read up on sciatic pain and that's what I think it is, since it's moving all over my body like this'.  Denies any other recent injury.  Denies CP or SOB (outside of normal DOE at baseline), fever/chills, or N/V/D.  A&Ox4.  Ambulatory w/steady gait.  Denies loss of continence.

## 2018-11-19 NOTE — Patient Instructions (Signed)
COVID-19: How to Protect Yourself and Others Know how it spreads  There is currently no vaccine to prevent coronavirus disease 2019 (COVID-19).  The best way to prevent illness is to avoid being exposed to this virus.  The virus is thought to spread mainly from person-to-person. ? Between people who are in close contact with one another (within about 6 feet). ? Through respiratory droplets produced when an infected person coughs, sneezes or talks. ? These droplets can land in the mouths or noses of people who are nearby or possibly be inhaled into the lungs. ? Some recent studies have suggested that COVID-19 may be spread by people who are not showing symptoms. Everyone should Clean your hands often  Wash your hands often with soap and water for at least 20 seconds especially after you have been in a public place, or after blowing your nose, coughing, or sneezing.  If soap and water are not readily available, use a hand sanitizer that contains at least 60% alcohol. Cover all surfaces of your hands and rub them together until they feel dry.  Avoid touching your eyes, nose, and mouth with unwashed hands. Avoid close contact  Stay home if you are sick.  Avoid close contact with people who are sick.  Put distance between yourself and other people. ? Remember that some people without symptoms may be able to spread virus. ? This is especially important for people who are at higher risk of getting very sick.www.cdc.gov/coronavirus/2019-ncov/need-extra-precautions/people-at-higher-risk.html Cover your mouth and nose with a cloth face cover when around others  You could spread COVID-19 to others even if you do not feel sick.  Everyone should wear a cloth face cover when they have to go out in public, for example to the grocery store or to pick up other necessities. ? Cloth face coverings should not be placed on young children under age 2, anyone who has trouble breathing, or is unconscious,  incapacitated or otherwise unable to remove the mask without assistance.  The cloth face cover is meant to protect other people in case you are infected.  Do NOT use a facemask meant for a healthcare worker.  Continue to keep about 6 feet between yourself and others. The cloth face cover is not a substitute for social distancing. Cover coughs and sneezes  If you are in a private setting and do not have on your cloth face covering, remember to always cover your mouth and nose with a tissue when you cough or sneeze or use the inside of your elbow.  Throw used tissues in the trash.  Immediately wash your hands with soap and water for at least 20 seconds. If soap and water are not readily available, clean your hands with a hand sanitizer that contains at least 60% alcohol. Clean and disinfect  Clean AND disinfect frequently touched surfaces daily. This includes tables, doorknobs, light switches, countertops, handles, desks, phones, keyboards, toilets, faucets, and sinks. www.cdc.gov/coronavirus/2019-ncov/prevent-getting-sick/disinfecting-your-home.html  If surfaces are dirty, clean them: Use detergent or soap and water prior to disinfection.  Then, use a household disinfectant. You can see a list of EPA-registered household disinfectants here. cdc.gov/coronavirus 08/18/2018 This information is not intended to replace advice given to you by your health care provider. Make sure you discuss any questions you have with your health care provider. Document Released: 07/28/2018 Document Revised: 08/26/2018 Document Reviewed: 07/28/2018 Elsevier Patient Education  2020 [employer]  

## 2018-11-20 NOTE — Progress Notes (Signed)
[facility] Progress Note   Jeffery [name] [id number] [birth year] 58 y.o.  Jeffery [name] is managed by Dr. Alen Blew  Actively treated with chemotherapy/immunotherapy/hormonal therapy: no  Next scheduled appointment with provider: 12/11/2018  Assessment: Plan:    Prostate cancer: Jeffery [name] was last seen by Dr. Alen Blew on 07/70/2020 and is scheduled to see him in follow-up on 12/11/2018.  He has not had his CT of the abdomen/pelvis or bone scan as yet.  These have been ordered today and have been requested for next week.  Neuropathy: It appears as though some portion of the patient's pain may be neuropathic.  Based on this he was given a prescription for Neurontin 300 mg p.o. twice daily.  Musculoskeletal pain: The patient reports musculoskeletal pain in multiple locations.  He has been referred for a bone scan.  Additionally he was given a prescription for Norco.  Please see After Visit Summary for patient specific instructions.  Future Appointments  Date Time Provider [place]  12/11/2018  3:00 PM [facility] 3 [facility] None  12/11/2018  3:30 PM Shadad, Mathis Dad, MD [facility] None    Orders Placed This Encounter  Procedures  . CT Abdomen Pelvis W Contrast  . NM Bone Scan Whole Body       Subjective:   [name] is a 59 y.o. (DOB [birth year]) male.  Chief Complaint:  Chief Complaint  Patient presents with  . Generalized Body Aches    HPI Jeffery [name] is a 58 year old male with a history of recently diagnosed prostate cancer.  He is status post bilateral orchiectomy.  He presents to the office today with a report of multiple areas of musculoskeletal pain.  He reports that he feels like his hips are dislocated.  He fell in a hole within the past month and believes that some of his pain could be associated with this.  He also is concerned that his pain could be related to sciatica.  He states that he  has been told that his sciatic nerve runs bilaterally from his foot to his hips and then crosses over and runs to his the opposite shoulders.  He explains that he is having pain in his right foot that crosses over to his left hip that goes to his left shoulder and is having the same with pain in his left foot which radiates to his right hip and to his right shoulder.  He has been taking Vicodin at night with some relief.  He reports that the sensation of pain he is having is burning.  He has a history of diabetes which is controlled with diet.  He is currently on no medications for his diabetes.  He has not yet had a bone scan completed.  He claims that he had imaging studies done while he was in the hospital at [facility] recently however on reviewing his chart there is no evidence of any imaging studies being done.  He is agreeable to have his previously ordered CT scan of his abdomen and pelvis and his bone scan completed next week.  He denies nausea, vomiting, diarrhea, constipation, fevers, chills, shortness of breath, or dyspnea upon exertion.  Medications: I have reviewed the patient's current medications.  Allergies: No Known Allergies  Past Medical History:  Diagnosis Date  . Diabetes mellitus, new onset ([place]) 07/2016  . Enlarged prostate   . HLD (hyperlipidemia)     Past Surgical History:  Procedure Laterality Date  .  KNEE ARTHROSCOPY Right   . ORCHIECTOMY Bilateral 10/12/2018   Procedure: ORCHIECTOMY;  Surgeon: Lucas Mallow, MD;  Location: [facility];  Service: Urology;  Laterality: Bilateral;  . TRANSURETHRAL RESECTION OF PROSTATE  10/12/2018   Procedure: CYSTOSCOPY WITH URETHRAL DILATION;  Surgeon: Lucas Mallow, MD;  Location: [facility];  Service: Urology;;    Family History  Problem Relation Age of Onset  . Prostate cancer Father   . Heart disease Father     Social History   Socioeconomic History  . Marital status: Divorced    Spouse name: Not on file  . Number of  children: 1  . Years of education: Not on file  . Highest education level: Not on file  Occupational History  . Occupation: Teaching laboratory technician  Social Needs  . Financial resource strain: Not on file  . Food insecurity    Worry: Not on file    Inability: Not on file  . Transportation needs    Medical: Not on file    Non-medical: Not on file  Tobacco Use  . Smoking status: Former Smoker    Types: Cigarettes    Quit date: 2015    Years since quitting: 5.6  . Smokeless tobacco: Never Used  . Tobacco comment: 2015  Substance and Sexual Activity  . Alcohol use: No  . Drug use: No  . Sexual activity: Not on file  Lifestyle  . Physical activity    Days per week: Not on file    Minutes per session: Not on file  . Stress: Not on file  Relationships  . Social Herbalist on phone: Not on file    Gets together: Not on file    Attends religious service: Not on file    Active member of club or organization: Not on file    Attends meetings of clubs or organizations: Not on file    Relationship status: Not on file  . Intimate partner violence    Fear of current or ex partner: Not on file    Emotionally abused: Not on file    Physically abused: Not on file    Forced sexual activity: Not on file  Other Topics Concern  . Not on file  Social History Narrative   The patient is separated he has 1 son, he is a Academic librarian.   Quit smoking 2015, no alcohol no caffeine no tobacco or drug use   No health insurance    Past Medical History, Surgical history, Social history, and Family history were reviewed and updated as appropriate.   Please see review of systems for further details on the patient's review from today.   Review of Systems:  Review of Systems  Constitutional: Negative for chills, diaphoresis and fever.  HENT: Negative for trouble swallowing and voice change.   Respiratory: Negative for cough, chest tightness, shortness of  breath and wheezing.   Cardiovascular: Negative for chest pain and palpitations.  Gastrointestinal: Negative for abdominal pain, constipation, diarrhea, nausea and vomiting.  Musculoskeletal: Positive for arthralgias and myalgias. Negative for back pain.  Neurological: Negative for dizziness, light-headedness and headaches.    Objective:   Physical Exam:  BP 136/90 (BP Location: Right Arm, Patient Position: Sitting)   Pulse (!) 116 Comment: Liza RN was notified  Temp 98.9 F (37.2 C) (Oral)   Resp 17   Ht 5\' 9"  (1.753 m)   Wt 248 lb 8 oz (112.7 kg)   SpO2  95%   BMI 36.70 kg/m  ECOG: 1  Physical Exam Constitutional:      General: He is not in acute distress.    Appearance: He is not diaphoretic.  HENT:     Head: Normocephalic and atraumatic.  Cardiovascular:     Rate and Rhythm: Normal rate and regular rhythm.     Heart sounds: Normal heart sounds. No murmur. No friction rub. No gallop.   Pulmonary:     Effort: Pulmonary effort is normal. No respiratory distress.     Breath sounds: Normal breath sounds. No wheezing or rales.  Musculoskeletal:        General: No swelling, tenderness or deformity.     Right lower leg: No edema.     Left lower leg: No edema.     Comments: Hips show intact internal and external rotation.  No crepitus, popping, or laxity was noted bilaterally.  Skin:    General: Skin is warm and dry.     Findings: No erythema or rash.  Neurological:     Mental Status: He is alert.     Coordination: Coordination normal.     Gait: Gait normal.     Comments: No tenderness over the sciatic notches bilaterally.  Straight leg raises were negative bilaterally.     Lab Review:     Component Value Date/Time   NA 144 10/14/2018 0416   K 4.5 10/14/2018 0416   CL 109 10/14/2018 0416   CO2 28 10/14/2018 0416   GLUCOSE 130 (H) 10/14/2018 0416   BUN 39 (H) 10/14/2018 0416   CREATININE 2.00 (H) 10/14/2018 0416   CALCIUM 8.1 (L) 10/14/2018 0416   PROT 7.4  06/22/2018 1516   ALBUMIN 2.5 (L) 10/14/2018 0416   AST 13 06/22/2018 1516   ALT 15 06/22/2018 1516   ALKPHOS 143 (H) 06/22/2018 1516   BILITOT 0.2 06/22/2018 1516   GFRNONAA 36 (L) 10/14/2018 0416   GFRAA 42 (L) 10/14/2018 0416       Component Value Date/Time   WBC 9.2 10/14/2018 0416   RBC 3.62 (L) 10/14/2018 0416   HGB 9.3 (L) 10/14/2018 0416   HCT 32.2 (L) 10/14/2018 0416   PLT 324 10/14/2018 0416   MCV 89.0 10/14/2018 0416   MCH 25.7 (L) 10/14/2018 0416   MCHC 28.9 (L) 10/14/2018 0416   RDW 15.9 (H) 10/14/2018 0416   LYMPHSABS 2.0 10/14/2018 0416   MONOABS 0.7 10/14/2018 0416   EOSABS 0.2 10/14/2018 0416   BASOSABS 0.0 10/14/2018 0416   -------------------------------  Imaging from last 24 hours (if applicable):  Radiology interpretation: No results found.

## 2018-11-24 ENCOUNTER — Other Ambulatory Visit: Payer: Self-pay

## 2018-11-24 MED ORDER — ABIRATERONE ACETATE 250 MG PO TABS: ORAL_TABLET | ORAL | 0 refills | Status: DC

## 2018-11-27 ENCOUNTER — Encounter: Payer: Self-pay | Admitting: Medical Oncology

## 2018-12-01 ENCOUNTER — Other Ambulatory Visit: Payer: Self-pay | Admitting: Medical

## 2018-12-01 DIAGNOSIS — M7918 Myalgia, other site: Secondary | ICD-10-CM

## 2018-12-01 MED FILL — HYDROCODON-APAP 5-325: 5-325 | 5 days supply | Qty: 40 | Fill #0

## 2018-12-01 NOTE — Telephone Encounter (Signed)
Dr. Alen Blew, please advise refill.

## 2018-12-02 ENCOUNTER — Telehealth: Payer: Self-pay

## 2018-12-02 NOTE — Telephone Encounter (Signed)
Patient called requesting information about why his CT scan and whole body bone scan have not been scheduled. Call placed to Central Scheduling and representative stated it was an oversight on their part. Scans ready for scheduling and patient given the number to Central Scheduling and stated he will call to schedule. Patient also wanted to let Sandi Mealy, PA know that he takes Gabapentin at 7am and 8pm but the med wears off around 2:30. Per Sandi Mealy, PA patient can take Gabapentin three times daily and if patient begins to feel groggy or has decreased cognitive functioning med may be taken 1 pill in the morning and 2 pills at night. Patient verbalized understanding and said he is getting ready to call to schedule scans.

## 2018-12-07 ENCOUNTER — Telehealth: Payer: Self-pay | Admitting: Emergency Medicine

## 2018-12-07 ENCOUNTER — Telehealth: Payer: Self-pay

## 2018-12-07 NOTE — Telephone Encounter (Signed)
Received VM from [facility] in Imaging about pt having labs drawn before scans on 12/09/2018 d/t hx of DM.  Returned call.  I-Stat creatinine lab order requested.  Tedra Coupe to make lab appt.  Pt has additional lab appt on 12/11/2018 before seeing MD Shadad for CMP, CBC, and PSAG.  Lab appt on 12/11/2018 to be cancelled and labs ordered for that day moved to 12/09/2018 appt before scans.  Called pt to alert him to come in early on 12/09/2018 for lab appt before his scans.  Pt verbalized understanding of appt dates/times.  However pt then reported recent chest congestion and dry cough without fever or chills or any other resp symptoms or N/V/D.  States that he went to his PCP today and received a Covid-19 test due to his symptoms which he will reportedly receive the results of by telephone in 5-7 days.  Pt states he was advised to quarantine until then with a maximum quarantine time of 14 days.  Pt advised that he will be called back by MD Shadad's office to discuss next steps.  RN Seth Bake for [name] made aware, states she will contact the patient about rescheduling his upcoming imaging/lab/MD appts d/t his pending Covid-19 test, and that she will manage the required labs for his scans.

## 2018-12-07 NOTE — Telephone Encounter (Signed)
Followed up with patient and confirmed that he did receive the COVID test today through his PCP. Patient confirmed this and stated that he will receive the results in 5 to 7 days. Explained to the patient that once he receives the results to contact this office so we can follow up with his PCP to receive the results and then look into rescheduling the appointments. Patient verbalized understanding.

## 2018-12-08 ENCOUNTER — Telehealth: Payer: Self-pay

## 2018-12-08 NOTE — Telephone Encounter (Signed)
-----   Message from Wyatt Portela, MD sent at 12/08/2018  7:27 AM EDT ----- Regarding: RE: appts canceled Agree. Even if he is negative, his appointment needs to be pushed for at least 3 weeks. Thanks.  ----- Message ----- From: Scot Dock, RN Sent: 12/07/2018   3:22 PM EDT To: Wyatt Portela, MD Subject: appts canceled                                 Patient was seen by PCP today and received a COVID test due to symptoms. Explained to patient that all appointments will need to be cancelled until he receives COVID results and then we can move forward with rescheduling.

## 2018-12-09 ENCOUNTER — Encounter (HOSPITAL_COMMUNITY): Payer: Self-pay

## 2018-12-09 ENCOUNTER — Ambulatory Visit (HOSPITAL_COMMUNITY): Admission: RE | Admit: 2018-12-09 | Payer: Self-pay | Source: Ambulatory Visit

## 2018-12-10 ENCOUNTER — Telehealth: Payer: Self-pay | Admitting: Oncology

## 2018-12-10 ENCOUNTER — Telehealth: Payer: Self-pay

## 2018-12-10 NOTE — Telephone Encounter (Signed)
Contacted patient and made aware of rescheduled appointments. Provided appt date and times for the lab and scans and directions for arrival, NPO 4 hrs prior, and when to drink the contrast. Patient verbalized all information back. He is also aware that the scheduler will contact him with his follow up appointment for Dr. Alen Blew. He had no other questions or concerns and verbalized understanding.

## 2018-12-10 NOTE — Telephone Encounter (Signed)
Scheduled appt per 8/26 sch message- pt is aware of appt date and time  

## 2018-12-11 ENCOUNTER — Ambulatory Visit
Admission: RE | Admit: 2018-12-11 | Discharge: 2018-12-11 | Disposition: A | Discharge status: Home / Self Care | Payer: Self-pay | Source: Ambulatory Visit | Attending: Family Medicine | Admitting: Family Medicine

## 2018-12-11 ENCOUNTER — Other Ambulatory Visit: Payer: Self-pay

## 2018-12-11 ENCOUNTER — Ambulatory Visit: Payer: Self-pay | Admitting: Oncology

## 2018-12-11 ENCOUNTER — Other Ambulatory Visit: Payer: Self-pay | Admitting: Family Medicine

## 2018-12-11 DIAGNOSIS — R059 Cough, unspecified: Secondary | ICD-10-CM

## 2018-12-11 DIAGNOSIS — R05 Cough: Secondary | ICD-10-CM

## 2018-12-11 IMAGING — CR RIGHT RIBS - 2 VIEW
4 series · 4 of 4 positions shown · non-contrast
Comparison: No prior rib imaging.

CLINICAL DATA: 58-year-old with acute onset of severe cough that
began this morning and is associated with RIGHT LOWER POSTERIOR rib
pain. No known injury. Current history of metastatic prostate
cancer.

EXAM:
RIGHT RIBS - 2 VIEW

[w ribs ap/pa upper right *]
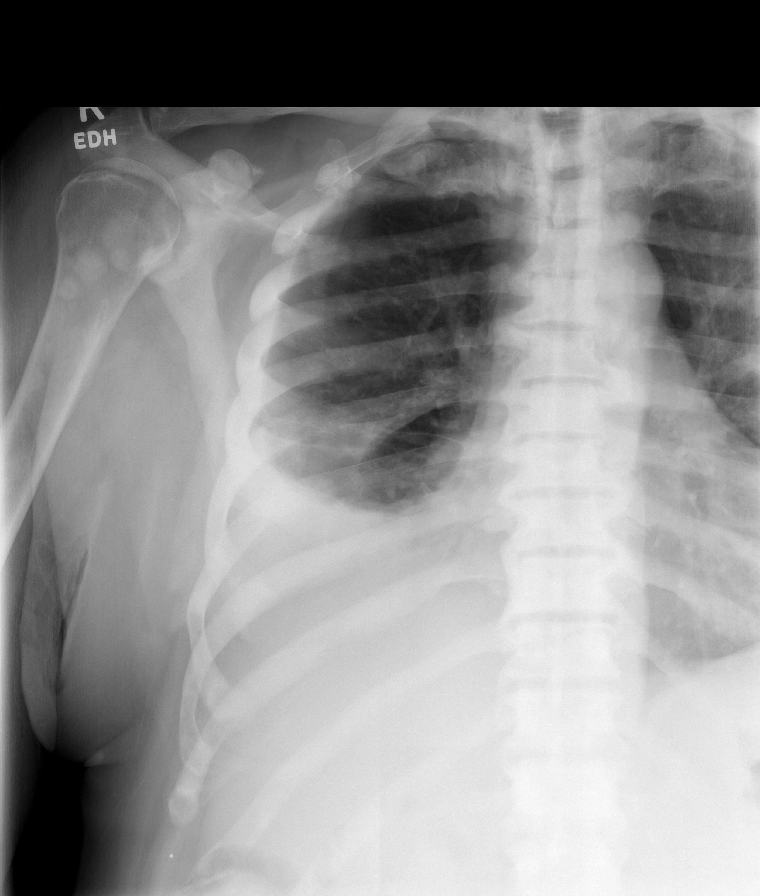

[w ribs oblique right * (1 of 2)]
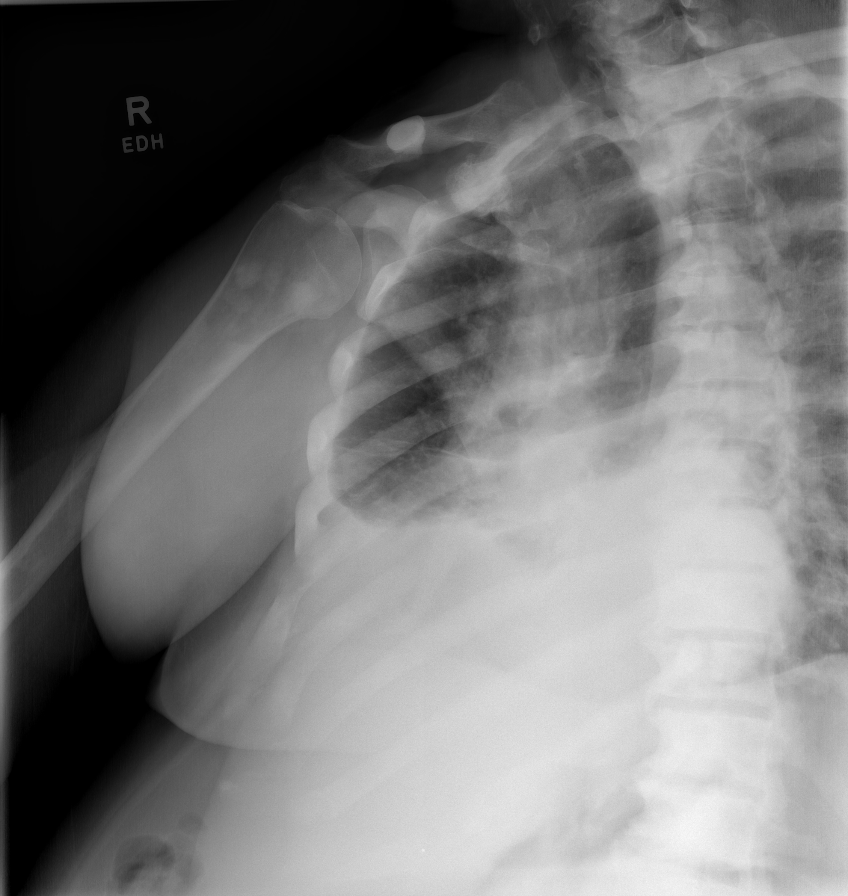

[w ribs oblique right * (2 of 2)]
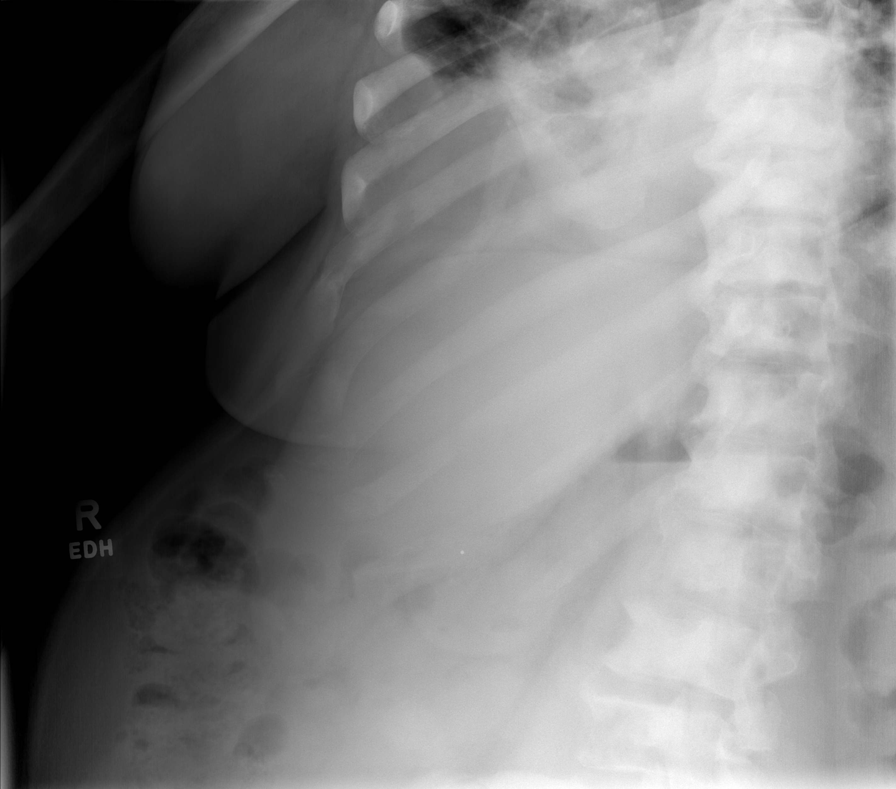

[w ribs ap/pa lower right *]
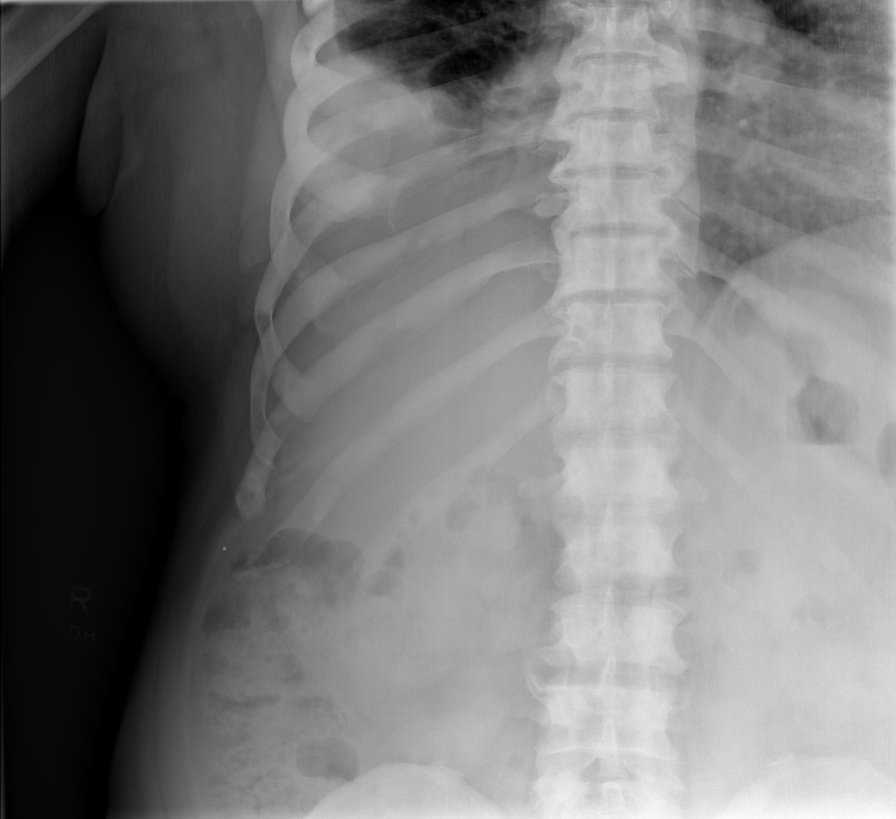

[4 of 4 positions shown; findings below may reference images not displayed]

FINDINGS: Site of maximum pain and tenderness was marked with a small BB.
Widespread osseous metastatic disease throughout the visualized
thorax including all of the RIGHT ribs. Some of the rib metastases
are lytic in their appearance, though the predominance of metastatic
disease is sclerotic. No visible fractures.
IMPRESSION: No acute osseous abnormality. Widespread osseous metastatic disease
throughout the visualized thorax including all of the RIGHT ribs.

## 2018-12-11 IMAGING — CR CHEST - 2 VIEW
2 series · 2 of 2 positions shown · non-contrast
Comparison: [DATE].

CLINICAL DATA: 58-year-old with acute onset of severe cough that
began this morning and is associated with RIGHT LOWER POSTERIOR rib
pain. No known injury. Current history of metastatic prostate
cancer.

EXAM:
CHEST - 2 VIEW

[w chest pa]
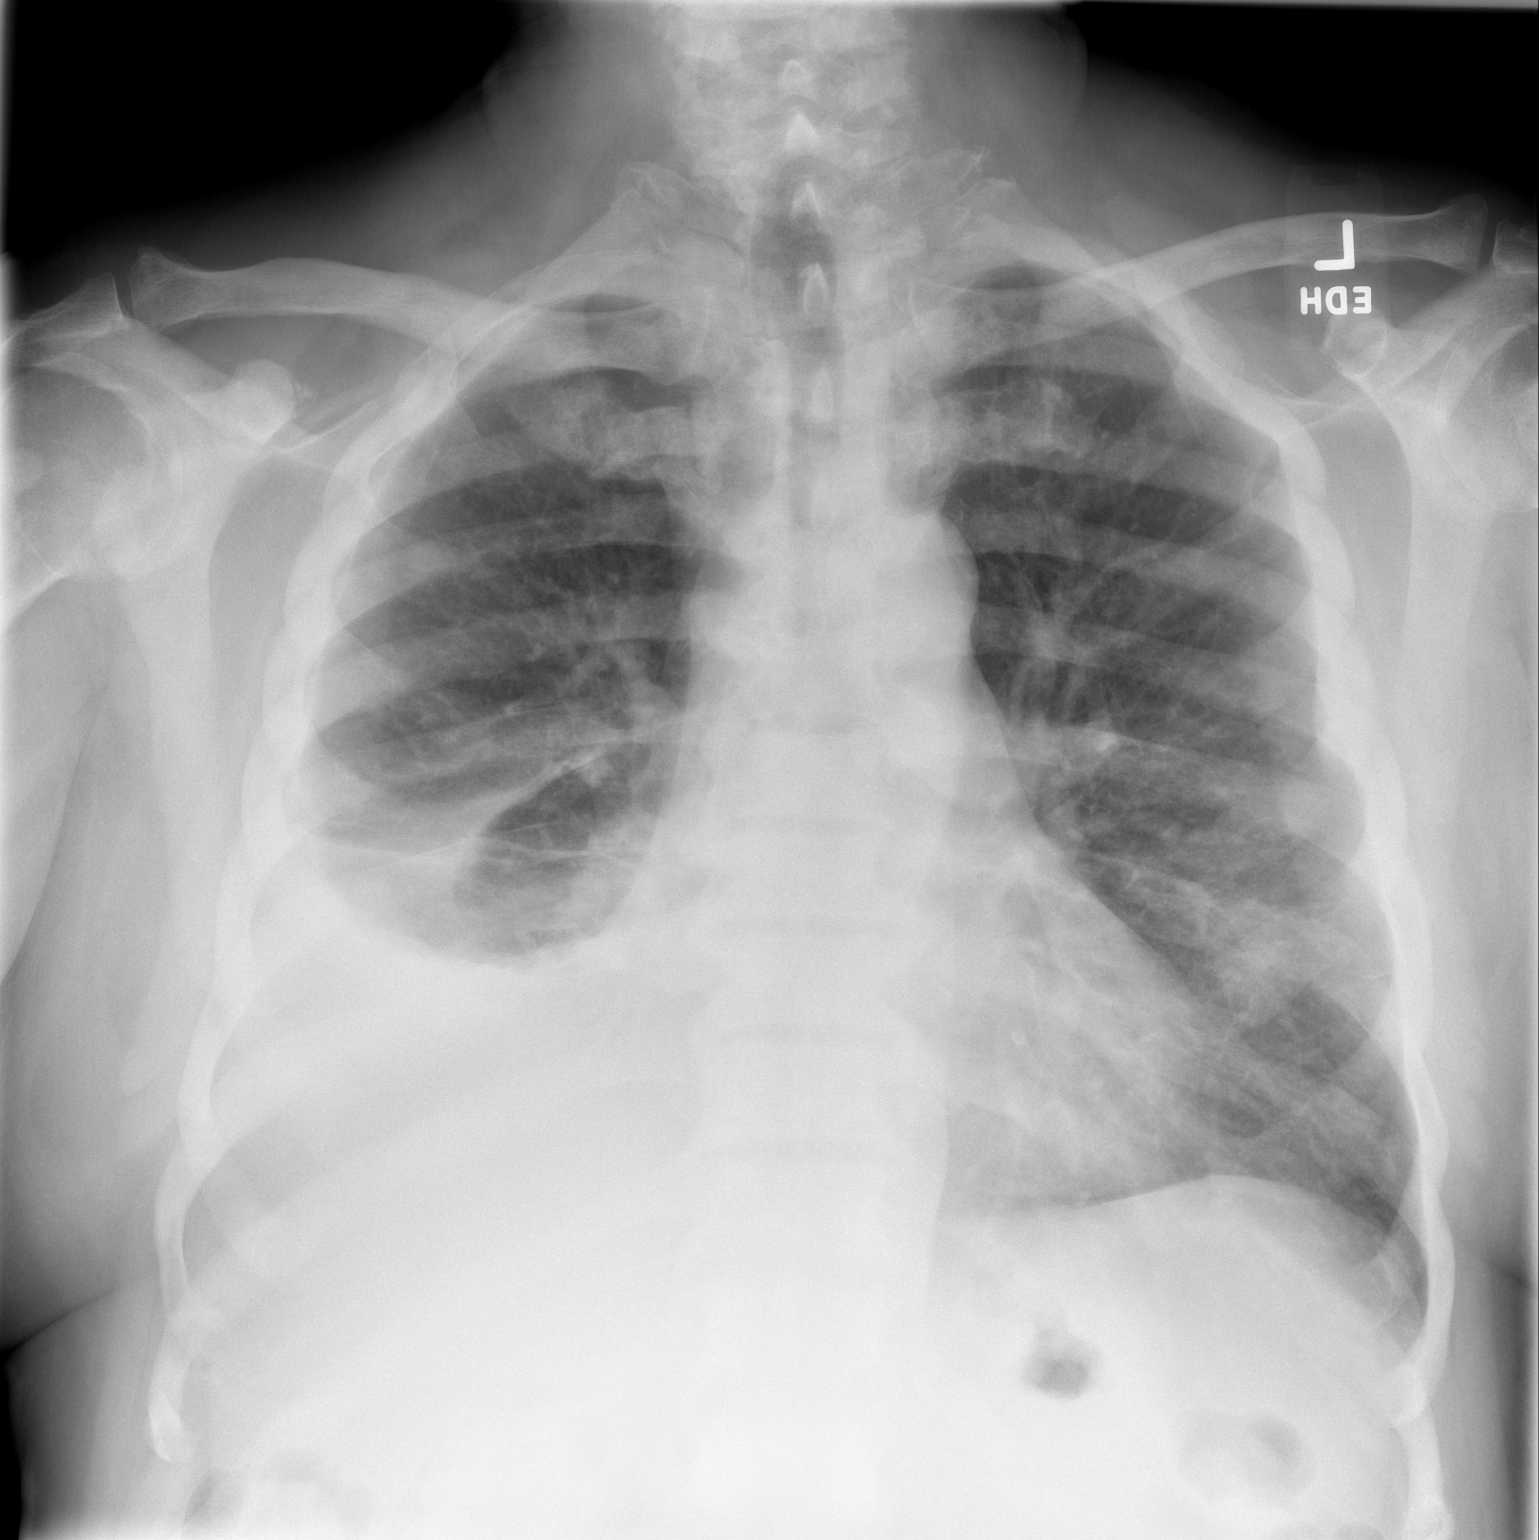

[w chest lat]
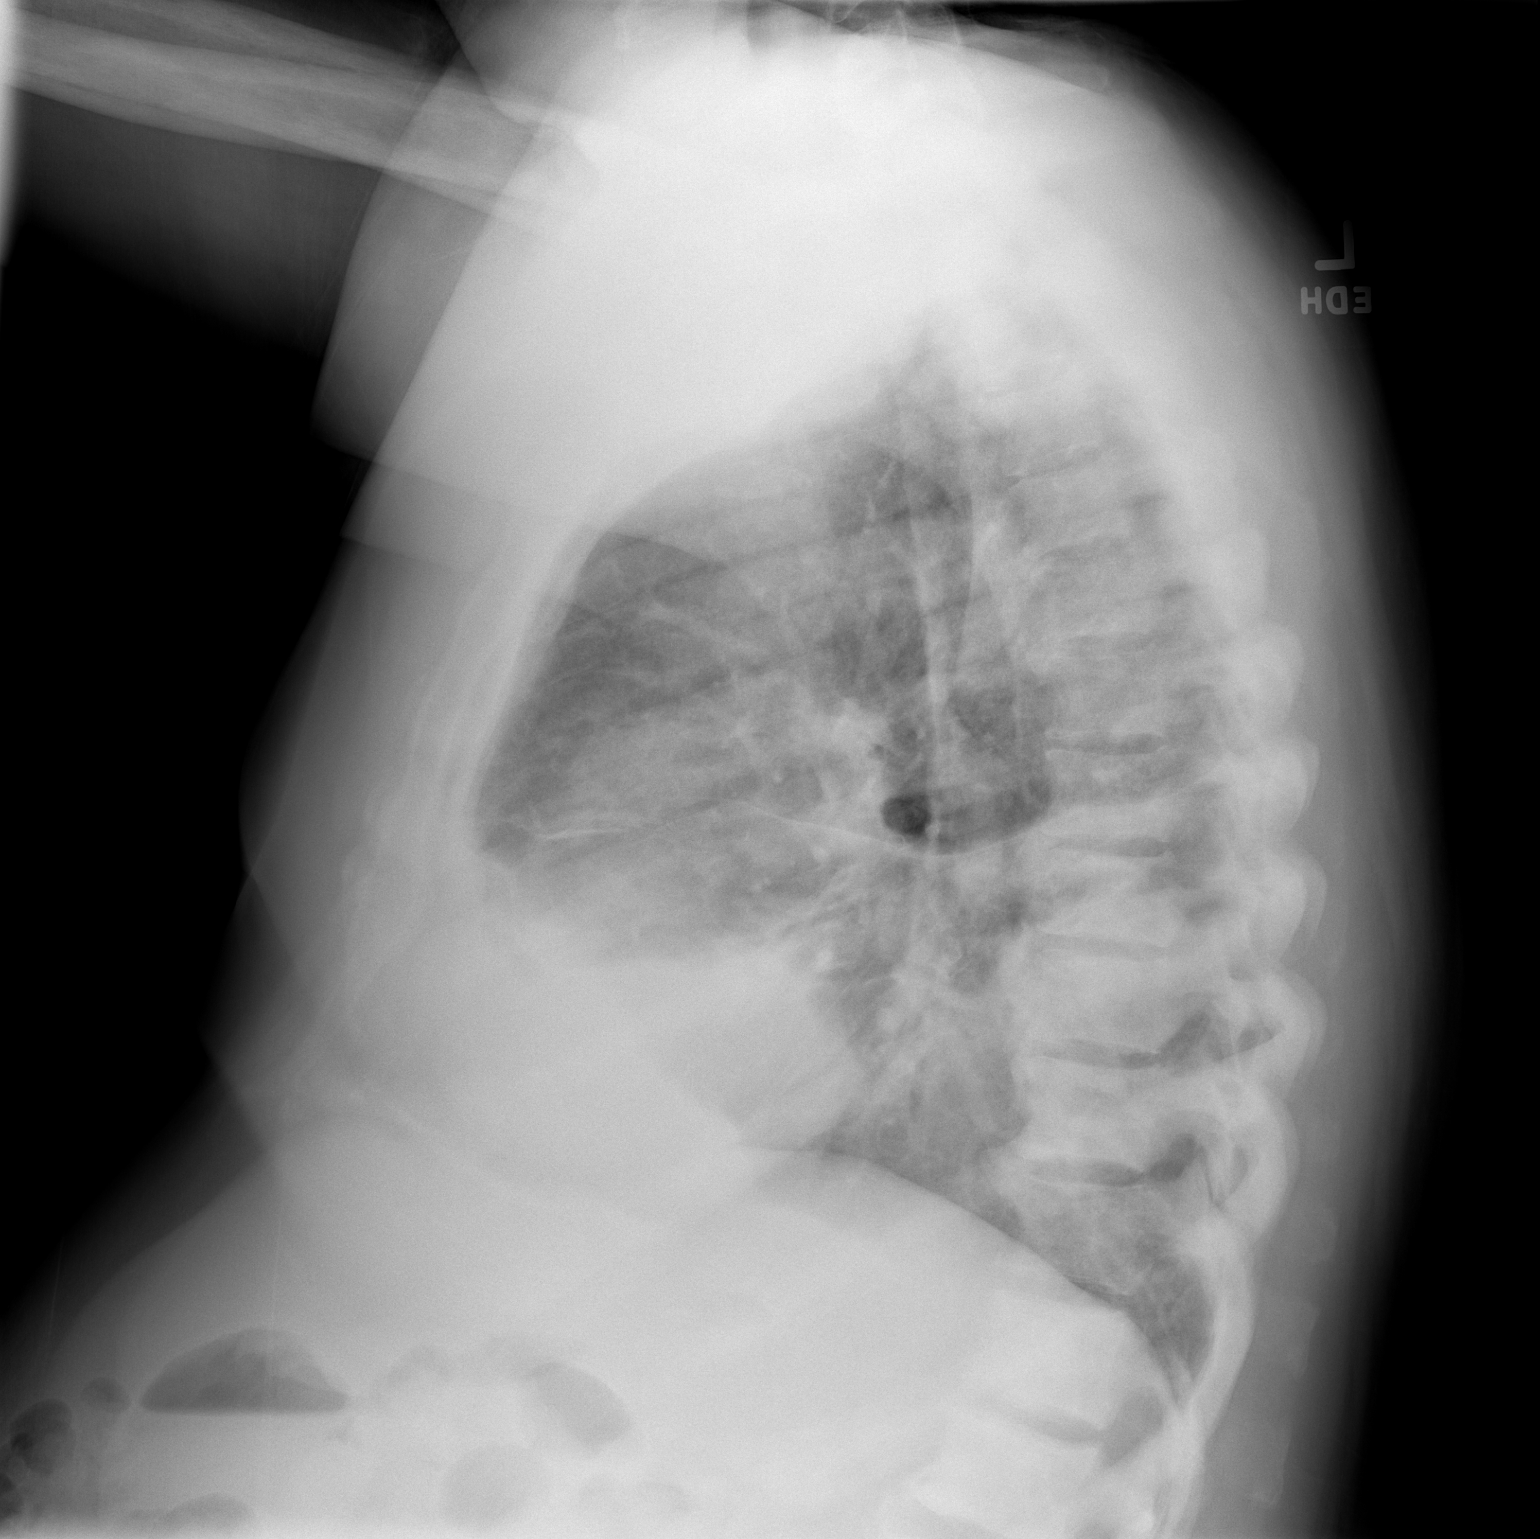

[2 of 2 positions shown; findings below may reference images not displayed]

FINDINGS: Cardiomediastinal silhouette unremarkable. Large RIGHT pleural
effusion and associated consolidation in the RIGHT LOWER LOBE and
RIGHT MIDDLE LOBE. No LEFT pleural effusion. Lungs otherwise clear.
Normal pulmonary vascularity. Normal bronchovascular markings.

Interval development of widespread osseous metastatic disease
throughout the thorax since the chest x-ray 3 months ago.
IMPRESSION: 1. Large RIGHT pleural effusion and associated atelectasis and/or
pneumonia involving the RIGHT LOWER LOBE and RIGHT MIDDLE LOBE.
2. Interval development of widespread osseous metastatic disease
throughout the thorax since the chest x-ray 3 months ago.

## 2018-12-11 MED FILL — HYDROCODONE-CHLORPHEN ER SU: 10-8 | 24 days supply | Qty: 240 | Fill #0

## 2018-12-11 MED FILL — GABAPENTIN 300 MG CAPSULE: 300 | 30 days supply | Qty: 90 | Fill #0

## 2018-12-14 ENCOUNTER — Telehealth: Payer: Self-pay

## 2018-12-14 ENCOUNTER — Encounter: Payer: Self-pay | Admitting: Medical Oncology

## 2018-12-14 ENCOUNTER — Other Ambulatory Visit (HOSPITAL_COMMUNITY)
Admission: RE | Admit: 2018-12-14 | Discharge: 2018-12-14 | Disposition: A | Discharge status: Home / Self Care | Payer: Medicaid Other | Source: Ambulatory Visit | Attending: Medical | Admitting: Medical

## 2018-12-14 ENCOUNTER — Other Ambulatory Visit: Payer: Self-pay | Admitting: Medical

## 2018-12-14 DIAGNOSIS — Z01812 Encounter for preprocedural laboratory examination: Secondary | ICD-10-CM | POA: Diagnosis present

## 2018-12-14 DIAGNOSIS — Z20828 Contact with and (suspected) exposure to other viral communicable diseases: Secondary | ICD-10-CM | POA: Insufficient documentation

## 2018-12-14 DIAGNOSIS — J90 Pleural effusion, not elsewhere classified: Secondary | ICD-10-CM

## 2018-12-14 NOTE — Telephone Encounter (Signed)
Adonis Brook from [employer] called and stated patient had a chest x ray due to cough and shortness of breath on Friday which showed possible large right pleural effusion and atelectasis. Chest x-ray report faxed over by [facility] and reviewed by Sandi Mealy, PA. Sandi Mealy, PA stated he will contact patient. No further instructions received at this time.

## 2018-12-15 ENCOUNTER — Ambulatory Visit (HOSPITAL_COMMUNITY)
Admission: RE | Admit: 2018-12-15 | Discharge: 2018-12-15 | Disposition: A | Discharge status: Home / Self Care | Payer: Medicaid Other | Source: Ambulatory Visit | Attending: Radiology | Admitting: Radiology

## 2018-12-15 ENCOUNTER — Ambulatory Visit (HOSPITAL_COMMUNITY)
Admission: RE | Admit: 2018-12-15 | Discharge: 2018-12-15 | Disposition: A | Discharge status: Home / Self Care | Payer: Medicaid Other | Source: Ambulatory Visit | Attending: Medical | Admitting: Medical

## 2018-12-15 ENCOUNTER — Other Ambulatory Visit: Payer: Self-pay | Admitting: Oncology

## 2018-12-15 ENCOUNTER — Other Ambulatory Visit: Payer: Self-pay

## 2018-12-15 DIAGNOSIS — M7918 Myalgia, other site: Secondary | ICD-10-CM

## 2018-12-15 DIAGNOSIS — J90 Pleural effusion, not elsewhere classified: Secondary | ICD-10-CM | POA: Diagnosis present

## 2018-12-15 DIAGNOSIS — Z9889 Other specified postprocedural states: Secondary | ICD-10-CM | POA: Insufficient documentation

## 2018-12-15 DIAGNOSIS — C7951 Secondary malignant neoplasm of bone: Secondary | ICD-10-CM | POA: Insufficient documentation

## 2018-12-15 LAB — SARS CORONAVIRUS 2 (TAT 6-24 HRS): SARS Coronavirus 2: NEGATIVE

## 2018-12-15 IMAGING — US US THORACENTESIS ASP PLEURAL SPACE W/IMG GUIDE
1 series · 5 of 5 positions shown · non-contrast
Comparison: none

INDICATION: Patient with history of metastatic prostate cancer, dyspnea, right
pleural effusion; request made for diagnostic and therapeutic right
thoracentesis.

[Series 1: us thoracentesis asp pleural space w/img guide · 5 of 5 slices shown]
[im 1/5]
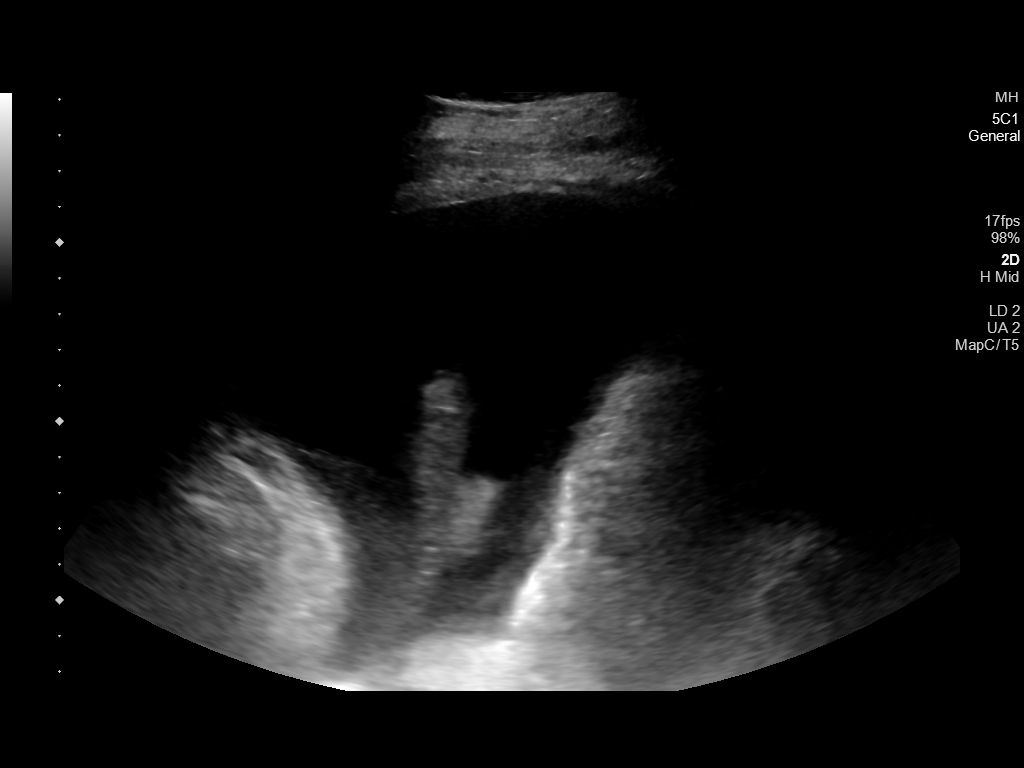
[im 2/5]
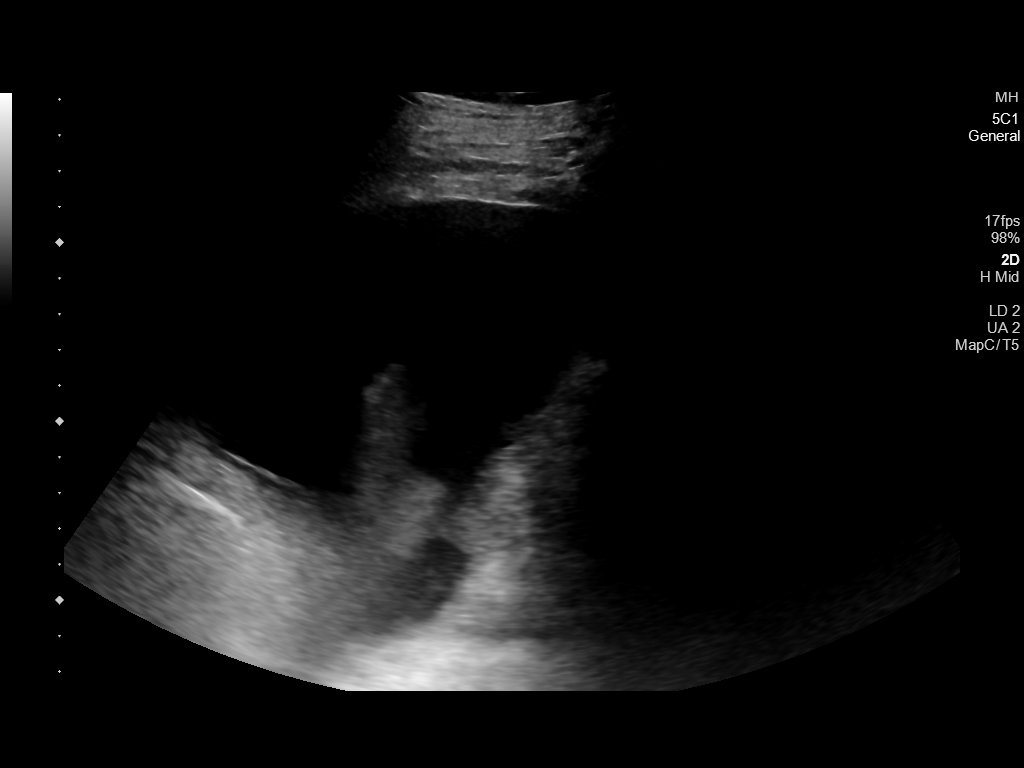
[im 3/5]
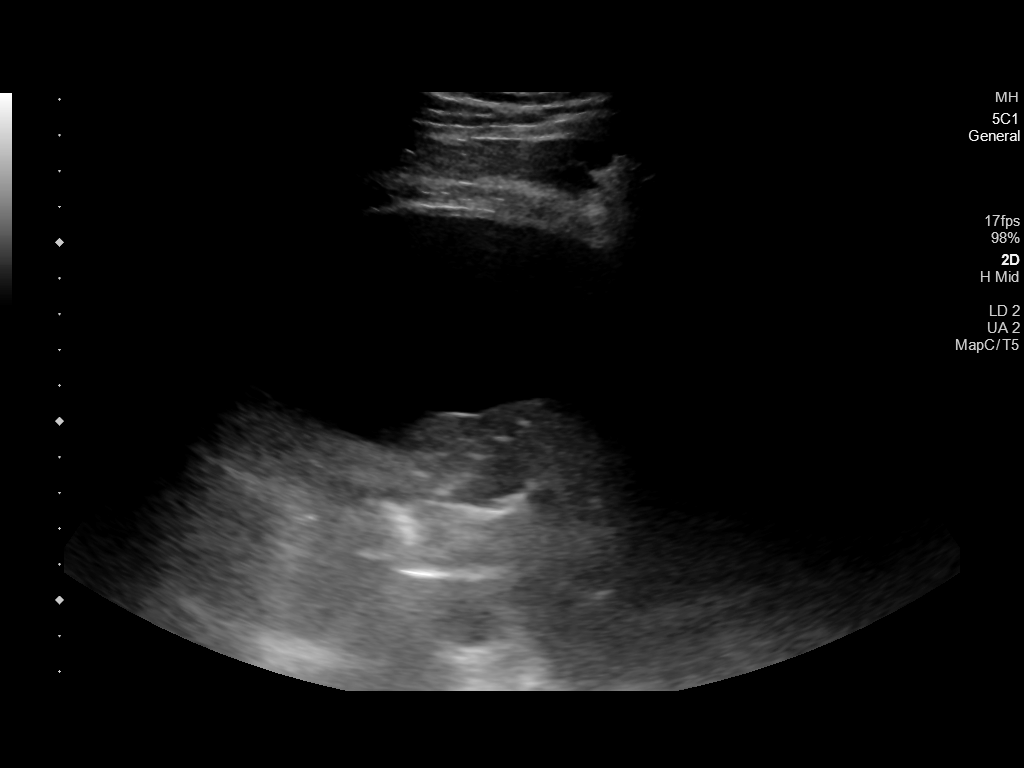
[im 4/5]
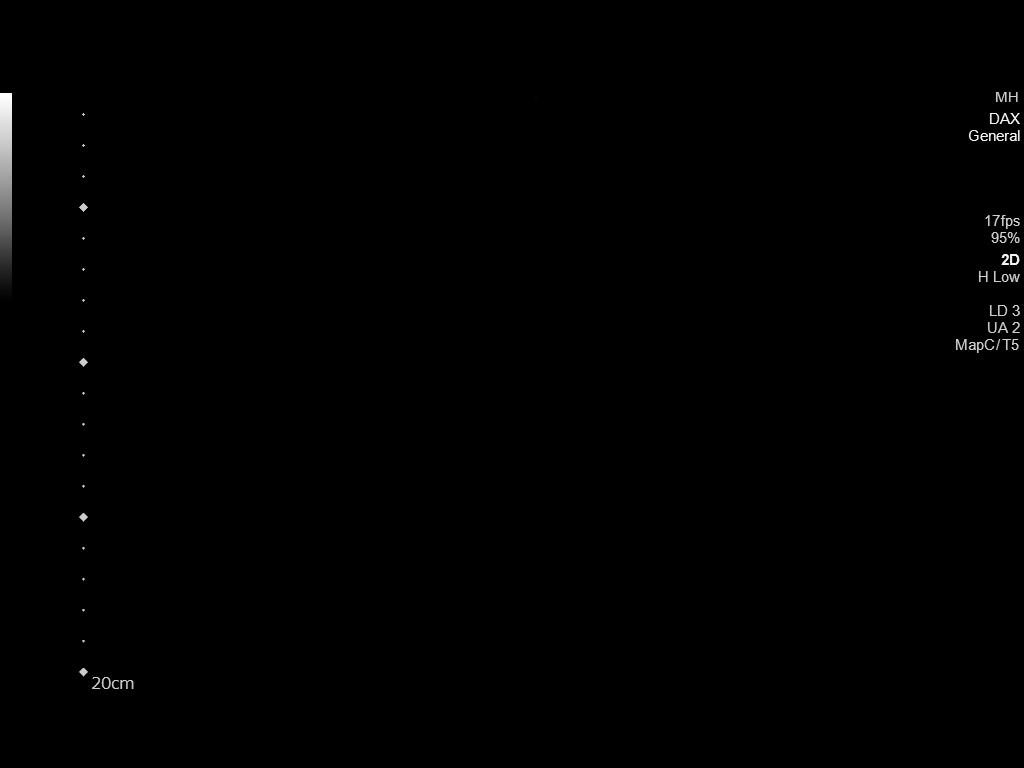
[im 5/5]
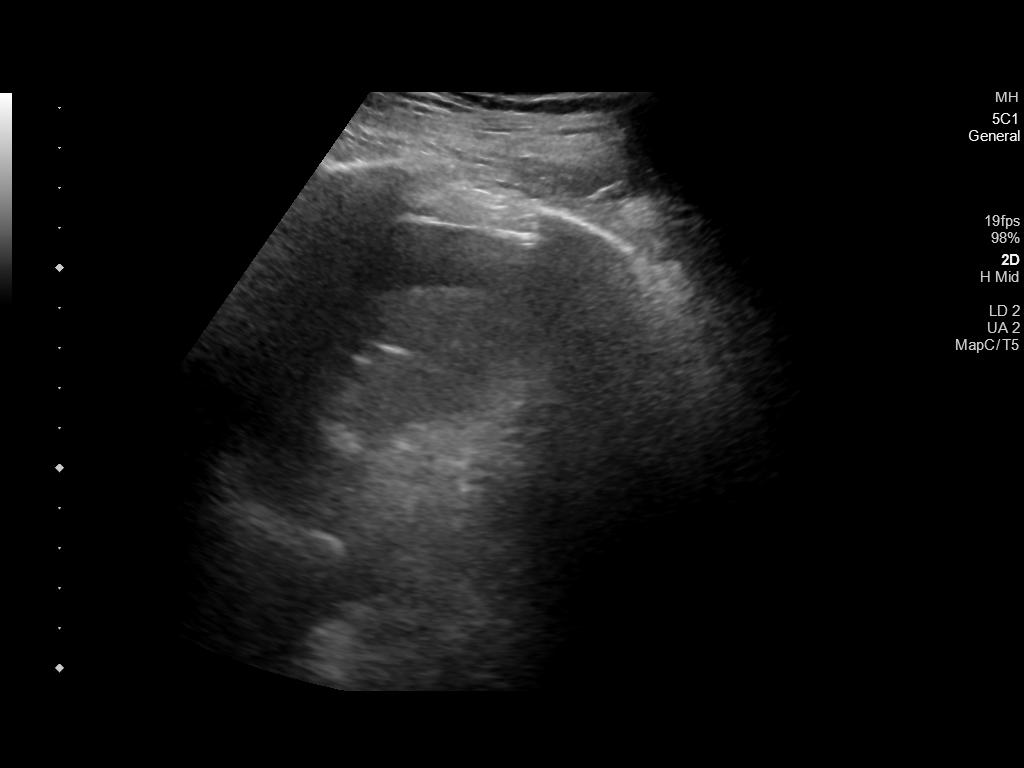

[5 of 5 positions shown; findings below may reference images not displayed]

EXAM:
ULTRASOUND GUIDED DIAGNOSTIC AND THERAPEUTIC RIGHT THORACENTESIS

MEDICATIONS:
None

COMPLICATIONS:
None immediate.

PROCEDURE:
An ultrasound guided thoracentesis was thoroughly discussed with the
patient and questions answered. The benefits, risks, alternatives
and complications were also discussed. The patient understands and
wishes to proceed with the procedure. Written consent was obtained.

Ultrasound was performed to localize and mark an adequate pocket of
fluid in the right chest. The area was then prepped and draped in
the normal sterile fashion. 1% Lidocaine was used for local
anesthesia. Under ultrasound guidance a 6 Fr Safe-T-Centesis
catheter was introduced. Thoracentesis was performed. The catheter
was removed and a dressing applied.
FINDINGS: A total of approximately 1.1 liters of amber/blood-tinged fluid was
removed. Samples were sent to the laboratory as requested by the
clinical team.
IMPRESSION: Successful ultrasound guided diagnostic and therapeutic right
thoracentesis yielding 1.1 liters of pleural fluid.

## 2018-12-15 IMAGING — DX DG CHEST 1V
1 series · 1 of 1 positions shown · non-contrast
Comparison: Radiograph [DATE].

CLINICAL DATA: Status post thoracentesis.

EXAM:
CHEST  1 VIEW

[chest pa]
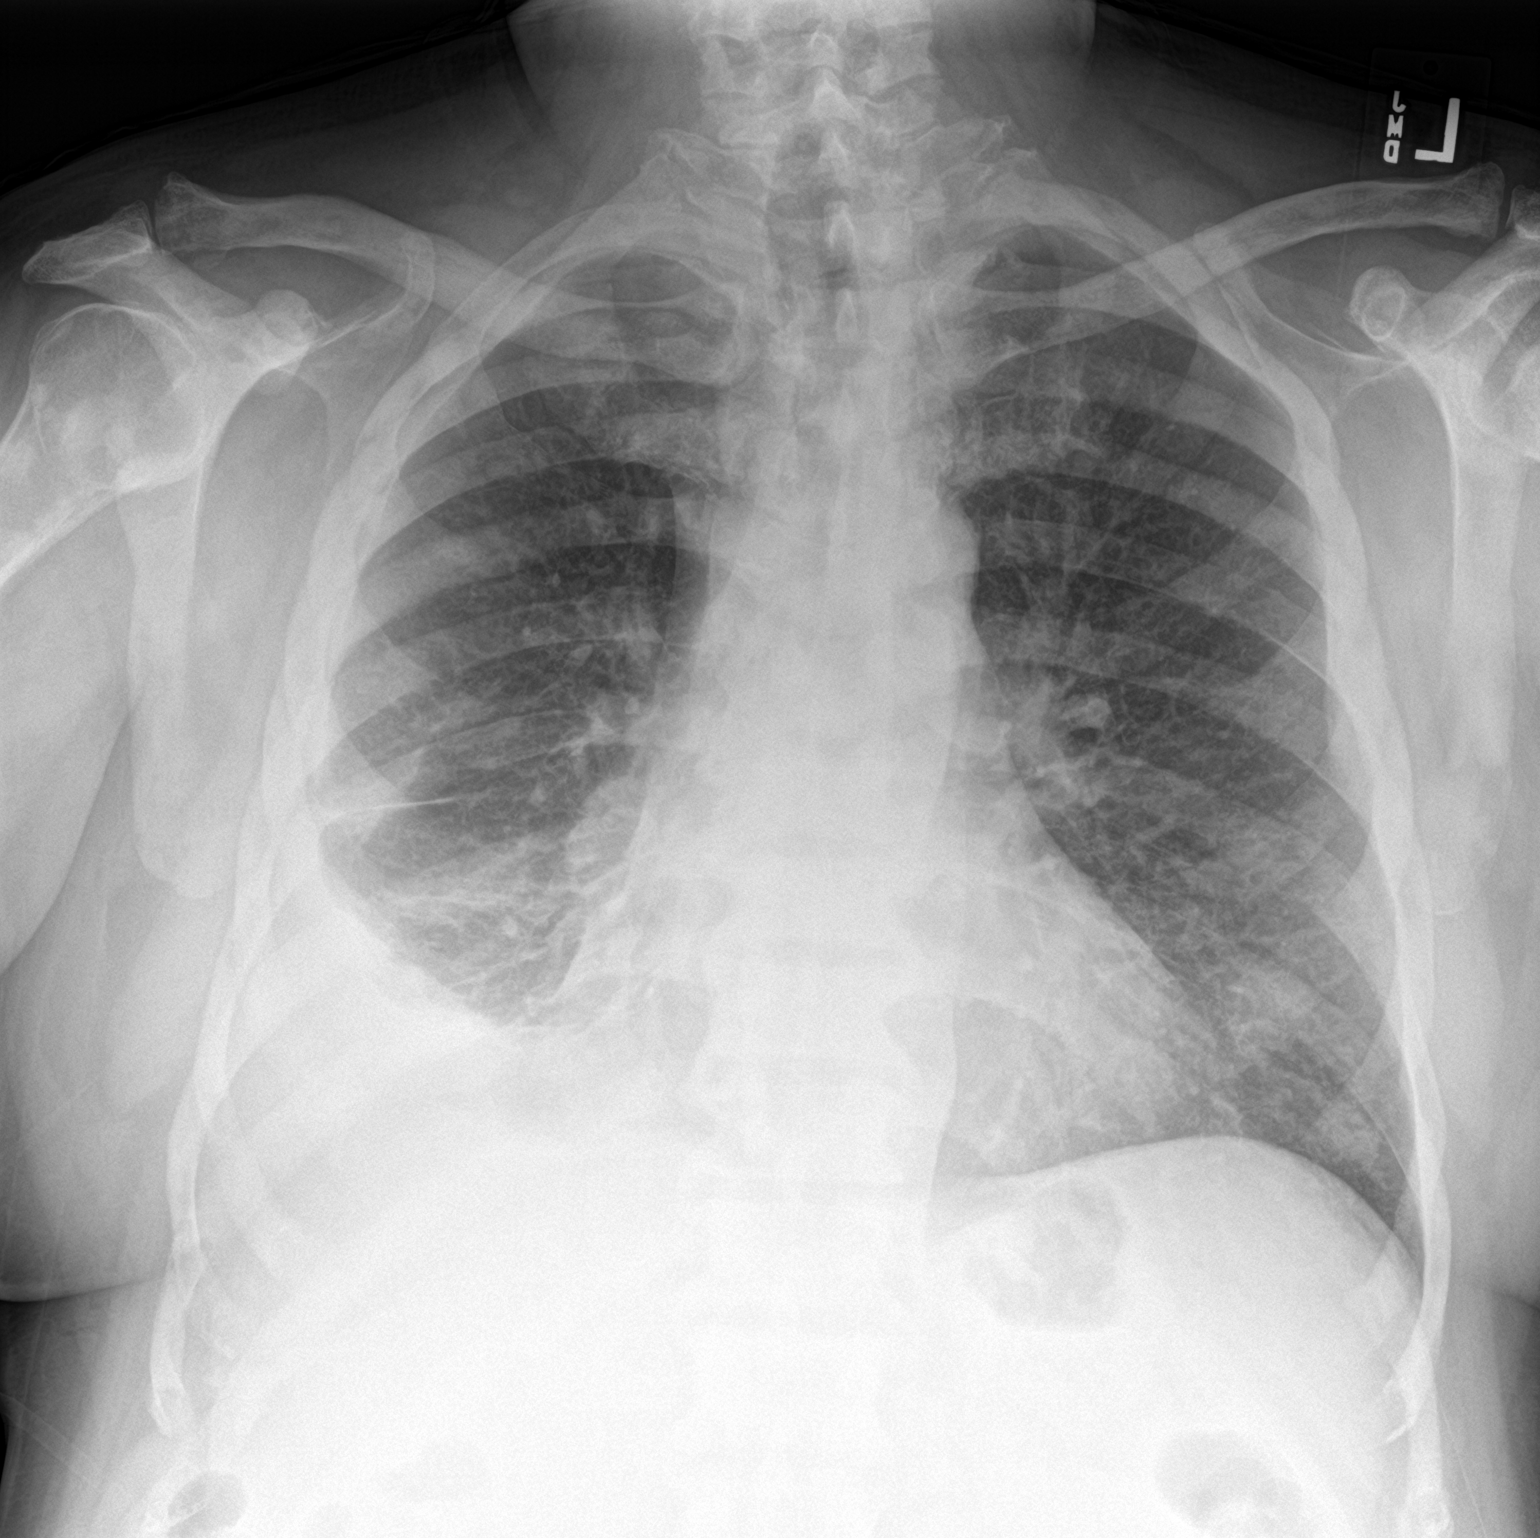

[1 of 1 positions shown; findings below may reference images not displayed]

FINDINGS: The heart size and mediastinal contours are within normal limits. No
pneumothorax is noted. Left lung is clear. Mild right pleural
effusion is noted which is decreased in size compared to prior exam.
Stable sclerosis of visualized skeleton is noted consistent with
metastatic disease.
IMPRESSION: Small right pleural effusion status post thoracentesis. No
pneumothorax is noted. Stable diffuse osseous metastases.

## 2018-12-15 MED ORDER — LIDOCAINE HCL 1 % IJ SOLN
INTRAMUSCULAR | Status: AC
  Filled 2018-12-15: qty 20

## 2018-12-15 MED FILL — HYDROCODON-APAP 5-325: 5-325 | 5 days supply | Qty: 40 | Fill #0

## 2018-12-15 NOTE — Procedures (Signed)
Ultrasound-guided diagnostic and therapeutic right thoracentesis performed yielding 1.1 liters of amber/blood-tinged  fluid. No immediate complications. Follow-up chest x-ray pending.The fluid was sent to the lab for cytology. EBL none. Due to pt chest discomfort and coughing only the above amount of fluid was removed today.

## 2018-12-16 NOTE — Progress Notes (Signed)
These preliminary result these preliminary results were noted.  Awaiting final report.

## 2018-12-17 ENCOUNTER — Other Ambulatory Visit: Payer: Self-pay | Admitting: Oncology

## 2018-12-23 ENCOUNTER — Other Ambulatory Visit: Payer: Self-pay | Admitting: Medical

## 2018-12-23 ENCOUNTER — Other Ambulatory Visit: Payer: Self-pay

## 2018-12-23 ENCOUNTER — Ambulatory Visit (HOSPITAL_COMMUNITY)
Admission: RE | Admit: 2018-12-23 | Discharge: 2018-12-23 | Disposition: A | Discharge status: Home / Self Care | Payer: Medicaid Other | Source: Ambulatory Visit | Attending: Medical | Admitting: Medical

## 2018-12-23 ENCOUNTER — Encounter (HOSPITAL_COMMUNITY)
Admission: RE | Admit: 2018-12-23 | Discharge: 2018-12-23 | Disposition: A | Discharge status: Home / Self Care | Payer: Medicaid Other | Source: Ambulatory Visit | Attending: Medical | Admitting: Medical

## 2018-12-23 ENCOUNTER — Inpatient Hospital Stay: Payer: Medicaid Other | Attending: Oncology

## 2018-12-23 DIAGNOSIS — M7918 Myalgia, other site: Secondary | ICD-10-CM | POA: Diagnosis present

## 2018-12-23 DIAGNOSIS — C61 Malignant neoplasm of prostate: Secondary | ICD-10-CM | POA: Insufficient documentation

## 2018-12-23 DIAGNOSIS — R600 Localized edema: Secondary | ICD-10-CM | POA: Diagnosis not present

## 2018-12-23 DIAGNOSIS — K8020 Calculus of gallbladder without cholecystitis without obstruction: Secondary | ICD-10-CM | POA: Diagnosis not present

## 2018-12-23 DIAGNOSIS — Z9079 Acquired absence of other genital organ(s): Secondary | ICD-10-CM | POA: Insufficient documentation

## 2018-12-23 DIAGNOSIS — R590 Localized enlarged lymph nodes: Secondary | ICD-10-CM | POA: Diagnosis not present

## 2018-12-23 DIAGNOSIS — C7951 Secondary malignant neoplasm of bone: Secondary | ICD-10-CM | POA: Diagnosis not present

## 2018-12-23 DIAGNOSIS — N1330 Unspecified hydronephrosis: Secondary | ICD-10-CM | POA: Diagnosis not present

## 2018-12-23 DIAGNOSIS — J90 Pleural effusion, not elsewhere classified: Secondary | ICD-10-CM | POA: Diagnosis not present

## 2018-12-23 DIAGNOSIS — Z79899 Other long term (current) drug therapy: Secondary | ICD-10-CM | POA: Diagnosis not present

## 2018-12-23 DIAGNOSIS — R140 Abdominal distension (gaseous): Secondary | ICD-10-CM | POA: Diagnosis not present

## 2018-12-23 LAB — CMP (CANCER CENTER ONLY)
ALT: 14 U/L (ref 0–44)
AST: 15 U/L (ref 15–41)
Albumin: 2.9 g/dL — ABNORMAL LOW (ref 3.5–5.0)
Alkaline Phosphatase: 448 U/L — ABNORMAL HIGH (ref 38–126)
Anion gap: 8 (ref 5–15)
BUN: 40 mg/dL — ABNORMAL HIGH (ref 6–20)
CO2: 31 mmol/L (ref 22–32)
Calcium: 8.5 mg/dL — ABNORMAL LOW (ref 8.9–10.3)
Chloride: 102 mmol/L (ref 98–111)
Creatinine: 2.02 mg/dL — ABNORMAL HIGH (ref 0.61–1.24)
GFR, Est AFR Am: 41 mL/min — ABNORMAL LOW (ref >60)
GFR, Estimated: 36 mL/min — ABNORMAL LOW (ref >60)
Glucose, Bld: 277 mg/dL — ABNORMAL HIGH (ref 70–99)
Potassium: 4.3 mmol/L (ref 3.5–5.1)
Sodium: 141 mmol/L (ref 135–145)
Total Bilirubin: 0.2 mg/dL — ABNORMAL LOW (ref 0.3–1.2)
Total Protein: 7.1 g/dL (ref 6.5–8.1)

## 2018-12-23 LAB — CBC WITH DIFFERENTIAL (CANCER CENTER ONLY)
Abs Immature Granulocytes: 0.13 10*3/uL — ABNORMAL HIGH (ref 0.00–0.07)
Basophils Absolute: 0 10*3/uL (ref 0.0–0.1)
Basophils Relative: 0 %
Eosinophils Absolute: 0.2 10*3/uL (ref 0.0–0.5)
Eosinophils Relative: 2 %
HCT: 33.3 % — ABNORMAL LOW (ref 39.0–52.0)
Hemoglobin: 10 g/dL — ABNORMAL LOW (ref 13.0–17.0)
Immature Granulocytes: 2 %
Lymphocytes Relative: 17 %
Lymphs Abs: 1.4 10*3/uL (ref 0.7–4.0)
MCH: 26.3 pg (ref 26.0–34.0)
MCHC: 30 g/dL (ref 30.0–36.0)
MCV: 87.6 fL (ref 80.0–100.0)
Monocytes Absolute: 0.6 10*3/uL (ref 0.1–1.0)
Monocytes Relative: 7 %
Neutro Abs: 5.6 10*3/uL (ref 1.7–7.7)
Neutrophils Relative %: 72 %
Platelet Count: 291 10*3/uL (ref 150–400)
RBC: 3.8 MIL/uL — ABNORMAL LOW (ref 4.22–5.81)
RDW: 17.5 % — ABNORMAL HIGH (ref 11.5–15.5)
WBC Count: 7.9 10*3/uL (ref 4.0–10.5)
nRBC: 0 % (ref 0.0–0.2)

## 2018-12-23 IMAGING — NM NM BONE WHOLE BODY
2 series · 2 of 2 positions shown · non-contrast
Comparison: Partial comparison to CT abdomen/pelvis dated
[DATE]

CLINICAL DATA: Prostate cancer

EXAM:
NUCLEAR MEDICINE WHOLE BODY BONE SCAN
TECHNIQUE: Whole body anterior and posterior images were obtained approximately
3 hours after intravenous injection of radiopharmaceutical.
RADIOPHARMACEUTICALS:  21 mCi [4P] MDP IV

[Series 1: whole body · 2.66mm/px · 1 of 1 slices shown (1 of 2)]
[im 1/1]
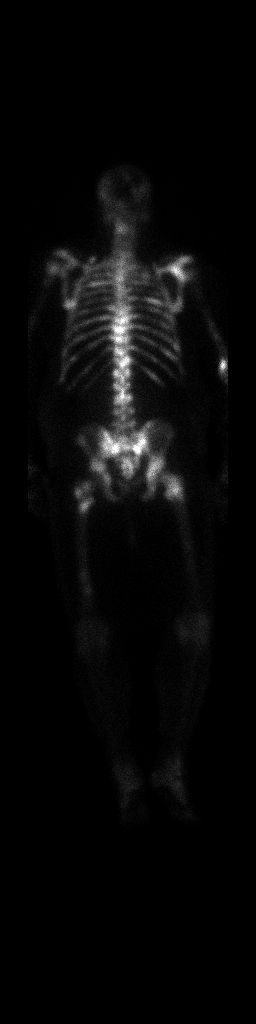

[Series 1: whole body · 2.66mm/px · 1 of 1 slices shown (2 of 2)]
[im 1/1]
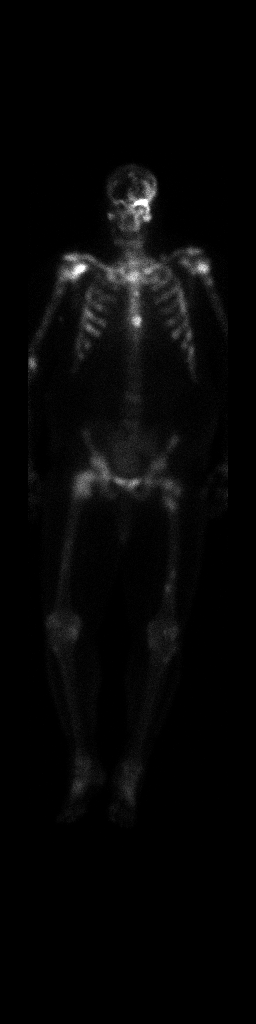

[2 of 2 positions shown; findings below may reference images not displayed]

FINDINGS: Super scan appearance with diffuse uptake throughout the visualized
thoracolumbar spine and lack of visualization of the bilateral
kidneys, reflecting widespread/diffuse osseous metastases.

More focal/heterogeneous uptake within the left superior
orbit/calvarium, multiple bilateral ribs, lower thoracic and lumbar
spine, bilateral pelvis, and bilateral hips.
IMPRESSION: Widespread/diffuse multifocal osseous metastases, as above.

## 2018-12-23 IMAGING — CT CT ABD-PELV W/O CM
2 of 4 series · 16 of 46 positions shown, 18 images · non-contrast
Comparison: [DATE].

CLINICAL DATA: Restaging metastatic prostate cancer.

EXAM:
CT ABDOMEN AND PELVIS WITHOUT CONTRAST
TECHNIQUE: Multidetector CT imaging of the abdomen and pelvis was performed
following the standard protocol without IV contrast.

[Series 2: axial st · axial · 0.95mm/px · z∈[-692,-217]mm · 13 of 105 slices shown, 15 images]
[im 5/105  soft-tissue]
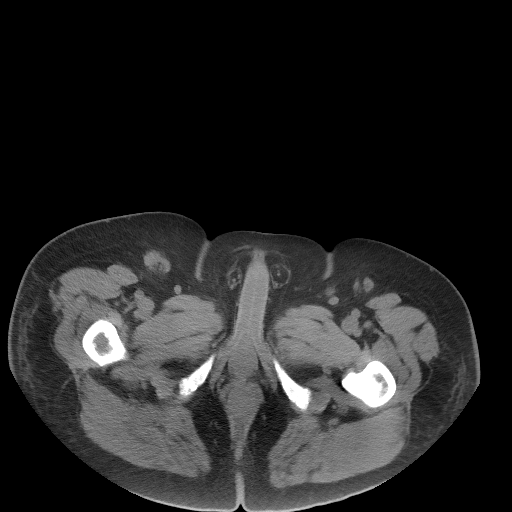
[im 5/105  bone]
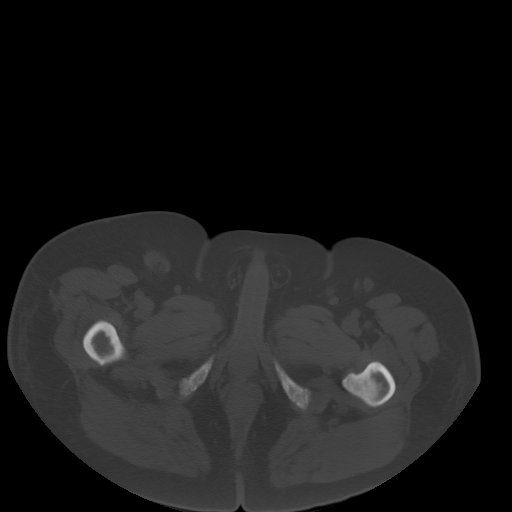
[im 14/105  soft-tissue]
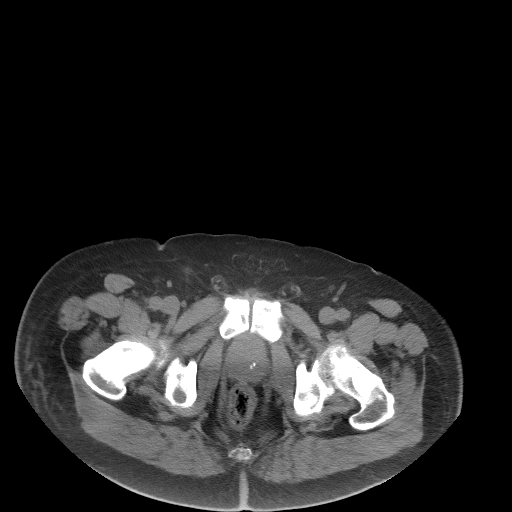
[im 22/105  soft-tissue]
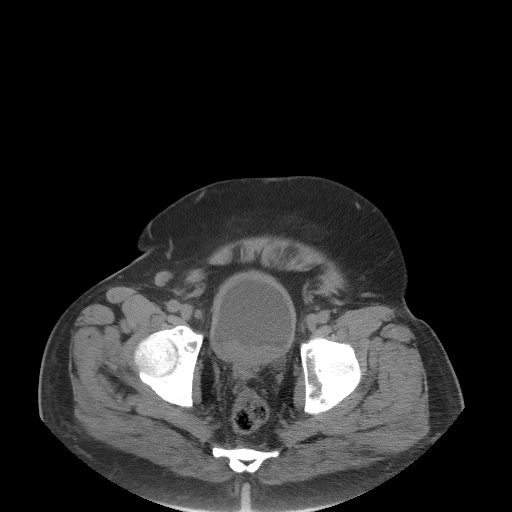
[im 31/105  soft-tissue]
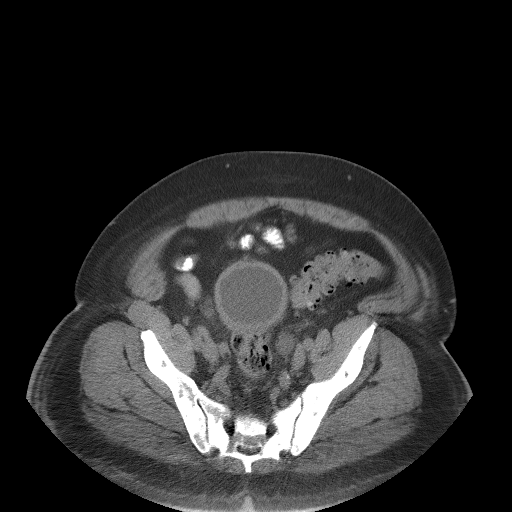
[im 35/105  soft-tissue]
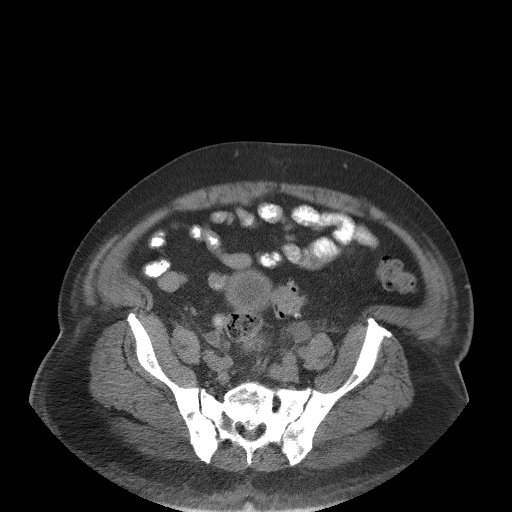
[im 44/105  soft-tissue]
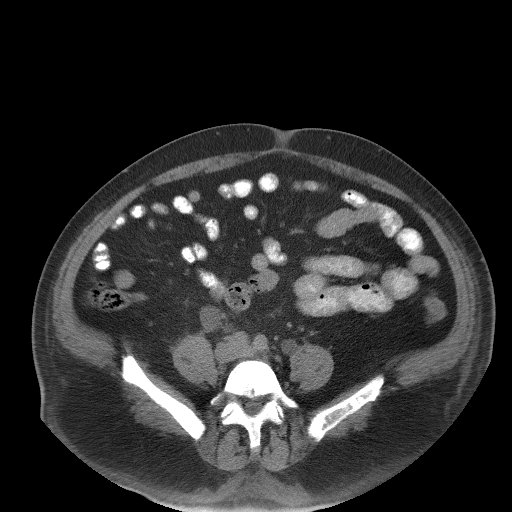
[im 53/105  soft-tissue]
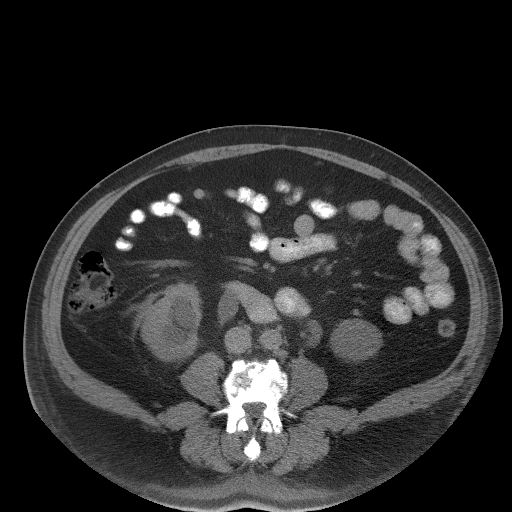
[im 61/105  soft-tissue]
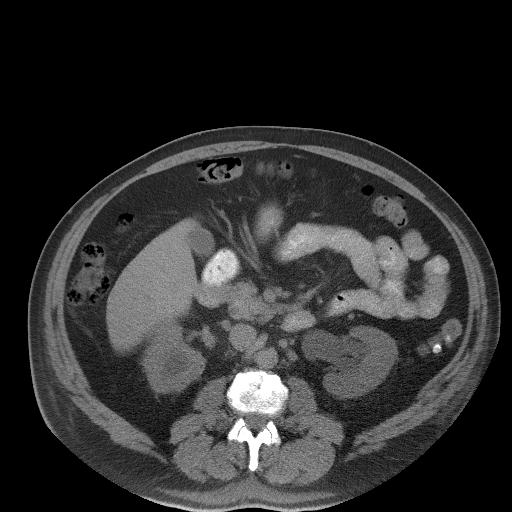
[im 70/105  soft-tissue]
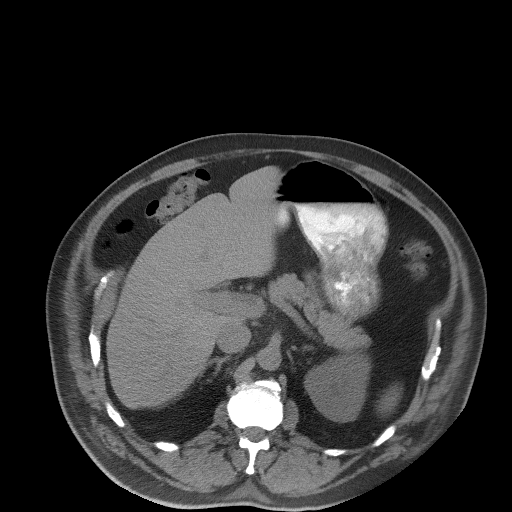
[im 70/105  bone]
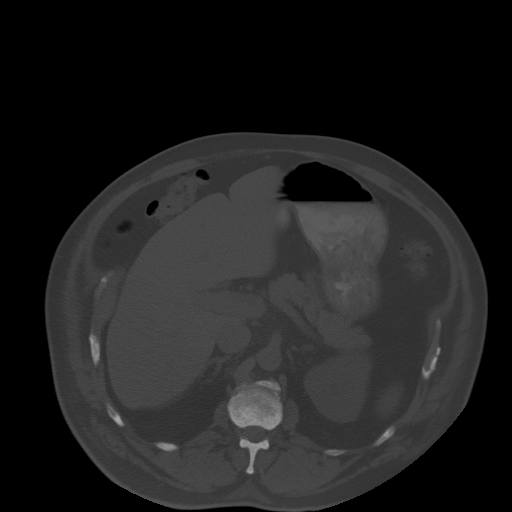
[im 74/105  soft-tissue]
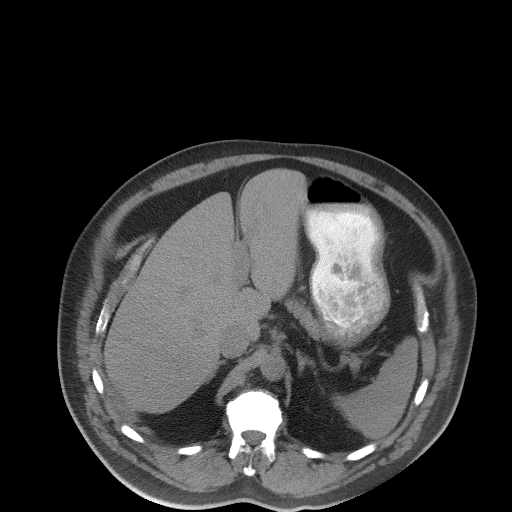
[im 83/105  soft-tissue]
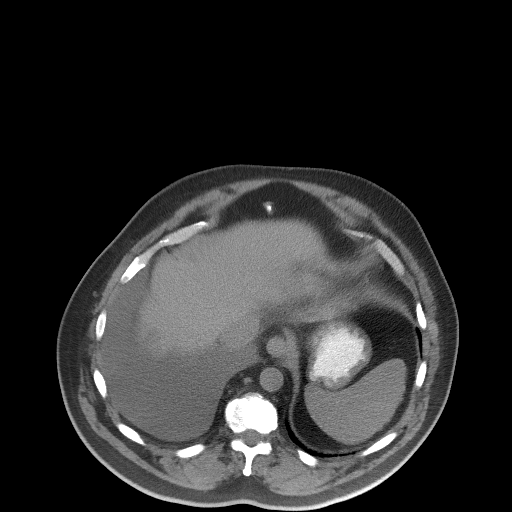
[im 92/105  soft-tissue]
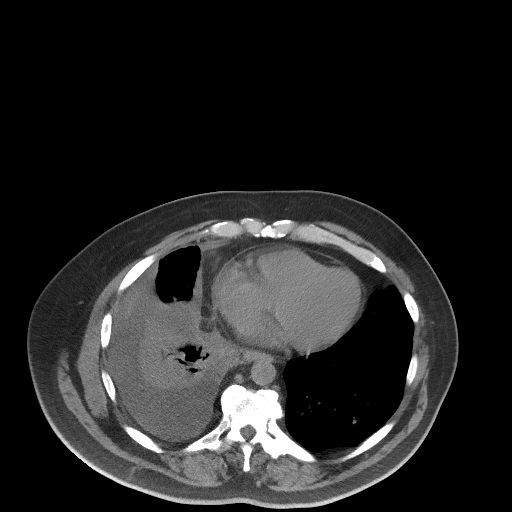
[im 100/105  soft-tissue]
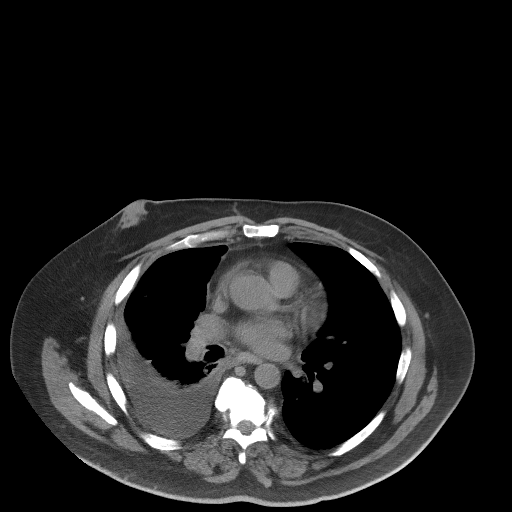

[Series 4: coronal st · coronal · 1.06mm/px · 3 of 116 slices shown]
[im 39/116  soft-tissue]
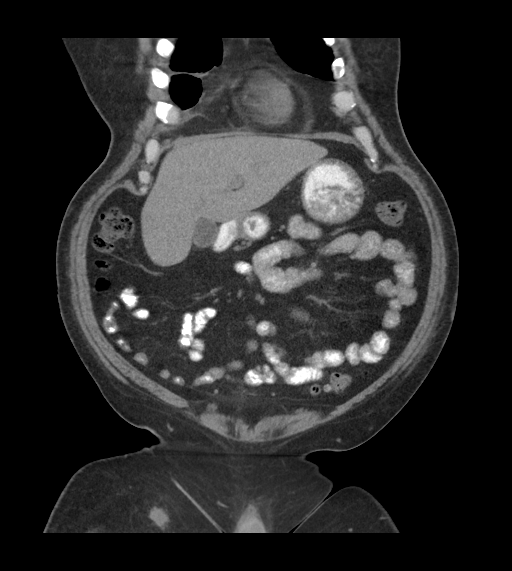
[im 52/116  soft-tissue]
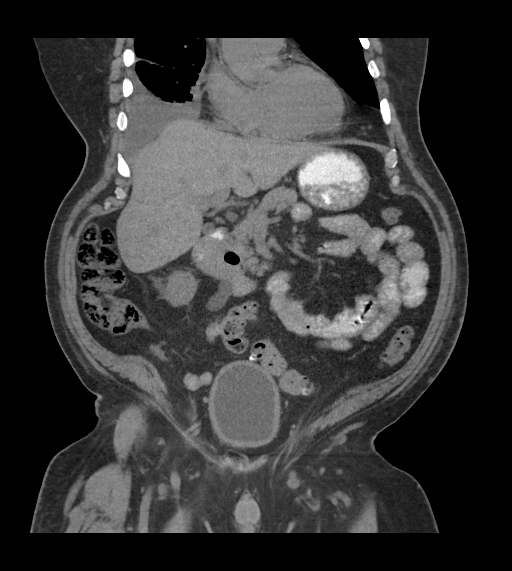
[im 64/116  soft-tissue]
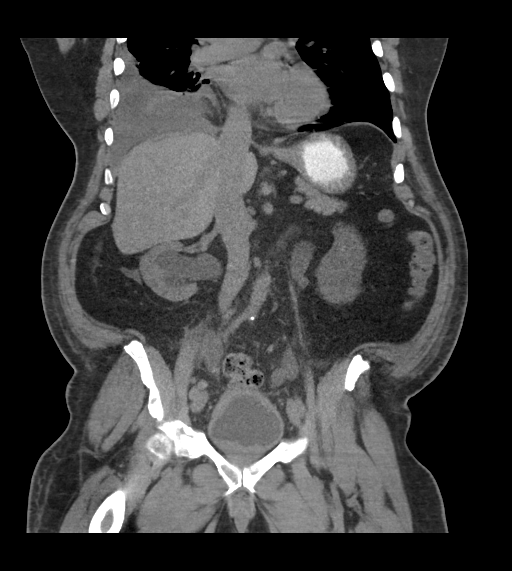

[16 of 46 positions shown; findings below may reference images not displayed]

FINDINGS: Lower chest: New moderate right pleural effusion with overlying
airspace consolidation.

Hepatobiliary: No focal liver abnormality. Gallstone identified
within the cystic duct measuring 4 mm. No gallbladder distention,
wall thickening or inflammation. No biliary ductal dilatation.

Pancreas: Unremarkable. No pancreatic ductal dilatation or
surrounding inflammatory changes.

Spleen: Normal in size without focal abnormality.

Adrenals/Urinary Tract: Normal appearance of the adrenal glands.
There is marked bilateral hydronephrosis and hydroureter, unchanged.
No urinary tract calculi identified. Soft tissue mass at the right
UVJ measures 3.4 x 2.2 cm, image 82/2, with approximately 7 cm of
retrograde extension into the distal right ureter, image 60/5. Soft
tissue mass contiguous with the prostate gland and involving the
bladder base measures 6.2 x 2.7 cm, image 84/2. This obstructs the
left UVJ.

Stomach/Bowel: Scattered distal colonic diverticulosis without acute
inflammation.

Vascular/Lymphatic: Interval improvement in retroperitoneal
adenopathy.

-left periaortic node measures 0.7 cm, image 42/2. Previously
cm.

-distal left periaortic node measures 0.7 cm, image 54/2. Previously
1.3 cm. Index left external iliac node measures 0.7 cm, image 80/2.
Previously 1.6 cm. Index right inguinal lymph node measures 1.7 cm,
image 103/2. Unchanged from previous exam.

Reproductive: Similar appearance of prostate gland enlargement.

Other: No free fluid or fluid collections.

Musculoskeletal: Significant interval progression of widespread
sclerotic bone metastases.
IMPRESSION: 1. Interval mixed response to therapy.
2. Marked progression of widespread sclerotic bone metastases.
3. Improvement in retroperitoneal and pelvic adenopathy
4. Unchanged tumor within the bladder base which obstructs the left
UVJ and invades the distal right ureter.
5. Persistent high-grade, bilateral hydronephrosis secondary to
bladder base tumor.
6. Gallstone has migrated into the cystic duct.

## 2018-12-23 MED ORDER — TECHNETIUM TC 99M MEDRONATE IV KIT
21.0000 | PACK | Freq: Once | INTRAVENOUS | Status: AC | PRN
  Administered 2018-12-23: 21 via INTRAVENOUS

## 2018-12-23 NOTE — Progress Notes (Signed)
These preliminary result these preliminary results were noted.  Awaiting final report.

## 2018-12-24 LAB — PROSTATE-SPECIFIC AG, SERUM (LABCORP): Prostate Specific Ag, Serum: 33.3 ng/mL — ABNORMAL HIGH (ref 0.0–4.0)

## 2018-12-29 ENCOUNTER — Inpatient Hospital Stay (HOSPITAL_BASED_OUTPATIENT_CLINIC_OR_DEPARTMENT_OTHER): Payer: Medicaid Other | Admitting: Oncology

## 2018-12-29 ENCOUNTER — Other Ambulatory Visit: Payer: Self-pay

## 2018-12-29 VITALS — BP 132/86 | HR 95 | Temp 98.0°F | Resp 18 | Ht 69.0 in | Wt 261.9 lb

## 2018-12-29 DIAGNOSIS — C61 Malignant neoplasm of prostate: Secondary | ICD-10-CM

## 2018-12-29 NOTE — Progress Notes (Signed)
Hematology and Oncology Follow Up Visit  [name] [id number] 10/05/1960 58 y.o. 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  In June 2020.   Prior Therapy: He is status post biopsy obtained on 09/17/2018 for his prostate performed by Dr. Gloriann Loan.  This showed a Gleason score of 9 and 12 cores.  He is status post orchiectomy completed on October 12, 2018.   Current therapy: Zytiga 1000 mg daily started in August 2020.  Interim History: Mr. [name] returns today for a follow-up visit.  Since the last visit, he started taking Zytiga although has experienced few complications.  He has reported fluid retention with predominantly lower extremity edema, slight abdominal distention and pleural effusion.  He did require thoracentesis with improvement in his respiratory status.  Fluid cytology did not show any malignancy.  Otherwise he has tolerated Zytiga reasonably well and was prescribed Lasix by Dr. Justin Mend which she has been taking regularly.  He denies any bone pain or pathological fractures.  He denies any recent hospitalizations.   He denied any alteration mental status, neuropathy, confusion or dizziness.  Denies any headaches or lethargy.  Denies any night sweats, weight loss or changes in appetite.  Denied orthopnea, dyspnea on exertion or chest discomfort.  Denies shortness of breath, difficulty breathing hemoptysis or cough.  Denies any abdominal distention, nausea, early satiety or dyspepsia.  Denies any hematuria, frequency, dysuria or nocturia.  Denies any skin irritation, dryness or rash.  Denies any ecchymosis or petechiae.  Denies any lymphadenopathy or clotting.  Denies any heat or cold intolerance.  Denies any anxiety or depression.  Remaining review of system is  negative.     Medications: Updated on review today. Current Outpatient Medications  Medication Sig Dispense Refill  . Amino Acids-Protein Hydrolys (FEEDING SUPPLEMENT, PRO-STAT SUGAR FREE 64,) LIQD Take 30 mLs by mouth 3 (three) times daily. 887 mL 0  . Blood Glucose Monitoring Suppl (RELION CONFIRM GLUCOSE MONITOR) w/Device KIT Use 4 times daily before meals and bedtime as directed. 1 kit 0  . feeding supplement, ENSURE ENLIVE, (ENSURE ENLIVE) LIQD Take 237 mLs by mouth daily. 237 mL 12  . finasteride (PROSCAR) 5 MG tablet Take 5 mg by mouth daily.    . furosemide (LASIX) 40 MG tablet Take 40 mg by mouth daily.    [name] gabapentin (NEURONTIN) 300 MG capsule Take 1 capsule (300 mg total) by mouth 2 (two) times daily. 60 capsule 1  . glucose blood (RELION GLUCOSE TEST STRIPS) test strip Use as instructed 100 each 12  . HYDROcodone-acetaminophen (NORCO/VICODIN) 5-325 MG tablet TAKE 1 TO 2 TABLETS BY MOUTH EVERY 4 TO 6 HOURS AS NEEDED FOR PAIN . DO NOT EXCEED 8 PER 24 HOURS 40 tablet 0  . Lancets 30G MISC Use 4 times daily before meals as directed. 100 each 0  . Multiple Vitamin (MULTIVITAMIN WITH MINERALS) TABS tablet Take 1 tablet by mouth daily.    . predniSONE (DELTASONE) 5 MG tablet Take 1 tablet (5 mg total) by mouth daily with breakfast. 90 tablet 3  . tamsulosin (FLOMAX) 0.4 MG CAPS capsule Take 2 capsules (0.8 mg total) by mouth daily after supper.    . vitamin B-12 (CYANOCOBALAMIN) 1000 MCG tablet Take 1 tablet (1,000 mcg total) by mouth daily. 30 tablet 0  . ZYTIGA 250 MG  tablet TAKE 4 TABLETS BY MOUTH ONCE DAILY AS DIRECTED.  TAKE 1 HOUR BEFORE OR 2 HOURS AFTER A MEAL 120 tablet 10   No current facility-administered medications for this visit.      Allergies: No Known Allergies  Past Medical History, Surgical history, Social history, and Family History unchanged on review.   Physical Exam: Blood pressure 132/86, pulse 95, temperature 98 F (36.7 C), temperature source Oral,  resp. rate 18, height _0  (1.753 m), weight 261 lb 14.4 oz (118.8 kg), SpO2 94 %.    ECOG: 1   General appearance: Comfortable appearing without any discomfort Head: Normocephalic without any trauma Oropharynx: Mucous membranes are moist and pink without any thrush or ulcers. Eyes: Pupils are equal and round reactive to light. Lymph nodes: No cervical, supraclavicular, inguinal or axillary lymphadenopathy.   Heart:regular rate and rhythm.  S1 and S2 with bilateral ankle edema. Lung: Clear without any rhonchi or wheezes.  No dullness to percussion. Abdomin: Soft, slightly distended with good bowel sounds.  Gas can taste Musculoskeletal: No joint deformity or effusion.  Full range of motion noted. Neurological: No deficits noted on motor, sensory and deep tendon reflex exam. Skin: No petechial rash or dryness.  Appeared moist.      Lab Results: Lab Results  Component Value Date   WBC 7.9 12/23/2018   HGB 10.0 (L) 12/23/2018   HCT 33.3 (L) 12/23/2018   MCV 87.6 12/23/2018   PLT 291 12/23/2018     Chemistry      Component Value Date/Time   NA 141 12/23/2018 1131   K 4.3 12/23/2018 1131   CL 102 12/23/2018 1131   CO2 31 12/23/2018 1131   BUN 40 (H) 12/23/2018 1131   CREATININE 2.02 (H) 12/23/2018 1131      Component Value Date/Time   CALCIUM 8.5 (L) 12/23/2018 1131   ALKPHOS 448 (H) 12/23/2018 1131   AST 15 12/23/2018 1131   ALT 14 12/23/2018 1131   BILITOT 0.2 (L) 12/23/2018 1131      Results for DAUNDRE, BIEL (MRN [id number]) as of 12/29/2018 12:24  Ref. Range 12/23/2018 11:31  Prostate Specific Ag, Serum Latest Ref Range: 0.0 - 4.0 ng/mL 33.3 (H)    EXAM: CT ABDOMEN AND PELVIS WITHOUT CONTRAST  TECHNIQUE: Multidetector CT imaging of the abdomen and pelvis was performed following the standard protocol without IV contrast.  COMPARISON:  10/12/2018.  FINDINGS: Lower chest: New moderate right pleural effusion with overlying airspace  consolidation.  Hepatobiliary: No focal liver abnormality. Gallstone identified within the cystic duct measuring 4 mm. No gallbladder distention, wall thickening or inflammation. No biliary ductal dilatation.  Pancreas: Unremarkable. No pancreatic ductal dilatation or surrounding inflammatory changes.  Spleen: Normal in size without focal abnormality.  Adrenals/Urinary Tract: Normal appearance of the adrenal glands. There is marked bilateral hydronephrosis and hydroureter, unchanged. No urinary tract calculi identified. Soft tissue mass at the right UVJ measures 3.4 x 2.2 cm, image 82/2, with approximately 7 cm of retrograde extension into the distal right ureter, image 60/5. Soft tissue mass contiguous with the prostate gland and involving the bladder base measures 6.2 x 2.7 cm, image 84/2. This obstructs the left UVJ.  Stomach/Bowel: Scattered distal colonic diverticulosis without acute inflammation.  Vascular/Lymphatic: Interval improvement in retroperitoneal adenopathy.  -left periaortic node measures 0.7 cm, image 42/2. Previously 1.5 cm.  -distal left periaortic node measures 0.7 cm, image 54/2. Previously 1.3 cm. Index left external iliac node measures 0.7 cm, image 80/2. Previously 1.6 cm.  Index right inguinal lymph node measures 1.7 cm, image 103/2. Unchanged from previous exam.  Reproductive: Similar appearance of prostate gland enlargement.  Other: No free fluid or fluid collections.  Musculoskeletal: Significant interval progression of widespread sclerotic bone metastases.  IMPRESSION: 1. Interval mixed response to therapy. 2. Marked progression of widespread sclerotic bone metastases. 3. Improvement in retroperitoneal and pelvic adenopathy 4. Unchanged tumor within the bladder base which obstructs the left UVJ and invades the distal right ureter. 5. Persistent high-grade, bilateral hydronephrosis secondary to bladder base tumor. 6. Gallstone  has migrated into the cystic duct.   Impression and Plan:   58 year old man with:  1. Cancer castration-sensitive prostate cancer with disease to the bone and lymphadenopathy diagnosed in June 2020.    He is currently on Zytiga with few complications noted.  The natural course of his disease and treatment options were reiterated.  This included review of his laboratory testing as well as CT scan and bone scan obtained on 12/23/2018.  He has clear improvement in his lymphadenopathy as well as decline in his PSA which is consistent with improvement of his disease.  The progression of a sclerotic bony metastasis is likely related to treatment effect rather than disease progression.  Risks and benefits of continuing Zytiga versus switching to different therapies was discussed today.  Potential complications associated with alternative options were reviewed.  After discussion today as he opted to continue with Zytiga and will reevaluate in 4 weeks and will make a decision regarding continuing or switching therapy.   2.  Androgen deprivation therapy: He is status post orchiectomy with no additional androgen deprivation is needed.  3.  Bone directed therapy: I recommended calcium and vitamin D supplements and deferring Xgeva at this time until he obtains dental clearance.  4.  Prognosis and goals of care: This was discussed today in detail.  He has an incurable malignancy with disease that is can be palliated but cannot be cured.  Given his age and reasonable performance status aggressive measures are warranted.  5.  Decline in his performance status and ability to perform work-related duties: I anticipate potential decline in his ability to perform work-related duties and will be permanently disabled because of his malignancy.  6.  Fluid retention: He is experiencing edema in his lower extremity as well as pleural effusion.  His could be related to Zytiga I offered him thoracentesis and switching  Zytiga at this time.  He declined this option for the time being we will continue with Lasix prescribed by Dr. Justin Mend.  We will continue to monitor clinically.  7. Follow-up: In 4 to 6 weeks for repeat evaluation.  25  minutes was spent with the patient face-to-face today.  More than 50% of time was dedicated to reviewing his disease status, treatment options and complications related to therapy.    Zola Button, MD 9/15/202012:18 PM

## 2018-12-31 ENCOUNTER — Telehealth: Payer: Self-pay | Admitting: Oncology

## 2018-12-31 NOTE — Telephone Encounter (Signed)
Called and spoke with patient. Confirmed appts  °

## 2019-01-01 ENCOUNTER — Other Ambulatory Visit: Payer: Self-pay | Admitting: Oncology

## 2019-01-01 DIAGNOSIS — M7918 Myalgia, other site: Secondary | ICD-10-CM

## 2019-01-01 MED FILL — HYDROCODON-APAP 5-325: 5-325 | 5 days supply | Qty: 40 | Fill #0

## 2019-01-11 ENCOUNTER — Telehealth: Payer: Self-pay

## 2019-01-11 MED FILL — GABAPENTIN 300 MG CAPSULE: 300 | 30 days supply | Qty: 90 | Fill #1

## 2019-01-11 NOTE — Telephone Encounter (Signed)
V/M message from Pt. Stating that he was prescribed Gabapentin from [employer] [facility]. Pt stated that he has changed from 2 -3 tablets and he is still having pain. Per Lucianne Lei message forwarded to Dr. Alen Blew In basket sent.

## 2019-01-12 ENCOUNTER — Other Ambulatory Visit (HOSPITAL_COMMUNITY): Payer: Self-pay | Admitting: Nephrology

## 2019-01-12 ENCOUNTER — Other Ambulatory Visit: Payer: Self-pay

## 2019-01-12 ENCOUNTER — Telehealth: Payer: Self-pay

## 2019-01-12 DIAGNOSIS — C61 Malignant neoplasm of prostate: Secondary | ICD-10-CM

## 2019-01-12 DIAGNOSIS — R339 Retention of urine, unspecified: Secondary | ICD-10-CM

## 2019-01-12 DIAGNOSIS — M7918 Myalgia, other site: Secondary | ICD-10-CM

## 2019-01-12 DIAGNOSIS — N183 Chronic kidney disease, stage 3 unspecified: Secondary | ICD-10-CM

## 2019-01-12 DIAGNOSIS — G629 Polyneuropathy, unspecified: Secondary | ICD-10-CM

## 2019-01-12 MED ORDER — GABAPENTIN 300 MG PO CAPS: 300.0000 mg | ORAL_CAPSULE | Freq: Three times a day (TID) | ORAL | 1 refills | Status: DC

## 2019-01-12 NOTE — Telephone Encounter (Signed)
-----   Message from Jeffery Portela, MD sent at 01/12/2019  6:53 AM EDT ----- Regarding: RE: Patient Advice/Refill Request Ok to refill Gabapentin with change to 300 mg tid. No other changes for now. Thanks.  ----- Message ----- From: [name], RN Sent: 01/11/2019  11:10 AM EDT To: Jeffery Portela, MD Subject: Patient Advice/Refill Request                  Patient called office stating he has been having R hip pain (10/10), radiating to R knee and R foot. Patient previously saw Sandi Mealy, PA on 11/19/18 for same complaint. Patient prescribed gabapentin 300mg  2 times daily. Patient stated he had to increase dosage to 3 pills daily to help with pain.  Patient has stated that pain has started again Friday (01/08/19) and that the gabapentin is no longer working. Patient states he also takes hydrocodone 5-325 but "this is for different pain". Patient is requesting refill of gabapentin or "something stronger to help with the pain". Please advise.

## 2019-01-12 NOTE — Telephone Encounter (Signed)
Patient made aware that Gabapentin refill has been sent to [facility]. Verbalized understanding.

## 2019-01-13 ENCOUNTER — Other Ambulatory Visit: Payer: Self-pay | Admitting: Nephrology

## 2019-01-13 DIAGNOSIS — N183 Chronic kidney disease, stage 3 unspecified: Secondary | ICD-10-CM

## 2019-01-15 ENCOUNTER — Other Ambulatory Visit: Payer: Self-pay | Admitting: Oncology

## 2019-01-15 DIAGNOSIS — M7918 Myalgia, other site: Secondary | ICD-10-CM

## 2019-01-15 MED FILL — HYDROCODON-APAP 5-325: 5-325 | 5 days supply | Qty: 40 | Fill #0

## 2019-01-18 ENCOUNTER — Ambulatory Visit (HOSPITAL_COMMUNITY)
Admission: RE | Admit: 2019-01-18 | Discharge: 2019-01-18 | Disposition: A | Discharge status: Home / Self Care | Payer: Medicaid Other | Source: Ambulatory Visit | Attending: Nephrology | Admitting: Nephrology

## 2019-01-18 ENCOUNTER — Ambulatory Visit (HOSPITAL_COMMUNITY): Payer: Medicaid Other

## 2019-01-18 ENCOUNTER — Other Ambulatory Visit: Payer: Self-pay

## 2019-01-18 DIAGNOSIS — R339 Retention of urine, unspecified: Secondary | ICD-10-CM | POA: Insufficient documentation

## 2019-01-18 DIAGNOSIS — N183 Chronic kidney disease, stage 3 unspecified: Secondary | ICD-10-CM | POA: Insufficient documentation

## 2019-01-18 DIAGNOSIS — C61 Malignant neoplasm of prostate: Secondary | ICD-10-CM | POA: Diagnosis present

## 2019-01-18 IMAGING — US US ABDOMEN COMPLETE
1 series · 13 of 25 positions shown · non-contrast
Comparison: CT scan [DATE]
COMPARISON: CT scan [DATE]

Addendum:
CLINICAL DATA: Chronic kidney disease.

EXAM:
ABDOMEN ULTRASOUND COMPLETE

[Series 1: us abdomen complete · 13 of 79 slices shown]
[im 1/79]
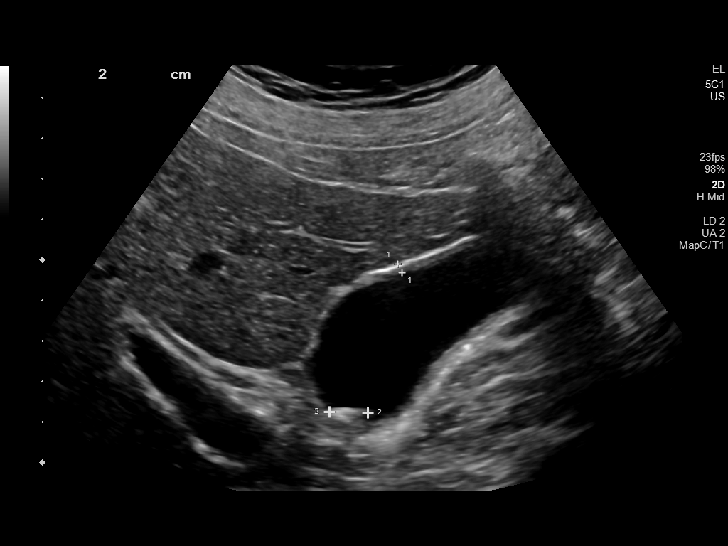
[im 7/79]
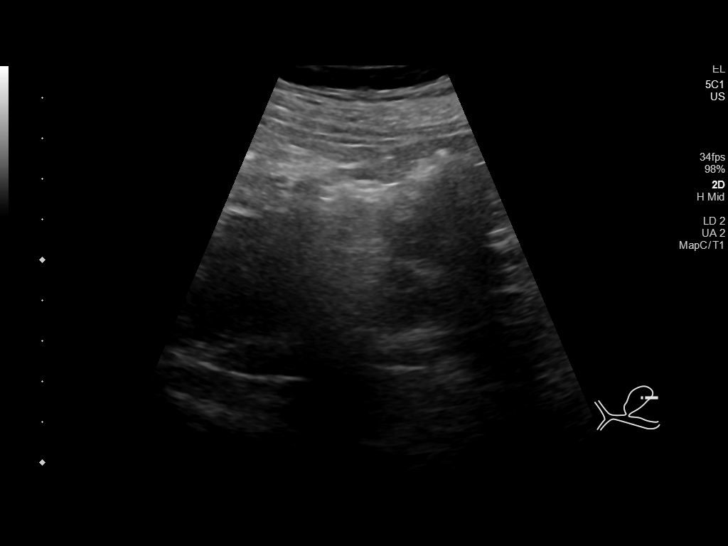
[im 14/79]
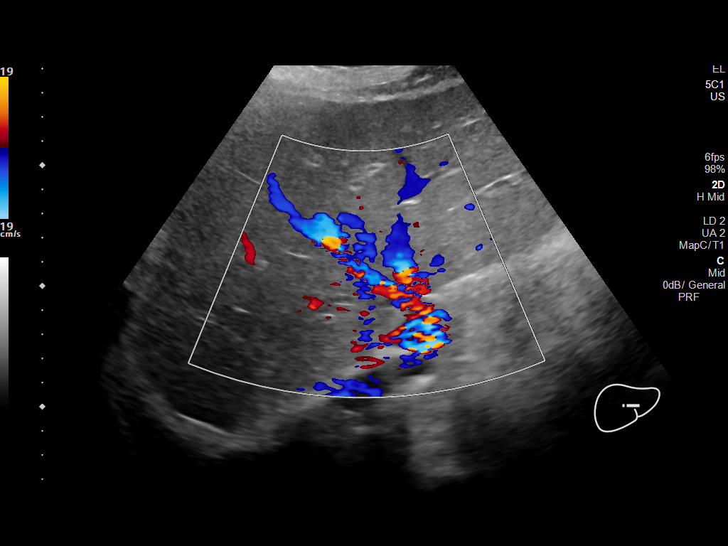
[im 20/79]
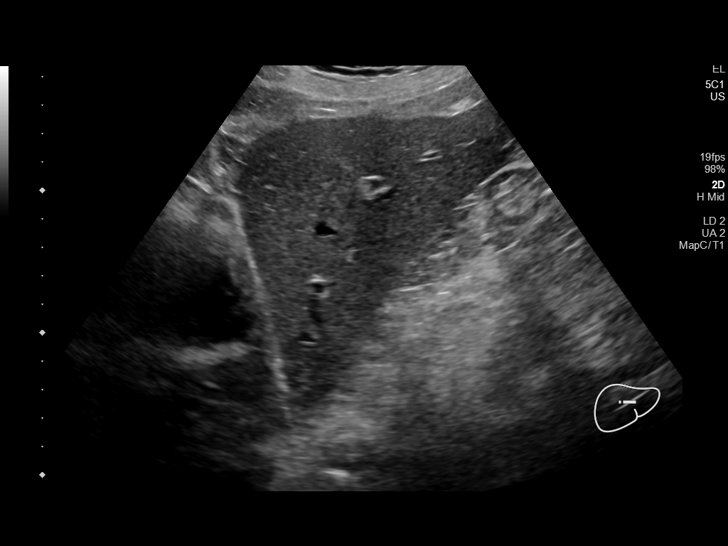
[im 27/79]
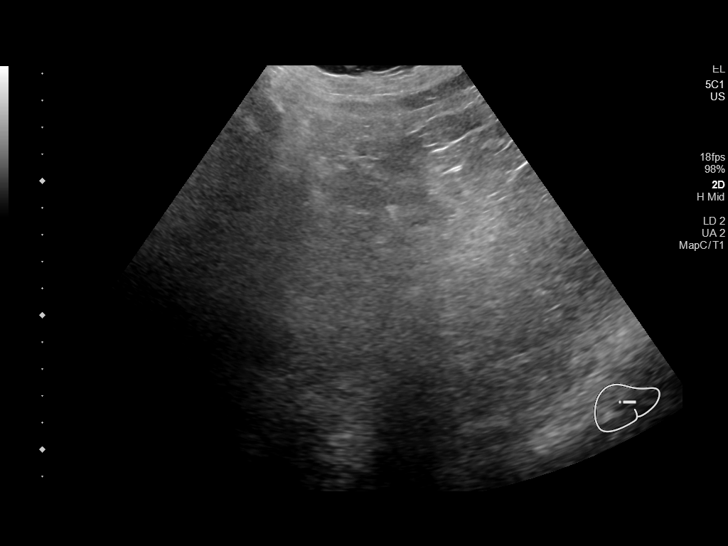
[im 33/79]
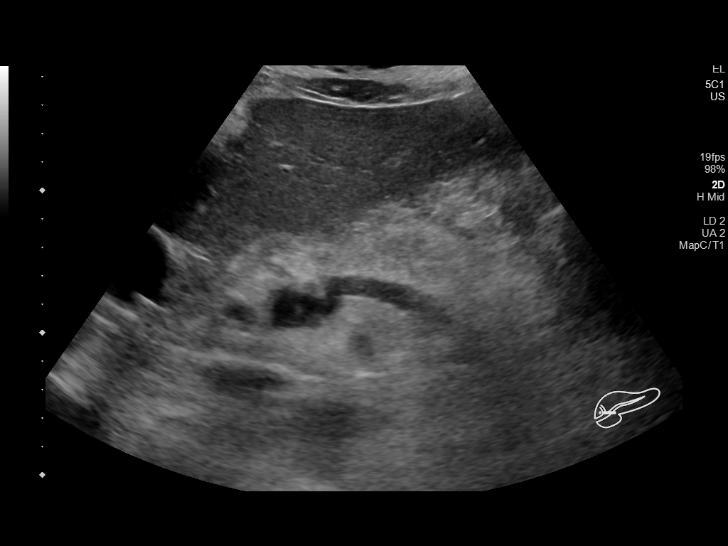
[im 40/79]
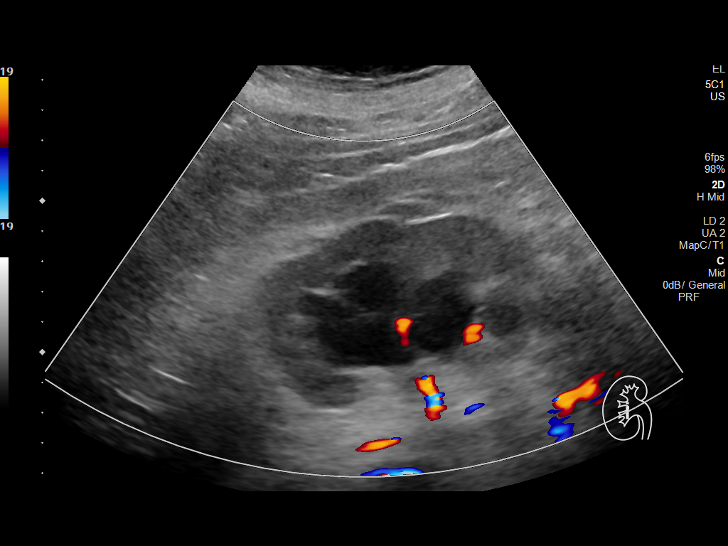
[im 46/79]
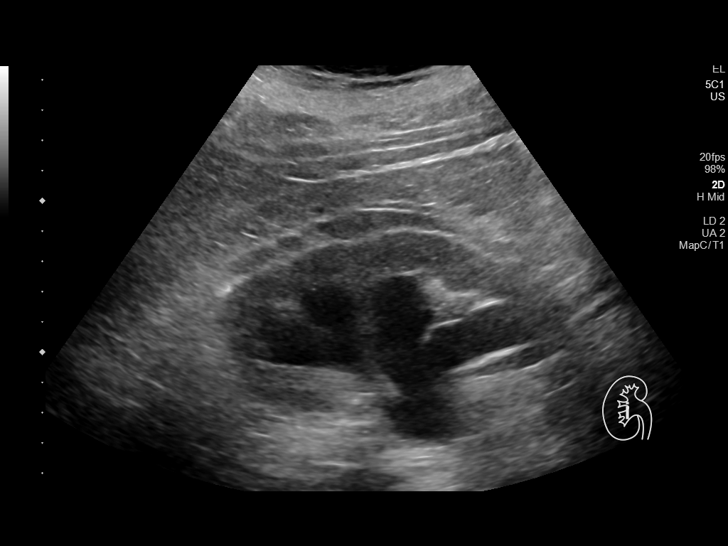
[im 53/79]
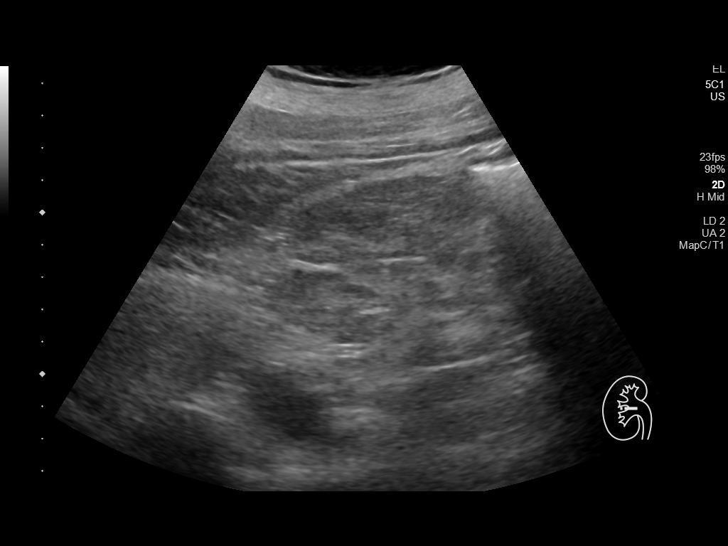
[im 59/79]
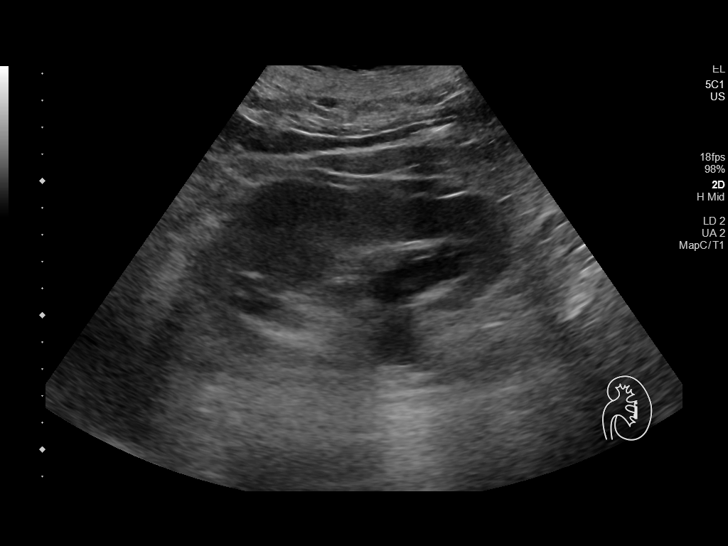
[im 66/79]
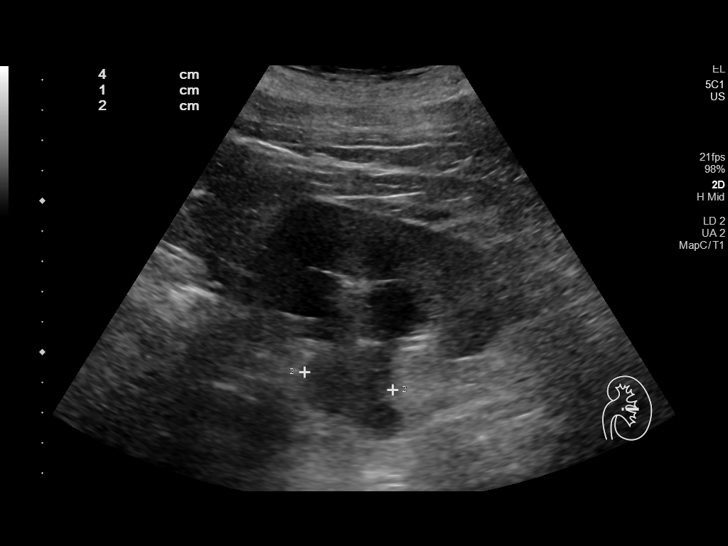
[im 72/79]
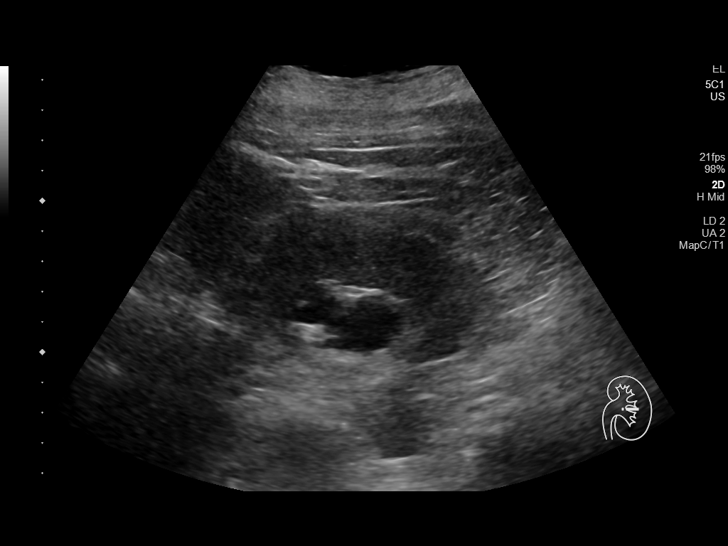
[im 79/79]
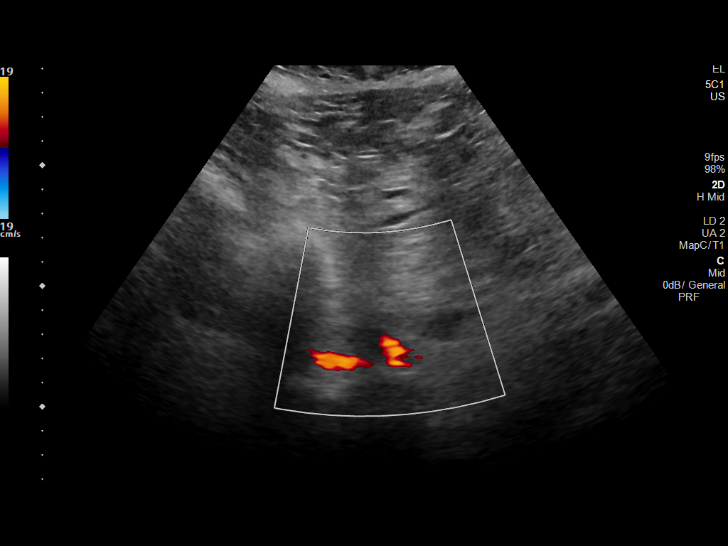

[13 of 25 positions shown; findings below may reference images not displayed]

FINDINGS: Gallbladder: Gallstones identified measuring up to 1 cm. Gallbladder
wall thickness at 2-3 mm is within normal limits. Sonographer
reports no sonographic Murphy sign.

Common bile duct: Diameter: 9 mm

Liver: No focal lesion identified. Within normal limits in
parenchymal echogenicity. Portal vein is patent on color Doppler
imaging with normal direction of blood flow towards the liver.

IVC: No abnormality visualized.

Pancreas: Visualized portion unremarkable.

Spleen: Size and appearance within normal limits.

Right Kidney: Length: 11.2 cm.  Hydronephrosis, similar to prior CT.

Left Kidney: Length: 14 cm.  Hydronephrosis, similar to prior CT.

Abdominal aorta: No aneurysm visualized.

Other findings: None.
IMPRESSION: Bilateral hydronephrosis, similar to CT of [DATE].

Cholelithiasis.

ADDENDUM:
As noted in the body of the report, the common bile duct diameter is
9 mm. Correlation with liver function test recommended.

*** End of Addendum ***
FINDINGS: Gallbladder: Gallstones identified measuring up to 1 cm. Gallbladder
wall thickness at 2-3 mm is within normal limits. Sonographer
reports no sonographic Murphy sign.

Common bile duct: Diameter: 9 mm

Liver: No focal lesion identified. Within normal limits in
parenchymal echogenicity. Portal vein is patent on color Doppler
imaging with normal direction of blood flow towards the liver.

IVC: No abnormality visualized.

Pancreas: Visualized portion unremarkable.

Spleen: Size and appearance within normal limits.

Right Kidney: Length: 11.2 cm.  Hydronephrosis, similar to prior CT.

Left Kidney: Length: 14 cm.  Hydronephrosis, similar to prior CT.

Abdominal aorta: No aneurysm visualized.

Other findings: None.
IMPRESSION: Bilateral hydronephrosis, similar to CT of [DATE].

Cholelithiasis.

## 2019-01-22 ENCOUNTER — Other Ambulatory Visit: Payer: Self-pay

## 2019-01-22 ENCOUNTER — Inpatient Hospital Stay: Payer: Medicaid Other | Attending: Oncology

## 2019-01-22 DIAGNOSIS — R739 Hyperglycemia, unspecified: Secondary | ICD-10-CM | POA: Insufficient documentation

## 2019-01-22 DIAGNOSIS — M25551 Pain in right hip: Secondary | ICD-10-CM | POA: Diagnosis not present

## 2019-01-22 DIAGNOSIS — R110 Nausea: Secondary | ICD-10-CM | POA: Diagnosis not present

## 2019-01-22 DIAGNOSIS — Z9079 Acquired absence of other genital organ(s): Secondary | ICD-10-CM | POA: Insufficient documentation

## 2019-01-22 DIAGNOSIS — R600 Localized edema: Secondary | ICD-10-CM | POA: Diagnosis not present

## 2019-01-22 DIAGNOSIS — C7951 Secondary malignant neoplasm of bone: Secondary | ICD-10-CM | POA: Diagnosis not present

## 2019-01-22 DIAGNOSIS — Z79899 Other long term (current) drug therapy: Secondary | ICD-10-CM | POA: Insufficient documentation

## 2019-01-22 DIAGNOSIS — C61 Malignant neoplasm of prostate: Secondary | ICD-10-CM | POA: Diagnosis present

## 2019-01-22 DIAGNOSIS — R591 Generalized enlarged lymph nodes: Secondary | ICD-10-CM | POA: Diagnosis not present

## 2019-01-22 DIAGNOSIS — R5383 Other fatigue: Secondary | ICD-10-CM | POA: Insufficient documentation

## 2019-01-22 LAB — CBC WITH DIFFERENTIAL (CANCER CENTER ONLY)
Abs Immature Granulocytes: 0.05 10*3/uL (ref 0.00–0.07)
Basophils Absolute: 0 10*3/uL (ref 0.0–0.1)
Basophils Relative: 0 %
Eosinophils Absolute: 0.2 10*3/uL (ref 0.0–0.5)
Eosinophils Relative: 2 %
HCT: 35.2 % — ABNORMAL LOW (ref 39.0–52.0)
Hemoglobin: 10.9 g/dL — ABNORMAL LOW (ref 13.0–17.0)
Immature Granulocytes: 1 %
Lymphocytes Relative: 16 %
Lymphs Abs: 1.4 10*3/uL (ref 0.7–4.0)
MCH: 26.4 pg (ref 26.0–34.0)
MCHC: 31 g/dL (ref 30.0–36.0)
MCV: 85.2 fL (ref 80.0–100.0)
Monocytes Absolute: 0.6 10*3/uL (ref 0.1–1.0)
Monocytes Relative: 6 %
Neutro Abs: 6.4 10*3/uL (ref 1.7–7.7)
Neutrophils Relative %: 75 %
Platelet Count: 261 10*3/uL (ref 150–400)
RBC: 4.13 MIL/uL — ABNORMAL LOW (ref 4.22–5.81)
RDW: 16.2 % — ABNORMAL HIGH (ref 11.5–15.5)
WBC Count: 8.6 10*3/uL (ref 4.0–10.5)
nRBC: 0 % (ref 0.0–0.2)

## 2019-01-22 LAB — CMP (CANCER CENTER ONLY)
ALT: 15 U/L (ref 0–44)
AST: 17 U/L (ref 15–41)
Albumin: 3.5 g/dL (ref 3.5–5.0)
Alkaline Phosphatase: 466 U/L — ABNORMAL HIGH (ref 38–126)
Anion gap: 11 (ref 5–15)
BUN: 44 mg/dL — ABNORMAL HIGH (ref 6–20)
CO2: 28 mmol/L (ref 22–32)
Calcium: 9.3 mg/dL (ref 8.9–10.3)
Chloride: 105 mmol/L (ref 98–111)
Creatinine: 2.1 mg/dL — ABNORMAL HIGH (ref 0.61–1.24)
GFR, Est AFR Am: 40 mL/min — ABNORMAL LOW (ref >60)
GFR, Estimated: 34 mL/min — ABNORMAL LOW (ref >60)
Glucose, Bld: 233 mg/dL — ABNORMAL HIGH (ref 70–99)
Potassium: 4.7 mmol/L (ref 3.5–5.1)
Sodium: 144 mmol/L (ref 135–145)
Total Bilirubin: 0.3 mg/dL (ref 0.3–1.2)
Total Protein: 8 g/dL (ref 6.5–8.1)

## 2019-01-23 LAB — PROSTATE-SPECIFIC AG, SERUM (LABCORP): Prostate Specific Ag, Serum: 19.8 ng/mL — ABNORMAL HIGH (ref 0.0–4.0)

## 2019-01-25 ENCOUNTER — Other Ambulatory Visit: Payer: Self-pay

## 2019-01-25 ENCOUNTER — Inpatient Hospital Stay (HOSPITAL_BASED_OUTPATIENT_CLINIC_OR_DEPARTMENT_OTHER): Payer: Medicaid Other | Admitting: Oncology

## 2019-01-25 VITALS — BP 123/71 | HR 113 | Temp 98.3°F | Resp 18 | Wt 259.6 lb

## 2019-01-25 DIAGNOSIS — C61 Malignant neoplasm of prostate: Secondary | ICD-10-CM

## 2019-01-25 NOTE — Progress Notes (Signed)
Histology and Location of Primary Cancer: advanced prostate cancer with lymphadenopathy and bone involvement diagnosed in June 2020.  He was found to have castration-sensitive with Gleason score 9 with a PSA of 410 at the time of diagnosis.  Sites of Visceral and Bony Metastatic Disease: widespread/diffuse multifocal osseous metastatses  Location(s) of Symptomatic Metastases: right hip  Past/Anticipated chemotherapy by medical oncology, if any:  Prior Therapy: He is status post biopsy obtained on 09/17/2018 for his prostate performed by Dr. Gloriann Loan.  This showed a Gleason score of 9 and 12 cores.  He is status post orchiectomy completed on October 12, 2018.   Current therapy: Zytiga 1000 mg daily started in August 2020.   Pain on a scale of 0-10 is: Reports constant right hip pain worse at night. Reports intermittent right knee pain. Right sided hip pain is affecting his ambulation at times. Reports pain in hip is throbbing. Reports knee pain is grinding.   If Spine Met(s), symptoms, if any, include:  Bowel/Bladder retention or incontinence (please describe): Vicodin causes constipation and metformin causes diarrhea thus its "leveled out for now." Reports intermittent urinary hesitancy with a "fan shaped" urine stream.  Numbness or weakness in extremities (please describe): Denies numbness. Reports weakness associated with exertion. Bilateral lower extremity edema improved with Lasix.  Current Decadron regimen, if applicable: Prednisone 5 mg daily.  Ambulatory status? Walker? Wheelchair?: Ambulatory  SAFETY ISSUES:  Prior radiation? no  Pacemaker/ICD? no  Possible current pregnancy? no, male patient  Is the patient on methotrexate? no  Current Complaints / other details:  58 year old male.

## 2019-01-25 NOTE — Progress Notes (Signed)
Hematology and Oncology Follow Up Visit  Jeffery [name] [id number] 13-Dec-1960 58 y.o. 01/25/2019 9:45 AM Jeffery [name], MDElkins, Jeffery [name], *   Principle Diagnosis: 57 year old man with advanced prostate cancer with lymphadenopathy and bone involvement diagnosed in June 2020.  He was found to have castration-sensitive with Gleason score 9 with a PSA of 410 at the time of diagnosis.   Prior Therapy: He is status post biopsy obtained on 09/17/2018 for his prostate performed by Dr. Gloriann [name].  This showed a Gleason score of 9 and 12 cores.  He is status post orchiectomy completed on October 12, 2018.   Current therapy: Zytiga 1000 mg daily started in August 2020.  Interim History: Jeffery [name] is here for a follow-up visit.  Since the last visit, he reports no major changes in his health.  He continues to tolerate Zytiga without any major complications.  He does report some mild nausea and some fatigue as well as lower extremity edema that has improved on Lasix.  Continues to have right-sided hip pain and is affecting his ambulation at times.  He does take Neurontin which is not helping as much at this time.  He denies any recent falls or syncope.  He denies any dyspnea on exertion.  His performance status and quality of life remains unchanged.  Still able to drive without any difficulties.   He denied headaches, blurry vision, syncope or seizures.  Denies any fevers, chills or sweats.  Denied chest pain, palpitation, orthopnea or leg edema.  Denied cough, wheezing or hemoptysis.  Denied nausea, vomiting or abdominal pain.  Denies any constipation or diarrhea.  Denies any frequency urgency or hesitancy.  Denies any arthralgias or myalgias.  Denies any skin rashes or lesions.  Denies any bleeding or clotting tendency.  Denies any easy bruising.  Denies any hair or nail changes.  Denies any anxiety or depression.  Remaining review of system is negative.       Medications: Without any  changes on review. Current Outpatient Medications  Medication Sig Dispense Refill  . Amino Acids-Protein Hydrolys (FEEDING SUPPLEMENT, PRO-STAT SUGAR FREE 64,) LIQD Take 30 mLs by mouth 3 (three) times daily. 887 mL 0  . Blood Glucose Monitoring Suppl (RELION CONFIRM GLUCOSE MONITOR) w/Device KIT Use 4 times daily before meals and bedtime as directed. 1 kit 0  . feeding supplement, ENSURE ENLIVE, (ENSURE ENLIVE) LIQD Take 237 mLs by mouth daily. 237 mL 12  . finasteride (PROSCAR) 5 MG tablet Take 5 mg by mouth daily.    . furosemide (LASIX) 40 MG tablet Take 40 mg by mouth daily.    [name] gabapentin (NEURONTIN) 300 MG capsule Take 1 capsule (300 mg total) by mouth 3 (three) times daily. 60 capsule 1  . glucose blood (RELION GLUCOSE TEST STRIPS) test strip Use as instructed 100 each 12  . HYDROcodone-acetaminophen (NORCO/VICODIN) 5-325 MG tablet TAKE 1 TO 2 TABLETS BY MOUTH EVERY 4 TO 6 HOURS AS NEEDED FOR PAIN. DO NOT EXCEED 8 TABLETS IN 24 HOURS. 40 tablet 0  . Lancets 30G MISC Use 4 times daily before meals as directed. 100 each 0  . Multiple Vitamin (MULTIVITAMIN WITH MINERALS) TABS tablet Take 1 tablet by mouth daily.    . predniSONE (DELTASONE) 5 MG tablet Take 1 tablet (5 mg total) by mouth daily with breakfast. 90 tablet 3  . tamsulosin (FLOMAX) 0.4 MG CAPS capsule Take 2 capsules (0.8 mg total) by mouth daily after supper.    . vitamin B-12 (CYANOCOBALAMIN)  1000 MCG tablet Take 1 tablet (1,000 mcg total) by mouth daily. 30 tablet 0  . ZYTIGA 250 MG tablet TAKE 4 TABLETS BY MOUTH ONCE DAILY AS DIRECTED.  TAKE 1 HOUR BEFORE OR 2 HOURS AFTER A MEAL 120 tablet 10   No current facility-administered medications for this visit.      Allergies: No Known Allergies  Past Medical History, Surgical history, Social history, and Family History without any changes on review.   Physical Exam: Blood pressure 123/71, pulse (!) 113, temperature 98.3 F (36.8 C), temperature source Temporal, resp. rate  18, weight 259 lb 9.6 oz (117.8 kg), SpO2 97 %.    ECOG: 1     General appearance: Alert, awake without any distress. Head: Atraumatic without abnormalities Oropharynx: Without any thrush or ulcers. Eyes: No scleral icterus. Lymph nodes: No lymphadenopathy noted in the cervical, supraclavicular, or axillary nodes Heart:regular rate and rhythm, without any murmurs or gallops.   Lung: Clear to auscultation without any rhonchi, wheezes or dullness to percussion. Abdomin: Soft, nontender without any shifting dullness or ascites. Musculoskeletal: No clubbing or cyanosis. Neurological: No motor or sensory deficits. Skin: No rashes or lesions. Psychiatric: Mood and affect appeared normal.      Lab Results: Lab Results  Component Value Date   WBC 8.6 01/22/2019   HGB 10.9 (L) 01/22/2019   HCT 35.2 (L) 01/22/2019   MCV 85.2 01/22/2019   PLT 261 01/22/2019     Chemistry      Component Value Date/Time   NA 144 01/22/2019 1120   K 4.7 01/22/2019 1120   CL 105 01/22/2019 1120   CO2 28 01/22/2019 1120   BUN 44 (H) 01/22/2019 1120   CREATININE 2.10 (H) 01/22/2019 1120      Component Value Date/Time   CALCIUM 9.3 01/22/2019 1120   ALKPHOS 466 (H) 01/22/2019 1120   AST 17 01/22/2019 1120   ALT 15 01/22/2019 1120   BILITOT 0.3 01/22/2019 1120     Results for Jeffery, [name] (MRN [id number]) as of 01/25/2019 09:48  Ref. Range 12/23/2018 11:31 01/22/2019 11:20  Prostate Specific Ag, Serum Latest Ref Range: 0.0 - 4.0 ng/mL 33.3 (H) 19.8 (H)    Impression and Plan:   58 year old man with:  1.  Castration-sensitive advanced prostate cancer with disease to the bone and lymphadenopathy noted in June of 2020.   The natural course of this disease was reiterated again.  Imaging studies including CT scan and bone scan were personally reviewed and discussed with the patient.  He remains on Zytiga with reasonable response to therapy and currently PSA is declining.  Risks and  benefits of continuing this treatment was discussed today.  I have recommended continuing the same dose and schedule and decrease his prednisone dose by 50%.  This would help with his lower extremity edema as well as hyperglycemia.   2.  Androgen deprivation therapy: No additional androgen deprivation is needed he is status post bilateral orchiectomy.  3.  Bone directed therapy: Delton See is deferred for the time being until he obtains dental clearance.  I recommended calcium and vitamin D supplements.  4.  Prognosis and goals of care: Therapy remains palliative at this time although his performance status is adequate and aggressive measures are warranted.  His disease is incurable.  5.  Right-sided hip pain: Appears to be related to metastatic disease into the bone.  We will refer for radiation oncology evaluation for possible palliative radiation to that hip.  6.  Fluid retention:  Improved at this time with Lasix.  I recommended decreasing the prednisone dose which would help at this time.  7. Follow-up: In 4 weeks for repeat evaluation.  25  minutes was spent with the patient face-to-face today.  More than 50% of time was spent on updating his disease status, treatment options and answering questions regarding future plan of care.    Zola Button, MD 10/12/20209:45 AM therapy:

## 2019-01-25 NOTE — Addendum Note (Signed)
Addended by: Wyatt Portela on: 01/25/2019 09:53 AM   Modules accepted: Orders

## 2019-01-26 ENCOUNTER — Ambulatory Visit
Admission: RE | Admit: 2019-01-26 | Discharge: 2019-01-26 | Disposition: A | Discharge status: Home / Self Care | Payer: Medicaid Other | Source: Ambulatory Visit | Attending: Radiation Oncology | Admitting: Radiation Oncology

## 2019-01-26 ENCOUNTER — Other Ambulatory Visit: Payer: Self-pay

## 2019-01-26 ENCOUNTER — Encounter: Payer: Self-pay | Admitting: Radiation Oncology

## 2019-01-26 VITALS — BP 119/70 | HR 108 | Temp 99.1°F | Resp 20 | Ht 69.0 in | Wt 259.4 lb

## 2019-01-26 DIAGNOSIS — C61 Malignant neoplasm of prostate: Secondary | ICD-10-CM

## 2019-01-26 DIAGNOSIS — C7951 Secondary malignant neoplasm of bone: Secondary | ICD-10-CM | POA: Diagnosis present

## 2019-01-26 DIAGNOSIS — K8020 Calculus of gallbladder without cholecystitis without obstruction: Secondary | ICD-10-CM | POA: Diagnosis not present

## 2019-01-26 DIAGNOSIS — E1122 Type 2 diabetes mellitus with diabetic chronic kidney disease: Secondary | ICD-10-CM | POA: Diagnosis not present

## 2019-01-26 DIAGNOSIS — Z191 Hormone sensitive malignancy status: Secondary | ICD-10-CM | POA: Diagnosis not present

## 2019-01-26 DIAGNOSIS — C7952 Secondary malignant neoplasm of bone marrow: Secondary | ICD-10-CM

## 2019-01-26 DIAGNOSIS — Z87891 Personal history of nicotine dependence: Secondary | ICD-10-CM | POA: Diagnosis not present

## 2019-01-26 DIAGNOSIS — E785 Hyperlipidemia, unspecified: Secondary | ICD-10-CM | POA: Insufficient documentation

## 2019-01-26 DIAGNOSIS — N1330 Unspecified hydronephrosis: Secondary | ICD-10-CM | POA: Diagnosis not present

## 2019-01-26 NOTE — Progress Notes (Signed)
See progress note under physician encounter. 

## 2019-01-26 NOTE — Progress Notes (Signed)
Radiation Oncology         [phone] ________________________________  Initial Outpatient Consultation  Name: Jeffery [name] MRN: [id number]  Date: 01/26/2019  DOB: [birth year]  GU:[id number], [name], MD  Wyatt Portela, MD   REFERRING PHYSICIAN: Wyatt Portela, MD  DIAGNOSIS: 58 y.o. 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    ICD-10-CM   1. Prostate cancer metastatic to multiple sites [facility])  C61     HISTORY OF PRESENT ILLNESS: Jeffery [name] is a 58 y.o. male with a diagnosis of prostate cancer. He presented to Dr. Gloriann Loan on 08/04/2018 with a history of urinary retention in 2018 and bilateral hydronephrosis, previously managed through the residency urology clinic. At his recent visit, he reported continued weak stream, incomplete emptying, and some abdominal fullness. Digital rectal examination was performed at that time showing no nodules. He underwent cystoscopy at that time, which showed an obstructing prostate with moderate hyperplasia, possibly 3-4 cm, with no significant median lobe. PSA performed on 08/20/2018, in anticipation of proceeding with TURP, was significantly abnormal at 410.  Given the abnormal PSA, the patient proceeded to transrectal ultrasound with 12 biopsies of the prostate on 09/17/2018.  The prostate volume measured 75.18 cc.  Out of 12 core biopsies, all 12 were positive and showed perineural invasion  The maximum Gleason score was 4+5, and this was seen in the left base lateral (EPE), left mid lateral (EPE), right base, right apex (EPE), right mid lateral (EPE), and right apex lateral.  Additionally, Gleason 4+4 was seen in the right base lateral (EPE), right mid, left apex (EPE), left mid (EPE), left base, and left apex lateral (EPE).  He was scheduled to begin ADT but presented to the ED on 10/11/2018 with hyperkalemia and bilateral hydronephrosis, secondary to urinary retention. He underwent CT  A/P on admission which showed findings suspicious for metastatic bladder cancer, with bilateral hydronephrosis, pathologically enlarged pelvic, inguinal, and retroperitoneal lymph nodes, diffuse osseous sclerotic lesions involving virtually all of the visualized osseous structures, highly concerning for osseous metastatic disease and a partially visualized ground glass airspace opacity in the right lower lobe. The patient opted to proceed with bilateral orchiectomy for LT-ADT, and cystoscopy with urethral dilation and Foley catheter placement for relief of the BOO.    He was seen by Dr. Alen Blew while in the hospital on 10/14/2018 and followed up as outpatient on 10/30/2018 to further discuss treatment options. The patient was started on Zytiga on 11/16/2018.  After starting Zytiga, he did develop fluid retention, predominantly in the lower extremities as well as slight abdominal distention and pleural effusion.  He did require thoracentesis with a little over 1 L of fluid removed on 12/15/2018 with improvement in his respiratory status.  Fluid cytology did not show any malignancy.  He has since been started on Lasix, prescribed and managed by his nephrologist, Dr. Justin Mend and has noted some improvement in the lower extremity edema and has not required any further procedures for excess fluid management.  A repeat PSA on 12/23/2018 was significantly improved down to 33.3.  He underwent restaging scans on 12/23/2018 with CT A/P showing an interval mixed response to therapy with marked progression of widespread sclerotic bone metastases, but improvement in retroperitoneal and pelvic adenopathy and unchanged tumor within the bladder base which obstructed the left UVJ and invades the distal right ureter causing persistent high-grade bilateral hydronephrosis. A bone scan that same day also confirmed widespread/diffuse osseous metastases.  Fortunately, his most recent PSA on 01/22/2019 shows continued decline, now at 19.8. However, he  has continued with right-sided hip pain that is worse with activities and is now affecting his ambulation and no longer well controlled with Neurontin and Percocet which were more helpful previously.  He is also complaining of significant pain in the right scapula and shoulder region.  He denies any recent falls or injuries.  The patient reviewed the recent scan results with Dr. Alen Blew, and he has kindly been referred today for discussion of possible palliative radiation therapy to the painful right hip metastasis.   PREVIOUS RADIATION THERAPY: No  PAST MEDICAL HISTORY:  Past Medical History:  Diagnosis Date   Diabetes mellitus, new onset ([place]) 07/2016   Enlarged prostate    HLD (hyperlipidemia)       PAST SURGICAL HISTORY: Past Surgical History:  Procedure Laterality Date   KNEE ARTHROSCOPY Right    ORCHIECTOMY Bilateral 10/12/2018   Procedure: ORCHIECTOMY;  Surgeon: Lucas Mallow, MD;  Location: [facility];  Service: Urology;  Laterality: Bilateral;   TRANSURETHRAL RESECTION OF PROSTATE  10/12/2018   Procedure: CYSTOSCOPY WITH URETHRAL DILATION;  Surgeon: Lucas Mallow, MD;  Location: [facility];  Service: Urology;;    FAMILY HISTORY:  Family History  Problem Relation Age of Onset   Prostate cancer Father    Heart disease Father     SOCIAL HISTORY:  Social History   Socioeconomic History   Marital status: Divorced    Spouse name: Not on file   Number of children: 1   Years of education: Not on file   Highest education level: Not on file  Occupational History   Occupation: Teaching laboratory technician  Social Needs   Financial resource strain: Not on file   Food insecurity    Worry: Not on file    Inability: Not on file   Transportation needs    Medical: Not on file    Non-medical: Not on file  Tobacco Use   Smoking status: Former Smoker    Types: Cigarettes    Quit date: 2015    Years since quitting: 5.7   Smokeless tobacco: Never Used    Tobacco comment: 2015  Substance and Sexual Activity   Alcohol use: No   Drug use: No   Sexual activity: Not on file  Lifestyle   Physical activity    Days per week: Not on file    Minutes per session: Not on file   Stress: Not on file  Relationships   Social connections    Talks on phone: Not on file    Gets together: Not on file    Attends religious service: Not on file    Active member of club or organization: Not on file    Attends meetings of clubs or organizations: Not on file    Relationship status: Not on file   Intimate partner violence    Fear of current or ex partner: Not on file    Emotionally abused: Not on file    Physically abused: Not on file    Forced sexual activity: Not on file  Other Topics Concern   Not on file  Social History Narrative   The patient is separated he has 1 son, he is a Academic librarian.   Quit smoking 2015, no alcohol no caffeine no tobacco or drug use   No health insurance    ALLERGIES: Patient has no known allergies.  MEDICATIONS:  Current  Outpatient Medications  Medication Sig Dispense Refill   Amino Acids-Protein Hydrolys (FEEDING SUPPLEMENT, PRO-STAT SUGAR FREE 64,) LIQD Take 30 mLs by mouth 3 (three) times daily. 887 mL 0   Blood Glucose Monitoring Suppl (RELION CONFIRM GLUCOSE MONITOR) w/Device KIT Use 4 times daily before meals and bedtime as directed. 1 kit 0   feeding supplement, ENSURE ENLIVE, (ENSURE ENLIVE) LIQD Take 237 mLs by mouth daily. 237 mL 12   finasteride (PROSCAR) 5 MG tablet Take 5 mg by mouth daily.     furosemide (LASIX) 40 MG tablet Take 40 mg by mouth daily.     gabapentin (NEURONTIN) 300 MG capsule Take 1 capsule (300 mg total) by mouth 3 (three) times daily. 60 capsule 1   glucose blood (RELION GLUCOSE TEST STRIPS) test strip Use as instructed 100 each 12   HYDROcodone-acetaminophen (NORCO/VICODIN) 5-325 MG tablet TAKE 1 TO 2 TABLETS BY MOUTH EVERY 4 TO 6 HOURS AS  NEEDED FOR PAIN. DO NOT EXCEED 8 TABLETS IN 24 HOURS. 40 tablet 0   Lancets 30G MISC Use 4 times daily before meals as directed. 100 each 0   Multiple Vitamin (MULTIVITAMIN WITH MINERALS) TABS tablet Take 1 tablet by mouth daily.     predniSONE (DELTASONE) 5 MG tablet Take 1 tablet (5 mg total) by mouth daily with breakfast. 90 tablet 3   tamsulosin (FLOMAX) 0.4 MG CAPS capsule Take 2 capsules (0.8 mg total) by mouth daily after supper.     vitamin B-12 (CYANOCOBALAMIN) 1000 MCG tablet Take 1 tablet (1,000 mcg total) by mouth daily. 30 tablet 0   ZYTIGA 250 MG tablet TAKE 4 TABLETS BY MOUTH ONCE DAILY AS DIRECTED.  TAKE 1 HOUR BEFORE OR 2 HOURS AFTER A MEAL 120 tablet 10   No current facility-administered medications for this encounter.     REVIEW OF SYSTEMS:  On review of systems, the patient reports that he is doing well overall. He denies any chest pain, shortness of breath, cough, fevers, chills, night sweats, or unintended weight changes. He denies any bowel disturbances, and denies abdominal pain, nausea or vomiting. He reports constant, throbbing right hip pain that is worse at night and intermittent, ginding right knee pain. A complete review of systems is obtained and is otherwise negative.  PHYSICAL EXAM:  Wt Readings from Last 3 Encounters:  01/26/19 259 lb 4 oz (117.6 kg)  01/25/19 259 lb 9.6 oz (117.8 kg)  12/29/18 261 lb 14.4 oz (118.8 kg)   Temp Readings from Last 3 Encounters:  01/26/19 99.1 F (37.3 C) (Temporal)  01/25/19 98.3 F (36.8 C) (Temporal)  12/29/18 98 F (36.7 C) (Oral)   BP Readings from Last 3 Encounters:  01/26/19 119/70  01/25/19 123/71  12/29/18 132/86   Pulse Readings from Last 3 Encounters:  01/26/19 (!) 108  01/25/19 (!) 113  12/29/18 95    Pain is rated 8/10 in the right hip radiating to the right knee and 8/10 in the right scapula.  In general this is a well appearing caucasian male in no acute distress. He is alert and oriented x4  and appropriate throughout the examination. HEENT reveals that the patient is normocephalic, atraumatic. EOMs are intact. PERRLA. Skin is intact without any evidence of gross lesions. Cardiovascular exam reveals a regular rate and rhythm, no clicks rubs or murmurs are auscultated. Chest is clear to auscultation bilaterally. Lymphatic assessment is performed and does not reveal any adenopathy in the cervical, supraclavicular, axillary, or inguinal chains. Abdomen has active bowel  sounds in all quadrants and is intact. The abdomen is soft, non tender, non distended. Lower extremities are negative for deep calf tenderness, cyanosis or clubbing.  There is 2+ bilateral pretibial pitting edema.  KPS = 70  100 - Normal; no complaints; no evidence of disease. 90   - Able to carry on normal activity; minor signs or symptoms of disease. 80   - Normal activity with effort; some signs or symptoms of disease. 52   - Cares for self; unable to carry on normal activity or to do active work. 60   - Requires occasional assistance, but is able to care for most of his personal needs. 50   - Requires considerable assistance and frequent medical care. 61   - Disabled; requires special care and assistance. 55   - Severely disabled; hospital admission is indicated although death not imminent. 69   - Very sick; hospital admission necessary; active supportive treatment necessary. 10   - Moribund; fatal processes progressing rapidly. 0     - Dead  Karnofsky DA, Abelmann Stoddard, Craver LS and Burchenal [facility] [phone]) The use of the nitrogen mustards in the palliative treatment of carcinoma: with particular reference to bronchogenic carcinoma Cancer 1 [phone]  LABORATORY DATA:  Lab Results  Component Value Date   WBC 8.6 01/22/2019   HGB 10.9 (L) 01/22/2019   HCT 35.2 (L) 01/22/2019   MCV 85.2 01/22/2019   PLT 261 01/22/2019   Lab Results  Component Value Date   NA 144 01/22/2019   K 4.7 01/22/2019   CL 105 01/22/2019    CO2 28 01/22/2019   Lab Results  Component Value Date   ALT 15 01/22/2019   AST 17 01/22/2019   ALKPHOS 466 (H) 01/22/2019   BILITOT 0.3 01/22/2019     RADIOGRAPHY: US Abdomen Complete  Addendum Date: 01/18/2019   ADDENDUM REPORT: 01/18/2019 15:45 ADDENDUM: As noted in the body of the report, the common bile duct diameter is 9 mm. Correlation with liver function test recommended. Electronically Signed   By: Misty Stanley M.D.   On: 01/18/2019 15:45   Result Date: 01/18/2019 CLINICAL DATA:  Chronic kidney disease. EXAM: ABDOMEN ULTRASOUND COMPLETE COMPARISON:  CT scan 12/23/2018 FINDINGS: Gallbladder: Gallstones identified measuring up to 1 cm. Gallbladder wall thickness at 2-3 mm is within normal limits. Sonographer reports no sonographic Murphy sign. Common bile duct: Diameter: 9 mm Liver: No focal lesion identified. Within normal limits in parenchymal echogenicity. Portal vein is patent on color Doppler imaging with normal direction of blood flow towards the liver. IVC: No abnormality visualized. Pancreas: Visualized portion unremarkable. Spleen: Size and appearance within normal limits. Right Kidney: Length: 11.2 cm.  Hydronephrosis, similar to prior CT. Left Kidney: Length: 14 cm.  Hydronephrosis, similar to prior CT. Abdominal aorta: No aneurysm visualized. Other findings: None. IMPRESSION: Bilateral hydronephrosis, similar to CT of 12/23/2018. Cholelithiasis. Electronically Signed: By: Misty Stanley M.D. On: 01/18/2019 15:35      IMPRESSION/PLAN: 1. 58 y.o. 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    Today, we talked to the patient about the findings and workup thus far. We discussed the natural history of metastatic prostate cancer and general treatment, highlighting the role of palliative radiotherapy in the management of painful bony disease. We discussed the available  radiation techniques, and focused on the details of logistics and delivery. The recommendation is to proceed with a 2 week  course of daily radiotherapy to the right hip/iliac bone and right scapula. We reviewed the anticipated acute and late sequelae associated with radiation in this setting. The patient was encouraged to ask questions that were answered to his satisfaction.  At the conclusion of our conversation the patient is interested in moving forward with a 2 week course of daily radiotherapy to the right hip/iliac bone and right scapula.  He has freely signed written consent to proceed today in the office and a copy of this document will be placed in his medical record.  He is scheduled for CT simulation on 01/29/19 at 9am in anticipation of beginning his treatments on Monday, 02/01/2019.  He appears to have a good understanding of his disease and our recommendations which are of palliative intent.  He is in agreement with and comfortable with the stated plan and knows to call with any questions or concerns in the interim.  We will share our discussion with Dr. Alen Blew and move forward with treatment planning accordingly.    Nicholos Johns, PA-C    Tyler Pita, MD  [facility] Direct Dial: [phone]   Fax: [phone] [email or web address]   Skype   [email or web address]   This document serves as a record of services personally performed by Tyler Pita, MD and Freeman Caldron, PA-C. It was created on their behalf by Wilburn Mylar, a trained medical scribe. The creation of this record is based on the scribe's personal observations and the provider's statements to them. This document has been checked and approved by the attending provider.

## 2019-01-27 ENCOUNTER — Ambulatory Visit (HOSPITAL_COMMUNITY): Payer: Medicaid Other | Admitting: Anesthesiology

## 2019-01-27 ENCOUNTER — Emergency Department (HOSPITAL_COMMUNITY)
Admission: EM | Admit: 2019-01-27 | Discharge: 2019-01-27 | Disposition: A | Discharge status: Home / Self Care | Payer: Medicaid Other | Source: Home / Self Care | Attending: Emergency Medicine | Admitting: Emergency Medicine

## 2019-01-27 ENCOUNTER — Encounter (HOSPITAL_COMMUNITY): Payer: Self-pay

## 2019-01-27 ENCOUNTER — Emergency Department (HOSPITAL_COMMUNITY): Payer: Medicaid Other

## 2019-01-27 ENCOUNTER — Ambulatory Visit (HOSPITAL_COMMUNITY): Payer: Medicaid Other

## 2019-01-27 ENCOUNTER — Other Ambulatory Visit: Payer: Self-pay

## 2019-01-27 ENCOUNTER — Encounter (HOSPITAL_COMMUNITY)
Admission: RE | Disposition: A | Discharge status: Home / Self Care | Payer: Self-pay | Source: Ambulatory Visit | Attending: Orthopedic Surgery

## 2019-01-27 ENCOUNTER — Encounter (HOSPITAL_COMMUNITY): Payer: Self-pay | Admitting: *Deleted

## 2019-01-27 ENCOUNTER — Ambulatory Visit (HOSPITAL_COMMUNITY)
Admission: RE | Admit: 2019-01-27 | Discharge: 2019-01-27 | Disposition: A | Discharge status: Home / Self Care | Payer: Medicaid Other | Source: Ambulatory Visit | Attending: Orthopedic Surgery | Admitting: Orthopedic Surgery

## 2019-01-27 DIAGNOSIS — Z7984 Long term (current) use of oral hypoglycemic drugs: Secondary | ICD-10-CM | POA: Insufficient documentation

## 2019-01-27 DIAGNOSIS — I129 Hypertensive chronic kidney disease with stage 1 through stage 4 chronic kidney disease, or unspecified chronic kidney disease: Secondary | ICD-10-CM | POA: Insufficient documentation

## 2019-01-27 DIAGNOSIS — S68521A Partial traumatic transphalangeal amputation of right thumb, initial encounter: Secondary | ICD-10-CM | POA: Insufficient documentation

## 2019-01-27 DIAGNOSIS — E119 Type 2 diabetes mellitus without complications: Secondary | ICD-10-CM | POA: Diagnosis not present

## 2019-01-27 DIAGNOSIS — W312XXA Contact with powered woodworking and forming machines, initial encounter: Secondary | ICD-10-CM | POA: Insufficient documentation

## 2019-01-27 DIAGNOSIS — Z20828 Contact with and (suspected) exposure to other viral communicable diseases: Secondary | ICD-10-CM | POA: Insufficient documentation

## 2019-01-27 DIAGNOSIS — Z79899 Other long term (current) drug therapy: Secondary | ICD-10-CM | POA: Insufficient documentation

## 2019-01-27 DIAGNOSIS — S68512A Complete traumatic transphalangeal amputation of left thumb, initial encounter: Secondary | ICD-10-CM | POA: Insufficient documentation

## 2019-01-27 DIAGNOSIS — Y9389 Activity, other specified: Secondary | ICD-10-CM | POA: Insufficient documentation

## 2019-01-27 DIAGNOSIS — N183 Chronic kidney disease, stage 3 unspecified: Secondary | ICD-10-CM | POA: Insufficient documentation

## 2019-01-27 DIAGNOSIS — Y999 Unspecified external cause status: Secondary | ICD-10-CM | POA: Insufficient documentation

## 2019-01-27 DIAGNOSIS — S6990XA Unspecified injury of unspecified wrist, hand and finger(s), initial encounter: Secondary | ICD-10-CM

## 2019-01-27 DIAGNOSIS — Y92009 Unspecified place in unspecified non-institutional (private) residence as the place of occurrence of the external cause: Secondary | ICD-10-CM | POA: Insufficient documentation

## 2019-01-27 DIAGNOSIS — Z23 Encounter for immunization: Secondary | ICD-10-CM | POA: Insufficient documentation

## 2019-01-27 DIAGNOSIS — Z8546 Personal history of malignant neoplasm of prostate: Secondary | ICD-10-CM | POA: Insufficient documentation

## 2019-01-27 DIAGNOSIS — I10 Essential (primary) hypertension: Secondary | ICD-10-CM | POA: Diagnosis not present

## 2019-01-27 DIAGNOSIS — Z87891 Personal history of nicotine dependence: Secondary | ICD-10-CM | POA: Insufficient documentation

## 2019-01-27 DIAGNOSIS — R918 Other nonspecific abnormal finding of lung field: Secondary | ICD-10-CM

## 2019-01-27 DIAGNOSIS — E1122 Type 2 diabetes mellitus with diabetic chronic kidney disease: Secondary | ICD-10-CM | POA: Insufficient documentation

## 2019-01-27 HISTORY — DX: Malignant (primary) neoplasm, unspecified: C80.1

## 2019-01-27 HISTORY — PX: I&D EXTREMITY: SHX5045

## 2019-01-27 LAB — CBC
HCT: 36.2 % — ABNORMAL LOW (ref 39.0–52.0)
Hemoglobin: 10.9 g/dL — ABNORMAL LOW (ref 13.0–17.0)
MCH: 26.7 pg (ref 26.0–34.0)
MCHC: 30.1 g/dL (ref 30.0–36.0)
MCV: 88.5 fL (ref 80.0–100.0)
Platelets: 243 10*3/uL (ref 150–400)
RBC: 4.09 MIL/uL — ABNORMAL LOW (ref 4.22–5.81)
RDW: 15.9 % — ABNORMAL HIGH (ref 11.5–15.5)
WBC: 8.9 10*3/uL (ref 4.0–10.5)
nRBC: 0 % (ref 0.0–0.2)

## 2019-01-27 LAB — BASIC METABOLIC PANEL
Anion gap: 13 (ref 5–15)
BUN: 34 mg/dL — ABNORMAL HIGH (ref 6–20)
CO2: 24 mmol/L (ref 22–32)
Calcium: 9 mg/dL (ref 8.9–10.3)
Chloride: 102 mmol/L (ref 98–111)
Creatinine, Ser: 2.05 mg/dL — ABNORMAL HIGH (ref 0.61–1.24)
GFR calc Af Amer: 41 mL/min — ABNORMAL LOW (ref >60)
GFR calc non Af Amer: 35 mL/min — ABNORMAL LOW (ref >60)
Glucose, Bld: 198 mg/dL — ABNORMAL HIGH (ref 70–99)
Potassium: 4.1 mmol/L (ref 3.5–5.1)
Sodium: 139 mmol/L (ref 135–145)

## 2019-01-27 LAB — GLUCOSE, CAPILLARY
Glucose-Capillary: 155 mg/dL — ABNORMAL HIGH (ref 70–99)
Glucose-Capillary: 134 mg/dL — ABNORMAL HIGH (ref 70–99)

## 2019-01-27 LAB — SARS CORONAVIRUS 2 BY RT PCR (HOSPITAL ORDER, PERFORMED IN ~~LOC~~ HOSPITAL LAB): SARS Coronavirus 2: NEGATIVE

## 2019-01-27 IMAGING — CR DG FINGER THUMB 2+V*L*
3 series · 3 of 3 positions shown · non-contrast
Comparison: None.

CLINICAL DATA: Amputation

EXAM:
LEFT THUMB 2+V

[x finger obl left]
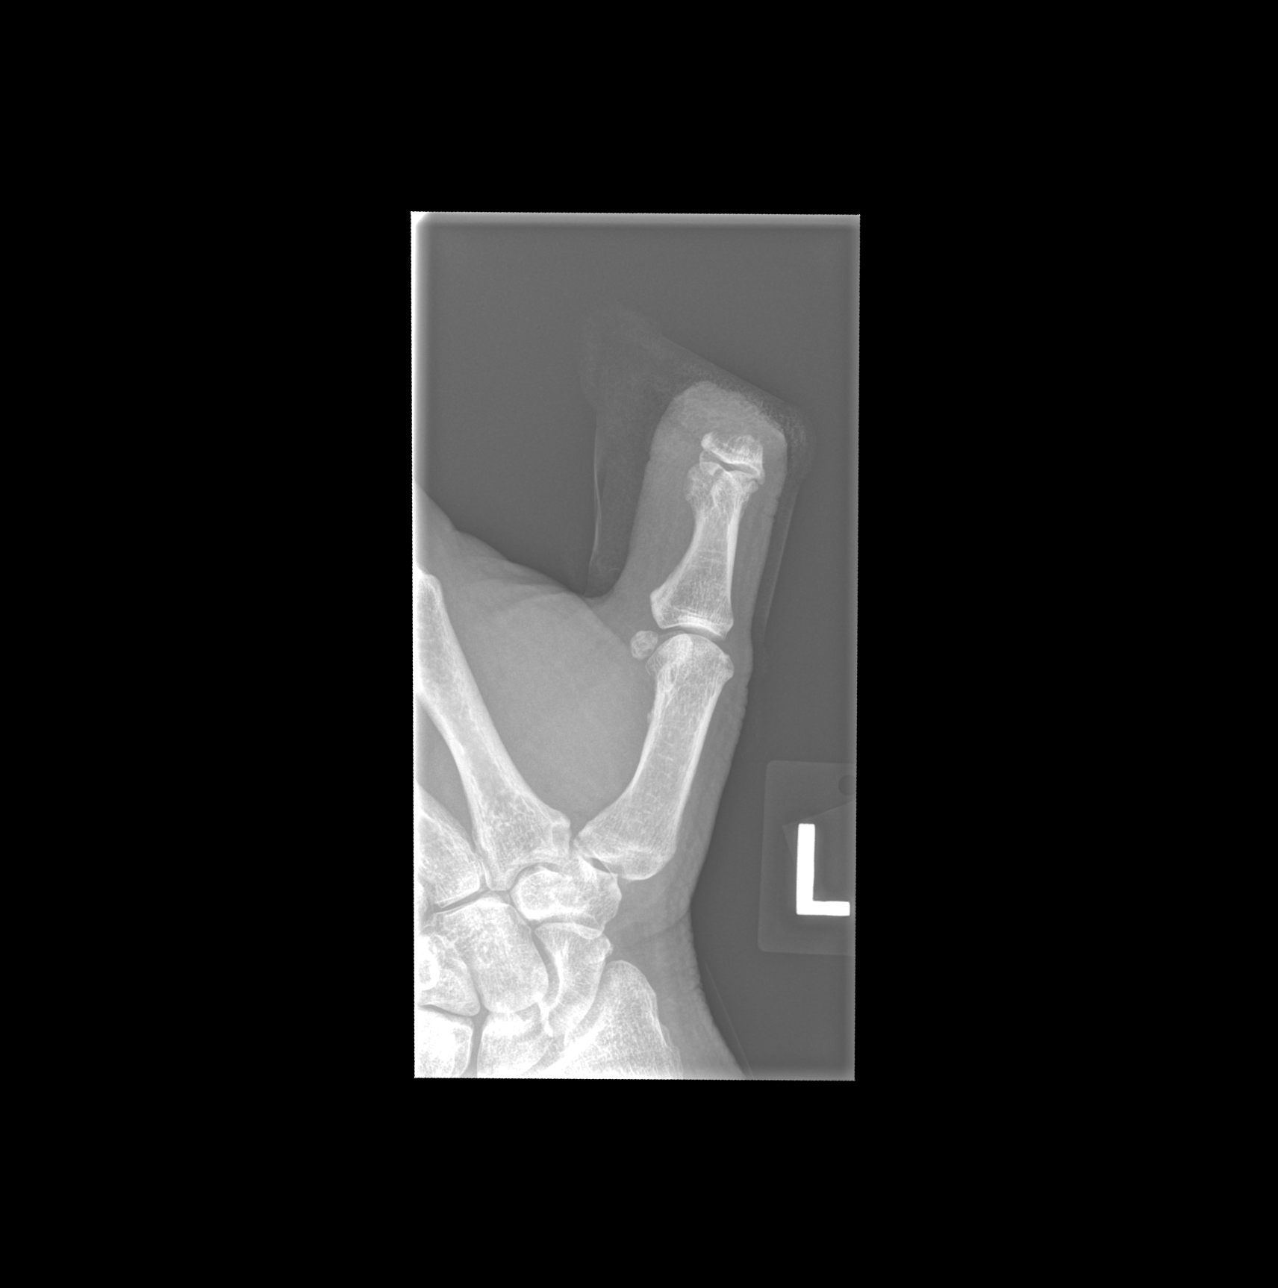

[x finger pa left]
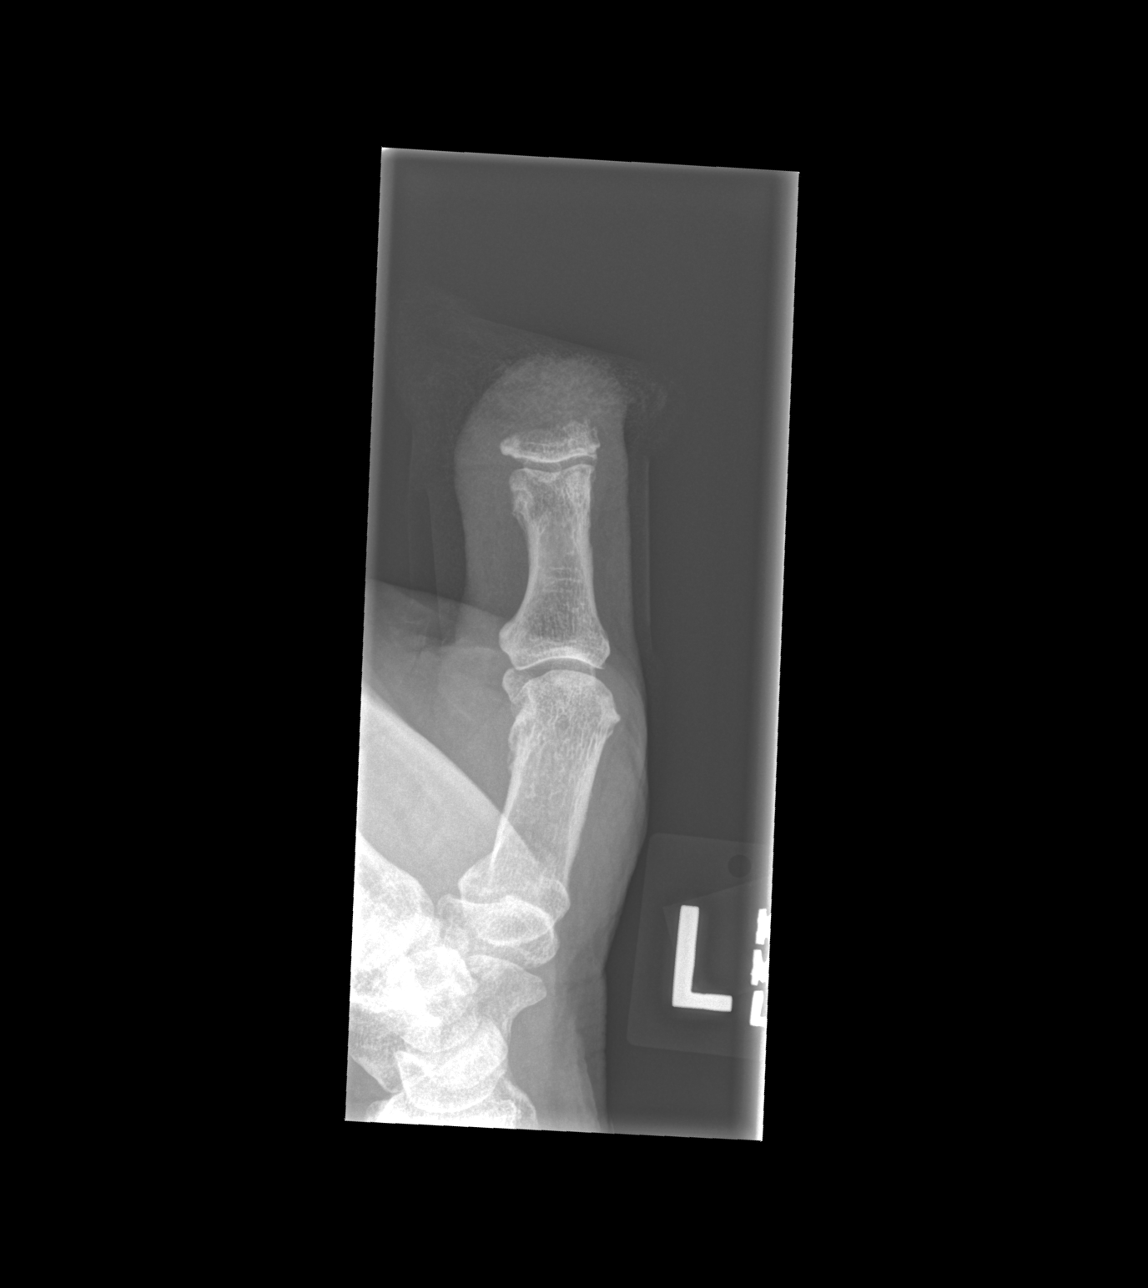

[x finger lat left]
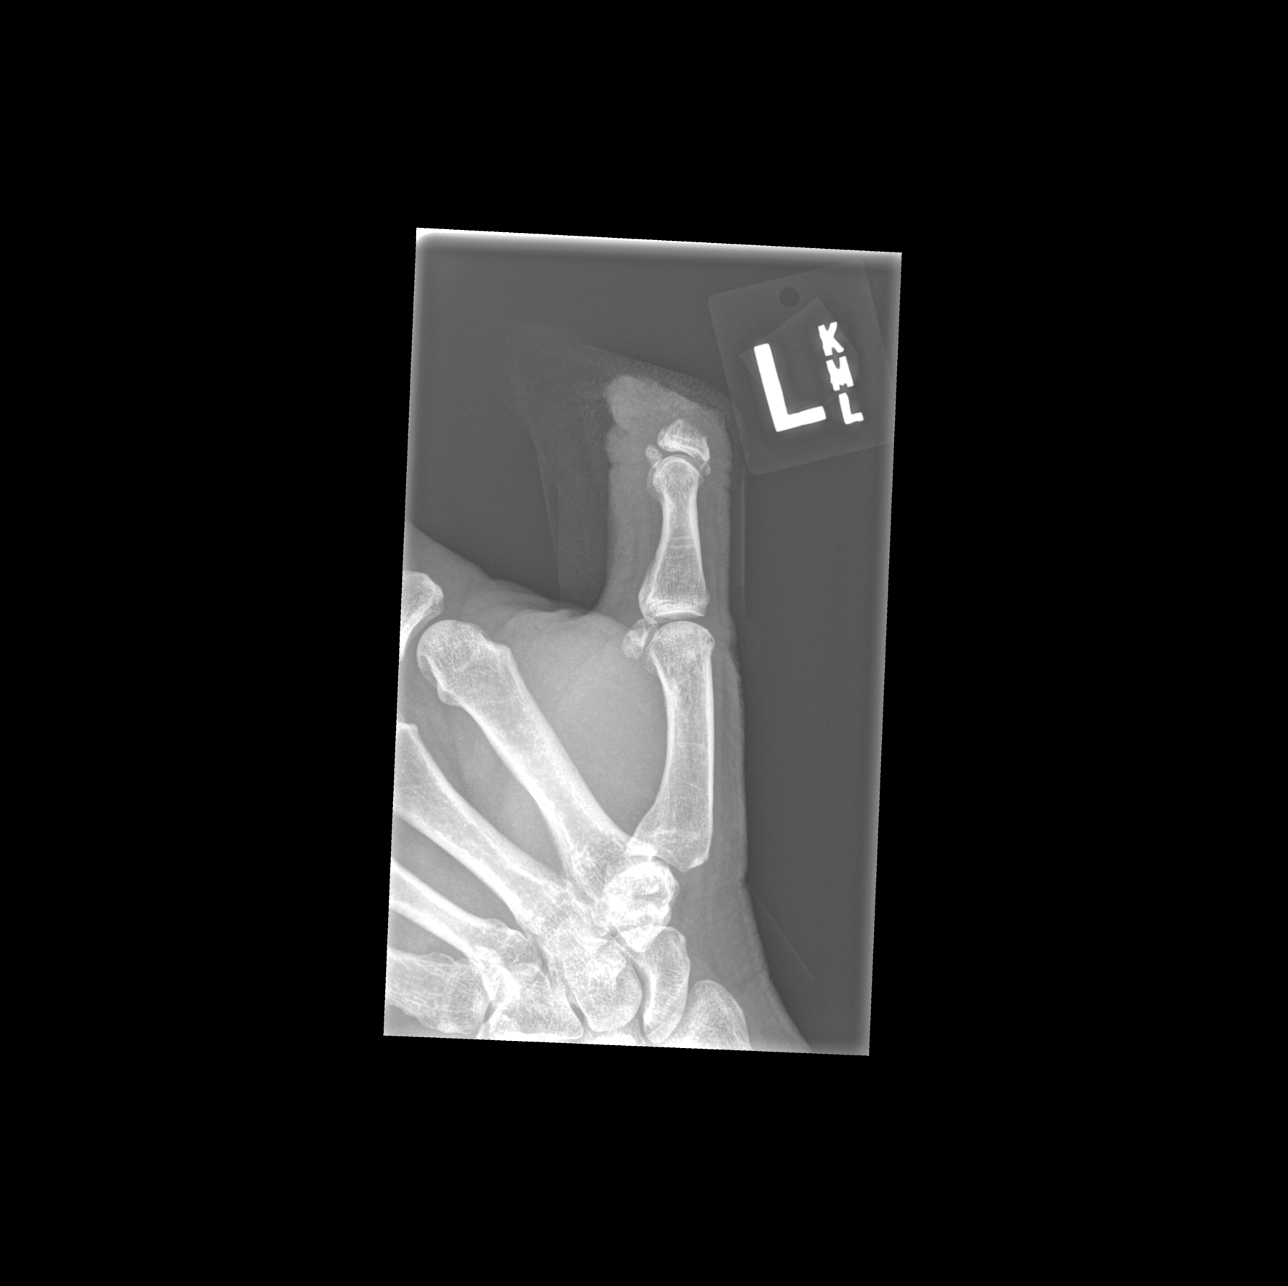

[3 of 3 positions shown; findings below may reference images not displayed]

FINDINGS: There is transverse amputation of the tuft of the left thumb distal
phalanx. The thumb interphalangeal joint and proximal thumb appear
intact. No radiopaque foreign body identified. Bandage material
about the thumb.
IMPRESSION: There is transverse amputation of the tuft of the left thumb distal
phalanx. The thumb interphalangeal joint and proximal thumb appear
intact. No radiopaque foreign body identified.

## 2019-01-27 IMAGING — DX DG CHEST 1V PORT
1 series · 1 of 1 positions shown · non-contrast
Comparison: [DATE] chest radiograph.

CLINICAL DATA: Left thumb I & D

EXAM:
PORTABLE CHEST 1 VIEW

[chest ap]
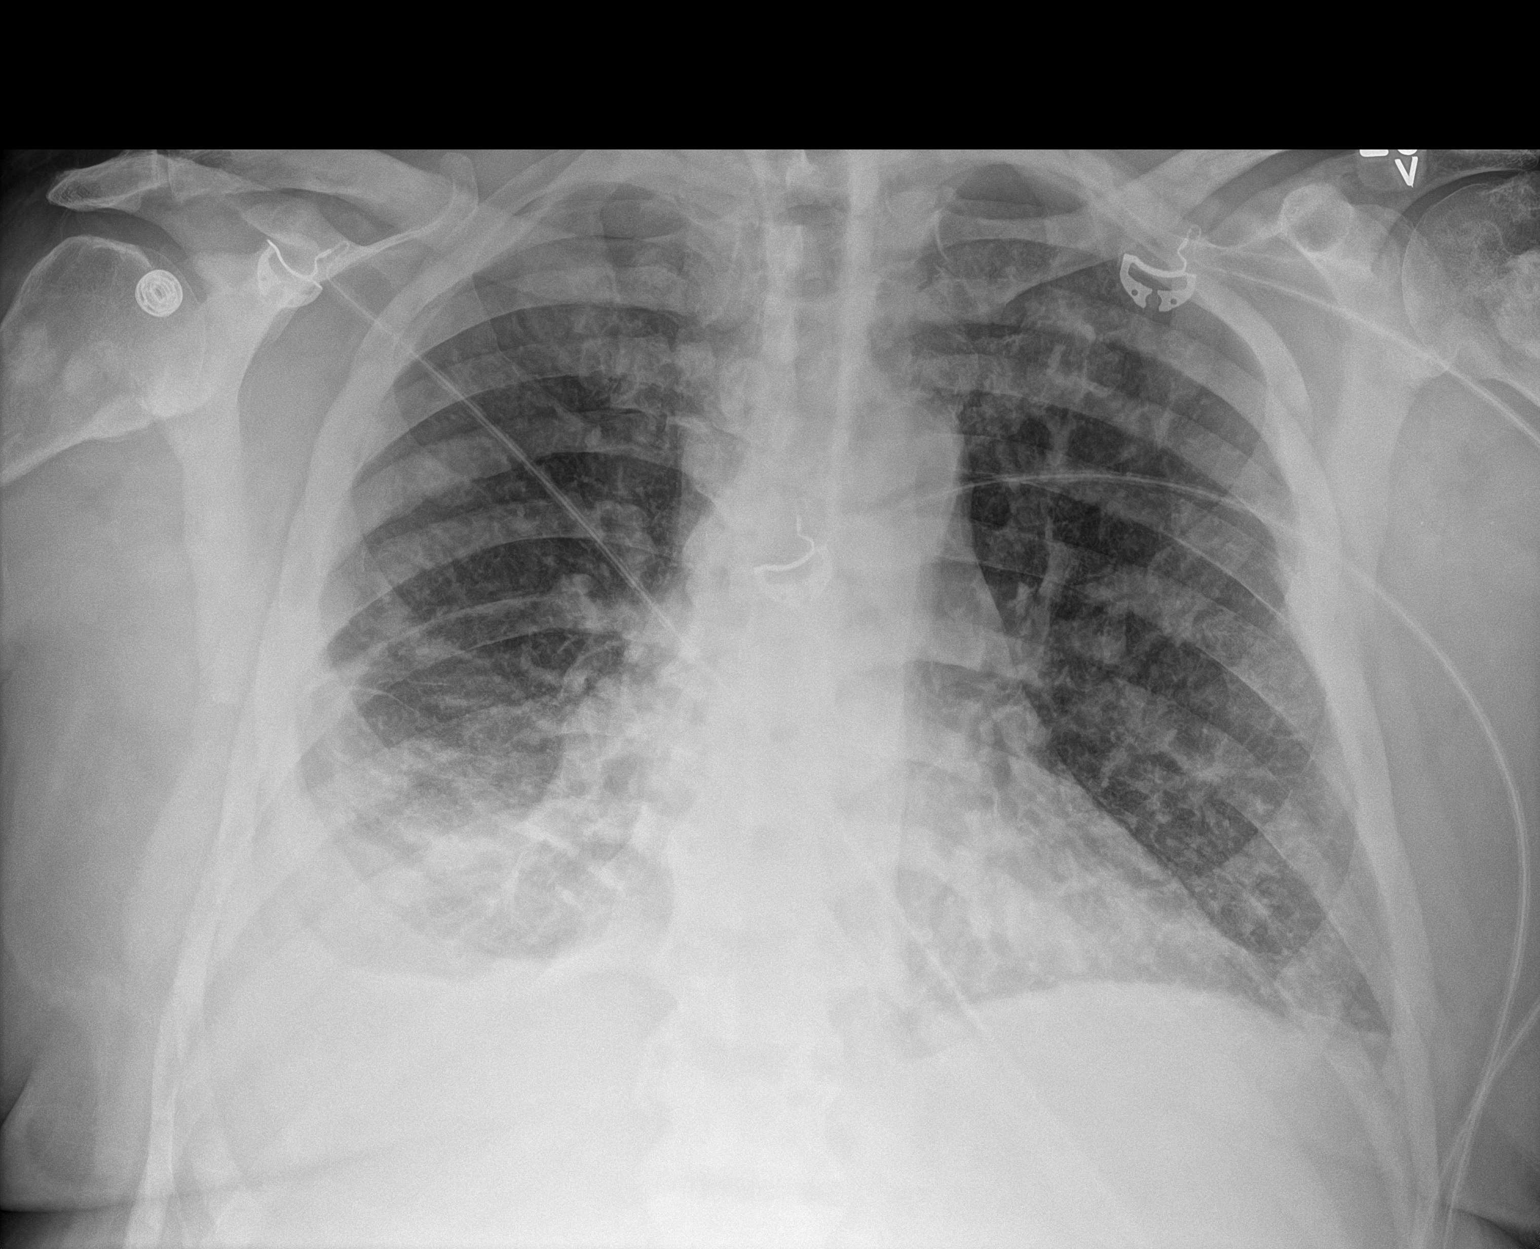

[1 of 1 positions shown; findings below may reference images not displayed]

FINDINGS: Stable cardiomediastinal silhouette with normal heart size. No
pneumothorax. Small right pleural effusion, slightly decreased from
prior. No definite left pleural effusion. Patchy opacities in the
right greater than left lower lungs bilaterally. No overt pulmonary
edema. Abnormal widespread nodular sclerosis throughout the
visualized skeleton.
IMPRESSION: 1. Small right pleural effusion, slightly decreased from prior.
2. Patchy opacities in the right greater than left lower lungs,
favor atelectasis.
3. Abnormal widespread nodular sclerosis throughout the visualized
skeleton, compatible with metastatic disease from known prostate
cancer.

## 2019-01-27 SURGERY — IRRIGATION AND DEBRIDEMENT EXTREMITY
Anesthesia: General | Site: Thumb | Laterality: Left

## 2019-01-27 MED ORDER — PROPOFOL 10 MG/ML IV BOLUS
INTRAVENOUS | Status: DC | PRN
  Administered 2019-01-27: 150 mg via INTRAVENOUS

## 2019-01-27 MED ORDER — ALBUTEROL SULFATE (2.5 MG/3ML) 0.083% IN NEBU
INHALATION_SOLUTION | RESPIRATORY_TRACT | Status: AC
  Administered 2019-01-27: 2.5 mg via RESPIRATORY_TRACT
  Filled 2019-01-27: qty 3

## 2019-01-27 MED ORDER — FUROSEMIDE 10 MG/ML IJ SOLN
INTRAMUSCULAR | Status: AC
  Filled 2019-01-27: qty 4

## 2019-01-27 MED ORDER — CEPHALEXIN 500 MG PO CAPS: 500.0000 mg | ORAL_CAPSULE | Freq: Four times a day (QID) | ORAL | 0 refills | Status: AC

## 2019-01-27 MED ORDER — OXYCODONE-ACETAMINOPHEN 5-325 MG PO TABS: 1.0000 | ORAL_TABLET | ORAL | 0 refills | Status: AC | PRN

## 2019-01-27 MED ORDER — CHLORHEXIDINE GLUCONATE 4 % EX LIQD: 60.0000 mL | Freq: Once | CUTANEOUS | Status: DC

## 2019-01-27 MED ORDER — PROMETHAZINE HCL 25 MG/ML IJ SOLN: 6.2500 mg | INTRAMUSCULAR | Status: DC | PRN

## 2019-01-27 MED ORDER — DEXAMETHASONE SODIUM PHOSPHATE 10 MG/ML IJ SOLN
INTRAMUSCULAR | Status: DC | PRN
  Administered 2019-01-27: 5 mg via INTRAVENOUS

## 2019-01-27 MED ORDER — MIDAZOLAM HCL 2 MG/2ML IJ SOLN
INTRAMUSCULAR | Status: AC
  Filled 2019-01-27: qty 2

## 2019-01-27 MED ORDER — ALBUTEROL SULFATE (2.5 MG/3ML) 0.083% IN NEBU
2.5000 mg | INHALATION_SOLUTION | Freq: Four times a day (QID) | RESPIRATORY_TRACT | Status: DC | PRN
  Administered 2019-01-27: 20:00:00 2.5 mg via RESPIRATORY_TRACT

## 2019-01-27 MED ORDER — BUPIVACAINE HCL (PF) 0.25 % IJ SOLN
INTRAMUSCULAR | Status: DC | PRN
  Administered 2019-01-27: 5 mL

## 2019-01-27 MED ORDER — CEFAZOLIN SODIUM-DEXTROSE 2-4 GM/100ML-% IV SOLN
2.0000 g | INTRAVENOUS | Status: AC
  Administered 2019-01-27: 19:00:00 2 g via INTRAVENOUS
  Filled 2019-01-27: qty 100

## 2019-01-27 MED ORDER — BUPIVACAINE HCL (PF) 0.25 % IJ SOLN
INTRAMUSCULAR | Status: AC
  Filled 2019-01-27: qty 30

## 2019-01-27 MED ORDER — POVIDONE-IODINE 10 % EX SWAB: 2.0000 "application " | Freq: Once | CUTANEOUS | Status: DC

## 2019-01-27 MED ORDER — FENTANYL CITRATE (PF) 250 MCG/5ML IJ SOLN
INTRAMUSCULAR | Status: DC | PRN
  Administered 2019-01-27: 100 ug via INTRAVENOUS

## 2019-01-27 MED ORDER — PHENYLEPHRINE HCL (PRESSORS) 10 MG/ML IV SOLN
INTRAVENOUS | Status: DC | PRN
  Administered 2019-01-27: 80 ug via INTRAVENOUS
  Administered 2019-01-27: 120 ug via INTRAVENOUS

## 2019-01-27 MED ORDER — PROPOFOL 10 MG/ML IV BOLUS
INTRAVENOUS | Status: AC
  Filled 2019-01-27: qty 20

## 2019-01-27 MED ORDER — SUCCINYLCHOLINE CHLORIDE 200 MG/10ML IV SOSY
PREFILLED_SYRINGE | INTRAVENOUS | Status: DC | PRN
  Administered 2019-01-27: 120 mg via INTRAVENOUS

## 2019-01-27 MED ORDER — FENTANYL CITRATE (PF) 100 MCG/2ML IJ SOLN: 25.0000 ug | INTRAMUSCULAR | Status: DC | PRN

## 2019-01-27 MED ORDER — CEFAZOLIN SODIUM-DEXTROSE 2-4 GM/100ML-% IV SOLN
2.0000 g | Freq: Once | INTRAVENOUS | Status: AC
  Administered 2019-01-27: 2 g via INTRAVENOUS
  Filled 2019-01-27: qty 100

## 2019-01-27 MED ORDER — 0.9 % SODIUM CHLORIDE (POUR BTL) OPTIME
TOPICAL | Status: DC | PRN
  Administered 2019-01-27: 1000 mL

## 2019-01-27 MED ORDER — SODIUM CHLORIDE 0.9 % IV SOLN
INTRAVENOUS | Status: DC
  Administered 2019-01-27: 16:00:00 via INTRAVENOUS

## 2019-01-27 MED ORDER — LIDOCAINE 2% (20 MG/ML) 5 ML SYRINGE
INTRAMUSCULAR | Status: DC | PRN
  Administered 2019-01-27: 100 mg via INTRAVENOUS

## 2019-01-27 MED ORDER — TETANUS-DIPHTH-ACELL PERTUSSIS 5-2.5-18.5 LF-MCG/0.5 IM SUSP
0.5000 mL | Freq: Once | INTRAMUSCULAR | Status: AC
  Administered 2019-01-27: 0.5 mL via INTRAMUSCULAR
  Filled 2019-01-27: qty 0.5

## 2019-01-27 MED ORDER — FUROSEMIDE 10 MG/ML IJ SOLN
10.0000 mg | Freq: Once | INTRAMUSCULAR | Status: AC
  Administered 2019-01-27: 10 mg via INTRAVENOUS

## 2019-01-27 MED ORDER — ONDANSETRON HCL 4 MG/2ML IJ SOLN
INTRAMUSCULAR | Status: DC | PRN
  Administered 2019-01-27: 4 mg via INTRAVENOUS

## 2019-01-27 MED ORDER — MIDAZOLAM HCL 2 MG/2ML IJ SOLN
INTRAMUSCULAR | Status: DC | PRN
  Administered 2019-01-27: 2 mg via INTRAVENOUS

## 2019-01-27 MED ORDER — HYDROCODONE-ACETAMINOPHEN 5-325 MG PO TABS
1.0000 | ORAL_TABLET | Freq: Once | ORAL | Status: AC
  Administered 2019-01-27: 13:00:00 1 via ORAL
  Filled 2019-01-27: qty 1

## 2019-01-27 MED ORDER — FENTANYL CITRATE (PF) 250 MCG/5ML IJ SOLN
INTRAMUSCULAR | Status: AC
  Filled 2019-01-27: qty 5

## 2019-01-27 SURGICAL SUPPLY — 55 items
BNDG COHESIVE 1X5 TAN STRL LF (GAUZE/BANDAGES/DRESSINGS) ×3 IMPLANT
BNDG CONFORM 2 STRL LF (GAUZE/BANDAGES/DRESSINGS) IMPLANT
BNDG ELASTIC 3X5.8 VLCR STR LF (GAUZE/BANDAGES/DRESSINGS) ×3 IMPLANT
BNDG ELASTIC 4X5.8 VLCR STR LF (GAUZE/BANDAGES/DRESSINGS) ×3 IMPLANT
BNDG ESMARK 4X9 LF (GAUZE/BANDAGES/DRESSINGS) ×3 IMPLANT
BNDG GAUZE ELAST 4 BULKY (GAUZE/BANDAGES/DRESSINGS) ×3 IMPLANT
CORD BIPOLAR FORCEPS 12FT (ELECTRODE) ×3 IMPLANT
COVER SURGICAL LIGHT HANDLE (MISCELLANEOUS) ×3 IMPLANT
COVER WAND RF STERILE (DRAPES) ×3 IMPLANT
CUFF TOURN SGL QUICK 18X4 (TOURNIQUET CUFF) ×3 IMPLANT
CUFF TOURN SGL QUICK 24 (TOURNIQUET CUFF)
CUFF TRNQT CYL 24X4X16.5-23 (TOURNIQUET CUFF) IMPLANT
DRAIN PENROSE 1/4X12 LTX STRL (WOUND CARE) IMPLANT
DRAPE SURG 17X23 STRL (DRAPES) ×3 IMPLANT
DRSG ADAPTIC 3X8 NADH LF (GAUZE/BANDAGES/DRESSINGS) ×3 IMPLANT
ELECT REM PT RETURN 9FT ADLT (ELECTROSURGICAL)
ELECTRODE REM PT RTRN 9FT ADLT (ELECTROSURGICAL) IMPLANT
GAUZE SPONGE 4X4 12PLY STRL (GAUZE/BANDAGES/DRESSINGS) ×3 IMPLANT
GAUZE XEROFORM 1X8 LF (GAUZE/BANDAGES/DRESSINGS) ×3 IMPLANT
GAUZE XEROFORM 5X9 LF (GAUZE/BANDAGES/DRESSINGS) IMPLANT
GLOVE BIOGEL PI IND STRL 8.5 (GLOVE) ×1 IMPLANT
GLOVE BIOGEL PI INDICATOR 8.5 (GLOVE) ×2
GLOVE SURG ORTHO 8.0 STRL STRW (GLOVE) ×3 IMPLANT
GOWN STRL REUS W/ TWL LRG LVL3 (GOWN DISPOSABLE) ×3 IMPLANT
GOWN STRL REUS W/ TWL XL LVL3 (GOWN DISPOSABLE) ×1 IMPLANT
GOWN STRL REUS W/TWL LRG LVL3 (GOWN DISPOSABLE) ×6
GOWN STRL REUS W/TWL XL LVL3 (GOWN DISPOSABLE) ×2
HANDPIECE INTERPULSE COAX TIP (DISPOSABLE)
KIT BASIN OR (CUSTOM PROCEDURE TRAY) ×3 IMPLANT
KIT TURNOVER KIT B (KITS) ×3 IMPLANT
MANIFOLD NEPTUNE II (INSTRUMENTS) ×3 IMPLANT
NEEDLE HYPO 25GX1X1/2 BEV (NEEDLE) IMPLANT
NS IRRIG 1000ML POUR BTL (IV SOLUTION) ×3 IMPLANT
PACK ORTHO EXTREMITY (CUSTOM PROCEDURE TRAY) ×3 IMPLANT
PAD ARMBOARD 7.5X6 YLW CONV (MISCELLANEOUS) ×6 IMPLANT
PAD CAST 4YDX4 CTTN HI CHSV (CAST SUPPLIES) ×1 IMPLANT
PADDING CAST COTTON 4X4 STRL (CAST SUPPLIES) ×2
SET CYSTO W/LG BORE CLAMP LF (SET/KITS/TRAYS/PACK) IMPLANT
SET HNDPC FAN SPRY TIP SCT (DISPOSABLE) IMPLANT
SOAP 2 % CHG 4 OZ (WOUND CARE) ×3 IMPLANT
SPONGE LAP 18X18 RF (DISPOSABLE) ×3 IMPLANT
SPONGE LAP 4X18 RFD (DISPOSABLE) ×3 IMPLANT
SUT ETHILON 4 0 PS 2 18 (SUTURE) IMPLANT
SUT ETHILON 5 0 P 3 18 (SUTURE)
SUT NYLON ETHILON 5-0 P-3 1X18 (SUTURE) IMPLANT
SWAB COLLECTION DEVICE MRSA (MISCELLANEOUS) ×3 IMPLANT
SWAB CULTURE ESWAB REG 1ML (MISCELLANEOUS) IMPLANT
SYR CONTROL 10ML LL (SYRINGE) IMPLANT
TOWEL GREEN STERILE (TOWEL DISPOSABLE) ×3 IMPLANT
TOWEL GREEN STERILE FF (TOWEL DISPOSABLE) ×3 IMPLANT
TUBE CONNECTING 12'X1/4 (SUCTIONS) ×1
TUBE CONNECTING 12X1/4 (SUCTIONS) ×2 IMPLANT
UNDERPAD 30X30 (UNDERPADS AND DIAPERS) ×3 IMPLANT
WATER STERILE IRR 1000ML POUR (IV SOLUTION) ×3 IMPLANT
YANKAUER SUCT BULB TIP NO VENT (SUCTIONS) ×3 IMPLANT

## 2019-01-27 NOTE — Consult Note (Signed)
Reason for Consult:Left thumb amputation  Referring Physician: Elyas [name] is an 58 y.o. male.  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 He could not find the piece. He was brought to the ED and hand surgery was consulted. He is mostly ambidextrous.  Past Medical History:  Diagnosis Date  . Diabetes mellitus, new onset ([place]) 07/2016  . Enlarged prostate   . HLD (hyperlipidemia)     Past Surgical History:  Procedure Laterality Date  . KNEE ARTHROSCOPY Right   . ORCHIECTOMY Bilateral 10/12/2018   Procedure: ORCHIECTOMY;  Surgeon: Lucas Mallow, MD;  Location: [facility];  Service: Urology;  Laterality: Bilateral;  . TRANSURETHRAL RESECTION OF PROSTATE  10/12/2018   Procedure: CYSTOSCOPY WITH URETHRAL DILATION;  Surgeon: Lucas Mallow, MD;  Location: [facility];  Service: Urology;;    Family History  Problem Relation Age of Onset  . Prostate cancer Father   . Heart disease Father   . Prostate cancer Paternal Grandfather     Social History:  reports that he quit smoking about 5 years ago. His smoking use included cigarettes. He has a 63.00 pack-year smoking history. He has never used smokeless tobacco. He reports that he does not drink alcohol or use drugs.  Allergies: No Known Allergies  Medications: I have reviewed the patient's current medications.  Results for orders placed or performed during the hospital encounter of 01/27/19 (from the past 48 hour(s))  CBC     Status: Abnormal   Collection Time: 01/27/19 12:52 PM  Result Value Ref Range   WBC 8.9 4.0 - 10.5 K/uL   RBC 4.09 (L) 4.22 - 5.81 MIL/uL   Hemoglobin 10.9 (L) 13.0 - 17.0 g/dL   HCT 36.2 (L) 39.0 - 52.0 %   MCV 88.5 80.0 - 100.0 fL   MCH 26.7 26.0 - 34.0 pg   MCHC 30.1 30.0 - 36.0 g/dL   RDW 15.9 (H) 11.5 - 15.5 %   Platelets 243 150 - 400 K/uL   nRBC 0.0 0.0 - 0.2 %    Comment: Performed at [facility], [place] [address] [place], [place] [address]  Basic metabolic panel     Status: Abnormal   Collection Time: 01/27/19 12:52 PM  Result Value Ref Range   Sodium 139 135 - 145 mmol/L   Potassium 4.1 3.5 - 5.1 mmol/L   Chloride 102 98 - 111 mmol/L   CO2 24 22 - 32 mmol/L   Glucose, Bld 198 (H) 70 - 99 mg/dL   BUN 34 (H) 6 - 20 mg/dL   Creatinine, Ser 2.05 (H) 0.61 - 1.24 mg/dL   Calcium 9.0 8.9 - 10.3 mg/dL   GFR calc non Af Amer 35 (L) >60 mL/min   GFR calc Af Amer 41 (L) >60 mL/min   Anion gap 13 5 - 15    Comment: Performed at [facility], [place] [address], [place], [place] [address]    Dg Finger Thumb Left  Result Date: 01/27/2019 CLINICAL DATA:  Amputation EXAM: LEFT THUMB 2+V COMPARISON:  None. FINDINGS: There is transverse amputation of the tuft of the left thumb distal phalanx. The thumb interphalangeal joint and proximal thumb appear intact. No radiopaque foreign body identified. Bandage material about the thumb. IMPRESSION: There is transverse amputation of the tuft of the left thumb distal phalanx. The thumb interphalangeal joint and proximal thumb appear intact.  No radiopaque foreign body identified. Electronically Signed   By: Eddie Candle M.D.   On: 01/27/2019 13:26    Review of Systems  Constitutional: Negative for weight loss.  HENT: Negative for ear discharge, ear pain, hearing loss and tinnitus.   Eyes: Negative for blurred vision, double vision, photophobia and pain.  Respiratory: Negative for cough, sputum production and shortness of breath.   Cardiovascular: Negative for chest pain.  Gastrointestinal: Negative for abdominal pain, nausea and vomiting.  Genitourinary: Negative for dysuria, flank pain, frequency and urgency.  Musculoskeletal: Positive for joint pain (Left thumb). Negative for back pain, falls, myalgias and neck pain.  Neurological: Negative for dizziness, tingling, sensory change, focal weakness, loss of consciousness and headaches.   Endo/Heme/Allergies: Does not bruise/bleed easily.  Psychiatric/Behavioral: Negative for depression, memory loss and substance abuse. The patient is not nervous/anxious.    Blood pressure (!) 141/89, pulse (!) 120, temperature 98.3 F (36.8 C), temperature source Oral, resp. rate 18, height 5\' 9"  (1.753 m), weight 117.6 kg, SpO2 94 %. Physical Exam  Constitutional: He appears well-developed and well-nourished. No distress.  HENT:  Head: Normocephalic and atraumatic.  Eyes: Conjunctivae are normal. Right eye exhibits no discharge. Left eye exhibits no discharge. No scleral icterus.  Neck: Normal range of motion.  Cardiovascular: Normal rate and regular rhythm.  Respiratory: Effort normal. No respiratory distress.  Musculoskeletal:     Comments: Left shoulder, elbow, wrist, digits- Traumatic amputation thumb just distal to IP joint, no instability, no blocks to motion  Sens  Ax/R/M/U intact  Mot   Ax/ R/ PIN/ M/ AIN/ U intact  Rad 2+  Neurological: He is alert.  Skin: Skin is warm and dry. He is not diaphoretic.  Psychiatric: He has a normal mood and affect. His behavior is normal.    Assessment/Plan: Left thumb amputation -- Will need revision amputation by Dr. Caralyn Guile at [facility] this evening. Anticipate discharge home after surgery.    Lisette Abu, PA-C Orthopedic Surgery [phone] 01/27/2019, 2:57 PM

## 2019-01-27 NOTE — Consult Note (Deleted)
  The note originally documented on this encounter has been moved the the encounter in which it belongs.  

## 2019-01-27 NOTE — Anesthesia Preprocedure Evaluation (Addendum)
Anesthesia Evaluation  Patient identified by MRN, date of birth, ID band Patient awake    Reviewed: Allergy & Precautions, NPO status , Patient's Chart, lab work & pertinent test results  Airway Mallampati: II  TM Distance: >3 FB Neck ROM: Full    Dental  (+) Dental Advisory Given, Poor Dentition, Chipped, Missing   Pulmonary shortness of breath, former smoker,    Pulmonary exam normal breath sounds clear to auscultation       Cardiovascular hypertension, (-) angina(-) CAD, (-) Past MI and (-) CABG Normal cardiovascular exam Rhythm:Regular Rate:Normal     Neuro/Psych negative neurological ROS  negative psych ROS   GI/Hepatic negative GI ROS, Neg liver ROS,   Endo/Other  diabetes, Type 2, Oral Hypoglycemic AgentsObesity   Renal/GU Renal InsufficiencyRenal disease   Prostate cancer     Musculoskeletal Left thumb amputation   Abdominal   Peds  Hematology  (+) Blood dyscrasia, anemia ,   Anesthesia Other Findings Day of surgery medications reviewed with the patient.  Reproductive/Obstetrics                            Anesthesia Physical Anesthesia Plan  ASA: II and emergent  Anesthesia Plan: General   Post-op Pain Management:    Induction: Intravenous  PONV Risk Score and Plan: 3 and Midazolam, Dexamethasone and Ondansetron  Airway Management Planned: Oral ETT  Additional Equipment:   Intra-op Plan:   Post-operative Plan: Extubation in OR  Informed Consent: I have reviewed the patients History and Physical, chart, labs and discussed the procedure including the risks, benefits and alternatives for the proposed anesthesia with the patient or authorized representative who has indicated his/her understanding and acceptance.     Dental advisory given  Plan Discussed with: CRNA  Anesthesia Plan Comments:        Anesthesia Quick Evaluation

## 2019-01-27 NOTE — Progress Notes (Signed)
Patient is seen and evaluated today. The patient sustained the amputation through the thumb distal phalanx. The wound was examined I recommend revision amputation to the left thumb. Patient voiced understanding Patient will go home following surgery. R/B/A DISCUSSED WITH PT IN OFFICE.  PT VOICED UNDERSTANDING OF PLAN CONSENT SIGNED DAY OF SURGERY PT SEEN AND EXAMINED PRIOR TO OPERATIVE PROCEDURE/DAY OF SURGERY SITE MARKED. QUESTIONS ANSWERED WILL GO HOME FOLLOWING SURGERY  WE ARE PLANNING SURGERY FOR YOUR UPPER EXTREMITY. THE RISKS AND BENEFITS OF SURGERY INCLUDE BUT NOT LIMITED TO BLEEDING INFECTION, DAMAGE TO NEARBY NERVES ARTERIES TENDONS, FAILURE OF SURGERY TO ACCOMPLISH ITS INTENDED GOALS, PERSISTENT SYMPTOMS AND NEED FOR FURTHER SURGICAL INTERVENTION. WITH THIS IN MIND WE WILL PROCEED. I HAVE DISCUSSED WITH THE PATIENT THE PRE AND POSTOPERATIVE REGIMEN AND THE DOS AND DON'TS. PT VOICED UNDERSTANDING AND INFORMED CONSENT SIGNED.

## 2019-01-27 NOTE — Anesthesia Procedure Notes (Signed)
Procedure Name: Intubation Date/Time: 01/27/2019 6:30 PM Performed by: Clearnce Sorrel, CRNA Pre-anesthesia Checklist: Patient identified, Emergency Drugs available, Suction available, Patient being monitored and Timeout performed Patient Re-evaluated:Patient Re-evaluated prior to induction Oxygen Delivery Method: Circle system utilized Preoxygenation: Pre-oxygenation with 100% oxygen Induction Type: IV induction and Rapid sequence Laryngoscope Size: Mac and 4 Grade View: Grade III Tube size: 7.5 mm Number of attempts: 1 Airway Equipment and Method: Stylet Placement Confirmation: positive ETCO2 and breath sounds checked- equal and bilateral Secured at: 23 cm Tube secured with: Tape Dental Injury: Teeth and Oropharynx as per pre-operative assessment

## 2019-01-27 NOTE — Transfer of Care (Signed)
Immediate Anesthesia Transfer of Care Note  Patient: Jeffery [name]  Procedure(s) Performed: IRRIGATION AND DEBRIDEMENT EXTREMITY (Left Thumb)  Patient Location: PACU  Anesthesia Type:General  Level of Consciousness: awake, alert , oriented, drowsy and patient cooperative  Airway & Oxygen Therapy: Patient Spontanous Breathing and Patient connected to nasal cannula oxygen  Post-op Assessment: Report given to RN and Post -op Vital signs reviewed and stable  Post vital signs: Reviewed and stable  Last Vitals:  Vitals Value Taken Time  BP 133/87 01/27/19 1858  Temp    Pulse 107 01/27/19 1902  Resp 19 01/27/19 1902  SpO2 98 % 01/27/19 1902  Vitals shown include unvalidated device data.  Last Pain:  Vitals:   01/27/19 1535  TempSrc: Oral         Complications: No apparent anesthesia complications

## 2019-01-27 NOTE — Discharge Instructions (Signed)
Please go to [facility], as described by Silvestre Gunner, PA-C, for repair of your thumb.

## 2019-01-27 NOTE — Op Note (Signed)
PREOPERATIVE DIAGNOSIS: Left thumb open distal phalanx fracture  POSTOPERATIVE DIAGNOSIS: Left thumb open distal phalanx fracture  ATTENDING SURGEON: Dr. Iran Planas who scrubbed and present for the entire procedure  ASSISTANT SURGEON: None  ANESTHESIA: General via endotracheal tube  OPERATIVE PROCEDURE: #1: Open treatment of left thumb open distal phalanx fracture without internal fixation #2: Debridement of skin subcutaneous tissue and bone associated with open left thumb distal phalanx fracture #3: Moberg advancement flap left thumb rotational flap  IMPLANTS: None  RADIOGRAPHIC INTERPRETATION: None  SURGICAL INDICATIONS: Patient is a right-hand-dominant gentleman who sustained the open left thumb distal phalanx fracture.  Patient seen and evaluated and recommended undergo the above procedure.  Risks of surgery include but not limited to bleeding infection damage nearby nerves arteries or tendons loss of motion of the wrist and digits incomplete relief of symptoms and need for further surgical intervention.  SURGICAL TECHNIQUE: Patient palpated and found the preoperative holding area marked for marker made on the left thumb to indicate the correct operative site.  Patient brought back the operating placed supine on the anesthesia table where the general anesthetic was administered.  Patient tolerates well.  Well-padded tourniquet placed on the left forearm and seal with appropriate drape.  Left upper extremities and prepped and draped normal sterile fashion.  A timeout was called the correct site was identified the procedure then begun.  The limb was elevated tourniquet insufflated.  Open debridement of the skin subcutaneous tissue and bone was then done of the open fracture.  Debridement of the devitalized tissue was done with sharp scissors and knife.  This was circumferential around the distal phalanx.  After this open treatment of the distal phalanx done without internal fixation.  In  order, cover the skin defect on the dorsal aspect of the thumb since the nail plate and nailbed have been completely avulsed off to longitudinal incisions were made along the mid axial line to the thumb elevating the volar soft tissue to create the volar advancement flap.  The wound was thoroughly irrigated.  Skin hooks were used to advance the flap.  This was then sewn with 3-0 and 5-0 nylon suture.  This covered the distal phalanx very nicely.  The tourniquet deflated.  There is good hemostasis.  Adaptic dressing sterile compressive bandage then applied.  5 cc quarter percent Marcaine infiltrated locally.  Patient was placed into sterile dressing extubated taken recovery room in good condition.  POSTOPERATIVE PLAN: Patient be discharged to home.  Seen back in the office in 9 days for wound check finger dressing sutures out around the 3-week mark.  Radiographs at the first visit.

## 2019-01-27 NOTE — ED Triage Notes (Signed)
Pt reports accidentally cutting the top of his left thumb off with a table saw. Pt reports not being able to find the top of his thumb. Pt also reports he is currently getting over pneumonia and has hx of prostate cancer.

## 2019-01-27 NOTE — ED Provider Notes (Signed)
Medical screening examination/treatment/procedure(s) were conducted as a shared visit with non-physician practitioner(s) and myself.  I personally evaluated the patient during the encounter. Briefly, the patient is a 58 y.o. male with history of diabetes who presents the ED with left thumb amputation from table saw.  Patient with unremarkable vitals.  Bleeding has stopped.  Amputation is just below the fingernail.  Amputation is at the tuft of the left thumb distal phalanx.  Patient lost other part of the thumb somewhere in his house was unable to find.  Tetanus shot to be updated.  Patient to be given IV Ancef.  Will give pain medication.  Will discuss with hand as likely will need washout and repair.  Patient otherwise with hemostatic wound.  This chart was dictated using voice recognition software.  Despite best efforts to proofread,  errors can occur which can change the documentation meaning.     EKG Interpretation None           Lennice Sites, DO 01/27/19 1338

## 2019-01-27 NOTE — Discharge Instructions (Addendum)
KEEP BANDAGE CLEAN AND DRY CALL OFFICE FOR F/U APPT [phone] in 9 days KEEP HAND ELEVATED ABOVE HEART OK TO APPLY ICE TO OPERATIVE AREA CONTACT OFFICE IF ANY WORSENING PAIN OR CONCERNS.

## 2019-01-27 NOTE — ED Provider Notes (Signed)
[facility] DEPT Provider Note   CSN: [id number] Arrival date & time: 01/27/19  1137     History   Chief Complaint Chief Complaint  Patient presents with  . Finger Injury    HPI CAMDYN BESKE is a 58 y.o. male with stage IV prostate cancer who presents to the ED after sustaining a table saw injury to his left thumb this morning.  He reportedly was cutting wood when his left thumb was "sucked in" to the blade of a table saw, severing it at the tip.  He immediately placed a towel over his partially amputated thumb and came to the ED for evaluation.  Patient complains of 8 out of 10 pain.  It is his nondominant hand.  He is unaware of his last tetanus shot.  He denies any other injury or symptoms at this time.     HPI  Past Medical History:  Diagnosis Date  . Diabetes mellitus, new onset ([place]) 07/2016  . Enlarged prostate   . HLD (hyperlipidemia)     Patient Active Problem List   Diagnosis Date Noted  . Acute renal failure superimposed on stage 3 chronic kidney disease ([place]) 10/13/2018  . Vitamin B12 deficiency 10/13/2018  . Prostate cancer metastatic to multiple sites ([place]) 10/13/2018  . Acute cystitis with hematuria   . Acute hyperkalemia   . DM2 (diabetes mellitus, type 2) ([place]) 10/12/2018  . Dysuria 10/12/2018  . CKD (chronic kidney disease) stage 3, GFR 30-59 ml/min 10/12/2018  . Hyperkalemia 10/12/2018  . Metabolic acidosis, normal anion gap (NAG) 10/12/2018  . Sinus tachycardia 07/31/2016  . Diabetes mellitus, new onset ([place])   . Hyperglycemia 07/29/2016  . Urinary retention 07/29/2016  . AKI (acute kidney injury) ([place]) 07/29/2016  . Cholelithiasis 07/29/2016  . Fatty liver 07/29/2016  . Hydronephrosis 07/29/2016  . Hypertension 07/29/2016    Past Surgical History:  Procedure Laterality Date  . KNEE ARTHROSCOPY Right   . ORCHIECTOMY Bilateral 10/12/2018   Procedure: ORCHIECTOMY;  Surgeon: Lucas Mallow, MD;   Location: [facility] ORS;  Service: Urology;  Laterality: Bilateral;  . TRANSURETHRAL RESECTION OF PROSTATE  10/12/2018   Procedure: CYSTOSCOPY WITH URETHRAL DILATION;  Surgeon: Lucas Mallow, MD;  Location: [facility] ORS;  Service: Urology;;        Home Medications    Prior to Admission medications   Medication Sig Start Date End Date Taking? Authorizing Provider  Amino Acids-Protein Hydrolys (FEEDING SUPPLEMENT, PRO-STAT SUGAR FREE 64,) LIQD Take 30 mLs by mouth 3 (three) times daily. 10/14/18   Hongalgi, Lenis Dickinson, MD  Blood Glucose Monitoring Suppl (RELION CONFIRM GLUCOSE MONITOR) w/Device KIT Use 4 times daily before meals and bedtime as directed. 07/31/16   Holley Raring, MD  feeding supplement, ENSURE ENLIVE, (ENSURE ENLIVE) LIQD Take 237 mLs by mouth daily. 10/14/18   Hongalgi, Lenis Dickinson, MD  finasteride (PROSCAR) 5 MG tablet Take 5 mg by mouth daily. 02/12/18   [provider]  furosemide (LASIX) 40 MG tablet Take 40 mg by mouth daily.    [provider]  gabapentin (NEURONTIN) 300 MG capsule Take 1 capsule (300 mg total) by mouth 3 (three) times daily. 01/12/19   Wyatt Portela, MD  glucose blood (RELION GLUCOSE TEST STRIPS) test strip Use as instructed 07/31/16   Holley Raring, MD  HYDROcodone-acetaminophen (NORCO/VICODIN) 5-325 MG tablet TAKE 1 TO 2 TABLETS BY MOUTH EVERY 4 TO 6 HOURS AS NEEDED FOR PAIN. DO NOT EXCEED 8 TABLETS IN 24  HOURS. 01/15/19   Wyatt Portela, MD  Lancets 30G MISC Use 4 times daily before meals as directed. 07/31/16   Holley Raring, MD  metFORMIN (GLUCOPHAGE-XR) 500 MG 24 hr tablet TAKE 1 TABLET BY MOUTH ONCE DAILY WITH BREAKFAST 01/12/19   [provider]  Multiple Vitamin (MULTIVITAMIN WITH MINERALS) TABS tablet Take 1 tablet by mouth daily. 10/15/18   Hongalgi, Lenis Dickinson, MD  predniSONE (DELTASONE) 5 MG tablet Take 1 tablet (5 mg total) by mouth daily with breakfast. 11/03/18   Wyatt Portela, MD  tamsulosin (FLOMAX) 0.4 MG CAPS capsule Take 2  capsules (0.8 mg total) by mouth daily after supper. 10/14/18   Hongalgi, Lenis Dickinson, MD  vitamin B-12 (CYANOCOBALAMIN) 1000 MCG tablet Take 1 tablet (1,000 mcg total) by mouth daily. 10/14/18   Hongalgi, Lenis Dickinson, MD  ZYTIGA 250 MG tablet TAKE 4 TABLETS BY MOUTH ONCE DAILY AS DIRECTED.  TAKE 1 HOUR BEFORE OR 2 HOURS AFTER A MEAL 12/18/18   Wyatt Portela, MD    Family History Family History  Problem Relation Age of Onset  . Prostate cancer Father   . Heart disease Father   . Prostate cancer Paternal Grandfather     Social History Social History   Tobacco Use  . Smoking status: Former Smoker    Packs/day: 1.50    Years: 42.00    Pack years: 63.00    Types: Cigarettes    Quit date: 2015    Years since quitting: 5.7  . Smokeless tobacco: Never Used  . Tobacco comment: 2015  Substance Use Topics  . Alcohol use: No  . Drug use: No     Allergies   Patient has no known allergies.   Review of Systems Review of Systems  All other systems reviewed and are negative.    Physical Exam Updated Vital Signs BP (!) 141/89   Pulse (!) 120   Temp 98.3 F (36.8 C) (Oral)   Resp 18   Ht 5' 9"  (1.753 m)   Wt 117.6 kg   SpO2 94%   BMI 38.29 kg/m   Physical Exam Vitals signs and nursing note reviewed. Exam conducted with a chaperone present.  Constitutional:      Appearance: Normal appearance.  HENT:     Head: Normocephalic and atraumatic.  Eyes:     General: No scleral icterus.    Conjunctiva/sclera: Conjunctivae normal.  Pulmonary:     Effort: Pulmonary effort is normal.  Musculoskeletal:     Comments: Left thumb: Amputated immediately distal to IP joint.  Hemostasis relatively achieved with direct compression.  Sensation intact.  Patient able to flex and extend IP joint. Left wrist : ROM, strength, sensation, and radial pulse intact.  Skin:    General: Skin is dry.  Neurological:     Mental Status: He is alert.     GCS: GCS eye subscore is 4. GCS verbal subscore is 5. GCS  motor subscore is 6.  Psychiatric:        Mood and Affect: Mood normal.        Behavior: Behavior normal.        Thought Content: Thought content normal.           ED Treatments / Results  Labs (all labs ordered are listed, but only abnormal results are displayed) Labs Reviewed  CBC - Abnormal; Notable for the following components:      Result Value   RBC 4.09 (*)    Hemoglobin 10.9 (*)  HCT 36.2 (*)    RDW 15.9 (*)    All other components within normal limits  BASIC METABOLIC PANEL - Abnormal; Notable for the following components:   Glucose, Bld 198 (*)    BUN 34 (*)    Creatinine, Ser 2.05 (*)    GFR calc non Af Amer 35 (*)    GFR calc Af Amer 41 (*)    All other components within normal limits  SARS CORONAVIRUS 2 BY RT PCR (HOSPITAL ORDER, [facility])    EKG None  Radiology Dg Finger Thumb Left  Result Date: 01/27/2019 CLINICAL DATA:  Amputation EXAM: LEFT THUMB 2+V COMPARISON:  None. FINDINGS: There is transverse amputation of the tuft of the left thumb distal phalanx. The thumb interphalangeal joint and proximal thumb appear intact. No radiopaque foreign body identified. Bandage material about the thumb. IMPRESSION: There is transverse amputation of the tuft of the left thumb distal phalanx. The thumb interphalangeal joint and proximal thumb appear intact. No radiopaque foreign body identified. Electronically Signed   By: Eddie Candle M.D.   On: 01/27/2019 13:26    Procedures Procedures (including critical care time)  Medications Ordered in ED Medications  HYDROcodone-acetaminophen (NORCO/VICODIN) 5-325 MG per tablet 1 tablet (1 tablet Oral Given 01/27/19 1250)  Tdap (BOOSTRIX) injection 0.5 mL (0.5 mLs Intramuscular Given 01/27/19 1250)  ceFAZolin (ANCEF) IVPB 2g/100 mL premix (0 g Intravenous Stopped 01/27/19 1413)     Initial Impression / Assessment and Plan / ED Course  I have reviewed the triage vital signs and the  nursing notes.  Pertinent labs & imaging results that were available during my care of the patient were reviewed by me and considered in my medical decision making (see chart for details).        Patient has a complete amputation immediately distal to interphalangeal joint of left thumb.  Plain films were reviewed and there is no evidence of foreign body or disturbance of interphalangeal joint.  Irrigated wound prior to obtaining plain films of his thumb.  He received 2 g Ancef IV here in the ED.   His CBC demonstrates mild anemia to 10.9 hemoglobin, but that is at or above his baseline. His BMP also demonstrates an elevated creatinine at 2.05 and a decreased GFR to 35, both of which are at or around his baseline.  Consulted with Silvestre Gunner, PA-C who subsequently discussed case with hand surgeon and he will be [facility] to [facility] for repair.  Will discharge here from [facility], patient has instructions regarding where to [facility]. Patient will be driven by his mother. He voiced understanding and is agreeable to plan.  Final Clinical Impressions(s) / ED Diagnoses   Final diagnoses:  Thumb injury, initial encounter    ED Discharge Orders    None       Corena Herter, PA-C 01/27/19 [place], [place], DO 01/27/19 1503

## 2019-01-28 ENCOUNTER — Encounter (HOSPITAL_COMMUNITY): Payer: Self-pay | Admitting: Orthopedic Surgery

## 2019-01-28 NOTE — Anesthesia Postprocedure Evaluation (Signed)
Anesthesia Post Note  Patient: Jeffery [name]  Procedure(s) Performed: IRRIGATION AND DEBRIDEMENT EXTREMITY (Left Thumb)     Patient location during evaluation: PACU Anesthesia Type: General Level of consciousness: sedated and patient cooperative Pain management: pain level controlled Vital Signs Assessment: post-procedure vital signs reviewed and stable Respiratory status: spontaneous breathing Cardiovascular status: stable Anesthetic complications: no    Last Vitals:  Vitals:   01/27/19 2115 01/27/19 2130  BP: 132/88 124/65  Pulse: (!) 110 (!) 103  Resp: (!) 25 12  Temp:    SpO2: 94% 94%    Last Pain:  Vitals:   01/27/19 2100  TempSrc:   PainSc: 0-No pain                 Nolon Nations

## 2019-01-29 ENCOUNTER — Encounter: Payer: Self-pay | Admitting: Medical Oncology

## 2019-01-29 ENCOUNTER — Ambulatory Visit
Admission: RE | Admit: 2019-01-29 | Discharge: 2019-01-29 | Disposition: A | Discharge status: Home / Self Care | Payer: Medicaid Other | Source: Ambulatory Visit | Attending: Radiation Oncology | Admitting: Radiation Oncology

## 2019-01-29 ENCOUNTER — Other Ambulatory Visit: Payer: Self-pay

## 2019-01-29 DIAGNOSIS — M25551 Pain in right hip: Secondary | ICD-10-CM | POA: Insufficient documentation

## 2019-01-29 DIAGNOSIS — Z510 Encounter for antineoplastic radiation therapy: Secondary | ICD-10-CM | POA: Diagnosis present

## 2019-01-29 DIAGNOSIS — C7951 Secondary malignant neoplasm of bone: Secondary | ICD-10-CM | POA: Insufficient documentation

## 2019-01-29 DIAGNOSIS — C61 Malignant neoplasm of prostate: Secondary | ICD-10-CM | POA: Diagnosis present

## 2019-02-01 ENCOUNTER — Other Ambulatory Visit: Payer: Self-pay

## 2019-02-01 ENCOUNTER — Ambulatory Visit
Admission: RE | Admit: 2019-02-01 | Discharge: 2019-02-01 | Disposition: A | Discharge status: Home / Self Care | Payer: Medicaid Other | Source: Ambulatory Visit | Attending: Radiation Oncology | Admitting: Radiation Oncology

## 2019-02-01 DIAGNOSIS — Z510 Encounter for antineoplastic radiation therapy: Secondary | ICD-10-CM | POA: Diagnosis not present

## 2019-02-02 ENCOUNTER — Ambulatory Visit
Admission: RE | Admit: 2019-02-02 | Discharge: 2019-02-02 | Disposition: A | Discharge status: Home / Self Care | Payer: Medicaid Other | Source: Ambulatory Visit | Attending: Radiation Oncology | Admitting: Radiation Oncology

## 2019-02-02 ENCOUNTER — Other Ambulatory Visit: Payer: Self-pay

## 2019-02-02 ENCOUNTER — Other Ambulatory Visit: Payer: Self-pay | Admitting: Oncology

## 2019-02-02 DIAGNOSIS — M7918 Myalgia, other site: Secondary | ICD-10-CM

## 2019-02-02 DIAGNOSIS — Z510 Encounter for antineoplastic radiation therapy: Secondary | ICD-10-CM | POA: Diagnosis not present

## 2019-02-02 MED FILL — HYDROCODON-APAP 5-325: 5-325 | 5 days supply | Qty: 40 | Fill #0

## 2019-02-03 ENCOUNTER — Ambulatory Visit
Admission: RE | Admit: 2019-02-03 | Discharge: 2019-02-03 | Disposition: A | Discharge status: Home / Self Care | Payer: Medicaid Other | Source: Ambulatory Visit | Attending: Radiation Oncology | Admitting: Radiation Oncology

## 2019-02-03 ENCOUNTER — Other Ambulatory Visit: Payer: Self-pay

## 2019-02-03 DIAGNOSIS — Z510 Encounter for antineoplastic radiation therapy: Secondary | ICD-10-CM | POA: Diagnosis not present

## 2019-02-03 NOTE — Progress Notes (Signed)
  Radiation Oncology         [phone] ________________________________  Name: Jeffery [name] MRN: [id number]  Date: 01/29/2019  DOB: 04-20-1960  SIMULATION AND TREATMENT PLANNING NOTE    ICD-10-CM   1. 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  NARRATIVE:  The patient was brought to the [facility].  Identity was confirmed.  All relevant records and images related to the planned course of therapy were reviewed.  The patient freely provided informed written consent to proceed with treatment after reviewing the details related to the planned course of therapy. The consent form was witnessed and verified by the simulation staff.  Then, the patient was set-up in a stable reproducible  supine position for radiation therapy.  CT images were obtained.  Surface markings were placed.  The CT images were loaded into the planning software.  Then the target and avoidance structures were contoured.  Treatment planning then occurred.  The radiation prescription was entered and confirmed.  Then, I designed and supervised the construction of a total of 3 medically necessary complex treatment devices consisting of leg positioner and MLC apertures to cover the treated hip area.  I have requested : 3D Simulation  I have requested a DVH of the following structures: Rectum, Bladder, femoral heads and target.  PLAN:  The patient will receive 30 Gy in 10 fractions to the right hip and right scapula  ________________________________  Sheral Apley. Tammi Klippel, M.D.

## 2019-02-04 ENCOUNTER — Other Ambulatory Visit: Payer: Self-pay

## 2019-02-04 ENCOUNTER — Ambulatory Visit
Admission: RE | Admit: 2019-02-04 | Discharge: 2019-02-04 | Disposition: A | Discharge status: Home / Self Care | Payer: Medicaid Other | Source: Ambulatory Visit | Attending: Radiation Oncology | Admitting: Radiation Oncology

## 2019-02-04 ENCOUNTER — Encounter: Payer: Self-pay | Admitting: Medical Oncology

## 2019-02-04 DIAGNOSIS — Z510 Encounter for antineoplastic radiation therapy: Secondary | ICD-10-CM | POA: Diagnosis not present

## 2019-02-04 DIAGNOSIS — S62522B Displaced fracture of distal phalanx of left thumb, initial encounter for open fracture: Secondary | ICD-10-CM | POA: Insufficient documentation

## 2019-02-05 ENCOUNTER — Other Ambulatory Visit: Payer: Self-pay

## 2019-02-05 ENCOUNTER — Ambulatory Visit
Admission: RE | Admit: 2019-02-05 | Discharge: 2019-02-05 | Disposition: A | Discharge status: Home / Self Care | Payer: Medicaid Other | Source: Ambulatory Visit | Attending: Radiation Oncology | Admitting: Radiation Oncology

## 2019-02-05 DIAGNOSIS — Z510 Encounter for antineoplastic radiation therapy: Secondary | ICD-10-CM | POA: Diagnosis not present

## 2019-02-08 ENCOUNTER — Ambulatory Visit
Admission: RE | Admit: 2019-02-08 | Discharge: 2019-02-08 | Disposition: A | Discharge status: Home / Self Care | Payer: Medicaid Other | Source: Ambulatory Visit | Attending: Radiation Oncology | Admitting: Radiation Oncology

## 2019-02-08 ENCOUNTER — Other Ambulatory Visit: Payer: Self-pay

## 2019-02-08 DIAGNOSIS — Z510 Encounter for antineoplastic radiation therapy: Secondary | ICD-10-CM | POA: Diagnosis not present

## 2019-02-09 ENCOUNTER — Ambulatory Visit
Admission: RE | Admit: 2019-02-09 | Discharge: 2019-02-09 | Disposition: A | Discharge status: Home / Self Care | Payer: Medicaid Other | Source: Ambulatory Visit | Attending: Radiation Oncology | Admitting: Radiation Oncology

## 2019-02-09 ENCOUNTER — Other Ambulatory Visit: Payer: Self-pay

## 2019-02-09 DIAGNOSIS — Z510 Encounter for antineoplastic radiation therapy: Secondary | ICD-10-CM | POA: Diagnosis not present

## 2019-02-10 ENCOUNTER — Ambulatory Visit
Admission: RE | Admit: 2019-02-10 | Discharge: 2019-02-10 | Disposition: A | Discharge status: Home / Self Care | Payer: Medicaid Other | Source: Ambulatory Visit | Attending: Radiation Oncology | Admitting: Radiation Oncology

## 2019-02-10 ENCOUNTER — Other Ambulatory Visit: Payer: Self-pay

## 2019-02-10 DIAGNOSIS — Z510 Encounter for antineoplastic radiation therapy: Secondary | ICD-10-CM | POA: Diagnosis not present

## 2019-02-11 ENCOUNTER — Other Ambulatory Visit: Payer: Self-pay

## 2019-02-11 ENCOUNTER — Ambulatory Visit
Admission: RE | Admit: 2019-02-11 | Discharge: 2019-02-11 | Disposition: A | Discharge status: Home / Self Care | Payer: Medicaid Other | Source: Ambulatory Visit | Attending: Radiation Oncology | Admitting: Radiation Oncology

## 2019-02-11 DIAGNOSIS — Z510 Encounter for antineoplastic radiation therapy: Secondary | ICD-10-CM | POA: Diagnosis not present

## 2019-02-11 MED FILL — GABAPENTIN 300 MG CAPSULE: 300 | 20 days supply | Qty: 60 | Fill #0

## 2019-02-11 MED FILL — predniSONE 5 MG TABS: 5 | 90 days supply | Qty: 90 | Fill #1

## 2019-02-12 ENCOUNTER — Ambulatory Visit
Admission: RE | Admit: 2019-02-12 | Discharge: 2019-02-12 | Disposition: A | Discharge status: Home / Self Care | Payer: Medicaid Other | Source: Ambulatory Visit | Attending: Radiation Oncology | Admitting: Radiation Oncology

## 2019-02-12 ENCOUNTER — Other Ambulatory Visit: Payer: Self-pay

## 2019-02-12 ENCOUNTER — Encounter: Payer: Self-pay | Admitting: Radiation Oncology

## 2019-02-12 DIAGNOSIS — Z510 Encounter for antineoplastic radiation therapy: Secondary | ICD-10-CM | POA: Diagnosis not present

## 2019-02-16 ENCOUNTER — Other Ambulatory Visit: Payer: Self-pay | Admitting: Oncology

## 2019-02-16 DIAGNOSIS — M7918 Myalgia, other site: Secondary | ICD-10-CM

## 2019-02-16 MED FILL — HYDROCODON-APAP 5-325: 5-325 | 5 days supply | Qty: 40 | Fill #0

## 2019-02-18 DIAGNOSIS — M542 Cervicalgia: Secondary | ICD-10-CM | POA: Insufficient documentation

## 2019-02-23 DIAGNOSIS — M546 Pain in thoracic spine: Secondary | ICD-10-CM | POA: Insufficient documentation

## 2019-02-23 DIAGNOSIS — M5412 Radiculopathy, cervical region: Secondary | ICD-10-CM | POA: Insufficient documentation

## 2019-02-23 MED FILL — tiZANidine HCL 4 MG TABS: 4 | 30 days supply | Qty: 90 | Fill #0

## 2019-03-01 ENCOUNTER — Other Ambulatory Visit: Payer: Self-pay | Admitting: Oncology

## 2019-03-01 DIAGNOSIS — M7918 Myalgia, other site: Secondary | ICD-10-CM

## 2019-03-01 MED FILL — HYDROCODON-APAP 5-325: 5-325 | 5 days supply | Qty: 40 | Fill #0

## 2019-03-05 ENCOUNTER — Other Ambulatory Visit: Payer: Self-pay

## 2019-03-05 ENCOUNTER — Inpatient Hospital Stay: Payer: Medicaid Other | Attending: Oncology

## 2019-03-05 DIAGNOSIS — R5383 Other fatigue: Secondary | ICD-10-CM | POA: Diagnosis not present

## 2019-03-05 DIAGNOSIS — R609 Edema, unspecified: Secondary | ICD-10-CM | POA: Insufficient documentation

## 2019-03-05 DIAGNOSIS — R591 Generalized enlarged lymph nodes: Secondary | ICD-10-CM | POA: Diagnosis not present

## 2019-03-05 DIAGNOSIS — Z9079 Acquired absence of other genital organ(s): Secondary | ICD-10-CM | POA: Insufficient documentation

## 2019-03-05 DIAGNOSIS — C61 Malignant neoplasm of prostate: Secondary | ICD-10-CM | POA: Diagnosis not present

## 2019-03-05 DIAGNOSIS — Z79899 Other long term (current) drug therapy: Secondary | ICD-10-CM | POA: Insufficient documentation

## 2019-03-05 DIAGNOSIS — I10 Essential (primary) hypertension: Secondary | ICD-10-CM | POA: Insufficient documentation

## 2019-03-05 LAB — CBC WITH DIFFERENTIAL (CANCER CENTER ONLY)
Abs Immature Granulocytes: 0.05 10*3/uL (ref 0.00–0.07)
Basophils Absolute: 0 10*3/uL (ref 0.0–0.1)
Basophils Relative: 0 %
Eosinophils Absolute: 0.2 10*3/uL (ref 0.0–0.5)
Eosinophils Relative: 3 %
HCT: 32.7 % — ABNORMAL LOW (ref 39.0–52.0)
Hemoglobin: 9.8 g/dL — ABNORMAL LOW (ref 13.0–17.0)
Immature Granulocytes: 1 %
Lymphocytes Relative: 16 %
Lymphs Abs: 0.8 10*3/uL (ref 0.7–4.0)
MCH: 26 pg (ref 26.0–34.0)
MCHC: 30 g/dL (ref 30.0–36.0)
MCV: 86.7 fL (ref 80.0–100.0)
Monocytes Absolute: 0.4 10*3/uL (ref 0.1–1.0)
Monocytes Relative: 7 %
Neutro Abs: 3.8 10*3/uL (ref 1.7–7.7)
Neutrophils Relative %: 73 %
Platelet Count: 216 10*3/uL (ref 150–400)
RBC: 3.77 MIL/uL — ABNORMAL LOW (ref 4.22–5.81)
RDW: 16.9 % — ABNORMAL HIGH (ref 11.5–15.5)
WBC Count: 5.2 10*3/uL (ref 4.0–10.5)
nRBC: 0 % (ref 0.0–0.2)

## 2019-03-05 LAB — CMP (CANCER CENTER ONLY)
ALT: 14 U/L (ref 0–44)
AST: 15 U/L (ref 15–41)
Albumin: 3.2 g/dL — ABNORMAL LOW (ref 3.5–5.0)
Alkaline Phosphatase: 315 U/L — ABNORMAL HIGH (ref 38–126)
Anion gap: 12 (ref 5–15)
BUN: 33 mg/dL — ABNORMAL HIGH (ref 6–20)
CO2: 27 mmol/L (ref 22–32)
Calcium: 9 mg/dL (ref 8.9–10.3)
Chloride: 103 mmol/L (ref 98–111)
Creatinine: 1.94 mg/dL — ABNORMAL HIGH (ref 0.61–1.24)
GFR, Est AFR Am: 44 mL/min — ABNORMAL LOW (ref >60)
GFR, Estimated: 38 mL/min — ABNORMAL LOW (ref >60)
Glucose, Bld: 248 mg/dL — ABNORMAL HIGH (ref 70–99)
Potassium: 4.2 mmol/L (ref 3.5–5.1)
Sodium: 142 mmol/L (ref 135–145)
Total Bilirubin: 0.3 mg/dL (ref 0.3–1.2)
Total Protein: 7.2 g/dL (ref 6.5–8.1)

## 2019-03-06 LAB — PROSTATE-SPECIFIC AG, SERUM (LABCORP): Prostate Specific Ag, Serum: 11.4 ng/mL — ABNORMAL HIGH (ref 0.0–4.0)

## 2019-03-08 ENCOUNTER — Other Ambulatory Visit: Payer: Self-pay

## 2019-03-08 ENCOUNTER — Inpatient Hospital Stay (HOSPITAL_BASED_OUTPATIENT_CLINIC_OR_DEPARTMENT_OTHER): Payer: Medicaid Other | Admitting: Oncology

## 2019-03-08 ENCOUNTER — Telehealth: Payer: Self-pay | Admitting: Oncology

## 2019-03-08 VITALS — BP 127/83 | HR 91 | Temp 97.8°F | Resp 18 | Ht 69.0 in | Wt 236.2 lb

## 2019-03-08 DIAGNOSIS — C61 Malignant neoplasm of prostate: Secondary | ICD-10-CM | POA: Diagnosis not present

## 2019-03-08 DIAGNOSIS — J90 Pleural effusion, not elsewhere classified: Secondary | ICD-10-CM | POA: Diagnosis not present

## 2019-03-08 MED FILL — GABAPENTIN 300 MG CAPSULE: 300 | 20 days supply | Qty: 60 | Fill #1

## 2019-03-08 NOTE — Progress Notes (Signed)
Hematology and Oncology Follow Up Visit  Jeffery [name] [id number] 07-08-1960 58 y.o. 03/08/2019 9:06 AM [name], MDElkins, [name], *   Principle Diagnosis: 57 year old man with castration-sensitive prostate cancer diagnosed in 2020.  He presented with Gleason score 9 with a PSA of 410 and lymphadenopathy.   Prior Therapy: He is status post biopsy obtained on 09/17/2018 for his prostate performed by Dr. Gloriann Loan.  This showed a Gleason score of 9 and 12 cores.  He is status post orchiectomy completed on October 12, 2018.   Current therapy: Zytiga 1000 mg daily started in August 2020.  Interim History: Jeffery [name] returns today for a repeat evaluation.  Since her last visit, he reports no major changes in his health.  He continues to tolerate Zytiga without any major complaints.  He denies any nausea, vomiting or abdominal pain.  He does report some mild fatigue and tiredness.  He does report improvement in his lower extremity edema.  He denies any shortness of breath or difficulty breathing.  His performance status quality of life remains unchanged.   Patient denied any alteration mental status, neuropathy, confusion or dizziness.  Denies any headaches or lethargy.  Denies any night sweats, weight loss or changes in appetite.  Denied orthopnea, dyspnea on exertion or chest discomfort.  Denies shortness of breath, difficulty breathing hemoptysis or cough.  Denies any abdominal distention, nausea, early satiety or dyspepsia.  Denies any hematuria, frequency, dysuria or nocturia.  Denies any skin irritation, dryness or rash.  Denies any ecchymosis or petechiae.  Denies any lymphadenopathy or clotting.  Denies any heat or cold intolerance.  Denies any anxiety or depression.  Remaining review of system is negative.          Medications: Updated without any changes. Current Outpatient Medications  Medication Sig Dispense Refill  . Amino Acids-Protein Hydrolys (FEEDING  SUPPLEMENT, PRO-STAT SUGAR FREE 64,) LIQD Take 30 mLs by mouth 3 (three) times daily. (Patient not taking: Reported on 01/27/2019) 887 mL 0  . Blood Glucose Monitoring Suppl (RELION CONFIRM GLUCOSE MONITOR) w/Device KIT Use 4 times daily before meals and bedtime as directed. 1 kit 0  . feeding supplement, ENSURE ENLIVE, (ENSURE ENLIVE) LIQD Take 237 mLs by mouth daily. 237 mL 12  . finasteride (PROSCAR) 5 MG tablet Take 5 mg by mouth daily.    . furosemide (LASIX) 40 MG tablet Take 40 mg by mouth daily.    [name] gabapentin (NEURONTIN) 300 MG capsule Take 1 capsule (300 mg total) by mouth 3 (three) times daily. (Patient taking differently: Take 300-600 mg by mouth See admin instructions. '300mg'$  in the morning and '600mg'$  in afternoon) 60 capsule 1  . glucose blood (RELION GLUCOSE TEST STRIPS) test strip Use as instructed 100 each 12  . HYDROcodone-acetaminophen (NORCO/VICODIN) 5-325 MG tablet TAKE 1 - 2 TABLETS BY MOUTH EVERY 4 - 6 HOURS AS NEEDED FOR PAIN **DO NOT EXCEED 8 TABLETS IN 24 HOURS** 40 tablet 0  . Lancets 30G MISC Use 4 times daily before meals as directed. 100 each 0  . metFORMIN (GLUCOPHAGE-XR) 500 MG 24 hr tablet Take 500 mg by mouth 2 (two) times daily.     . Multiple Vitamin (MULTIVITAMIN WITH MINERALS) TABS tablet Take 1 tablet by mouth daily.    . predniSONE (DELTASONE) 5 MG tablet Take 1 tablet (5 mg total) by mouth daily with breakfast. 90 tablet 3  . tamsulosin (FLOMAX) 0.4 MG CAPS capsule Take 2 capsules (0.8 mg total) by mouth daily  after supper.    . vitamin B-12 (CYANOCOBALAMIN) 1000 MCG tablet Take 1 tablet (1,000 mcg total) by mouth daily. 30 tablet 0  . ZYTIGA 250 MG tablet TAKE 4 TABLETS BY MOUTH ONCE DAILY AS DIRECTED.  TAKE 1 HOUR BEFORE OR 2 HOURS AFTER A MEAL (Patient taking differently: Take 1,000 mg by mouth daily. Take one hour before or 2 hours after a meal.) 120 tablet 10   No current facility-administered medications for this visit.      Allergies: No Known  Allergies  Past Medical History, Surgical history, Social history, and Family History unchanged on review.   Physical Exam:  Blood pressure 127/83, pulse 91, temperature 97.8 F (36.6 C), temperature source Temporal, resp. rate 18, height '5\' 9"'$  (1.753 m), weight 236 lb 3.2 oz (107.1 kg), SpO2 91 %.    ECOG: 1    General appearance: Comfortable appearing without any discomfort Head: Normocephalic without any trauma Oropharynx: Mucous membranes are moist and pink without any thrush or ulcers. Eyes: Pupils are equal and round reactive to light. Lymph nodes: No cervical, supraclavicular, inguinal or axillary lymphadenopathy.   Heart:regular rate and rhythm.  S1 and S2 without leg edema. Lung: Clear without any rhonchi or wheezes.  No dullness to percussion. Abdomin: Soft, nontender, nondistended with good bowel sounds.  No hepatosplenomegaly. Musculoskeletal: No joint deformity or effusion.  Full range of motion noted. Neurological: No deficits noted on motor, sensory and deep tendon reflex exam. Skin: No petechial rash or dryness.  Appeared moist.        Lab Results: Lab Results  Component Value Date   WBC 5.2 03/05/2019   HGB 9.8 (L) 03/05/2019   HCT 32.7 (L) 03/05/2019   MCV 86.7 03/05/2019   PLT 216 03/05/2019     Chemistry      Component Value Date/Time   NA 142 03/05/2019 0755   K 4.2 03/05/2019 0755   CL 103 03/05/2019 0755   CO2 27 03/05/2019 0755   BUN 33 (H) 03/05/2019 0755   CREATININE 1.94 (H) 03/05/2019 0755      Component Value Date/Time   CALCIUM 9.0 03/05/2019 0755   ALKPHOS 315 (H) 03/05/2019 0755   AST 15 03/05/2019 0755   ALT 14 03/05/2019 0755   BILITOT 0.3 03/05/2019 0755      Results for Jeffery [name], Jeffery [name] (MRN [id number]) as of 03/08/2019 09:07  Ref. Range 01/22/2019 11:20 03/05/2019 07:55  Prostate Specific Ag, Serum Latest Ref Range: 0.0 - 4.0 ng/mL 19.8 (H) 11.4 (H)      Impression and Plan:   58 year old man with:  1.   Avanced prostate cancer with lymphadenopathy noted in June 2020.  He presented with castration-sensitive disease.    He continues to be on Zytiga with a reasonable PSA response at this time currently at 11.4 which is declined from 19.8.  Risks and benefits of continuing this medication long-term was discussed today.  Long-term issues such as hypertension, adrenal insufficiency among others were reviewed.  For the time being is agreeable to continue.  Different salvage therapy will be required in the future that would include Xtandi potentially systemic chemotherapy potentially palliative radiation therapy.   2.  Androgen deprivation therapy: He is status post bilateral orchiectomy.  No additional androgen deprivation is needed.  3.  Bone directed therapy: He is currently on calcium and vitamin D supplements.  4.  Prognosis and goals of care: His disease is incurable although aggressive measures are warranted given his reasonable performance status.  5.  Hypertension: His blood pressure is adequate at this time and does not require any additional antihypertensive medication due to Zytiga.  6.  Fluid retention: Related to Zytiga and improved at this time.  7. Follow-up: In in 4 to 6 weeks for repeat evaluation.  25  minutes was spent with the patient face-to-face today.  More than 50% of time was dedicated to reviewing laboratory data, disease status update as well as long term future plan of care.Zola Button, MD 11/23/20209:06 AM therapy:

## 2019-03-08 NOTE — Telephone Encounter (Signed)
Gave avs and calendar ° °

## 2019-03-16 ENCOUNTER — Other Ambulatory Visit: Payer: Self-pay | Admitting: Oncology

## 2019-03-16 DIAGNOSIS — M7918 Myalgia, other site: Secondary | ICD-10-CM

## 2019-03-16 MED FILL — HYDROCODON-APAP 5-325: 5-325 | 5 days supply | Qty: 40 | Fill #0

## 2019-03-29 ENCOUNTER — Other Ambulatory Visit: Payer: Self-pay | Admitting: Oncology

## 2019-03-29 DIAGNOSIS — M7918 Myalgia, other site: Secondary | ICD-10-CM

## 2019-03-29 MED FILL — HYDROCODON-APAP 5-325: 5-325 | 5 days supply | Qty: 40 | Fill #0

## 2019-03-29 MED FILL — GABAPENTIN 300 MG CAPSULE: 300 | 30 days supply | Qty: 90 | Fill #2

## 2019-03-31 ENCOUNTER — Encounter: Payer: Self-pay | Admitting: Urology

## 2019-03-31 ENCOUNTER — Other Ambulatory Visit: Payer: Self-pay

## 2019-03-31 ENCOUNTER — Ambulatory Visit
Admission: RE | Admit: 2019-03-31 | Discharge: 2019-03-31 | Disposition: A | Discharge status: Home / Self Care | Payer: Medicaid Other | Source: Ambulatory Visit | Attending: Urology | Admitting: Urology

## 2019-03-31 DIAGNOSIS — C7952 Secondary malignant neoplasm of bone marrow: Secondary | ICD-10-CM

## 2019-03-31 DIAGNOSIS — C61 Malignant neoplasm of prostate: Secondary | ICD-10-CM

## 2019-03-31 DIAGNOSIS — C7951 Secondary malignant neoplasm of bone: Secondary | ICD-10-CM

## 2019-03-31 NOTE — Progress Notes (Signed)
Radiation Oncology         (336) [phone] ________________________________  Name: ANTONIA CULBERTSON MRN: [id number]  Date: 03/31/2019  DOB: [birth year]  Post Treatment Note  CC: [name], MD  [name], *  Diagnosis:   58 y.o. 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  Interval Since Last Radiation:  6.5 weeks  02/01/19 - 02/12/19:  The patient received 30 Gy in 10 fractions to the painful sites of metastatic disease in the right hip and right scapula.  Narrative:  I spoke with the patient to conduct his routine scheduled 1 month follow up visit via telephone to spare the patient unnecessary potential exposure in the healthcare setting during the current COVID-19 pandemic.  The patient was notified in advance and gave permission to proceed with this visit format. He tolerated radiation treatments very well overall with improved pain in the right shoulder/scapula and right hip by the end of treatment. He did not experience any ill side effects.                              On review of systems, the patient states that he is doing well overall with improved hip and scapular pain since completing his recent course of palliative radiation.  He reports that the right hip pain is 50-60% improved from prior to radiation and that he is able to ambulate pain free.  The only time the hip bothers him at this point is if he crosses his legs in the recliner.  The pain resolves once he uncrosses his legs. He denies any bowel or bladder dysfunction. He does continue with intermittent low back pain, in the center of his lower back, which remains tolerable at this time.  He also has intermittent neck pain and had an MRI of his neck and upper back last week at [facility] and has a consult visit scheduled with Dr. Nelva Bush on 03/31/19 for further evaluation of neck pain/upper back pain.  He continues to tolerate the Zytiga well and PSA  continues to respond appropriately with his most recent PSA on 03/05/19 at 11.4, decreased from 19.8 in 01/2019 and 33.3 in 12/2018.  The current plan is to continue on daily Zytiga/predinsone under the care and direction of Dr. Alen Blew.  He is s/p bilateral orchiectomy on 10/12/18 for permanent ADT.  ALLERGIES:  has No Known Allergies.  Meds: Current Outpatient Medications  Medication Sig Dispense Refill  . Amino Acids-Protein Hydrolys (FEEDING SUPPLEMENT, PRO-STAT SUGAR FREE 64,) LIQD Take 30 mLs by mouth 3 (three) times daily. (Patient not taking: Reported on 01/27/2019) 887 mL 0  . Blood Glucose Monitoring Suppl (RELION CONFIRM GLUCOSE MONITOR) w/Device KIT Use 4 times daily before meals and bedtime as directed. 1 kit 0  . feeding supplement, ENSURE ENLIVE, (ENSURE ENLIVE) LIQD Take 237 mLs by mouth daily. 237 mL 12  . finasteride (PROSCAR) 5 MG tablet Take 5 mg by mouth daily.    . furosemide (LASIX) 40 MG tablet Take 40 mg by mouth daily.    [name] gabapentin (NEURONTIN) 300 MG capsule Take 1 capsule (300 mg total) by mouth 3 (three) times daily. (Patient taking differently: Take 300-600 mg by mouth See admin instructions. 321m in the morning and 6047min afternoon) 60 capsule 1  . glucose blood (RELION GLUCOSE TEST STRIPS) test strip Use as instructed 100 each 12  . HYDROcodone-acetaminophen (NORCO/VICODIN) 5-325  MG tablet TAKE 1 - 2 TABLETS BY MOUTH EVERY 4 - 6 HOURS AS NEEDED FOR PAIN **MAX OF 8 TABLETS IN 24 HOURS** 40 tablet 0  . Lancets 30G MISC Use 4 times daily before meals as directed. 100 each 0  . metFORMIN (GLUCOPHAGE-XR) 500 MG 24 hr tablet Take 500 mg by mouth 2 (two) times daily.     . Multiple Vitamin (MULTIVITAMIN WITH MINERALS) TABS tablet Take 1 tablet by mouth daily.    . predniSONE (DELTASONE) 5 MG tablet Take 1 tablet (5 mg total) by mouth daily with breakfast. 90 tablet 3  . tamsulosin (FLOMAX) 0.4 MG CAPS capsule Take 2 capsules (0.8 mg total) by mouth daily after supper.      . vitamin B-12 (CYANOCOBALAMIN) 1000 MCG tablet Take 1 tablet (1,000 mcg total) by mouth daily. 30 tablet 0  . ZYTIGA 250 MG tablet TAKE 4 TABLETS BY MOUTH ONCE DAILY AS DIRECTED.  TAKE 1 HOUR BEFORE OR 2 HOURS AFTER A MEAL (Patient taking differently: Take 1,000 mg by mouth daily. Take one hour before or 2 hours after a meal.) 120 tablet 10   No current facility-administered medications for this encounter.    Physical Findings:  vitals were not taken for this visit.   /10 Unable to assess due to telephone follow up visit format.  Lab Findings: Lab Results  Component Value Date   WBC 5.2 03/05/2019   HGB 9.8 (L) 03/05/2019   HCT 32.7 (L) 03/05/2019   MCV 86.7 03/05/2019   PLT 216 03/05/2019     Radiographic Findings: No results found.  Impression/Plan: 1. 58 y.o. 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 He appears to have recovered well from the effects of his recent palliative radiotherapy and has had significant improvement in his right hip and right shoulder/scapula pain.  He continues to tolerate the Zytiga well and PSA continues to respond appropriately with his most recent PSA on 03/05/19 at 11.4.  The current plan is to continue on daily Zytiga/predinsone under the care and direction of Dr. Alen Blew.  He is s/p bilateral orchiectomy on 10/12/18 for permanent ADT.  His next scheduled follow up with Dr. Alen Blew is on 04/22/2019 who will continue to follow him regularly for continued systemic disease management. 2. 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  He is interested in proceeding with palliative radiation to the painful sites of disease in the lumbar spine but prefers to wait until Jan. 2021.  He is scheduled for a FUN visit on 04/20/19 with  CT SIM to follow for treatment planning purposes in anticipation of beginning a 10 day course of palliative radiation to the lumbar spine shortly thereafter.  I advised him to call sooner should he develop any progressive symptoms or uncontrolled back pain as we could move forward with palliative radiotherapy sooner prn. He reports his understanding and compliance.     Nicholos Johns, PA-C

## 2019-04-01 MED FILL — GABAPENTIN 300 MG CAPSULE: 300 | 30 days supply | Qty: 150 | Fill #0

## 2019-04-04 NOTE — Progress Notes (Signed)
  Radiation Oncology         [phone]) [phone] ________________________________  Name: Jeffery [name] MRN: [id number]  Date: 02/12/2019  DOB: 09-15-1960  End of Treatment Note  Diagnosis:   58 y.o. 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     Indication for treatment:  palliation       Radiation treatment dates:   02/01/19-02/12/19  Site/dose:    1) The right hip was treated to 30 Gy in 10 fractions of 3 Gy 2) The right scapula was treated  to 30 Gy in 10 fractions of 3 Gy  Beams/energy:    1) The right hip was treated using a 3 field technique with 2 reduced fields with up to 15 MV X-rays 2) The right scapula was treated using anterior and posterior fields  Narrative: The patient tolerated radiation treatment relatively well.   His pain improved.  Plan: The patient has completed radiation treatment. The patient will return to radiation oncology clinic for routine followup in one month. I advised him to call or return sooner if he has any questions or concerns related to his recovery or treatment. ________________________________  Sheral Apley. Tammi Klippel, M.D.

## 2019-04-12 ENCOUNTER — Other Ambulatory Visit: Payer: Self-pay | Admitting: Oncology

## 2019-04-12 DIAGNOSIS — M7918 Myalgia, other site: Secondary | ICD-10-CM

## 2019-04-12 MED FILL — HYDROCODON-APAP 5-325: 5-325 | 5 days supply | Qty: 40 | Fill #0

## 2019-04-15 ENCOUNTER — Emergency Department (HOSPITAL_COMMUNITY): Payer: Medicaid Other

## 2019-04-15 ENCOUNTER — Encounter (HOSPITAL_COMMUNITY): Payer: Self-pay | Admitting: *Deleted

## 2019-04-15 ENCOUNTER — Other Ambulatory Visit: Payer: Self-pay

## 2019-04-15 ENCOUNTER — Telehealth: Payer: Self-pay | Admitting: Radiation Oncology

## 2019-04-15 ENCOUNTER — Other Ambulatory Visit: Payer: Self-pay | Admitting: Radiation Oncology

## 2019-04-15 ENCOUNTER — Emergency Department (HOSPITAL_COMMUNITY)
Admission: EM | Admit: 2019-04-15 | Discharge: 2019-04-15 | Disposition: A | Discharge status: Home / Self Care | Payer: Medicaid Other | Attending: Emergency Medicine | Admitting: Emergency Medicine

## 2019-04-15 DIAGNOSIS — C61 Malignant neoplasm of prostate: Secondary | ICD-10-CM | POA: Diagnosis not present

## 2019-04-15 DIAGNOSIS — Z7984 Long term (current) use of oral hypoglycemic drugs: Secondary | ICD-10-CM | POA: Diagnosis not present

## 2019-04-15 DIAGNOSIS — N183 Chronic kidney disease, stage 3 unspecified: Secondary | ICD-10-CM | POA: Diagnosis not present

## 2019-04-15 DIAGNOSIS — E1122 Type 2 diabetes mellitus with diabetic chronic kidney disease: Secondary | ICD-10-CM | POA: Insufficient documentation

## 2019-04-15 DIAGNOSIS — Z79899 Other long term (current) drug therapy: Secondary | ICD-10-CM | POA: Diagnosis not present

## 2019-04-15 DIAGNOSIS — I129 Hypertensive chronic kidney disease with stage 1 through stage 4 chronic kidney disease, or unspecified chronic kidney disease: Secondary | ICD-10-CM | POA: Insufficient documentation

## 2019-04-15 DIAGNOSIS — Z87891 Personal history of nicotine dependence: Secondary | ICD-10-CM | POA: Insufficient documentation

## 2019-04-15 DIAGNOSIS — R200 Anesthesia of skin: Secondary | ICD-10-CM | POA: Diagnosis present

## 2019-04-15 LAB — BASIC METABOLIC PANEL
Anion gap: 11 (ref 5–15)
BUN: 33 mg/dL — ABNORMAL HIGH (ref 6–20)
CO2: 26 mmol/L (ref 22–32)
Calcium: 8.8 mg/dL — ABNORMAL LOW (ref 8.9–10.3)
Chloride: 103 mmol/L (ref 98–111)
Creatinine, Ser: 1.8 mg/dL — ABNORMAL HIGH (ref 0.61–1.24)
GFR calc Af Amer: 48 mL/min — ABNORMAL LOW (ref >60)
GFR calc non Af Amer: 41 mL/min — ABNORMAL LOW (ref >60)
Glucose, Bld: 122 mg/dL — ABNORMAL HIGH (ref 70–99)
Potassium: 4.1 mmol/L (ref 3.5–5.1)
Sodium: 140 mmol/L (ref 135–145)

## 2019-04-15 LAB — CBC WITH DIFFERENTIAL/PLATELET
Abs Immature Granulocytes: 0.1 10*3/uL — ABNORMAL HIGH (ref 0.00–0.07)
Basophils Absolute: 0 10*3/uL (ref 0.0–0.1)
Basophils Relative: 0 %
Eosinophils Absolute: 0.1 10*3/uL (ref 0.0–0.5)
Eosinophils Relative: 1 %
HCT: 31 % — ABNORMAL LOW (ref 39.0–52.0)
Hemoglobin: 9 g/dL — ABNORMAL LOW (ref 13.0–17.0)
Immature Granulocytes: 1 %
Lymphocytes Relative: 13 %
Lymphs Abs: 0.9 10*3/uL (ref 0.7–4.0)
MCH: 25.5 pg — ABNORMAL LOW (ref 26.0–34.0)
MCHC: 29 g/dL — ABNORMAL LOW (ref 30.0–36.0)
MCV: 87.8 fL (ref 80.0–100.0)
Monocytes Absolute: 0.6 10*3/uL (ref 0.1–1.0)
Monocytes Relative: 9 %
Neutro Abs: 5.5 10*3/uL (ref 1.7–7.7)
Neutrophils Relative %: 76 %
Platelets: 240 10*3/uL (ref 150–400)
RBC: 3.53 MIL/uL — ABNORMAL LOW (ref 4.22–5.81)
RDW: 17.8 % — ABNORMAL HIGH (ref 11.5–15.5)
WBC: 7.3 10*3/uL (ref 4.0–10.5)
nRBC: 0 % (ref 0.0–0.2)

## 2019-04-15 IMAGING — CR DG CHEST 2V
2 series · 2 of 2 positions shown · non-contrast
Comparison: Chest radiograph [DATE]

CLINICAL DATA: Pt states he has Ca with Mets, Had a MRI which show
massive spinal involvement that he feels is cause of pain. Copy with
him. He had pneumonia last month, states Rt chest feels cold. Smoked
for 40 years. Hx of HTN and DM.

EXAM:
CHEST - 2 VIEW

[w chest pa]
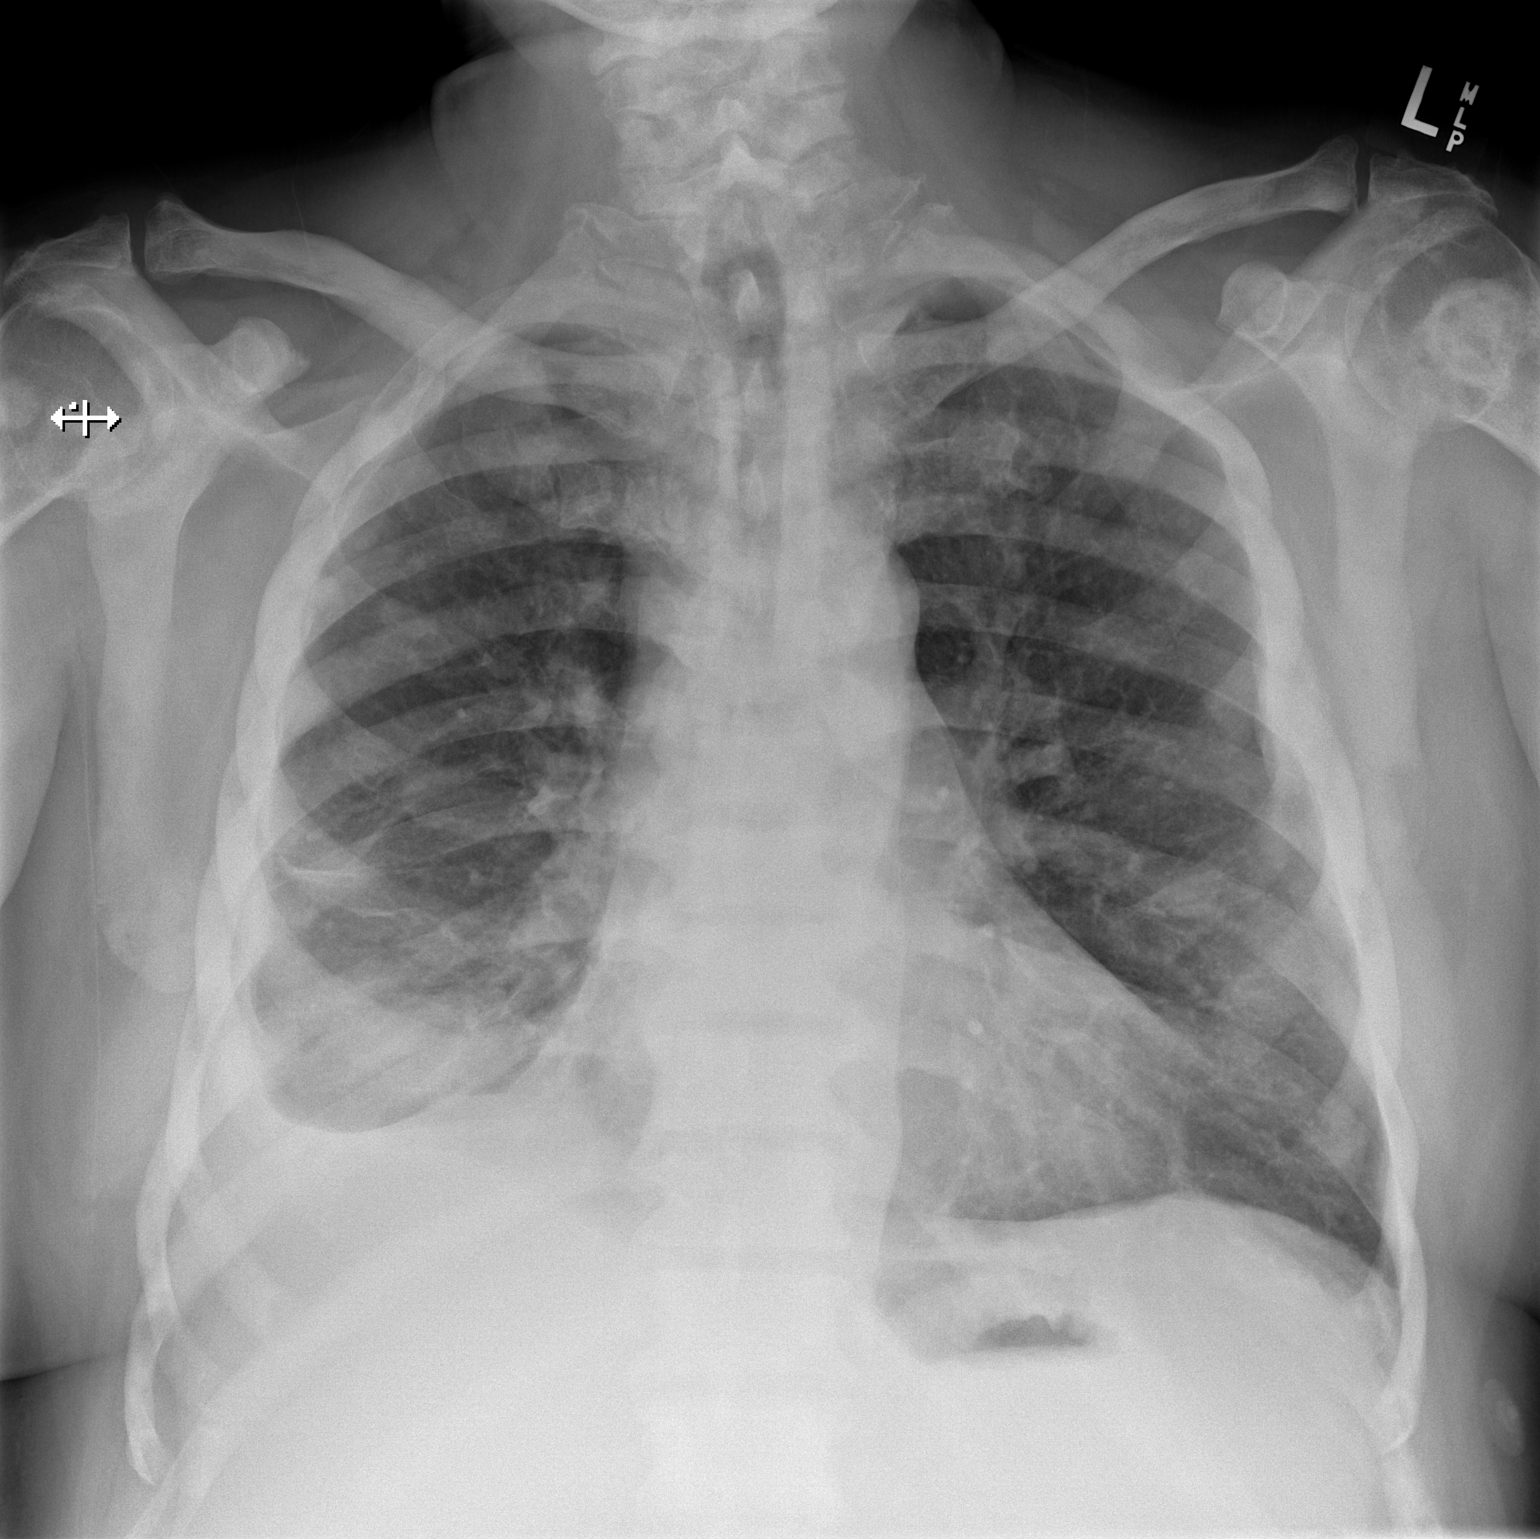

[w chest lat]
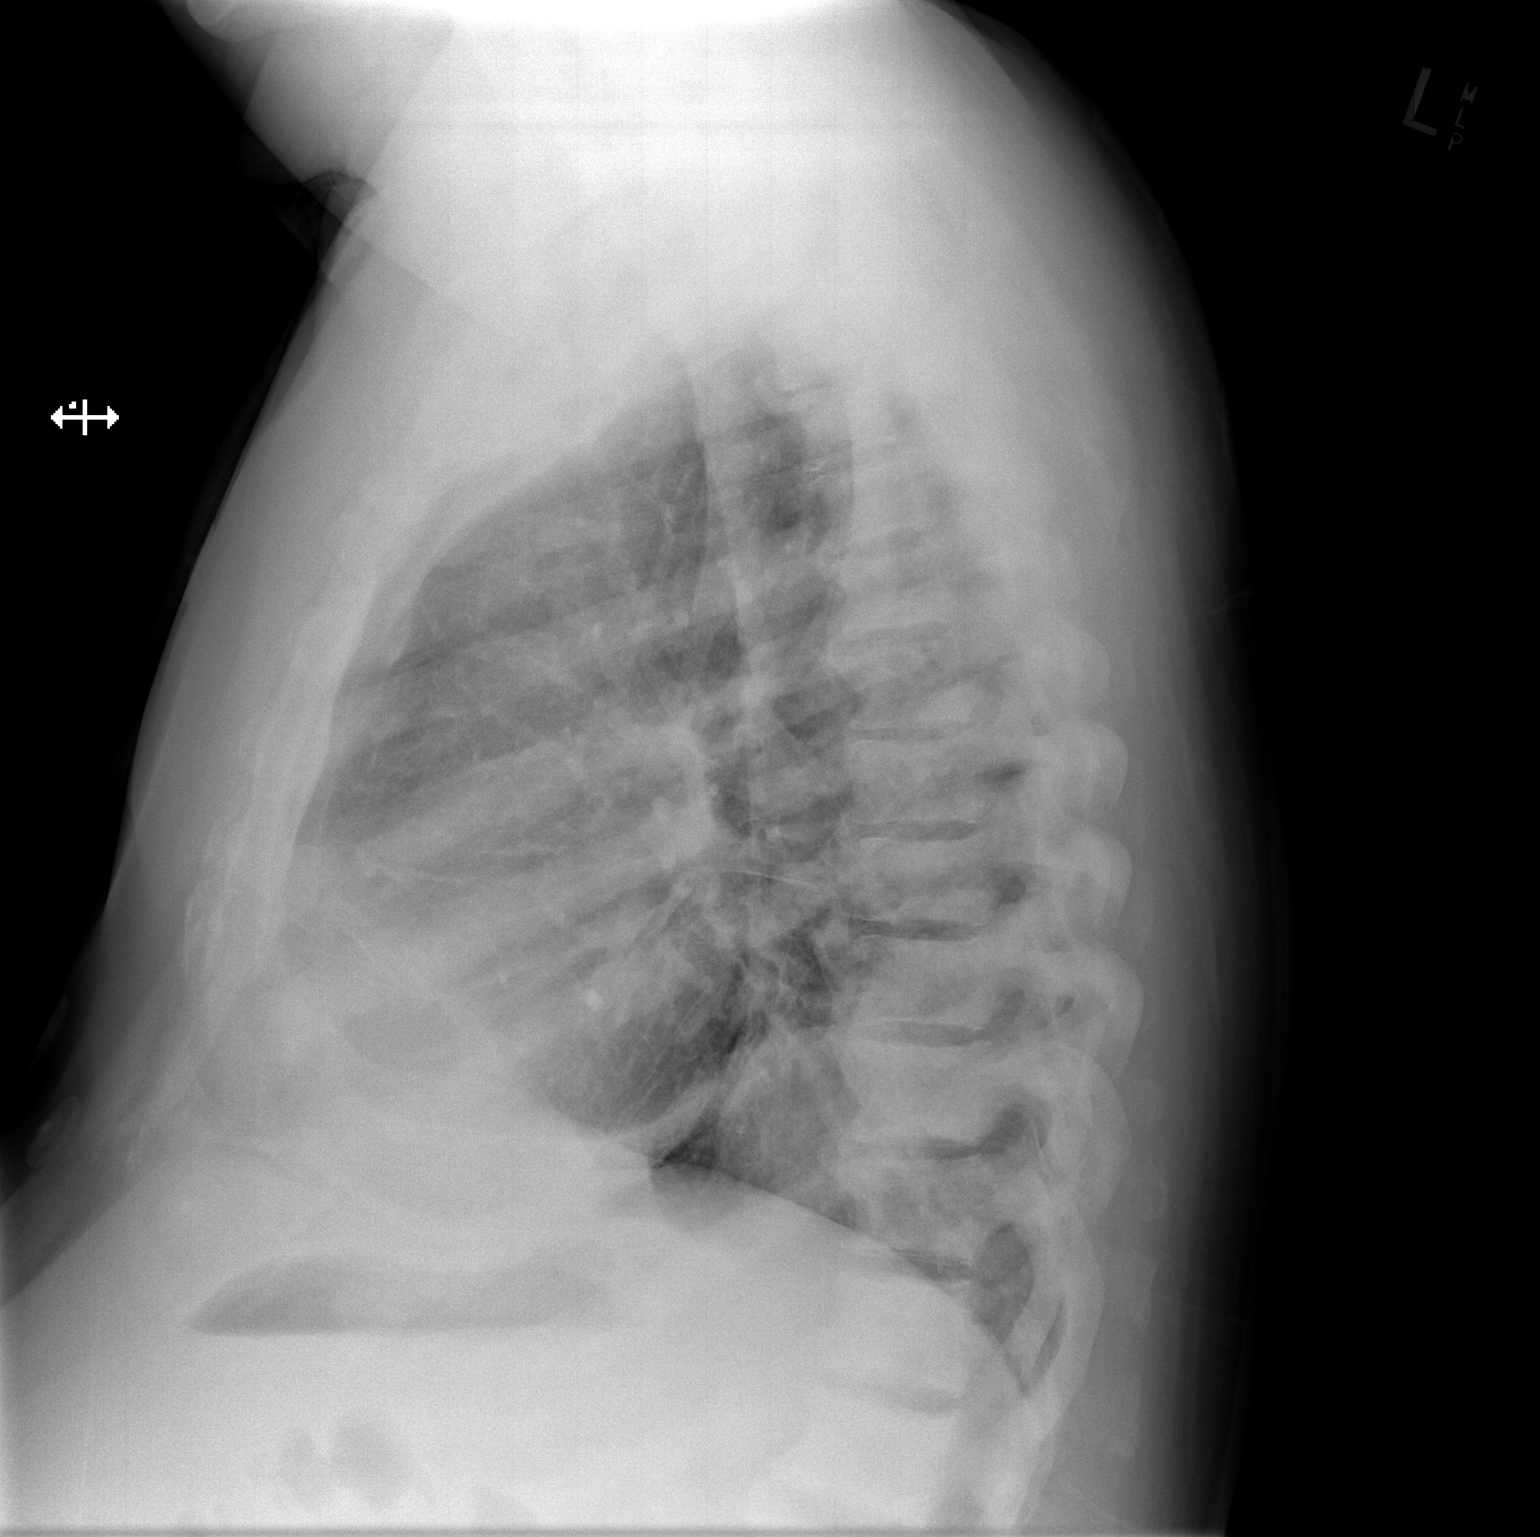

[2 of 2 positions shown; findings below may reference images not displayed]

FINDINGS: Stable cardiomediastinal contours. Mild increase in size of a small
to moderate right pleural effusion. Heterogeneous opacities in the
right lower lobe may reflect atelectasis, infiltrate not excluded.
There are new small nodules in the bilateral lungs some of which
appear calcified. No pneumothorax. Diffuse sclerotic bony lesions
consistent with known metastasis.
IMPRESSION: 1. Mild increase in size of a small to moderate right pleural
effusion.
2. Heterogeneous opacities in the right lower lobe may reflect
atelectasis, infiltrate not excluded.
3. New small nodules in the bilateral lungs, some of which appear
calcified, are indeterminate. Chest CT may be considered for further
evaluation.
4. Diffuse sclerotic bony lesions consistent with history of
metastasis.

## 2019-04-15 IMAGING — MR MR THORACIC SPINE W/O CM
5 of 6 series · 26 of 48 positions shown · non-contrast
Comparison: Cervical spine MRI [DATE]. CT abdomen/pelvis
[DATE]

CLINICAL DATA: Metastatic prostate cancer. Worsening back pain

EXAM:
MRI THORACIC SPINE WITHOUT CONTRAST
TECHNIQUE: Multiplanar, multisequence MR imaging of the thoracic spine was
performed. No intravenous contrast was administered.

[Series 2: sag (id) · sagittal · 3.0mm · 1.88mm/px · 4 of 12 slices shown]
[im 1/12]
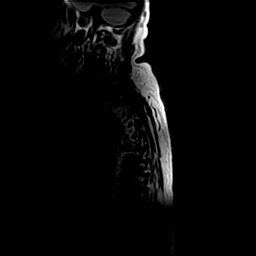
[im 4/12]
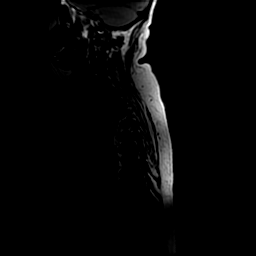
[im 8/12]
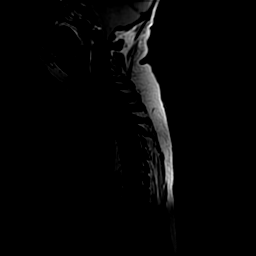
[im 12/12]
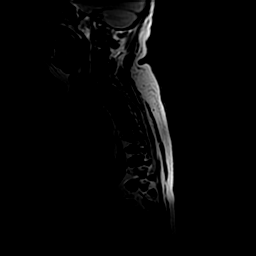

[Series 5: T1 · sagittal · 3.0mm · 1.37mm/px · 4 of 14 slices shown]
[im 1/14]
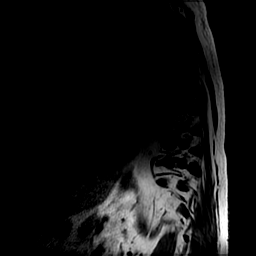
[im 5/14]
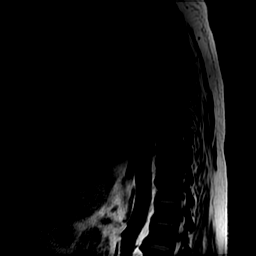
[im 9/14]
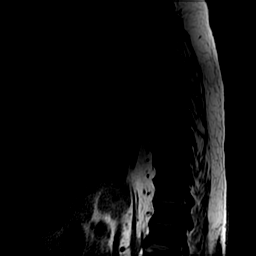
[im 14/14]
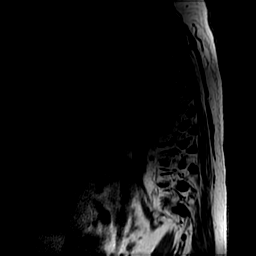

[Series 6: sag ir · sagittal · 3.0mm · 0.68mm/px · 4 of 14 slices shown]
[im 1/14]
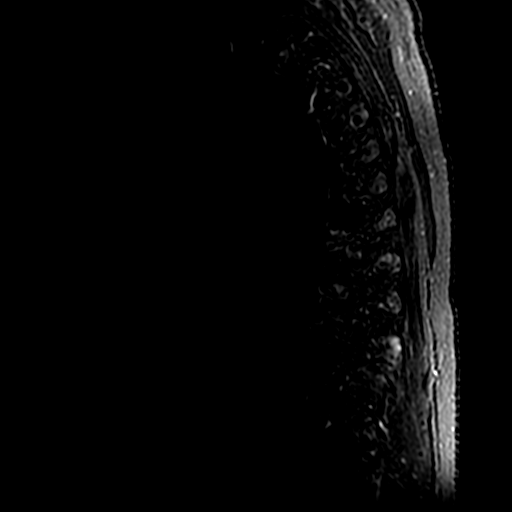
[im 5/14]
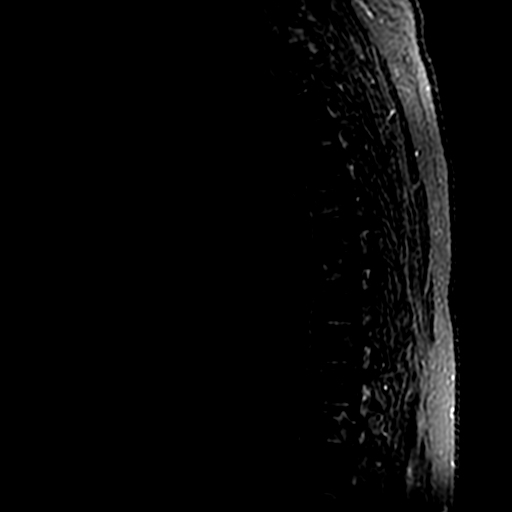
[im 9/14]
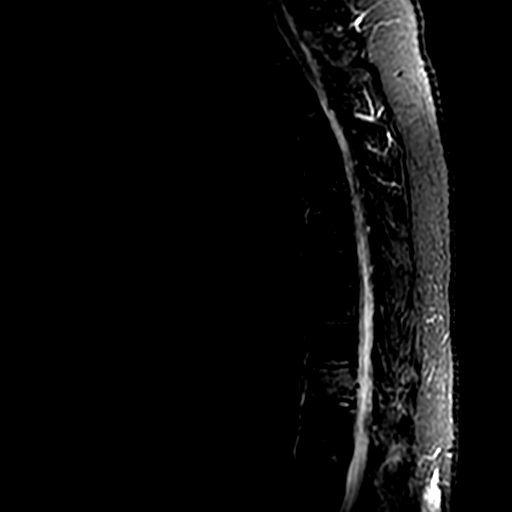
[im 14/14]
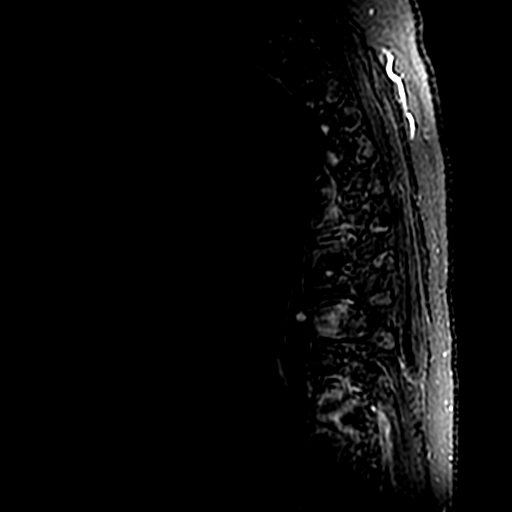

[Series 7: T2 post-contrast · sagittal · 3.0mm · 1.37mm/px · 4 of 14 slices shown]
[im 1/14]
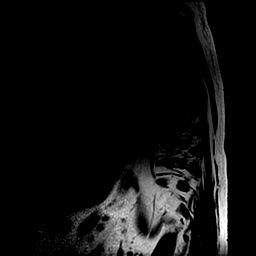
[im 5/14]
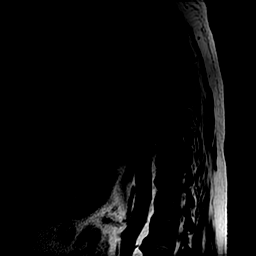
[im 9/14]
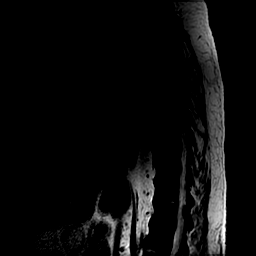
[im 14/14]
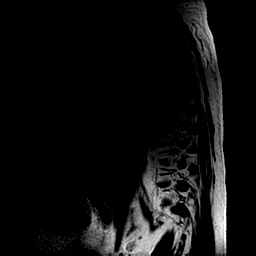

[Series 8: T2 · axial · 4.0mm · 0.43mm/px · z∈[-329,-71]mm · 10 of 58 slices shown]
[im 4/58]
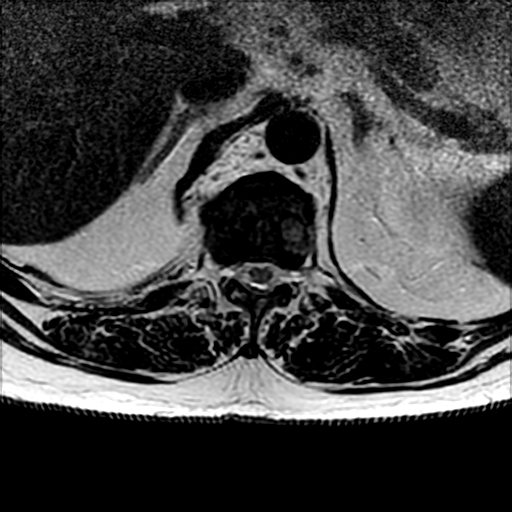
[im 8/58]
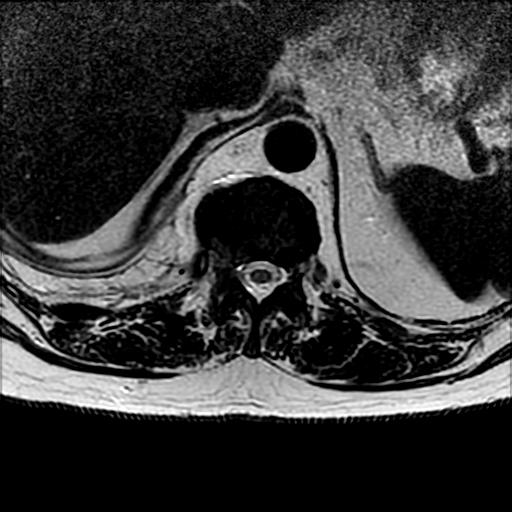
[im 12/58]
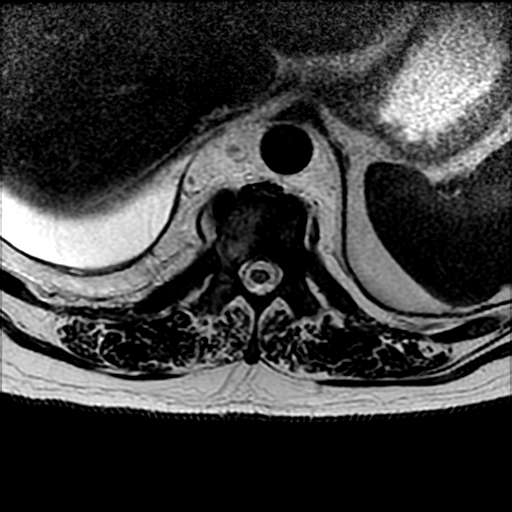
[im 20/58]
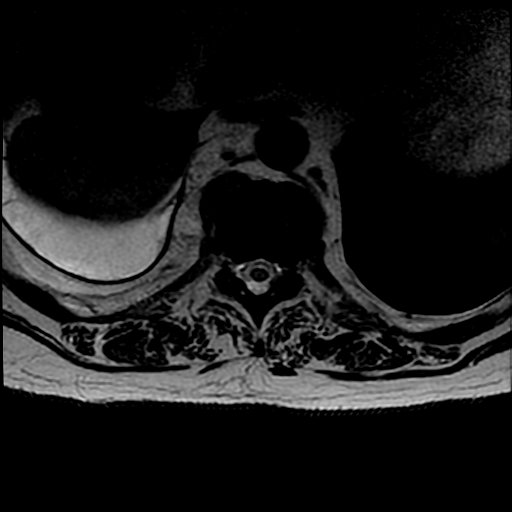
[im 27/58]
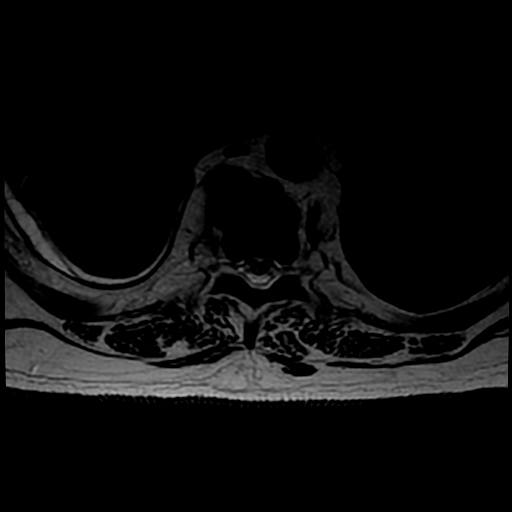
[im 31/58]
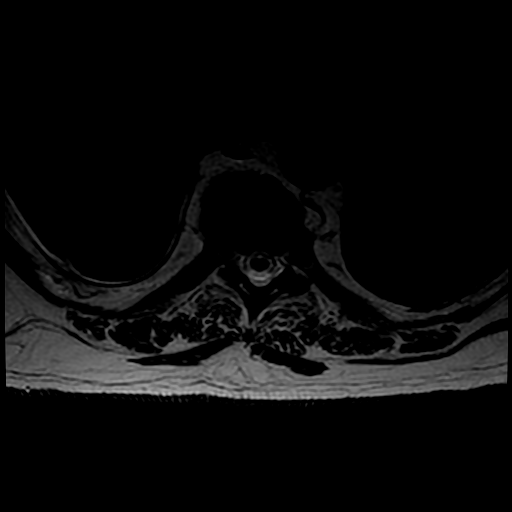
[im 35/58]
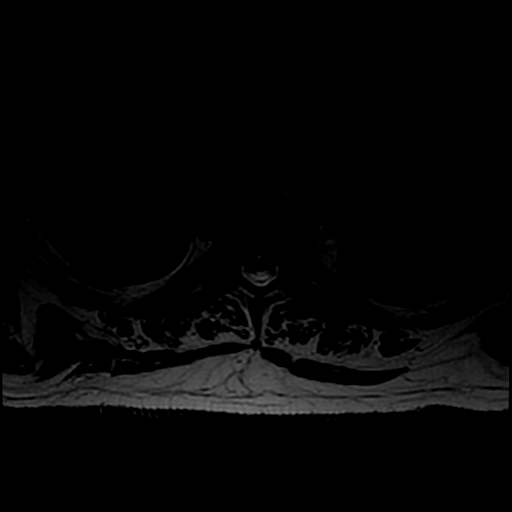
[im 42/58]
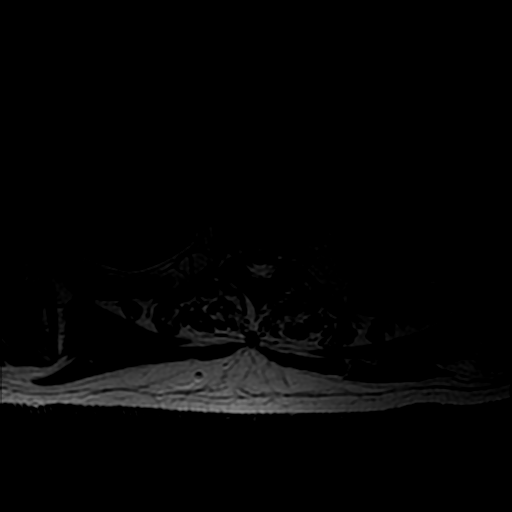
[im 50/58]
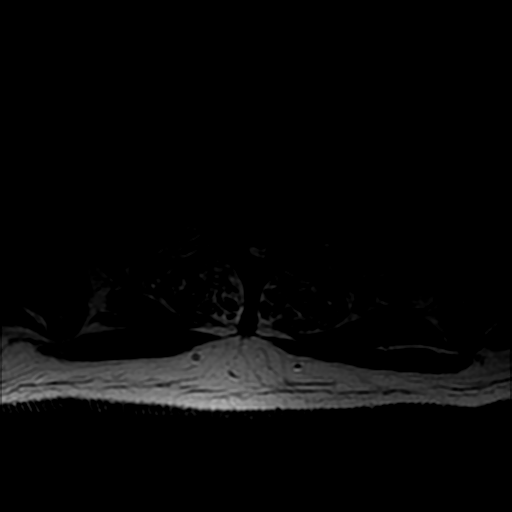
[im 58/58]
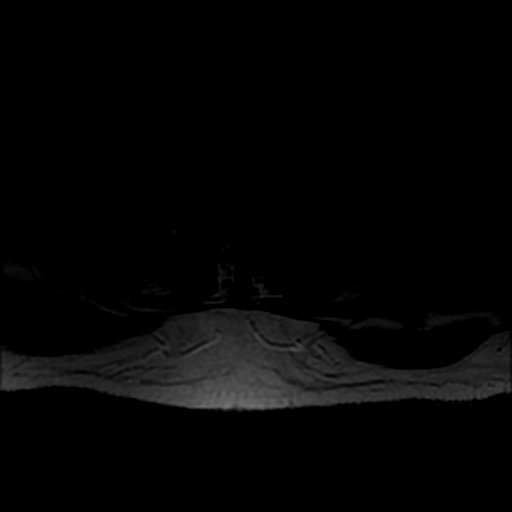

[26 of 48 positions shown; findings below may reference images not displayed]

FINDINGS: Alignment:  Physiologic.

Vertebrae: Widespread osseous metastatic disease throughout the
thoracic spine. The majority of the disease is low in signal on T1
and T2 weighted images suggesting largely sclerotic component. There
are a few areas of more heterogeneously hyperintense T2 signal
including within the T9 vertebral body and within the T11 vertebrae.

There is an acute pathologic fracture of the T11 vertebral body with
less than 25% height loss (series 6, image 5). Edema extends into
the right pedicle. Additional pathologic fracture of the T12
vertebral body (series 6, image 10) with mild marrow edema. No
significant vertebral body height loss. There is no bony
retropulsion at either level.

Expansile tumor within the posterior elements of T1 with associated
extra osseous soft tissue mass closely approximating the dorsal
epidural space (series 6, images 6-7; series 8, images 2-3). This
finding was seen to better advantage on small field-of-view cervical
spine images from same day MRI. No additional areas of epidural
tumor are seen within the remaining thoracic spine levels.

Cord:  Normal signal and morphology.

Paraspinal and other soft tissues: Small right and trace left
pleural effusions. Small amount of T2 hyperintense material within
the left mainstem bronchus, which could reflect mucosal
secretion/debris (series 8, image 21).

Disc levels:

There is no focal disc protrusion, canal stenosis, or foraminal
stenosis of the thoracic spine. Intervertebral disc heights are
preserved.
IMPRESSION: 1. Widespread osseous metastatic disease throughout the thoracic
spine.
2. Acute pathologic fracture of the T11 vertebral body with less
than 25% height loss.
3. Additional acute pathologic fracture of the T12 vertebral body
without significant height loss.
4. Expansile tumor within the posterior elements of T1 with
associated extra osseous soft tissue mass likely involving the
dorsal epidural space. This was seen to better advantage on small
field-of-view images from same day cervical spine MRI.
5. Small right and trace left pleural effusions.
6. Small amount of T2 hyperintense material within the left mainstem
bronchus, possibly mucosal secretion/debris.

These results were called by telephone at the time of interpretation
on [DATE] at [DATE] to provider GALLARDO, who verbally
acknowledged these results.

## 2019-04-15 IMAGING — MR MR CERVICAL SPINE W/O CM
5 series · 33 of 48 positions shown · IV contrast (Yes)
Comparison: No previous study available.

CLINICAL DATA: Metastatic prostate cancer. Worsening neck and back
pain.

EXAM:
MRI CERVICAL SPINE WITHOUT CONTRAST
TECHNIQUE: Multiplanar, multisequence MR imaging of the cervical spine was
performed. No intravenous contrast was administered.

[Series 2: T1 · sagittal · 3.0mm · 0.98mm/px · 6 of 12 slices shown]
[im 1/12]
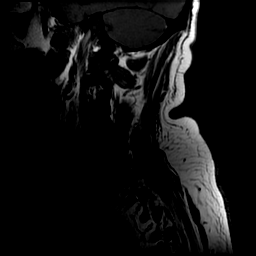
[im 3/12]
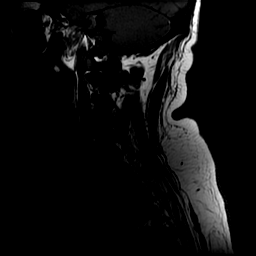
[im 5/12]
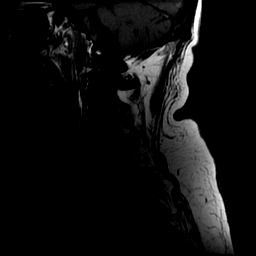
[im 7/12]
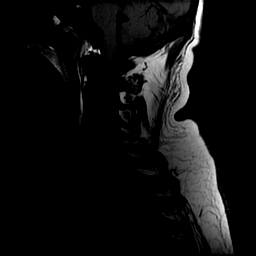
[im 9/12]
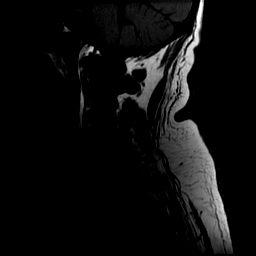
[im 12/12]
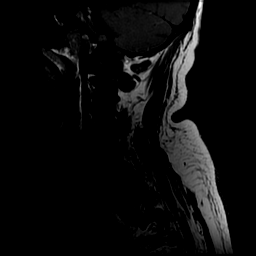

[Series 3: sag ir · sagittal · 3.0mm · 0.49mm/px · 5 of 12 slices shown]
[im 1/12]
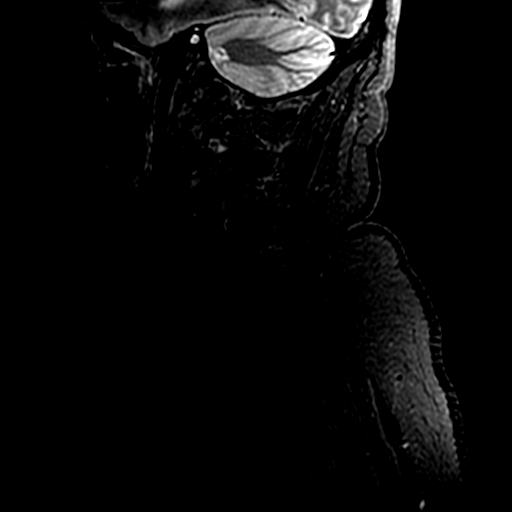
[im 3/12]
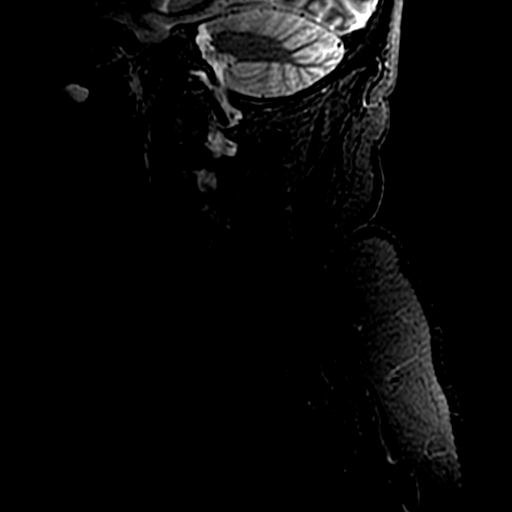
[im 6/12]
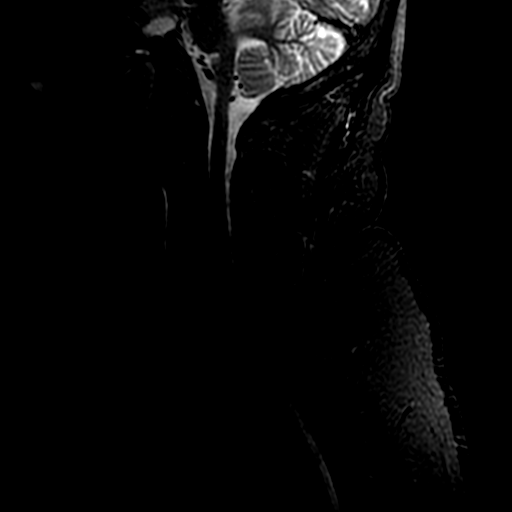
[im 9/12]
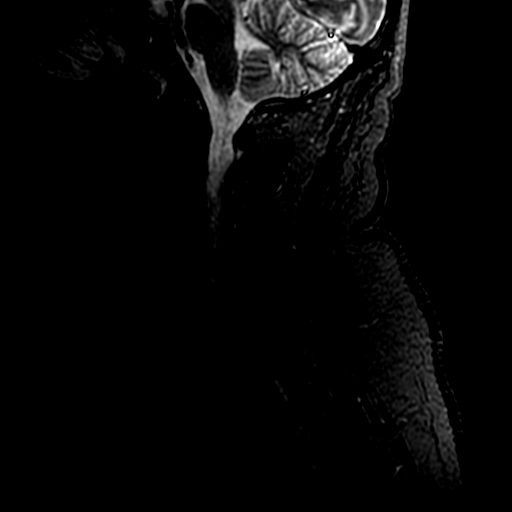
[im 12/12]
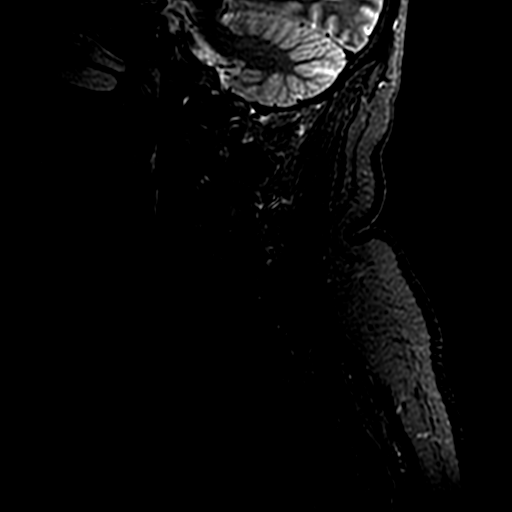

[Series 4: T2 post-contrast · sagittal · 3.0mm · 0.98mm/px · 5 of 12 slices shown]
[im 1/12]
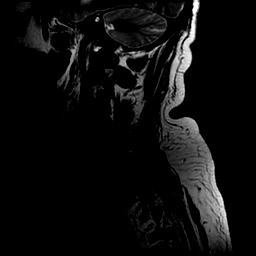
[im 3/12]
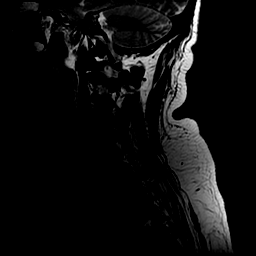
[im 6/12]
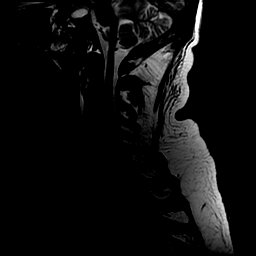
[im 9/12]
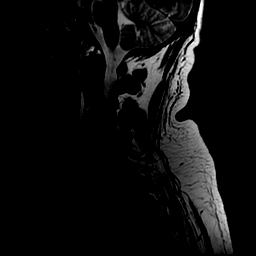
[im 12/12]
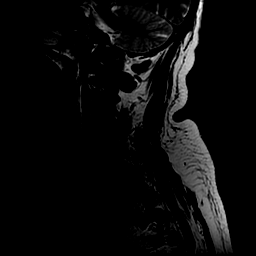

[Series 5: ax 2d merge · axial · 3.1mm · 0.39mm/px · z∈[-90,+13]mm · 8 of 30 slices shown]
[im 1/30]
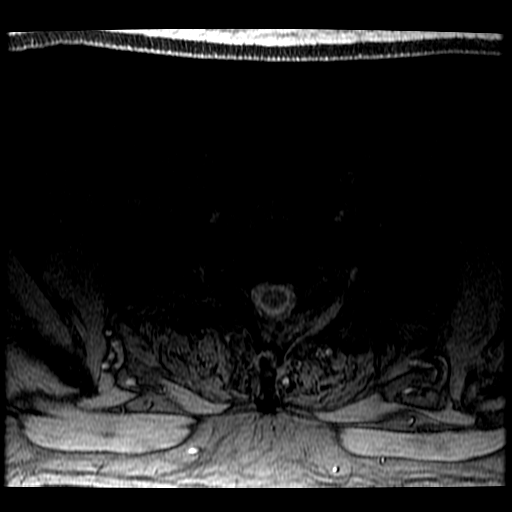
[im 5/30]
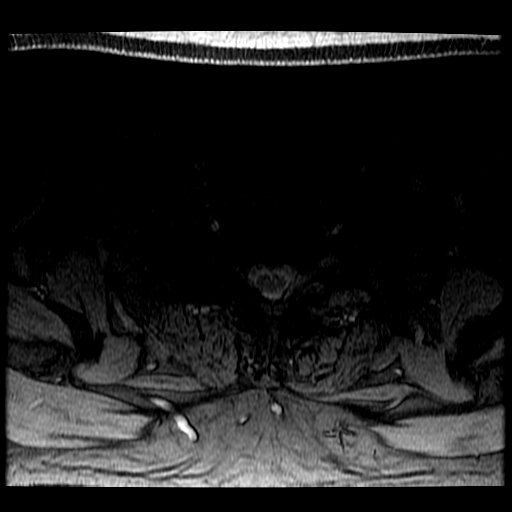
[im 10/30]
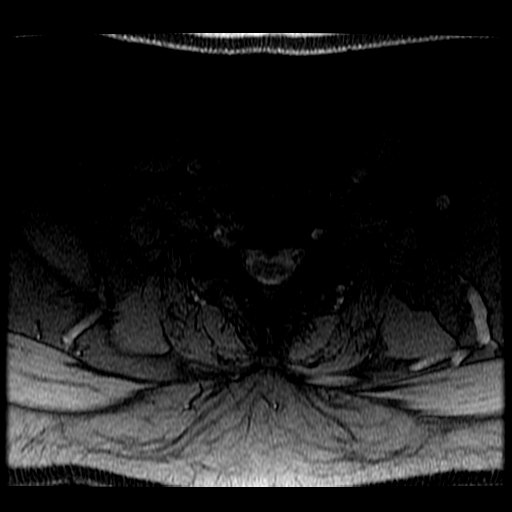
[im 13/30]
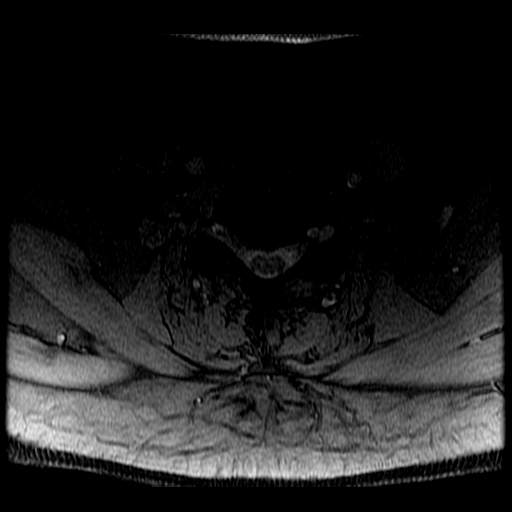
[im 17/30]
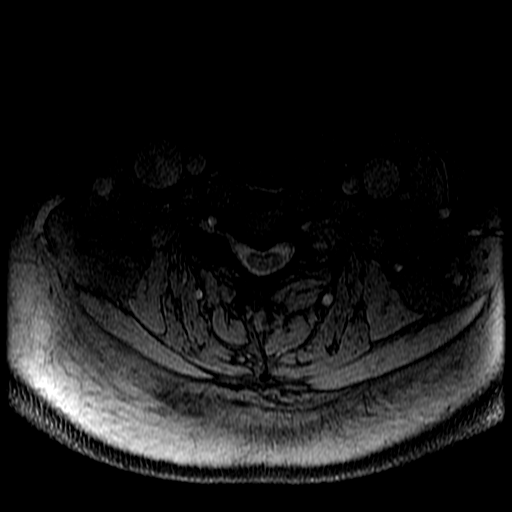
[im 20/30]
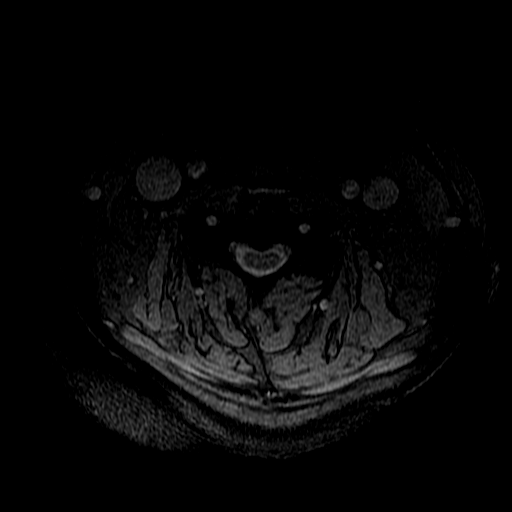
[im 25/30]
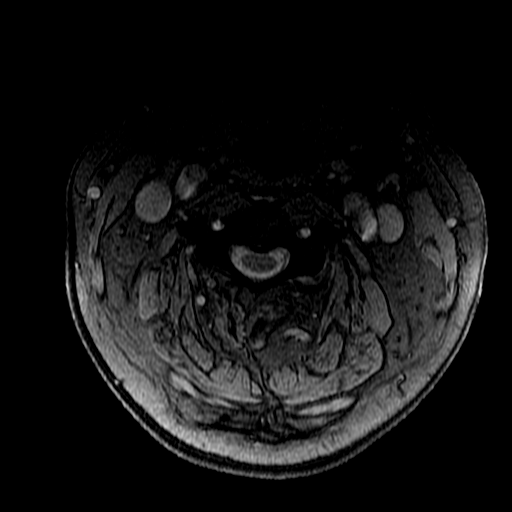
[im 30/30]
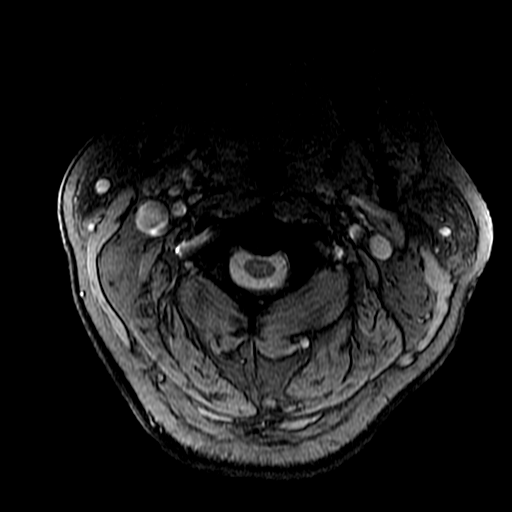

[Series 6: T2 · axial · 3.0mm · 0.78mm/px · z∈[-81,+44]mm · 9 of 42 slices shown]
[im 3/42]
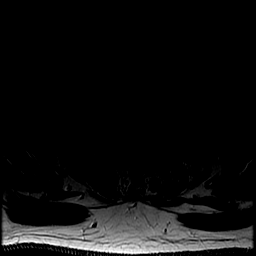
[im 7/42]
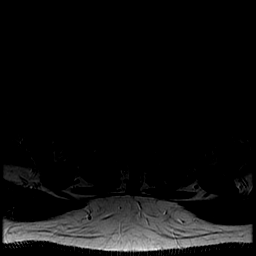
[im 12/42]
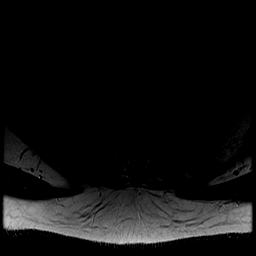
[im 19/42]
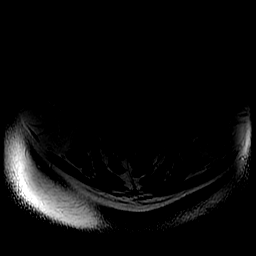
[im 21/42]
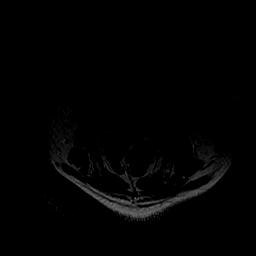
[im 23/42]
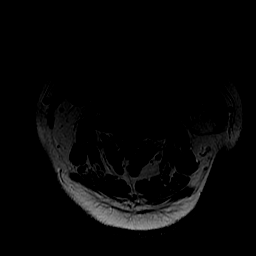
[im 30/42]
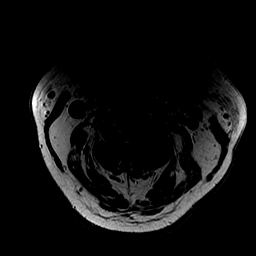
[im 35/42]
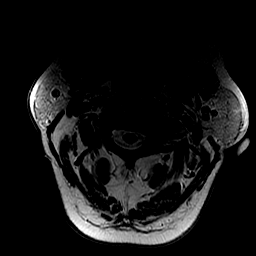
[im 39/42]
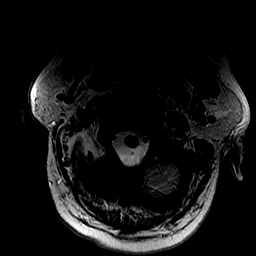

[33 of 48 positions shown; findings below may reference images not displayed]

FINDINGS: Alignment: Normal

Vertebrae: Widespread sclerotic change throughout the bones
consistent with widespread metastatic prostate carcinoma. There is
no evidence of pathologic fracture. Most of the metastatic disease
appears to be sclerotic and intra osseous. More cellular type
disease is seen affecting the C3 vertebral body but without evidence
of extraosseous tumor. More cellular disease is present affecting
the posterior elements T1, with some adjacent extraosseous soft
tissue tumor including some dorsal epidural disease. No sign of cord
compression however. There may be foraminal encroachment on the left
at this level that could affect the C8 nerve.

Cord: No cord compression or evidence of metastatic disease
affecting the cord directly.

Posterior Fossa, vertebral arteries, paraspinal tissues: Negative

Disc levels:

No significant disc pathology. See above regarding osseous
metastatic disease.
IMPRESSION: Osseous metastatic disease throughout the region, primarily
sclerotic. Expansile disease of the posterior elements on the left
at T1, with a small amount of extraosseous tumor including dorsal
epidural tumor, but without cord compression. There may be foraminal
encroachment on the left at the C7-T1 level that could affect the
left C8 nerve.

## 2019-04-15 MED ORDER — HYDROMORPHONE HCL 1 MG/ML IJ SOLN
1.0000 mg | Freq: Once | INTRAMUSCULAR | Status: AC
  Administered 2019-04-15: 1 mg via INTRAVENOUS
  Filled 2019-04-15: qty 1

## 2019-04-15 NOTE — ED Provider Notes (Signed)
[facility] DEPT Provider Note   CSN: [id number] Arrival date & time: 04/15/19  1351     History Chief Complaint  Patient presents with  . Left Arm Numbness    Jeffery [name] is a 58 y.o. male.  Presents to ER with chief complaint left arm numbness.  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  Patient states he had for the past week left arm tingling and numbness.  Initially was intermittent, seem to come and go at random, over the past day or 2 though seems to be more constant, throughout the day has been constant.  Describes as tingling sensation throughout his entire arm.  Also has some mild lower neck pain and upper back pain.  States this is also been worsening over the past few days.  He continues to have chronic low back pain but this has not changed recently.  No numbness, tingling in his legs, no bladder or bowel incontinence.  He has no fever, no cough, no difficulty in breathing.  HPI     Past Medical History:  Diagnosis Date  . Cancer ([place])    stage IV prostate cancer per patient  . Diabetes mellitus, new onset ([place]) 07/2016  . Enlarged prostate   . HLD (hyperlipidemia)     Patient Active Problem List   Diagnosis Date Noted  . Acute renal failure superimposed on stage 3 chronic kidney disease ([place]) 10/13/2018  . Vitamin B12 deficiency 10/13/2018  . Prostate cancer metastatic to multiple sites ([place]) 10/13/2018  . Acute cystitis with hematuria   . Acute hyperkalemia   . DM2 (diabetes mellitus, type 2) ([place]) 10/12/2018  . Dysuria 10/12/2018  . CKD (chronic kidney disease) stage 3, GFR 30-59 ml/min 10/12/2018  . Hyperkalemia 10/12/2018  . Metabolic acidosis, normal anion gap (NAG) 10/12/2018  . Sinus tachycardia 07/31/2016  . Diabetes mellitus, new onset ([place])   . Hyperglycemia 07/29/2016  . Urinary retention 07/29/2016  . AKI (acute kidney injury) ([place]) 07/29/2016  . Cholelithiasis  07/29/2016  . Fatty liver 07/29/2016  . Hydronephrosis 07/29/2016  . Hypertension 07/29/2016    Past Surgical History:  Procedure Laterality Date  . I & D EXTREMITY Left 01/27/2019   Procedure: IRRIGATION AND DEBRIDEMENT EXTREMITY;  Surgeon: Iran Planas, MD;  Location: [facility];  Service: Orthopedics;  Laterality: Left;  . KNEE ARTHROSCOPY Right   . ORCHIECTOMY Bilateral 10/12/2018   Procedure: ORCHIECTOMY;  Surgeon: Lucas Mallow, MD;  Location: [facility];  Service: Urology;  Laterality: Bilateral;  . TRANSURETHRAL RESECTION OF PROSTATE  10/12/2018   Procedure: CYSTOSCOPY WITH URETHRAL DILATION;  Surgeon: Lucas Mallow, MD;  Location: [facility];  Service: Urology;;       Family History  Problem Relation Age of Onset  . Prostate cancer Father   . Heart disease Father   . Prostate cancer Paternal Grandfather     Social History   Tobacco Use  . Smoking status: Former Smoker    Packs/day: 1.50    Years: 42.00    Pack years: 63.00    Types: Cigarettes    Quit date: 2015    Years since quitting: 6.0  . Smokeless tobacco: Never Used  . Tobacco comment: 2015  Substance Use Topics  . Alcohol use: No  . Drug use: No    Home Medications Prior to Admission medications   Medication Sig Start Date End Date Taking? Authorizing Provider  feeding supplement, ENSURE ENLIVE, (ENSURE  ENLIVE) LIQD Take 237 mLs by mouth daily. 10/14/18  Yes Hongalgi, Lenis Dickinson, MD  finasteride (PROSCAR) 5 MG tablet Take 5 mg by mouth daily. 02/12/18  Yes [provider]  furosemide (LASIX) 40 MG tablet Take 40 mg by mouth daily.   Yes [provider]  gabapentin (NEURONTIN) 300 MG capsule Take 1 capsule (300 mg total) by mouth 3 (three) times daily. Patient taking differently: Take 300-600 mg by mouth See admin instructions. 343m in am and 6018min evening 01/12/19  Yes Shadad, FiMathis DadMD  HYDROcodone-acetaminophen (NORCO/VICODIN) 5-325 MG tablet TAKE 1 - 2 TABLETS BY MOUTH EVERY 4 - 6  HOURS AS NEEDED FOR PAIN **MAX OF 8 TABLETS IN 24 HOURS** Patient taking differently: Take 1-2 tablets by mouth See admin instructions. Takes 1 tablet in am and 2 tablets in evening 04/12/19  Yes Shadad, FiMathis DadMD  metFORMIN (GLUCOPHAGE-XR) 500 MG 24 hr tablet Take 500 mg by mouth 2 (two) times daily.  01/12/19  Yes [provider]  Multiple Vitamin (MULTIVITAMIN WITH MINERALS) TABS tablet Take 1 tablet by mouth daily. 10/15/18  Yes Hongalgi, AnLenis DickinsonMD  predniSONE (DELTASONE) 5 MG tablet Take 1 tablet (5 mg total) by mouth daily with breakfast. 11/03/18  Yes Shadad, FiMathis DadMD  tamsulosin (FLOMAX) 0.4 MG CAPS capsule Take 2 capsules (0.8 mg total) by mouth daily after supper. 10/14/18  Yes Hongalgi, AnLenis DickinsonMD  vitamin B-12 (CYANOCOBALAMIN) 1000 MCG tablet Take 1 tablet (1,000 mcg total) by mouth daily. 10/14/18  Yes Hongalgi, AnLenis DickinsonMD  ZYTIGA 250 MG tablet TAKE 4 TABLETS BY MOUTH ONCE DAILY AS DIRECTED.  TAKE 1 HOUR BEFORE OR 2 HOURS AFTER A MEAL Patient taking differently: Take 1,000 mg by mouth daily.  12/18/18  Yes Shadad, FiMathis DadMD  Amino Acids-Protein Hydrolys (FEEDING SUPPLEMENT, PRO-STAT SUGAR FREE 64,) LIQD Take 30 mLs by mouth 3 (three) times daily. Patient not taking: Reported on 03/31/2019 10/14/18   HoModena JanskyMD  Blood Glucose Monitoring Suppl (RELION CONFIRM GLUCOSE MONITOR) w/Device KIT Use 4 times daily before meals and bedtime as directed. Patient not taking: Reported on 03/31/2019 07/31/16   StHolley RaringMD  glucose blood (RELION GLUCOSE TEST STRIPS) test strip Use as instructed Patient not taking: Reported on 03/31/2019 07/31/16   StHolley RaringMD  Lancets 30G MISC Use 4 times daily before meals as directed. Patient not taking: Reported on 03/31/2019 07/31/16   StHolley RaringMD    Allergies    Patient has no known allergies.  Review of Systems   Review of Systems  Constitutional: Negative for chills and fever.  HENT: Negative for ear pain and sore  throat.   Eyes: Negative for pain and visual disturbance.  Respiratory: Negative for cough and shortness of breath.   Cardiovascular: Negative for chest pain and palpitations.  Gastrointestinal: Negative for abdominal pain and vomiting.  Genitourinary: Negative for dysuria and hematuria.  Musculoskeletal: Negative for arthralgias and back pain.  Skin: Negative for color change and rash.  Neurological: Positive for numbness. Negative for seizures and syncope.  All other systems reviewed and are negative.   Physical Exam Updated Vital Signs BP 127/87 (BP Location: Right Arm)   Pulse (!) 115   Temp 98.4 F (36.9 C) (Oral)   Resp 16   Ht 5' 9"  (1.753 m)   Wt 117.9 kg   SpO2 98%   BMI 38.40 kg/m   Physical Exam Vitals and nursing note reviewed.  Constitutional:      Appearance: He is well-developed.  HENT:     Head: Normocephalic and atraumatic.  Eyes:     Conjunctiva/sclera: Conjunctivae normal.  Cardiovascular:     Rate and Rhythm: Normal rate and regular rhythm.     Heart sounds: No murmur.  Pulmonary:     Effort: Pulmonary effort is normal. No respiratory distress.     Breath sounds: Normal breath sounds.  Abdominal:     Palpations: Abdomen is soft.     Tenderness: There is no abdominal tenderness.  Musculoskeletal:        General: No swelling or tenderness.     Cervical back: Neck supple.  Skin:    General: Skin is warm and dry.     Capillary Refill: Capillary refill takes less than 2 seconds.  Neurological:     General: No focal deficit present.     Mental Status: He is alert and oriented to person, place, and time.     Comments: UE: Sensation to light touch and pin prick intact in upper extremities, 5/5 strength throughout LE: sensation to light touch intact throughout, 5/5 strength throughout     ED Results / Procedures / Treatments   Labs (all labs ordered are listed, but only abnormal results are displayed) Labs Reviewed  CBC WITH DIFFERENTIAL/PLATELET  - Abnormal; Notable for the following components:      Result Value   RBC 3.53 (*)    Hemoglobin 9.0 (*)    HCT 31.0 (*)    MCH 25.5 (*)    MCHC 29.0 (*)    RDW 17.8 (*)    Abs Immature Granulocytes 0.10 (*)    All other components within normal limits  BASIC METABOLIC PANEL - Abnormal; Notable for the following components:   Glucose, Bld 122 (*)    BUN 33 (*)    Creatinine, Ser 1.80 (*)    Calcium 8.8 (*)    GFR calc non Af Amer 41 (*)    GFR calc Af Amer 48 (*)    All other components within normal limits    EKG None  Radiology DG Chest 2 View  Result Date: 04/15/2019 CLINICAL DATA:  Pt states he has Ca with Mets, Had a MRI which show massive spinal involvement that he feels is cause of pain. Copy with him. He had pneumonia last month, states Rt chest feels cold. Smoked for 40 years. Hx of HTN and DM. EXAM: CHEST - 2 VIEW COMPARISON:  Chest radiograph 01/27/2019 FINDINGS: Stable cardiomediastinal contours. Mild increase in size of a small to moderate right pleural effusion. Heterogeneous opacities in the right lower lobe may reflect atelectasis, infiltrate not excluded. There are new small nodules in the bilateral lungs some of which appear calcified. No pneumothorax. Diffuse sclerotic bony lesions consistent with known metastasis. IMPRESSION: 1. Mild increase in size of a small to moderate right pleural effusion. 2. Heterogeneous opacities in the right lower lobe may reflect atelectasis, infiltrate not excluded. 3. New small nodules in the bilateral lungs, some of which appear calcified, are indeterminate. Chest CT may be considered for further evaluation. 4. Diffuse sclerotic bony lesions consistent with history of metastasis. Electronically Signed   By: Audie Pinto M.D.   On: 04/15/2019 14:52   MR Cervical Spine Wo Contrast  Result Date: 04/15/2019 CLINICAL DATA:  Metastatic prostate cancer. Worsening neck and back pain. EXAM: MRI CERVICAL SPINE WITHOUT CONTRAST TECHNIQUE:  Multiplanar, multisequence MR imaging of the cervical spine was performed. No intravenous  contrast was administered. COMPARISON:  No previous study available. FINDINGS: Alignment: Normal Vertebrae: Widespread sclerotic change throughout the bones consistent with widespread metastatic prostate carcinoma. There is no evidence of pathologic fracture. Most of the metastatic disease appears to be sclerotic and intra osseous. More cellular type disease is seen affecting the C3 vertebral body but without evidence of extraosseous tumor. More cellular disease is present affecting the posterior elements T1, with some adjacent extraosseous soft tissue tumor including some dorsal epidural disease. No sign of cord compression however. There may be foraminal encroachment on the left at this level that could affect the C8 nerve. Cord: No cord compression or evidence of metastatic disease affecting the cord directly. Posterior Fossa, vertebral arteries, paraspinal tissues: Negative Disc levels: No significant disc pathology. See above regarding osseous metastatic disease. IMPRESSION: Osseous metastatic disease throughout the region, primarily sclerotic. Expansile disease of the posterior elements on the left at T1, with a small amount of extraosseous tumor including dorsal epidural tumor, but without cord compression. There may be foraminal encroachment on the left at the C7-T1 level that could affect the left C8 nerve. Electronically Signed   By: Nelson Chimes M.D.   On: 04/15/2019 17:52   MR THORACIC SPINE WO CONTRAST  Result Date: 04/15/2019 CLINICAL DATA:  Metastatic prostate cancer. Worsening back pain EXAM: MRI THORACIC SPINE WITHOUT CONTRAST TECHNIQUE: Multiplanar, multisequence MR imaging of the thoracic spine was performed. No intravenous contrast was administered. COMPARISON:  Cervical spine MRI 04/15/2019. CT abdomen/pelvis 12/23/2018 FINDINGS: Alignment:  Physiologic. Vertebrae: Widespread osseous metastatic disease  throughout the thoracic spine. The majority of the disease is low in signal on T1 and T2 weighted images suggesting largely sclerotic component. There are a few areas of more heterogeneously hyperintense T2 signal including within the T9 vertebral body and within the T11 vertebrae. There is an acute pathologic fracture of the T11 vertebral body with less than 25% height loss (series 6, image 5). Edema extends into the right pedicle. Additional pathologic fracture of the T12 vertebral body (series 6, image 10) with mild marrow edema. No significant vertebral body height loss. There is no bony retropulsion at either level. Expansile tumor within the posterior elements of T1 with associated extra osseous soft tissue mass closely approximating the dorsal epidural space (series 6, images 6-7; series 8, images 2-3). This finding was seen to better advantage on small field-of-view cervical spine images from same day MRI. No additional areas of epidural tumor are seen within the remaining thoracic spine levels. Cord:  Normal signal and morphology. Paraspinal and other soft tissues: Small right and trace left pleural effusions. Small amount of T2 hyperintense material within the left mainstem bronchus, which could reflect mucosal secretion/debris (series 8, image 21). Disc levels: There is no focal disc protrusion, canal stenosis, or foraminal stenosis of the thoracic spine. Intervertebral disc heights are preserved. IMPRESSION: 1. Widespread osseous metastatic disease throughout the thoracic spine. 2. Acute pathologic fracture of the T11 vertebral body with less than 25% height loss. 3. Additional acute pathologic fracture of the T12 vertebral body without significant height loss. 4. Expansile tumor within the posterior elements of T1 with associated extra osseous soft tissue mass likely involving the dorsal epidural space. This was seen to better advantage on small field-of-view images from same day cervical spine MRI. 5.  Small right and trace left pleural effusions. 6. Small amount of T2 hyperintense material within the left mainstem bronchus, possibly mucosal secretion/debris. These results were called by telephone at the time of  interpretation on 04/15/2019 at 6:09 pm to provider Tyrone Nine, who verbally acknowledged these results. Electronically Signed   By: Davina Poke D.O.   On: 04/15/2019 18:13    Procedures Procedures (including critical care time)  Medications Ordered in ED Medications  HYDROmorphone (DILAUDID) injection 1 mg (1 mg Intravenous Given 04/15/19 1625)  HYDROmorphone (DILAUDID) injection 1 mg (1 mg Intravenous Given 04/15/19 1922)    ED Course  I have reviewed the triage vital signs and the nursing notes.  Pertinent labs & imaging results that were available during my care of the patient were reviewed by me and considered in my medical decision making (see chart for details).  Clinical Course as of Apr 15 13  Thu Apr 15, 2019  1602 Complete assessment, discussed case with his radiation oncologist, recommends MR C and T-spine to further evaluate   [RD]    Clinical Course User Index [RD] Lucrezia Starch, MD   MDM Rules/Calculators/A&P                      58 year old male with history of metastatic prostate cancer presents to ER with new onset left arm tingling.  No focal deficits on exam, but given history of metastatic cancer, concern for possible cord compression related to mets.  Discussed with his radiation oncologist.  Obtained MRI C and T-spine.  These were concerning for fairly extensive metastatic disease, noted pathologic fracture in lower T-spine; more related to patient's left arm tingling, noted dorsal epidural tumor, possible foraminal encroachment at the level of C8 nerve, however there is no cord compression.  Patient has no focal deficits, no focal weakness on exam.  Discussed MRI findings with Dr. Eula Listen, so long as patient has no radiographic or clinical evidence  of cord compression, patient can be discharged home.  He plan to see patient on Monday to further discuss tx options. Patient's pain well controlled, will dc home.    After the discussed management above, the patient was determined to be safe for discharge.  The patient was in agreement with this plan and all questions regarding their care were answered.  ED return precautions were discussed and the patient will return to the ED with any significant worsening of condition.   Final Clinical Impression(s) / ED Diagnoses Final diagnoses:  Prostate cancer metastatic to multiple sites [facility])    Rx / DC Orders ED Discharge Orders    None       Lucrezia Starch, MD 04/16/19 [phone]

## 2019-04-15 NOTE — Progress Notes (Signed)
I reviewed the patient's recent MRI findings showing some disease in the spinal canal with no cord compression. The patient will be asked to present to the clinic Monday for evaluation and treatment. If he develops signs or symptoms of cord compression prior to that, he will be advised to present to the clinic or the emergency department on emergency basis.

## 2019-04-15 NOTE — ED Triage Notes (Signed)
Pt states he has Ca with Mets, Had a MRI which show massive spinal involvement that he feels is cause of pain. Copy with him. He had pneumonia last month, states Rt chest feels cold.

## 2019-04-15 NOTE — Telephone Encounter (Addendum)
Received voicemail message from patient requesting a return call. Believed to be anxiety noted in his voice. Phoned patient back promptly. Patient reports progressive right arm weakness x3 days. Patient reports today his arm is "complete numb."  Patient known to have stage 4 prostate ca with bone mets. Patient planning to return in January for palliative radiation to his spine. Patient completed radiation therapy in October 2020 to his right hip and right scapula. In addition patient reports left hip pain. Instructed patient to present to [facility] emergency asap for evaluation. Concerned for cord compression. Patient verbalized understanding. Informed Ashlyn Bruning, PA-C of these findings. She is in agreement with plan.

## 2019-04-15 NOTE — Discharge Instructions (Addendum)
Return to ER if you develop worsening numbness, weakness in your arm, any bladder or bowel incontinence, fevers, difficulty breathing or other new concerning symptom.  Please follow-up with your radiation oncologist on Monday as discussed.  Additionally recommend follow-up with your primary doctor next week for recheck regarding your pain management regimen.

## 2019-04-18 ENCOUNTER — Encounter: Payer: Self-pay | Admitting: Radiation Oncology

## 2019-04-18 NOTE — Progress Notes (Signed)
Radiation Oncology         [phone] ________________________________  Name: Jeffery [name] MRN: [id number]  Date: 04/18/2019  DOB: [birth year]  Chart Note:  I called the patient today to follow-up on his recent emergency department visit and develop a plan.  He reports that his lumbar back pain continues intermittently.  He also reports some left hip pain.  He also continues to suffer with left arm numbness.  He denies any lower extremity weakness to suggest spinal cord compression.  At this time, our plan will be for the patient to present to the clinic on Tuesday, January 5 for reevaluation.  During that visit, we will set up simulation for his T1 involvement as well as his lumbar spine and left hip.  Patient was agreeable to this plan.  He understands if he develops any symptoms of spinal cord compression, to present to the emergency department immediately.    Lab Findings: Lab Results  Component Value Date   WBC 7.3 04/15/2019   HGB 9.0 (L) 04/15/2019   HCT 31.0 (L) 04/15/2019   MCV 87.8 04/15/2019   PLT 240 04/15/2019      Chemistry      Component Value Date/Time   NA 140 04/15/2019 1623   K 4.1 04/15/2019 1623   CL 103 04/15/2019 1623   CO2 26 04/15/2019 1623   BUN 33 (H) 04/15/2019 1623   CREATININE 1.80 (H) 04/15/2019 1623   CREATININE 1.94 (H) 03/05/2019 0755      Component Value Date/Time   CALCIUM 8.8 (L) 04/15/2019 1623   ALKPHOS 315 (H) 03/05/2019 0755   AST 15 03/05/2019 0755   ALT 14 03/05/2019 0755   BILITOT 0.3 03/05/2019 0755       Radiographic Findings: DG Chest 2 View  Result Date: 04/15/2019 CLINICAL DATA:  Pt states he has Ca with Mets, Had a MRI which show massive spinal involvement that he feels is cause of pain. Copy with him. He had pneumonia last month, states Rt chest feels cold. Smoked for 40 years. Hx of HTN and DM. EXAM: CHEST - 2 VIEW COMPARISON:  Chest radiograph 01/27/2019 FINDINGS: Stable cardiomediastinal contours. Mild  increase in size of a small to moderate right pleural effusion. Heterogeneous opacities in the right lower lobe may reflect atelectasis, infiltrate not excluded. There are new small nodules in the bilateral lungs some of which appear calcified. No pneumothorax. Diffuse sclerotic bony lesions consistent with known metastasis. IMPRESSION: 1. Mild increase in size of a small to moderate right pleural effusion. 2. Heterogeneous opacities in the right lower lobe may reflect atelectasis, infiltrate not excluded. 3. New small nodules in the bilateral lungs, some of which appear calcified, are indeterminate. Chest CT may be considered for further evaluation. 4. Diffuse sclerotic bony lesions consistent with history of metastasis. Electronically Signed   By: Audie Pinto M.D.   On: 04/15/2019 14:52   MR Cervical Spine Wo Contrast  Result Date: 04/15/2019 CLINICAL DATA:  Metastatic prostate cancer. Worsening neck and back pain. EXAM: MRI CERVICAL SPINE WITHOUT CONTRAST TECHNIQUE: Multiplanar, multisequence MR imaging of the cervical spine was performed. No intravenous contrast was administered. COMPARISON:  No previous study available. FINDINGS: Alignment: Normal Vertebrae: Widespread sclerotic change throughout the bones consistent with widespread metastatic prostate carcinoma. There is no evidence of pathologic fracture. Most of the metastatic disease appears to be sclerotic and intra osseous. More cellular type disease is seen affecting the C3 vertebral body but without evidence of extraosseous tumor. More cellular  disease is present affecting the posterior elements T1, with some adjacent extraosseous soft tissue tumor including some dorsal epidural disease. No sign of cord compression however. There may be foraminal encroachment on the left at this level that could affect the C8 nerve. Cord: No cord compression or evidence of metastatic disease affecting the cord directly. Posterior Fossa, vertebral arteries,  paraspinal tissues: Negative Disc levels: No significant disc pathology. See above regarding osseous metastatic disease. IMPRESSION: Osseous metastatic disease throughout the region, primarily sclerotic. Expansile disease of the posterior elements on the left at T1, with a small amount of extraosseous tumor including dorsal epidural tumor, but without cord compression. There may be foraminal encroachment on the left at the C7-T1 level that could affect the left C8 nerve. Electronically Signed   By: Nelson Chimes M.D.   On: 04/15/2019 17:52   MR THORACIC SPINE WO CONTRAST  Result Date: 04/15/2019 CLINICAL DATA:  Metastatic prostate cancer. Worsening back pain EXAM: MRI THORACIC SPINE WITHOUT CONTRAST TECHNIQUE: Multiplanar, multisequence MR imaging of the thoracic spine was performed. No intravenous contrast was administered. COMPARISON:  Cervical spine MRI 04/15/2019. CT abdomen/pelvis 12/23/2018 FINDINGS: Alignment:  Physiologic. Vertebrae: Widespread osseous metastatic disease throughout the thoracic spine. The majority of the disease is low in signal on T1 and T2 weighted images suggesting largely sclerotic component. There are a few areas of more heterogeneously hyperintense T2 signal including within the T9 vertebral body and within the T11 vertebrae. There is an acute pathologic fracture of the T11 vertebral body with less than 25% height loss (series 6, image 5). Edema extends into the right pedicle. Additional pathologic fracture of the T12 vertebral body (series 6, image 10) with mild marrow edema. No significant vertebral body height loss. There is no bony retropulsion at either level. Expansile tumor within the posterior elements of T1 with associated extra osseous soft tissue mass closely approximating the dorsal epidural space (series 6, images 6-7; series 8, images 2-3). This finding was seen to better advantage on small field-of-view cervical spine images from same day MRI. No additional areas of  epidural tumor are seen within the remaining thoracic spine levels. Cord:  Normal signal and morphology. Paraspinal and other soft tissues: Small right and trace left pleural effusions. Small amount of T2 hyperintense material within the left mainstem bronchus, which could reflect mucosal secretion/debris (series 8, image 21). Disc levels: There is no focal disc protrusion, canal stenosis, or foraminal stenosis of the thoracic spine. Intervertebral disc heights are preserved. IMPRESSION: 1. Widespread osseous metastatic disease throughout the thoracic spine. 2. Acute pathologic fracture of the T11 vertebral body with less than 25% height loss. 3. Additional acute pathologic fracture of the T12 vertebral body without significant height loss. 4. Expansile tumor within the posterior elements of T1 with associated extra osseous soft tissue mass likely involving the dorsal epidural space. This was seen to better advantage on small field-of-view images from same day cervical spine MRI. 5. Small right and trace left pleural effusions. 6. Small amount of T2 hyperintense material within the left mainstem bronchus, possibly mucosal secretion/debris. These results were called by telephone at the time of interpretation on 04/15/2019 at 6:09 pm to provider Tyrone Nine, who verbally acknowledged these results. Electronically Signed   By: Davina Poke D.O.   On: 04/15/2019 18:13      ________________________________  Sheral Apley Tammi Klippel, M.D.

## 2019-04-19 ENCOUNTER — Inpatient Hospital Stay: Payer: Medicaid Other | Attending: Oncology

## 2019-04-19 ENCOUNTER — Other Ambulatory Visit: Payer: Self-pay

## 2019-04-19 DIAGNOSIS — M25559 Pain in unspecified hip: Secondary | ICD-10-CM | POA: Diagnosis not present

## 2019-04-19 DIAGNOSIS — C7951 Secondary malignant neoplasm of bone: Secondary | ICD-10-CM | POA: Insufficient documentation

## 2019-04-19 DIAGNOSIS — I10 Essential (primary) hypertension: Secondary | ICD-10-CM | POA: Diagnosis not present

## 2019-04-19 DIAGNOSIS — M25512 Pain in left shoulder: Secondary | ICD-10-CM | POA: Diagnosis not present

## 2019-04-19 DIAGNOSIS — Z9079 Acquired absence of other genital organ(s): Secondary | ICD-10-CM | POA: Insufficient documentation

## 2019-04-19 DIAGNOSIS — R112 Nausea with vomiting, unspecified: Secondary | ICD-10-CM | POA: Insufficient documentation

## 2019-04-19 DIAGNOSIS — G629 Polyneuropathy, unspecified: Secondary | ICD-10-CM | POA: Diagnosis not present

## 2019-04-19 DIAGNOSIS — C61 Malignant neoplasm of prostate: Secondary | ICD-10-CM | POA: Insufficient documentation

## 2019-04-19 DIAGNOSIS — Z79899 Other long term (current) drug therapy: Secondary | ICD-10-CM | POA: Insufficient documentation

## 2019-04-19 DIAGNOSIS — Z923 Personal history of irradiation: Secondary | ICD-10-CM | POA: Diagnosis not present

## 2019-04-19 DIAGNOSIS — M79606 Pain in leg, unspecified: Secondary | ICD-10-CM | POA: Diagnosis not present

## 2019-04-19 LAB — CBC WITH DIFFERENTIAL (CANCER CENTER ONLY)
Abs Immature Granulocytes: 0.11 10*3/uL — ABNORMAL HIGH (ref 0.00–0.07)
Basophils Absolute: 0 10*3/uL (ref 0.0–0.1)
Basophils Relative: 0 %
Eosinophils Absolute: 0.2 10*3/uL (ref 0.0–0.5)
Eosinophils Relative: 2 %
HCT: 31.5 % — ABNORMAL LOW (ref 39.0–52.0)
Hemoglobin: 9.6 g/dL — ABNORMAL LOW (ref 13.0–17.0)
Immature Granulocytes: 1 %
Lymphocytes Relative: 12 %
Lymphs Abs: 1 10*3/uL (ref 0.7–4.0)
MCH: 26.1 pg (ref 26.0–34.0)
MCHC: 30.5 g/dL (ref 30.0–36.0)
MCV: 85.6 fL (ref 80.0–100.0)
Monocytes Absolute: 0.7 10*3/uL (ref 0.1–1.0)
Monocytes Relative: 8 %
Neutro Abs: 6.5 10*3/uL (ref 1.7–7.7)
Neutrophils Relative %: 77 %
Platelet Count: 322 10*3/uL (ref 150–400)
RBC: 3.68 MIL/uL — ABNORMAL LOW (ref 4.22–5.81)
RDW: 17.5 % — ABNORMAL HIGH (ref 11.5–15.5)
WBC Count: 8.5 10*3/uL (ref 4.0–10.5)
nRBC: 0 % (ref 0.0–0.2)

## 2019-04-19 LAB — CMP (CANCER CENTER ONLY)
ALT: 15 U/L (ref 0–44)
AST: 13 U/L — ABNORMAL LOW (ref 15–41)
Albumin: 2.9 g/dL — ABNORMAL LOW (ref 3.5–5.0)
Alkaline Phosphatase: 284 U/L — ABNORMAL HIGH (ref 38–126)
Anion gap: 14 (ref 5–15)
BUN: 33 mg/dL — ABNORMAL HIGH (ref 6–20)
CO2: 27 mmol/L (ref 22–32)
Calcium: 9.2 mg/dL (ref 8.9–10.3)
Chloride: 101 mmol/L (ref 98–111)
Creatinine: 2.16 mg/dL — ABNORMAL HIGH (ref 0.61–1.24)
GFR, Est AFR Am: 38 mL/min — ABNORMAL LOW (ref >60)
GFR, Estimated: 33 mL/min — ABNORMAL LOW (ref >60)
Glucose, Bld: 206 mg/dL — ABNORMAL HIGH (ref 70–99)
Potassium: 4.1 mmol/L (ref 3.5–5.1)
Sodium: 142 mmol/L (ref 135–145)
Total Bilirubin: 0.3 mg/dL (ref 0.3–1.2)
Total Protein: 7.5 g/dL (ref 6.5–8.1)

## 2019-04-20 ENCOUNTER — Ambulatory Visit
Admission: RE | Admit: 2019-04-20 | Discharge: 2019-04-20 | Disposition: A | Discharge status: Home / Self Care | Payer: Medicaid Other | Source: Ambulatory Visit | Attending: Urology | Admitting: Urology

## 2019-04-20 ENCOUNTER — Ambulatory Visit
Admission: RE | Admit: 2019-04-20 | Discharge: 2019-04-20 | Disposition: A | Discharge status: Home / Self Care | Payer: Medicaid Other | Source: Ambulatory Visit | Attending: Radiation Oncology | Admitting: Radiation Oncology

## 2019-04-20 ENCOUNTER — Other Ambulatory Visit: Payer: Self-pay

## 2019-04-20 ENCOUNTER — Other Ambulatory Visit: Payer: Self-pay | Admitting: Urology

## 2019-04-20 ENCOUNTER — Inpatient Hospital Stay
Admission: RE | Admit: 2019-04-20 | Discharge: 2019-04-20 | Disposition: A | Discharge status: Home / Self Care | Payer: Medicaid Other | Source: Ambulatory Visit | Attending: Urology | Admitting: Urology

## 2019-04-20 ENCOUNTER — Encounter: Payer: Self-pay | Admitting: Urology

## 2019-04-20 VITALS — BP 117/78 | HR 125 | Temp 98.0°F | Resp 18 | Wt 248.0 lb

## 2019-04-20 DIAGNOSIS — C61 Malignant neoplasm of prostate: Secondary | ICD-10-CM

## 2019-04-20 DIAGNOSIS — M25551 Pain in right hip: Secondary | ICD-10-CM | POA: Diagnosis not present

## 2019-04-20 DIAGNOSIS — Z510 Encounter for antineoplastic radiation therapy: Secondary | ICD-10-CM | POA: Insufficient documentation

## 2019-04-20 DIAGNOSIS — C7951 Secondary malignant neoplasm of bone: Secondary | ICD-10-CM

## 2019-04-20 LAB — PROSTATE-SPECIFIC AG, SERUM (LABCORP): Prostate Specific Ag, Serum: 19.1 ng/mL — ABNORMAL HIGH (ref 0.0–4.0)

## 2019-04-20 MED ORDER — MORPHINE SULFATE (PF) 4 MG/ML IV SOLN
2.0000 mg | Freq: Once | INTRAVENOUS | Status: AC
  Filled 2019-04-20: qty 0.5

## 2019-04-20 MED ORDER — HYDROMORPHONE HCL 4 MG PO TABS: 4.0000 mg | ORAL_TABLET | ORAL | 0 refills | Status: DC | PRN

## 2019-04-20 MED ORDER — MORPHINE SULFATE 4 MG/ML IJ SOLN: 2.0000 mg | Freq: Once | INTRAMUSCULAR | Status: DC

## 2019-04-20 MED ORDER — MORPHINE SULFATE 4 MG/ML IJ SOLN
2.0000 mg | Freq: Once | INTRAMUSCULAR | Status: AC
  Administered 2019-04-20: 10:00:00 2 mg via INTRAMUSCULAR
  Filled 2019-04-20: qty 1

## 2019-04-20 MED FILL — HYDROmorphone HCL 4 MG TABS: 4 | 5 days supply | Qty: 30 | Fill #0

## 2019-04-20 NOTE — Progress Notes (Signed)
Radiation Oncology         (336) [phone] ________________________________  Name: Jeffery [name] MRN: [id number]  Date: 04/20/2019  DOB: 07/04/1960  Outpatient Re-Consultation  CC: [name], MD  [name], *  Diagnosis:   59 y.o. 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  Interval Since Last Radiation:  2 months  02/01/19 - 02/12/19:  The patient received 30 Gy in 10 fractions to the painful sites of metastatic disease in the right hip and right scapula.  Narrative:  The patient returns today for re-evaluation. In summary, this is a 59 y.o. gentleman who was diagnosed with Gleason 4+5 prostate cancer on 09/17/2018 with a PSA of 410. He was found to have findings suspicious for metastatic bladder cancer on staging CT A/P on 10/11/2018. He proceeded to bilateral orchiectomy for LT-ADT. He was also started on Zytiga on 11/16/2018 under Dr. Alen Blew. Restaging CT A/P on 12/23/2018 revealed improvement to adenopathy but progression of bone metastases. He was referred to [place] for consideration of palliative radiation to the painful sites of metastases in the right hip and right scapula, treated as outlined above. He continues on daily Zytiga/predinsone under the care and direction of Dr. Alen Blew.  During his recent radiation treatments, he developed nagging pain in his lower back which was persistent and worse with activity, limiting his quality of life.  We discussed treating the disease in the lumbar spine at that time but he wished to postpone further radiation treatment until 04/2019, after the holidays.  At the time of his routine 1 month follow up visit, he reported significant improvement in the right shoulder and right hip pain since completion of radiation.  He also confirmed that the low back pain remained persistent but tolerable at that time and denied any other focal sites of pain or weakness.  Since that time, he presented to  the ED on 04/15/2019 with progressive pain in the upper mid-back and left arm numbness. He underwent cervical and thoracic spine MRI at that time, which revealed widespread osseous metastatic disease throughout the thoracic spine with expansile disease of the posterior elements on the left at T1, with a small amount of extraosseous tumor including dorsal epidural tumor, but without cord compression, likely causing foraminal encroachment on the left at the C7-T1 level that could affect the left C8 nerve.disease, attributing to the numbness/weakness in the left forearm.  Additionally noted were acute pathologic fractures of T11 and T12 with minimal height loss.  On review of systems, the patient reports severe pain and numbness in his left forearm as well as persistent mid-back pain at the level of C7-T1 which rates a 7/10 on the pain scale and mid-line low back pain which is persistent and rates a 4-5/10 on pain scale.  His primary complaint is of pain mostly in his upper thoracic spine and base of neck but reports the pain had gone to the base of his skull and into his head over the weekend but has since resolved. He also reports stabbing pain in the lateral left hip with intermittent, sharp shooting pains that come on quickly and dissipates almost just as quickly.  The hip pains seem to be aggravated with weightbearing activity and improved with rest. He denies paraesthesias in the LEs or groin pain. All of his pain seems to be related to the nerve compressions in his back, as seen on recent scans.   ALLERGIES:  has No Known Allergies.  Meds: Current Outpatient Medications  Medication Sig Dispense Refill  . Amino Acids-Protein Hydrolys (FEEDING SUPPLEMENT, PRO-STAT SUGAR FREE 64,) LIQD Take 30 mLs by mouth 3 (three) times daily. (Patient not taking: Reported on 03/31/2019) 887 mL 0  . Blood Glucose Monitoring Suppl (RELION CONFIRM GLUCOSE MONITOR) w/Device KIT Use 4 times daily before meals and bedtime  as directed. (Patient not taking: Reported on 03/31/2019) 1 kit 0  . feeding supplement, ENSURE ENLIVE, (ENSURE ENLIVE) LIQD Take 237 mLs by mouth daily. 237 mL 12  . finasteride (PROSCAR) 5 MG tablet Take 5 mg by mouth daily.    . furosemide (LASIX) 40 MG tablet Take 40 mg by mouth daily.    [name] gabapentin (NEURONTIN) 300 MG capsule Take 1 capsule (300 mg total) by mouth 3 (three) times daily. (Patient taking differently: Take 300-600 mg by mouth See admin instructions. 326m in am and 6038min evening) 60 capsule 1  . glucose blood (RELION GLUCOSE TEST STRIPS) test strip Use as instructed (Patient not taking: Reported on 03/31/2019) 100 each 12  . HYDROcodone-acetaminophen (NORCO/VICODIN) 5-325 MG tablet TAKE 1 - 2 TABLETS BY MOUTH EVERY 4 - 6 HOURS AS NEEDED FOR PAIN **MAX OF 8 TABLETS IN 24 HOURS** (Patient taking differently: Take 1-2 tablets by mouth See admin instructions. Takes 1 tablet in am and 2 tablets in evening) 40 tablet 0  . Lancets 30G MISC Use 4 times daily before meals as directed. (Patient not taking: Reported on 03/31/2019) 100 each 0  . metFORMIN (GLUCOPHAGE-XR) 500 MG 24 hr tablet Take 500 mg by mouth 2 (two) times daily.     . Multiple Vitamin (MULTIVITAMIN WITH MINERALS) TABS tablet Take 1 tablet by mouth daily.    . predniSONE (DELTASONE) 5 MG tablet Take 1 tablet (5 mg total) by mouth daily with breakfast. 90 tablet 3  . tamsulosin (FLOMAX) 0.4 MG CAPS capsule Take 2 capsules (0.8 mg total) by mouth daily after supper.    . vitamin B-12 (CYANOCOBALAMIN) 1000 MCG tablet Take 1 tablet (1,000 mcg total) by mouth daily. 30 tablet 0  . ZYTIGA 250 MG tablet TAKE 4 TABLETS BY MOUTH ONCE DAILY AS DIRECTED.  TAKE 1 HOUR BEFORE OR 2 HOURS AFTER A MEAL (Patient taking differently: Take 1,000 mg by mouth daily. ) 120 tablet 10   No current facility-administered medications for this encounter.    Physical Findings:  vitals were not taken for this visit.   /10 In general this is a  well appearing Caucasian male in no acute distress. He's alert and oriented x4 and appropriate throughout the examination. Cardiopulmonary assessment is negative for acute distress and he exhibits normal effort.    Lab Findings: Lab Results  Component Value Date   WBC 8.5 04/19/2019   HGB 9.6 (L) 04/19/2019   HCT 31.5 (L) 04/19/2019   MCV 85.6 04/19/2019   PLT 322 04/19/2019     Radiographic Findings: DG Chest 2 View  Result Date: 04/15/2019 CLINICAL DATA:  Pt states he has Ca with Mets, Had a MRI which show massive spinal involvement that he feels is cause of pain. Copy with him. He had pneumonia last month, states Rt chest feels cold. Smoked for 40 years. Hx of HTN and DM. EXAM: CHEST - 2 VIEW COMPARISON:  Chest radiograph 01/27/2019 FINDINGS: Stable cardiomediastinal contours. Mild increase in size of a small to moderate right pleural effusion. Heterogeneous opacities in the right lower lobe may reflect atelectasis, infiltrate not excluded. There are new small nodules  in the bilateral lungs some of which appear calcified. No pneumothorax. Diffuse sclerotic bony lesions consistent with known metastasis. IMPRESSION: 1. Mild increase in size of a small to moderate right pleural effusion. 2. Heterogeneous opacities in the right lower lobe may reflect atelectasis, infiltrate not excluded. 3. New small nodules in the bilateral lungs, some of which appear calcified, are indeterminate. Chest CT may be considered for further evaluation. 4. Diffuse sclerotic bony lesions consistent with history of metastasis. Electronically Signed   By: Audie Pinto M.D.   On: 04/15/2019 14:52   MR Cervical Spine Wo Contrast  Result Date: 04/15/2019 CLINICAL DATA:  Metastatic prostate cancer. Worsening neck and back pain. EXAM: MRI CERVICAL SPINE WITHOUT CONTRAST TECHNIQUE: Multiplanar, multisequence MR imaging of the cervical spine was performed. No intravenous contrast was administered. COMPARISON:  No  previous study available. FINDINGS: Alignment: Normal Vertebrae: Widespread sclerotic change throughout the bones consistent with widespread metastatic prostate carcinoma. There is no evidence of pathologic fracture. Most of the metastatic disease appears to be sclerotic and intra osseous. More cellular type disease is seen affecting the C3 vertebral body but without evidence of extraosseous tumor. More cellular disease is present affecting the posterior elements T1, with some adjacent extraosseous soft tissue tumor including some dorsal epidural disease. No sign of cord compression however. There may be foraminal encroachment on the left at this level that could affect the C8 nerve. Cord: No cord compression or evidence of metastatic disease affecting the cord directly. Posterior Fossa, vertebral arteries, paraspinal tissues: Negative Disc levels: No significant disc pathology. See above regarding osseous metastatic disease. IMPRESSION: Osseous metastatic disease throughout the region, primarily sclerotic. Expansile disease of the posterior elements on the left at T1, with a small amount of extraosseous tumor including dorsal epidural tumor, but without cord compression. There may be foraminal encroachment on the left at the C7-T1 level that could affect the left C8 nerve. Electronically Signed   By: Nelson Chimes M.D.   On: 04/15/2019 17:52   MR THORACIC SPINE WO CONTRAST  Result Date: 04/15/2019 CLINICAL DATA:  Metastatic prostate cancer. Worsening back pain EXAM: MRI THORACIC SPINE WITHOUT CONTRAST TECHNIQUE: Multiplanar, multisequence MR imaging of the thoracic spine was performed. No intravenous contrast was administered. COMPARISON:  Cervical spine MRI 04/15/2019. CT abdomen/pelvis 12/23/2018 FINDINGS: Alignment:  Physiologic. Vertebrae: Widespread osseous metastatic disease throughout the thoracic spine. The majority of the disease is low in signal on T1 and T2 weighted images suggesting largely  sclerotic component. There are a few areas of more heterogeneously hyperintense T2 signal including within the T9 vertebral body and within the T11 vertebrae. There is an acute pathologic fracture of the T11 vertebral body with less than 25% height loss (series 6, image 5). Edema extends into the right pedicle. Additional pathologic fracture of the T12 vertebral body (series 6, image 10) with mild marrow edema. No significant vertebral body height loss. There is no bony retropulsion at either level. Expansile tumor within the posterior elements of T1 with associated extra osseous soft tissue mass closely approximating the dorsal epidural space (series 6, images 6-7; series 8, images 2-3). This finding was seen to better advantage on small field-of-view cervical spine images from same day MRI. No additional areas of epidural tumor are seen within the remaining thoracic spine levels. Cord:  Normal signal and morphology. Paraspinal and other soft tissues: Small right and trace left pleural effusions. Small amount of T2 hyperintense material within the left mainstem bronchus, which could reflect mucosal secretion/debris (series  8, image 21). Disc levels: There is no focal disc protrusion, canal stenosis, or foraminal stenosis of the thoracic spine. Intervertebral disc heights are preserved. IMPRESSION: 1. Widespread osseous metastatic disease throughout the thoracic spine. 2. Acute pathologic fracture of the T11 vertebral body with less than 25% height loss. 3. Additional acute pathologic fracture of the T12 vertebral body without significant height loss. 4. Expansile tumor within the posterior elements of T1 with associated extra osseous soft tissue mass likely involving the dorsal epidural space. This was seen to better advantage on small field-of-view images from same day cervical spine MRI. 5. Small right and trace left pleural effusions. 6. Small amount of T2 hyperintense material within the left mainstem bronchus,  possibly mucosal secretion/debris. These results were called by telephone at the time of interpretation on 04/15/2019 at 6:09 pm to provider Tyrone Nine, who verbally acknowledged these results. Electronically Signed   By: Davina Poke D.O.   On: 04/15/2019 18:13    Impression/Plan: 1. 59 y.o. gentleman with advanced stage IV prostate cancer with painful spinal metastases in the thoracic and lumbar spine. He has known widespread osseous metastatic disease throughout the thoracolumbar spine causing low back pain that is progressive and beginning to interfere with his ability to remain active due to pain with activities. Today, we talked to the patient about the findings and workup thus far. We discussed the natural history of metastatic carcinoma and general treatment, highlighting the role of palliative radiotherapy in the management of painful osseous metastatic disease. We discussed the available radiation techniques, and focused on the details and logistics of delivery. We reviewed the anticipated acute and late sequelae associated with radiation in this setting. The patient was encouraged to ask questions that were answered to his satisfaction.  At the conclusion of our conversation, the patient is interested in proceeding with palliative radiation to the painful sites of disease in the thoracic and lumbar spine.  He is scheduled for CT SIM to follow today's discussion for treatment planning purposes in anticipation of beginning a 10 day course of palliative radiation to the painful disease at C6-T2 and L1-L3 on Thursday 04/22/19.  He has freely signed written consent to proceed today in the office and a copy of this document will be placed in his medical record.  He continues to tolerate the Zytiga well and PSA continues to respond appropriately with his most recent PSA on 03/05/19 at 11.4.  The current plan is to continue on daily Zytiga/predinsone under the care and direction of Dr. Alen Blew.  He is s/p  bilateral orchiectomy on 10/12/18 for permanent ADT.  His next scheduled follow up with Dr. Alen Blew is on 04/22/2019 who will continue to follow him regularly for continued systemic disease management.    Nicholos Johns, PA-C    Tyler Pita, MD  [facility] Direct Dial: [phone]  Fax: [phone] [email or web address]  Skype  [email or web address]   This document serves as a record of services personally performed by [employer], PA-C. It was created on her behalf by Wilburn Mylar, a trained medical scribe. The creation of this record is based on the scribe's personal observations and the provider's statements to them. This document has been checked and approved by the attending provider.

## 2019-04-21 NOTE — Progress Notes (Signed)
See progress note under physician encounter. 

## 2019-04-22 ENCOUNTER — Ambulatory Visit
Admission: RE | Admit: 2019-04-22 | Discharge: 2019-04-22 | Disposition: A | Discharge status: Home / Self Care | Payer: Medicaid Other | Source: Ambulatory Visit | Attending: Radiation Oncology | Admitting: Radiation Oncology

## 2019-04-22 ENCOUNTER — Inpatient Hospital Stay (HOSPITAL_BASED_OUTPATIENT_CLINIC_OR_DEPARTMENT_OTHER): Payer: Medicaid Other | Admitting: Oncology

## 2019-04-22 ENCOUNTER — Other Ambulatory Visit: Payer: Self-pay

## 2019-04-22 VITALS — BP 117/73 | HR 122 | Temp 98.5°F | Resp 17 | Ht 69.0 in | Wt 249.2 lb

## 2019-04-22 DIAGNOSIS — C61 Malignant neoplasm of prostate: Secondary | ICD-10-CM | POA: Diagnosis not present

## 2019-04-22 NOTE — Progress Notes (Signed)
Hematology and Oncology Follow Up Visit  Jeffery [name] [id number] 1960-10-02 59 y.o. 04/22/2019 8:30 AM Jeffery [name], MDElkins, Jeffery [name], *   Principle Diagnosis: 59 year old man with advanced prostate cancer with no disease to the bone diagnosed in June 2020.  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    Prior Therapy: He is status post biopsy obtained on 09/17/2018 for his prostate performed by Dr. Gloriann Loan.  This showed a Gleason score of 9 and 12 cores.  He is status post orchiectomy completed on October 12, 2018.  He is status post radiation to the hip and right scapula completed in October 2020.  He received 30 Gy in 10 fractions at that time.  Current therapy:   Zytiga 1000 mg daily started in August 2020.  Under evaluation for palliative radiation to the cervical spine.   Interim History: Jeffery [name] is here for a follow-up visit.  Since the last visit, he has been reporting diffuse pain that is migratory in nature.  He has reported hip pain leg pain and most recently left-sided shoulder pain and arm pain.  He has reported increased arm numbness and was seen in the emergency department and had a MRI of the cervical and thoracic spine on April 15, 2019.  MRI showed osseous metastatic disease primarily at T1 with extraosseous tumor including the dorsal epidural tumor without cord compression.  He is currently under evaluation to start palliative radiation therapy.  He continues to have pain control issues and currently on hydrocodone, hydromorphone as well as Neurontin.  He is appetite has been overall poor although still has reasonable quality of life and performance status.  He continues to tolerate Zytiga without any complaints at this time.          Medications: Reviewed without changes. Current Outpatient Medications  Medication Sig Dispense Refill  . Amino Acids-Protein Hydrolys (FEEDING SUPPLEMENT,  PRO-STAT SUGAR FREE 64,) LIQD Take 30 mLs by mouth 3 (three) times daily. 887 mL 0  . Blood Glucose Monitoring Suppl (RELION CONFIRM GLUCOSE MONITOR) w/Device KIT Use 4 times daily before meals and bedtime as directed. (Patient not taking: Reported on 03/31/2019) 1 kit 0  . feeding supplement, ENSURE ENLIVE, (ENSURE ENLIVE) LIQD Take 237 mLs by mouth daily. 237 mL 12  . finasteride (PROSCAR) 5 MG tablet Take 5 mg by mouth daily.    . furosemide (LASIX) 40 MG tablet Take 40 mg by mouth daily.    [name] gabapentin (NEURONTIN) 300 MG capsule Take 1 capsule (300 mg total) by mouth 3 (three) times daily. (Patient taking differently: Take 300-600 mg by mouth See admin instructions. 369m in am and 6045min evening) 60 capsule 1  . glucose blood (RELION GLUCOSE TEST STRIPS) test strip Use as instructed (Patient not taking: Reported on 03/31/2019) 100 each 12  . HYDROcodone-acetaminophen (NORCO/VICODIN) 5-325 MG tablet TAKE 1 - 2 TABLETS BY MOUTH EVERY 4 - 6 HOURS AS NEEDED FOR PAIN **MAX OF 8 TABLETS IN 24 HOURS** (Patient taking differently: Take 1-2 tablets by mouth See admin instructions. Takes 1 tablet in am and 2 tablets in evening) 40 tablet 0  . HYDROmorphone (DILAUDID) 4 MG tablet Take 1 tablet (4 mg total) by mouth every 4 (four) hours as needed for severe pain. 30 tablet 0  . Lancets 30G MISC Use 4 times daily before meals as directed. (Patient not taking: Reported on 03/31/2019) 100 each 0  . metFORMIN (GLUCOPHAGE-XR) 500 MG 24  hr tablet Take 500 mg by mouth 2 (two) times daily.     . Multiple Vitamin (MULTIVITAMIN WITH MINERALS) TABS tablet Take 1 tablet by mouth daily.    . predniSONE (DELTASONE) 5 MG tablet Take 1 tablet (5 mg total) by mouth daily with breakfast. 90 tablet 3  . tamsulosin (FLOMAX) 0.4 MG CAPS capsule Take 2 capsules (0.8 mg total) by mouth daily after supper.    . vitamin B-12 (CYANOCOBALAMIN) 1000 MCG tablet Take 1 tablet (1,000 mcg total) by mouth daily. 30 tablet 0  . ZYTIGA 250  MG tablet TAKE 4 TABLETS BY MOUTH ONCE DAILY AS DIRECTED.  TAKE 1 HOUR BEFORE OR 2 HOURS AFTER A MEAL (Patient taking differently: Take 1,000 mg by mouth daily. ) 120 tablet 10   Current Facility-Administered Medications  Medication Dose Route Frequency Provider Last Rate Last Admin  . morphine 4 MG/ML injection 2 mg  2 mg Intramuscular Once Bruning, Ashlyn, PA-C         Allergies: No Known Allergies  Past Medical History, Surgical history, Social history, and Family History updated without any changes.   Physical Exam:  Blood pressure 117/73, pulse (!) 122, temperature 98.5 F (36.9 C), temperature source Temporal, resp. rate 17, height _0  (1.753 m), weight 249 lb 3.2 oz (113 kg), SpO2 91 %.     ECOG: 1    General appearance: Alert, awake without any distress. Head: Atraumatic without abnormalities Oropharynx: Without any thrush or ulcers. Eyes: No scleral icterus. Lymph nodes: No lymphadenopathy noted in the cervical, supraclavicular, or axillary nodes Heart:regular rate and rhythm, without any murmurs or gallops.   Lung: Clear to auscultation without any rhonchi, wheezes or dullness to percussion. Abdomin: Soft, nontender without any shifting dullness or ascites. Musculoskeletal: No clubbing or cyanosis. Neurological: No motor or sensory deficits. Skin: No rashes or lesions. Psychiatric: Mood and affect appeared normal.        Lab Results: Lab Results  Component Value Date   WBC 8.5 04/19/2019   HGB 9.6 (L) 04/19/2019   HCT 31.5 (L) 04/19/2019   MCV 85.6 04/19/2019   PLT 322 04/19/2019     Chemistry      Component Value Date/Time   NA 142 04/19/2019 0827   K 4.1 04/19/2019 0827   CL 101 04/19/2019 0827   CO2 27 04/19/2019 0827   BUN 33 (H) 04/19/2019 0827   CREATININE 2.16 (H) 04/19/2019 0827      Component Value Date/Time   CALCIUM 9.2 04/19/2019 0827   ALKPHOS 284 (H) 04/19/2019 0827   AST 13 (L) 04/19/2019 0827   ALT 15 04/19/2019 0827    BILITOT 0.3 04/19/2019 0827     Results for Jeffery [name], Jeffery [name] (MRN [id number]) as of 04/22/2019 08:31  Ref. Range 01/22/2019 11:20 03/05/2019 07:55 04/19/2019 08:27  Prostate Specific Ag, Serum Latest Ref Range: 0.0 - 4.0 ng/mL 19.8 (H) 11.4 (H) 19.1 (H)      Impression and Plan:   59 year old man with:  1.  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    He is currently on Zytiga without any major complications.  He had a reasonable PSA response with a nadir of 11.4 and currently around 19.  Risks and benefits of continuing this therapy was discussed.  The role of systemic chemotherapy was also reviewed as a potential option if he developed castration-resistant disease.  Complication associated with chemotherapy if needed to include nausea, vomiting, myelosuppression and worsening neuropathy.  Given his aggressive features of his cancer is likely that he will require chemotherapy relatively soon.  2.  Androgen deprivation therapy: We will check testosterone level with future visits to ensure adequate castration at this time.  He status post orchiectomy.  3.  Bone directed therapy: Consideration for Delton See were reviewed today.  Recommended calcium and vitamin D supplements and obtaining dental clearance before consideration for starting bone directed therapy.  4.  Prognosis and goals of care: Therapy remains palliative at this time although aggressive measures are warranted given his reasonable performance status.  5.  Hypertension: Blood pressure remains reasonable at this time.  6.  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  7.  Pain control: He is prescribed hydrocodone and hydromorphone in addition to Neurontin.  I recommended increasing his Neurontin to 600 mg 3 times daily given the improvement he has noticed with escalating doses.  7. Follow-up: In 6 weeks for  repeat evaluation.  30  minutes was spent on this encounter today.  The time was dedicated to reviewing laboratory data, pathology reports, disease status update and future plan of care.  Also reviewed complications related to current and future therapy.    Zola Button, MD 1/7/20218:30 AM therapy:

## 2019-04-23 ENCOUNTER — Telehealth: Payer: Self-pay | Admitting: Oncology

## 2019-04-23 ENCOUNTER — Ambulatory Visit
Admission: RE | Admit: 2019-04-23 | Discharge: 2019-04-23 | Disposition: A | Discharge status: Home / Self Care | Payer: Medicaid Other | Source: Ambulatory Visit | Attending: Radiation Oncology | Admitting: Radiation Oncology

## 2019-04-23 ENCOUNTER — Other Ambulatory Visit: Payer: Self-pay

## 2019-04-23 DIAGNOSIS — C61 Malignant neoplasm of prostate: Secondary | ICD-10-CM | POA: Diagnosis not present

## 2019-04-23 NOTE — Telephone Encounter (Signed)
Scheduled appt per 1/7 los.  Sent a message to HIM pool to get a calendar mailed out. 

## 2019-04-23 NOTE — Progress Notes (Signed)
  Radiation Oncology         (336) [phone] ________________________________  Name: Jeffery [name] MRN: [id number]  Date: 04/20/2019  DOB: 1961/02/22  SIMULATION AND TREATMENT PLANNING NOTE    ICD-10-CM   1. Prostate cancer metastatic to multiple sites [facility])  C61 morphine 4 MG/ML injection 2 mg    DISCONTINUED: morphine 4 MG/ML injection 2 mg  2. Metastatic cancer to spine [facility])  C79.51     DIAGNOSIS:  59 y.o. patient with C7-T1 and L2 spinal metastasis  NARRATIVE:  The patient was brought to the Branch.  Identity was confirmed.  All relevant records and images related to the planned course of therapy were reviewed.  The patient freely provided informed written consent to proceed with treatment after reviewing the details related to the planned course of therapy. The consent form was witnessed and verified by the simulation staff.  Then, the patient was set-up in a stable reproducible  supine position for radiation therapy.  CT images were obtained.  Surface markings were placed.  The CT images were loaded into the planning software.  Then the target and avoidance structures were contoured including kidneys.  Treatment planning then occurred.  The radiation prescription was entered and confirmed.  Then, I designed and supervised the construction of a total of 3 medically necessary complex treatment devices with VacLoc positioner and 5 MLCs to shield kidneys in lumbar, and lungs/larynx for upper fields.  I have requested : 3D Simulation  I have requested a DVH of the following structures: Left Kidney, Right Kidney and target.  PLAN:  The patient will receive 30 Gy in 10 fractions.  ________________________________  Sheral Apley Tammi Klippel, M.D.

## 2019-04-26 ENCOUNTER — Other Ambulatory Visit: Payer: Self-pay | Admitting: Oncology

## 2019-04-26 ENCOUNTER — Ambulatory Visit
Admission: RE | Admit: 2019-04-26 | Discharge: 2019-04-26 | Disposition: A | Discharge status: Home / Self Care | Payer: Medicaid Other | Source: Ambulatory Visit | Attending: Radiation Oncology | Admitting: Radiation Oncology

## 2019-04-26 ENCOUNTER — Other Ambulatory Visit: Payer: Self-pay

## 2019-04-26 DIAGNOSIS — C61 Malignant neoplasm of prostate: Secondary | ICD-10-CM | POA: Diagnosis not present

## 2019-04-26 DIAGNOSIS — M7918 Myalgia, other site: Secondary | ICD-10-CM

## 2019-04-26 MED FILL — HYDROCODON-APAP 5-325: 5-325 | 5 days supply | Qty: 40 | Fill #0

## 2019-04-27 ENCOUNTER — Other Ambulatory Visit: Payer: Self-pay

## 2019-04-27 ENCOUNTER — Ambulatory Visit
Admission: RE | Admit: 2019-04-27 | Discharge: 2019-04-27 | Disposition: A | Discharge status: Home / Self Care | Payer: Medicaid Other | Source: Ambulatory Visit | Attending: Radiation Oncology | Admitting: Radiation Oncology

## 2019-04-27 ENCOUNTER — Ambulatory Visit: Payer: Medicaid Other

## 2019-04-27 DIAGNOSIS — C61 Malignant neoplasm of prostate: Secondary | ICD-10-CM | POA: Diagnosis not present

## 2019-04-28 ENCOUNTER — Other Ambulatory Visit: Payer: Self-pay

## 2019-04-28 ENCOUNTER — Ambulatory Visit: Payer: Medicaid Other

## 2019-04-28 ENCOUNTER — Ambulatory Visit
Admission: RE | Admit: 2019-04-28 | Discharge: 2019-04-28 | Disposition: A | Discharge status: Home / Self Care | Payer: Medicaid Other | Source: Ambulatory Visit | Attending: Radiation Oncology | Admitting: Radiation Oncology

## 2019-04-28 DIAGNOSIS — C61 Malignant neoplasm of prostate: Secondary | ICD-10-CM | POA: Diagnosis not present

## 2019-04-28 MED FILL — GABAPENTIN 300 MG CAPSULE: 300 | 30 days supply | Qty: 150 | Fill #0

## 2019-04-29 ENCOUNTER — Ambulatory Visit
Admission: RE | Admit: 2019-04-29 | Discharge: 2019-04-29 | Disposition: A | Discharge status: Home / Self Care | Payer: Medicaid Other | Source: Ambulatory Visit | Attending: Radiation Oncology | Admitting: Radiation Oncology

## 2019-04-29 ENCOUNTER — Ambulatory Visit: Payer: Medicaid Other

## 2019-04-29 ENCOUNTER — Other Ambulatory Visit: Payer: Self-pay

## 2019-04-29 DIAGNOSIS — C61 Malignant neoplasm of prostate: Secondary | ICD-10-CM | POA: Diagnosis not present

## 2019-04-30 ENCOUNTER — Ambulatory Visit
Admission: RE | Admit: 2019-04-30 | Discharge: 2019-04-30 | Disposition: A | Discharge status: Home / Self Care | Payer: Medicaid Other | Source: Ambulatory Visit | Attending: Radiation Oncology | Admitting: Radiation Oncology

## 2019-04-30 ENCOUNTER — Other Ambulatory Visit: Payer: Self-pay | Admitting: Urology

## 2019-04-30 ENCOUNTER — Other Ambulatory Visit: Payer: Self-pay | Admitting: Radiation Oncology

## 2019-04-30 ENCOUNTER — Ambulatory Visit: Payer: Medicaid Other

## 2019-04-30 ENCOUNTER — Other Ambulatory Visit: Payer: Self-pay

## 2019-04-30 DIAGNOSIS — C61 Malignant neoplasm of prostate: Secondary | ICD-10-CM | POA: Diagnosis not present

## 2019-04-30 MED ORDER — HYDROMORPHONE HCL 4 MG PO TABS: 4.0000 mg | ORAL_TABLET | ORAL | 0 refills | Status: DC | PRN

## 2019-04-30 MED FILL — HYDROmorphone HCL 4 MG TABS: 4 | 10 days supply | Qty: 60 | Fill #0

## 2019-05-03 ENCOUNTER — Other Ambulatory Visit: Payer: Self-pay

## 2019-05-03 ENCOUNTER — Ambulatory Visit
Admission: RE | Admit: 2019-05-03 | Discharge: 2019-05-03 | Disposition: A | Discharge status: Home / Self Care | Payer: Medicaid Other | Source: Ambulatory Visit | Attending: Radiation Oncology | Admitting: Radiation Oncology

## 2019-05-03 ENCOUNTER — Ambulatory Visit: Payer: Medicaid Other

## 2019-05-03 DIAGNOSIS — C61 Malignant neoplasm of prostate: Secondary | ICD-10-CM | POA: Diagnosis not present

## 2019-05-04 ENCOUNTER — Ambulatory Visit: Payer: Medicaid Other

## 2019-05-04 ENCOUNTER — Other Ambulatory Visit: Payer: Self-pay

## 2019-05-04 ENCOUNTER — Ambulatory Visit
Admission: RE | Admit: 2019-05-04 | Discharge: 2019-05-04 | Disposition: A | Discharge status: Home / Self Care | Payer: Medicaid Other | Source: Ambulatory Visit | Attending: Radiation Oncology | Admitting: Radiation Oncology

## 2019-05-04 DIAGNOSIS — C61 Malignant neoplasm of prostate: Secondary | ICD-10-CM | POA: Diagnosis not present

## 2019-05-05 ENCOUNTER — Other Ambulatory Visit: Payer: Self-pay | Admitting: Urology

## 2019-05-05 ENCOUNTER — Ambulatory Visit: Payer: Medicaid Other

## 2019-05-05 ENCOUNTER — Encounter: Payer: Self-pay | Admitting: Radiation Oncology

## 2019-05-05 ENCOUNTER — Ambulatory Visit
Admission: RE | Admit: 2019-05-05 | Discharge: 2019-05-05 | Disposition: A | Discharge status: Home / Self Care | Payer: Medicaid Other | Source: Ambulatory Visit | Attending: Radiation Oncology | Admitting: Radiation Oncology

## 2019-05-05 ENCOUNTER — Other Ambulatory Visit: Payer: Self-pay | Admitting: Oncology

## 2019-05-05 DIAGNOSIS — M7918 Myalgia, other site: Secondary | ICD-10-CM

## 2019-05-05 DIAGNOSIS — C61 Malignant neoplasm of prostate: Secondary | ICD-10-CM | POA: Diagnosis not present

## 2019-05-05 MED ORDER — FENTANYL 25 MCG/HR TD PT72: 1.0000 | MEDICATED_PATCH | TRANSDERMAL | 0 refills | Status: DC

## 2019-05-05 MED FILL — HYDROCODON-APAP 5-325: 5-325 | 5 days supply | Qty: 40 | Fill #0

## 2019-05-06 ENCOUNTER — Ambulatory Visit: Payer: Medicaid Other

## 2019-05-06 ENCOUNTER — Telehealth: Payer: Self-pay | Admitting: Radiation Oncology

## 2019-05-06 NOTE — Telephone Encounter (Signed)
Please let the patient and the pharmacist know that the Fentanyl patch is to take the place of the Dilaudid.  He can continue to take the Vicodin every 4-6 hours prn for breakthrough pain and should keep a record of how often and how many Vicodin tablets he is taking so that Dr. Alen Blew can further adjust the Fentanyl patch dose as needed in an effort to get him more sustained pain relief with the patch and only needing the vicodin infrequently. If the vicodin does not help with the breakthrough pain, he will need to let us and/or Dr. Alen Blew know and could instead try the dilaudid prn for breakthrough pain but I agree, he should not be taking both the vicodin and dilaudid on top of the Fentanyl patch. He can continue taking Neurontin as prescribed. -Hanadi Stanly

## 2019-05-06 NOTE — Telephone Encounter (Signed)
Received voicemail message from [place], Katrina, requesting a return call. Phoned her back. She verbalized her concern about the high morphine equivalents the patient has been prescribed and his confusion about what he should be taking. She questions if she should fill the Fentanyl patch recently prescribed. She questions if the Fentanyl patch is taking the place of another pain medication he has. Presently the patient is prescribed gabapentin, fentanyl, hydromorphone and hydrocodone-acetaminophen. Katrina reports her understanding from the patient is that he is taking it all just at different times. Katrina request clarification and understands this RN will reach out to the providers then phone her back.

## 2019-05-07 ENCOUNTER — Ambulatory Visit: Payer: Medicaid Other

## 2019-05-07 ENCOUNTER — Other Ambulatory Visit: Payer: Self-pay | Admitting: Radiation Therapy

## 2019-05-07 ENCOUNTER — Telehealth: Payer: Self-pay | Admitting: Radiation Oncology

## 2019-05-07 NOTE — Telephone Encounter (Signed)
Phoned patient to provide clarification after speaking with pharmacist to ensure everyone has the correct understanding. Explained to the patient that the Fentanyl patch takes the place of the Dilaudid. Stressed he should stop taking the Dilaudid. Instructed him to continue Vicodin as prescribed every 4-6 hours for breakthrough pain. Encouraged patient to keep a record of how often and how many Vicodin tablets he is taking so that Dr. Alen Blew can further adjust the Fentanyl patch dose as needed in an effort to get him more sustained pain relief with the patch and only needing the vicodin infrequently. Encouraged patient to reach out to Dr. Alen Blew if the Vicodin doesn't help with the breakthrough pain. Explained he may also continue the Neurontin as prescribed. Patient verbalized understanding of all reviewed.   Patient reports that yesterday morning he began having "severe deep" low back pain. Patient explains he had to grab a rack in the store to keep from falling down yesterday when the pain hit. Patient denies numbness or tingling of lower extremities. Patient denies incontinence of bowel or bladder. Patient understands this RN will inform the providers of this finding and phone him back. Also, patient reports his left shoulder and forearm remain numb and "no better" since completing treatment on 05/05/2019.

## 2019-05-07 NOTE — Telephone Encounter (Signed)
Phoned [place] pharmacist, Suella Grove. Clarified per [employer], PA-C that the Fentanyl was prescribed to replace the Dilaudid. Reinforced the patient should not be taking both. Staya verbalized understanding.

## 2019-05-09 NOTE — Progress Notes (Signed)
  Radiation Oncology         (336) [phone] ________________________________  Name: Jeffery [name] MRN: [id number]  Date: 05/05/2019  DOB: April 10, 1961  End of Treatment Note  Diagnosis:   59 y.o. patient with C7-T1 and L2 spinal metastasis from stage IV prostate cancer  Indication for treatment:  Palliation       Radiation treatment dates:   1/7-20/21  Site/dose:    1.  The spine from C6-T2 was treated to 30 Gy in 10 fractions of 3 Gy 2.  The spine from L1-L3 was treated to 30 Gy in 10 fractions of 3 Gy  Beams/energy:       1.  The spine from C6-T2 was treated using a 2 field technique from static gantry angles 35 and 180 degrees 2.  The spine from L1-L3 was treated using a 3D field arrangement using 3 static gantry angles zero, 220 and 140  Narrative: The patient tolerated radiation treatment relatively well.     Plan: The patient has completed radiation treatment. The patient will return to radiation oncology clinic for routine followup in one month. I advised him to call or return sooner if he has any questions or concerns related to his recovery or treatment.  During radiation, he also noted forehead pain.  I correlated this with increased activity on his bone scan in the skull around the orbits.  I explained that palliative radiotherapy could be used in this setting, but, there would be risk of brow loss and eye dryness.    ________________________________  Sheral Apley Tammi Klippel, M.D.

## 2019-05-10 ENCOUNTER — Ambulatory Visit: Payer: Medicaid Other

## 2019-05-13 ENCOUNTER — Other Ambulatory Visit: Payer: Self-pay | Admitting: Oncology

## 2019-05-13 DIAGNOSIS — M7918 Myalgia, other site: Secondary | ICD-10-CM

## 2019-05-13 MED FILL — predniSONE 5 MG TABS: 5 | 90 days supply | Qty: 90 | Fill #2

## 2019-05-13 MED FILL — HYDROCODON-APAP 5-325: 5-325 | 5 days supply | Qty: 40 | Fill #0

## 2019-05-14 ENCOUNTER — Telehealth: Payer: Self-pay | Admitting: Radiation Oncology

## 2019-05-14 NOTE — Telephone Encounter (Signed)
Phoned patient back. Explained that per Dr. Tammi Klippel and Freeman Caldron, PA-C the recommendation is for him to see Dr. Hilma Favors with palliative care to discuss pain management or defer back to Dr. Alen Blew for adjustments with his pain medication. Patient reports the pain in his left hip has eased up since this morning but he requires a cane to ambulate. Patient reports the pain he felt in his right side has completely resolved. Patient states, "I don't care which one I see/speak with really." Explained this message would be shared with Dr. Alen Blew and we would defer to his recommendations and a staff member would be in touch with directions.

## 2019-05-14 NOTE — Telephone Encounter (Signed)
Sounds like he would be a good candidate for palliative care consult to help with pain management and goals of care discussion.  If he is opposed to palliative care consult, I would defer his pain medication adjustments to Dr. Alen Blew so that one provider is managing his pain medications. Ailene Ards

## 2019-05-14 NOTE — Telephone Encounter (Addendum)
Received voicemail from patient requesting return call "to discuss that medicine y'all put me on." Phoned patient back to inquire. Patient states, "after I put that pain patch on I left the world for three days." Patient goes onto explain he was unable to function while using the patch and took it off on Tuesday. Patient confirms wearing the patch for three days, taking it off on Tuesday, and not reapplying it. Questioned if the patient was using the patch alone or in combination with Vicodin and gabapentin. Patient confirmed he was using it in combination with his other medication. Reinforced that the Fentanyl was prescribed hoping he wouldn't require as much Vicodin. Patient verbalized understanding but confirm he is unable to take Fentanyl. Patient reports since Tuesday he has only taken gabapentin and Vicodin to manage his pain. Patient questions what he should do because his "pain is moving all around." Patient reports that yesterday the right side of his abdomen hurt but today his left hip hurts 8 on a scale of 0-10. This RN explained she will report her findings to the physician and phone him back with further direction. Patient verbalized understanding.

## 2019-05-19 ENCOUNTER — Encounter: Payer: Self-pay | Admitting: Urology

## 2019-05-19 ENCOUNTER — Telehealth: Payer: Self-pay

## 2019-05-19 ENCOUNTER — Other Ambulatory Visit: Payer: Self-pay | Admitting: Radiation Therapy

## 2019-05-19 DIAGNOSIS — R569 Unspecified convulsions: Secondary | ICD-10-CM

## 2019-05-19 NOTE — Telephone Encounter (Signed)
Spoke with patients mother she states he has had two episodes of seizure like activity. In the last couple of weeks. States patient does not really remember them happening at the time. She is concerned but this is the first documentation. Will discuss with Ashlyn and Dr Tammi Klippel.

## 2019-05-20 ENCOUNTER — Ambulatory Visit
Admission: RE | Admit: 2019-05-20 | Discharge: 2019-05-20 | Disposition: A | Discharge status: Home / Self Care | Payer: Medicaid Other | Source: Ambulatory Visit | Attending: Radiation Oncology | Admitting: Radiation Oncology

## 2019-05-20 ENCOUNTER — Ambulatory Visit (HOSPITAL_COMMUNITY)
Admission: RE | Admit: 2019-05-20 | Discharge: 2019-05-20 | Disposition: A | Discharge status: Home / Self Care | Payer: Medicaid Other | Source: Ambulatory Visit | Attending: Radiation Oncology | Admitting: Radiation Oncology

## 2019-05-20 ENCOUNTER — Other Ambulatory Visit: Payer: Self-pay

## 2019-05-20 ENCOUNTER — Telehealth: Payer: Self-pay | Admitting: Internal Medicine

## 2019-05-20 ENCOUNTER — Encounter: Payer: Self-pay | Admitting: Radiation Oncology

## 2019-05-20 ENCOUNTER — Inpatient Hospital Stay: Payer: Medicaid Other | Attending: Oncology | Admitting: Internal Medicine

## 2019-05-20 VITALS — BP 107/68 | HR 102 | Temp 98.0°F | Wt 232.0 lb

## 2019-05-20 DIAGNOSIS — Z9079 Acquired absence of other genital organ(s): Secondary | ICD-10-CM | POA: Insufficient documentation

## 2019-05-20 DIAGNOSIS — M25551 Pain in right hip: Secondary | ICD-10-CM | POA: Diagnosis not present

## 2019-05-20 DIAGNOSIS — C61 Malignant neoplasm of prostate: Secondary | ICD-10-CM

## 2019-05-20 DIAGNOSIS — R0602 Shortness of breath: Secondary | ICD-10-CM | POA: Diagnosis not present

## 2019-05-20 DIAGNOSIS — C7951 Secondary malignant neoplasm of bone: Secondary | ICD-10-CM | POA: Insufficient documentation

## 2019-05-20 DIAGNOSIS — I10 Essential (primary) hypertension: Secondary | ICD-10-CM | POA: Diagnosis not present

## 2019-05-20 DIAGNOSIS — Z923 Personal history of irradiation: Secondary | ICD-10-CM | POA: Insufficient documentation

## 2019-05-20 DIAGNOSIS — D649 Anemia, unspecified: Secondary | ICD-10-CM | POA: Insufficient documentation

## 2019-05-20 DIAGNOSIS — R200 Anesthesia of skin: Secondary | ICD-10-CM | POA: Insufficient documentation

## 2019-05-20 DIAGNOSIS — R05 Cough: Secondary | ICD-10-CM | POA: Diagnosis not present

## 2019-05-20 DIAGNOSIS — Q759 Congenital malformation of skull and face bones, unspecified: Secondary | ICD-10-CM | POA: Insufficient documentation

## 2019-05-20 DIAGNOSIS — R569 Unspecified convulsions: Secondary | ICD-10-CM | POA: Insufficient documentation

## 2019-05-20 DIAGNOSIS — G9341 Metabolic encephalopathy: Secondary | ICD-10-CM | POA: Diagnosis not present

## 2019-05-20 DIAGNOSIS — R0902 Hypoxemia: Secondary | ICD-10-CM | POA: Diagnosis not present

## 2019-05-20 DIAGNOSIS — Z8249 Family history of ischemic heart disease and other diseases of the circulatory system: Secondary | ICD-10-CM | POA: Diagnosis not present

## 2019-05-20 DIAGNOSIS — N289 Disorder of kidney and ureter, unspecified: Secondary | ICD-10-CM | POA: Diagnosis not present

## 2019-05-20 DIAGNOSIS — Z87891 Personal history of nicotine dependence: Secondary | ICD-10-CM | POA: Diagnosis not present

## 2019-05-20 DIAGNOSIS — E119 Type 2 diabetes mellitus without complications: Secondary | ICD-10-CM | POA: Diagnosis not present

## 2019-05-20 DIAGNOSIS — Z8042 Family history of malignant neoplasm of prostate: Secondary | ICD-10-CM | POA: Insufficient documentation

## 2019-05-20 DIAGNOSIS — R531 Weakness: Secondary | ICD-10-CM | POA: Insufficient documentation

## 2019-05-20 DIAGNOSIS — Z79899 Other long term (current) drug therapy: Secondary | ICD-10-CM | POA: Diagnosis not present

## 2019-05-20 DIAGNOSIS — G9340 Encephalopathy, unspecified: Secondary | ICD-10-CM | POA: Insufficient documentation

## 2019-05-20 DIAGNOSIS — R5381 Other malaise: Secondary | ICD-10-CM | POA: Insufficient documentation

## 2019-05-20 DIAGNOSIS — Z510 Encounter for antineoplastic radiation therapy: Secondary | ICD-10-CM | POA: Insufficient documentation

## 2019-05-20 IMAGING — MR MR HEAD WO/W CM
3 of 12 series · 7 of 48 positions shown · IV contrast (gadavist)
Comparison: None

CLINICAL DATA: Prostate cancer, seizure activity

EXAM:
MRI HEAD WITHOUT AND WITH CONTRAST
TECHNIQUE: Multiplanar, multiecho pulse sequences of the brain and surrounding
structures were obtained without and with intravenous contrast.
CONTRAST:  10mL GADAVIST GADOBUTROL 1 MMOL/ML IV SOLN

[Series 2: FLAIR · sagittal · 3.0mm · 0.23mm/px · 3 of 44 slices shown (1 of 2)]
[im 1/44]
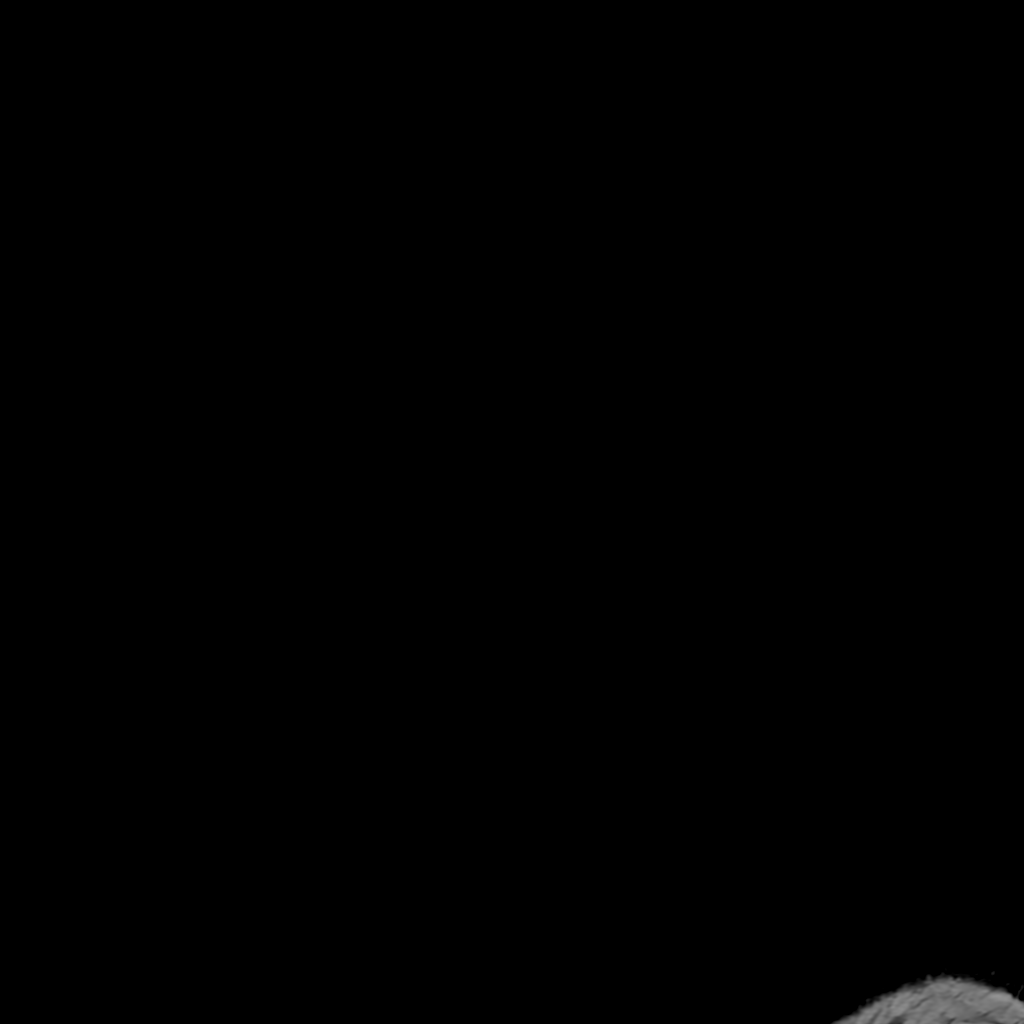
[im 22/44]
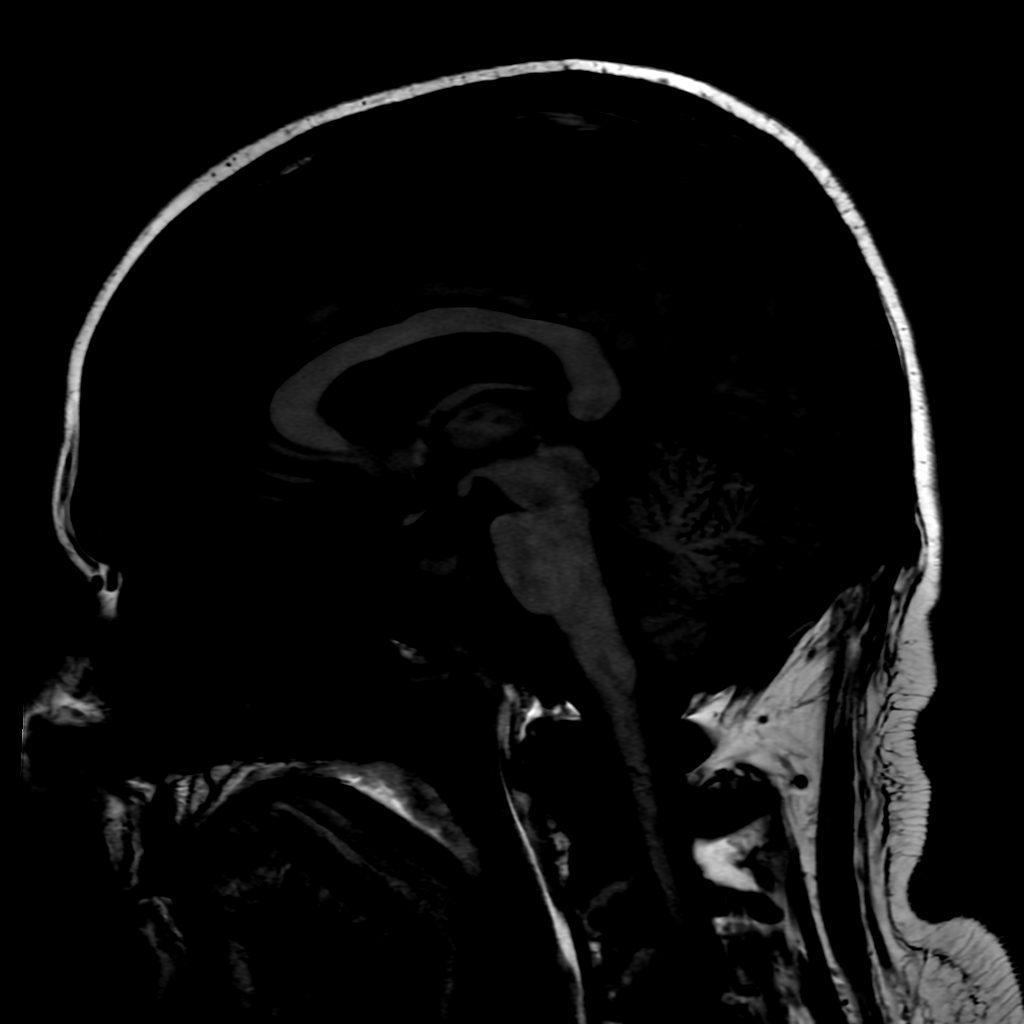
[im 44/44]
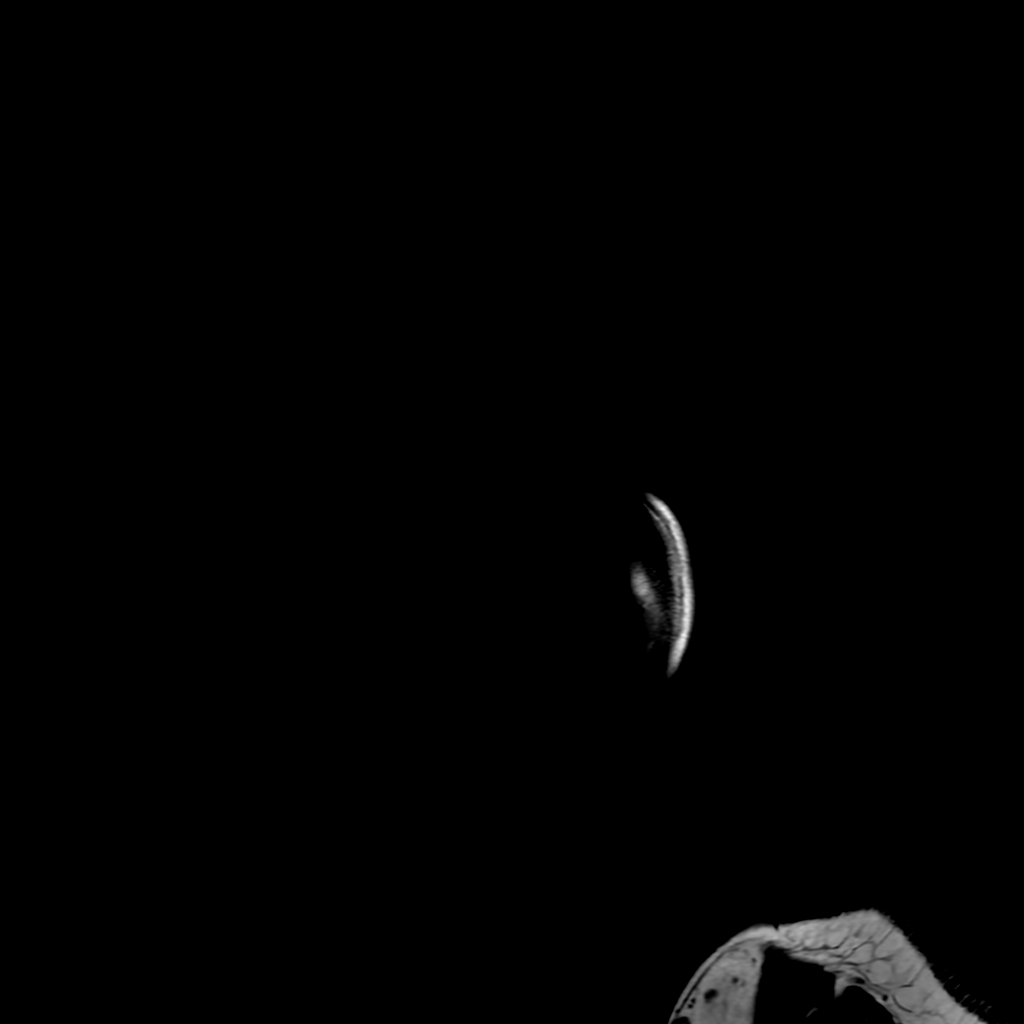

[Series 5: FLAIR · axial · 3.0mm · 0.47mm/px · z∈[-16,+140]mm · 3 of 53 slices shown (2 of 2)]
[im 1/53]
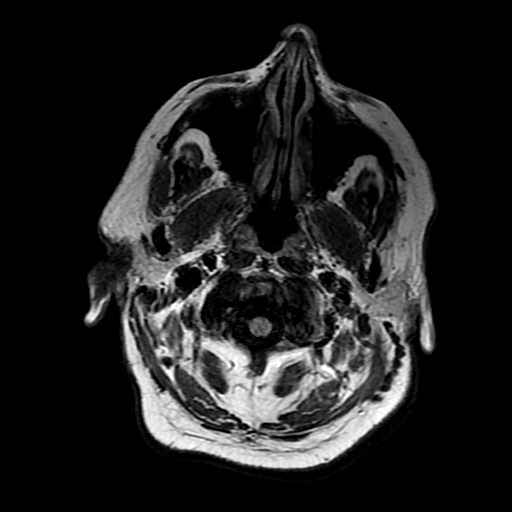
[im 27/53]
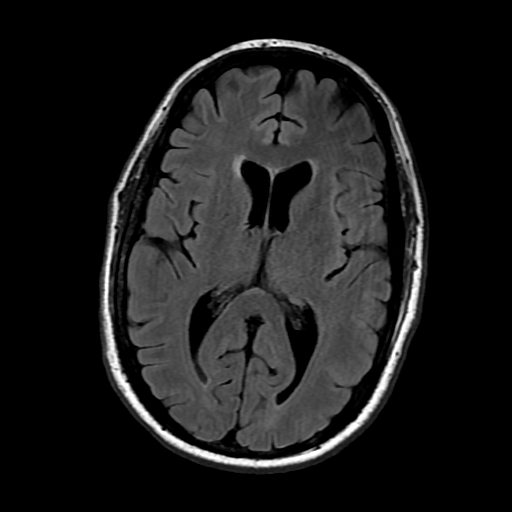
[im 53/53]
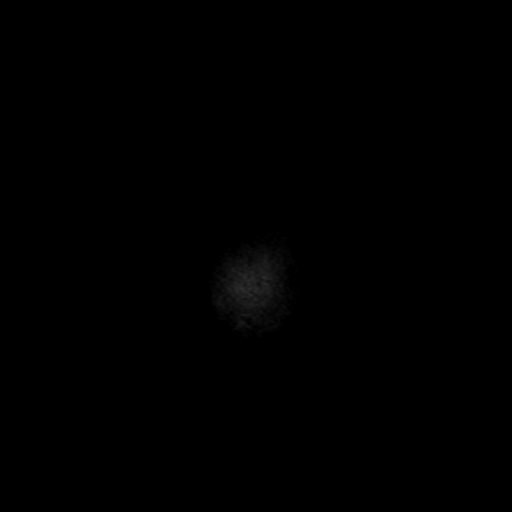

[Series 11: FLAIR post-contrast · sagittal · 3.0mm · 0.23mm/px · 1 of 44 slices shown]
[im 1/44]
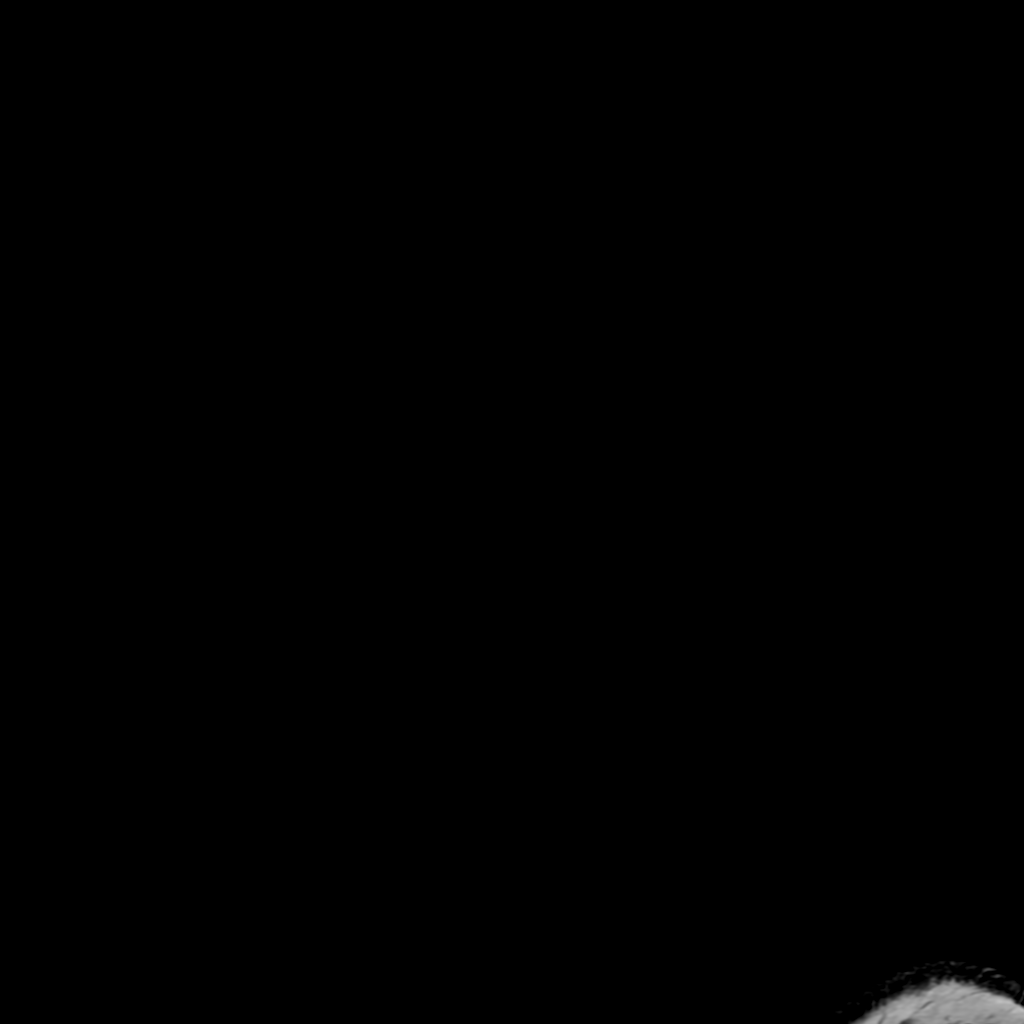

[7 of 48 positions shown; findings below may reference images not displayed]

FINDINGS: Brain: There is no acute infarction or intracranial hemorrhage.
There is no intracranial mass, mass effect, or edema. There is no
hydrocephalus or extra-axial fluid collection. Ventricles and sulci
are within normal limits in size and configuration. Minimal patchy
T2 hyperintensity in the supratentorial white matter is nonspecific
but may reflect minor chronic microvascular ischemic changes. Small
right parasagittal parietal developmental venous anomaly. No
pathologic enhancement identified.

Vascular: Major vessel flow voids at the skull base are preserved.

Skull and upper cervical spine: Abnormal T1 marrow signal with
heterogeneity likely reflecting diffuse osseous metastatic disease.
There is no evidence of significant extraosseous extension.

Sinuses/Orbits: Minor mucosal thickening including small right
maxillary sinus retention cysts or polyps. Orbits are unremarkable.

Other: Sella is unremarkable.  Mastoid air cells are clear.
IMPRESSION: No evidence of intracranial metastatic disease or other significant
abnormality.

Diffuse osseous metastatic disease.

## 2019-05-20 MED ORDER — LEVETIRACETAM 500 MG PO TABS: 500.0000 mg | ORAL_TABLET | Freq: Every day | ORAL | 0 refills | Status: DC

## 2019-05-20 MED ORDER — GADOBUTROL 1 MMOL/ML IV SOLN
10.0000 mL | Freq: Once | INTRAVENOUS | Status: AC | PRN
  Administered 2019-05-20: 10:00:00 10 mL via INTRAVENOUS

## 2019-05-20 MED FILL — levETIRAcetam 500 MG TABS: 500 | 14 days supply | Qty: 14 | Fill #0

## 2019-05-20 NOTE — Progress Notes (Signed)
[facility] [place], [place] [address] [phone]   New Patient Evaluation  Date of Service: 05/20/19 Patient Name: Jeffery [name] Patient MRN: [id number] Patient DOB: [birth year] Provider: Ventura Sellers, MD  Identifying Statement:  Jeffery [name] is a 59 y.o. male with seizure-like activity who presents for initial consultation and evaluation regarding cancer associated neurologic deficits.    Referring Provider: [name], MD [address] [place],  [place] [address]  Primary Cancer: Metastatic Prostate  Oncologic History: 05/06/19: Completes 10 day course of RT to C6-T2 vertebral bodies   History of Present Illness: The patient's records from the referring physician were obtained and reviewed and the patient interviewed to confirm this HPI.  Jeffery [name] presents to clinic today to discuss recent neurologic phenomena.  Jeffery [name] describes several weeks history of sudden onset right sided numbness with accompanying weakness; events begin in his hand and spread down his trunk into his right leg.  Each time Jeffery [name] has nearly fallen, and has been fortunate to have another person nearby. Event occurs over several minutes and resolves independently.  Jeffery [name] does not describe any post-event weakness, sensory loss, or confusion/drowsiness.  His wife feels Jeffery [name] was "out of it" after one of the episodes, but Jeffery [name] doesn't agree as Jeffery [name] clamed to be "just resting". There is no apparent positional provocation.  Frequency has been "several times per week" at least since completing radiation therapy on 1/21.    Medications: Current Outpatient Medications on File Prior to Visit  Medication Sig Dispense Refill  . Amino Acids-Protein Hydrolys (FEEDING SUPPLEMENT, PRO-STAT SUGAR FREE 64,) LIQD Take 30 mLs by mouth 3 (three) times daily. 887 mL 0  . Blood Glucose Monitoring Suppl (RELION CONFIRM GLUCOSE MONITOR) w/Device KIT Use 4  times daily before meals and bedtime as directed. 1 kit 0  . feeding supplement, ENSURE ENLIVE, (ENSURE ENLIVE) LIQD Take 237 mLs by mouth daily. 237 mL 12  . fentaNYL (DURAGESIC) 25 MCG/HR Place 1 patch onto the skin every 3 (three) days. 5 patch 0  . finasteride (PROSCAR) 5 MG tablet Take 5 mg by mouth daily.    . furosemide (LASIX) 40 MG tablet Take 40 mg by mouth daily.    [name] gabapentin (NEURONTIN) 300 MG capsule Take 1 capsule (300 mg total) by mouth 3 (three) times daily. (Patient taking differently: Take 300-600 mg by mouth See admin instructions. 373m in am and 6032min evening) 60 capsule 1  . glucose blood (RELION GLUCOSE TEST STRIPS) test strip Use as instructed 100 each 12  . HYDROcodone-acetaminophen (NORCO/VICODIN) 5-325 MG tablet TAKE 1 TO 2 TABLETS BY MOUTH EVERY 4 TO 6 HOURS AS NEEDED FOR PAIN (MAX OF 8 TABLETS IN 24 HOURS) 40 tablet 0  . HYDROmorphone (DILAUDID) 4 MG tablet Take 1 tablet (4 mg total) by mouth every 4 (four) hours as needed for severe pain. 60 tablet 0  . Lancets 30G MISC Use 4 times daily before meals as directed. 100 each 0  . metFORMIN (GLUCOPHAGE-XR) 500 MG 24 hr tablet Take 500 mg by mouth 2 (two) times daily.     . Multiple Vitamin (MULTIVITAMIN WITH MINERALS) TABS tablet Take 1 tablet by mouth daily.    . predniSONE (DELTASONE) 5 MG tablet Take 1 tablet (5 mg total) by mouth daily with breakfast. 90 tablet 3  . tamsulosin (FLOMAX) 0.4 MG CAPS capsule Take 2 capsules (0.8 mg total) by mouth daily after  supper.    . vitamin B-12 (CYANOCOBALAMIN) 1000 MCG tablet Take 1 tablet (1,000 mcg total) by mouth daily. 30 tablet 0  . ZYTIGA 250 MG tablet TAKE 4 TABLETS BY MOUTH ONCE DAILY AS DIRECTED.  TAKE 1 HOUR BEFORE OR 2 HOURS AFTER A MEAL (Patient taking differently: Take 1,000 mg by mouth daily. ) 120 tablet 10   Current Facility-Administered Medications on File Prior to Visit  Medication Dose Route Frequency Provider Last Rate Last Admin  . morphine 4 MG/ML  injection 2 mg  2 mg Intramuscular Once Bruning, Ashlyn, PA-C        Allergies: No Known Allergies Past Medical History:  Past Medical History:  Diagnosis Date  . Cancer ([place])    stage IV prostate cancer per patient  . Diabetes mellitus, new onset ([facility] [facility]) 07/2016  . Enlarged prostate   . HLD (hyperlipidemia)    Past Surgical History:  Past Surgical History:  Procedure Laterality Date  . I & D EXTREMITY Left 01/27/2019   Procedure: IRRIGATION AND DEBRIDEMENT EXTREMITY;  Surgeon: Iran Planas, MD;  Location: [facility];  Service: Orthopedics;  Laterality: Left;  . KNEE ARTHROSCOPY Right   . ORCHIECTOMY Bilateral 10/12/2018   Procedure: ORCHIECTOMY;  Surgeon: Lucas Mallow, MD;  Location: [facility];  Service: Urology;  Laterality: Bilateral;  . TRANSURETHRAL RESECTION OF PROSTATE  10/12/2018   Procedure: CYSTOSCOPY WITH URETHRAL DILATION;  Surgeon: Lucas Mallow, MD;  Location: [facility];  Service: Urology;;   Social History:  Social History   Socioeconomic History  . Marital status: Divorced    Spouse name: Not on file  . Number of children: 1  . Years of education: Not on file  . Highest education level: Not on file  Occupational History  . Occupation: Teaching laboratory technician  Tobacco Use  . Smoking status: Former Smoker    Packs/day: 1.50    Years: 42.00    Pack years: 63.00    Types: Cigarettes    Quit date: 2015    Years since quitting: 6.0  . Smokeless tobacco: Never Used  . Tobacco comment: 2015  Substance and Sexual Activity  . Alcohol use: No  . Drug use: No  . Sexual activity: Not Currently  Other Topics Concern  . 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   Quit smoking 2015, no alcohol no caffeine no tobacco or drug use   No health insurance   Social Determinants of Health   Financial Resource Strain:   . Difficulty of Paying Living Expenses: Not on file  Food  Insecurity:   . Worried About Charity fundraiser in the Last Year: Not on file  . Ran Out of Food in the Last Year: Not on file  Transportation Needs:   . Lack of Transportation (Medical): Not on file  . Lack of Transportation (Non-Medical): Not on file  Physical Activity:   . Days of Exercise per Week: Not on file  . Minutes of Exercise per Session: Not on file  Stress:   . Feeling of Stress : Not on file  Social Connections:   . Frequency of Communication with Friends and Family: Not on file  . Frequency of Social Gatherings with Friends and Family: Not on file  . Attends Religious Services: Not on file  . Active Member of Clubs or Organizations: Not on file  . Attends Club or  Organization Meetings: Not on file  . Marital Status: Not on file  Intimate Partner Violence:   . Fear of Current or Ex-Partner: Not on file  . Emotionally Abused: Not on file  . Physically Abused: Not on file  . Sexually Abused: Not on file   Family History:  Family History  Problem Relation Age of Onset  . Prostate cancer Father   . Heart disease Father   . Prostate cancer Paternal Grandfather     Review of Systems: Constitutional: Doesn't report fevers, chills or abnormal weight loss Eyes: Doesn't report blurriness of vision Ears, nose, mouth, throat, and face: Doesn't report sore throat Respiratory: Doesn't report cough, dyspnea or wheezes Cardiovascular: Doesn't report palpitation, chest discomfort  Gastrointestinal:  Doesn't report nausea, constipation, diarrhea GU: Doesn't report incontinence Skin: Doesn't report skin rashes Neurological: Per HPI Musculoskeletal: Doesn't report joint pain Behavioral/Psych: Doesn't report anxiety  Physical Exam: Vitals:   05/20/19 1231  BP: 117/70  Pulse: (!) 103  Resp: 18  Temp: 98 F (36.7 C)  SpO2: 97%   KPS: 70. General: Alert, cooperative, pleasant, in no acute distress Head: Normal EENT: No conjunctival injection or scleral icterus.    Lungs: Resp effort normal Cardiac: Regular rate Abdomen: Non-distended abdomen Skin: No rashes cyanosis or petechiae. Extremities: No clubbing or edema  Neurologic Exam: Mental Status: Awake, alert, attentive to examiner. Oriented to self and environment. Language is fluent with intact comprehension.  Cranial Nerves: Visual acuity is grossly normal. Visual fields are full. Extra-ocular movements intact. No ptosis. Face is symmetric Motor: Tone and bulk are normal. 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 Reflexes are symmetric, no pathologic reflexes present.  Sensory: Intact to light touch Gait: Deferred   Labs: I have reviewed the data as listed    Component Value Date/Time   NA 142 04/19/2019 0827   K 4.1 04/19/2019 0827   CL 101 04/19/2019 0827   CO2 27 04/19/2019 0827   GLUCOSE 206 (H) 04/19/2019 0827   BUN 33 (H) 04/19/2019 0827   CREATININE 2.16 (H) 04/19/2019 0827   CALCIUM 9.2 04/19/2019 0827   PROT 7.5 04/19/2019 0827   ALBUMIN 2.9 (L) 04/19/2019 0827   AST 13 (L) 04/19/2019 0827   ALT 15 04/19/2019 0827   ALKPHOS 284 (H) 04/19/2019 0827   BILITOT 0.3 04/19/2019 0827   GFRNONAA 33 (L) 04/19/2019 0827   GFRAA 38 (L) 04/19/2019 0827   Lab Results  Component Value Date   WBC 8.5 04/19/2019   NEUTROABS 6.5 04/19/2019   HGB 9.6 (L) 04/19/2019   HCT 31.5 (L) 04/19/2019   MCV 85.6 04/19/2019   PLT 322 04/19/2019    Imaging:  MR Brain W Wo Contrast  Result Date: 05/20/2019 CLINICAL DATA:  Prostate cancer, seizure activity EXAM: MRI HEAD WITHOUT AND WITH CONTRAST TECHNIQUE: Multiplanar, multiecho pulse sequences of the brain and surrounding structures were obtained without and with intravenous contrast. CONTRAST:  39m GADAVIST GADOBUTROL 1 MMOL/ML IV SOLN COMPARISON:  None FINDINGS: Brain: There is no acute infarction or intracranial hemorrhage. There is no intracranial mass, mass effect, or edema. There  is no hydrocephalus or extra-axial fluid collection. Ventricles and sulci are within normal limits in size and configuration. Minimal patchy T2 hyperintensity in the supratentorial white matter is nonspecific but may reflect minor chronic microvascular ischemic changes. Small right parasagittal parietal developmental venous anomaly. No pathologic enhancement identified. Vascular: Major vessel flow voids at the skull base are  preserved. Skull and upper cervical spine: Abnormal T1 marrow signal with heterogeneity likely reflecting diffuse osseous metastatic disease. There is no evidence of significant extraosseous extension. Sinuses/Orbits: Minor mucosal thickening including small right maxillary sinus retention cysts or polyps. Orbits are unremarkable. Other: Sella is unremarkable.  Mastoid air cells are clear. IMPRESSION: No evidence of intracranial metastatic disease or other significant abnormality. Diffuse osseous metastatic disease. Electronically Signed   By: Macy Mis M.D.   On: 05/20/2019 10:31     Assessment/Plan Seizure-like Activity  Jeffery [name] presents with a phenomenon localizing to CNS sensory and motor pathways, with unclear etiology.  His brain MRI does not demonstrate any abnormality.  Stereotypy of events is not consistent with vascular process such as TIA, and is more in line with epileptic phenomena.  However, there is no clear epilepsy risk factor, no post-ictal phase, and timing seems to coincide more with vertebral body radiation exposure.    These events could be epileptic, or alternate process such as myoclonus or paroxysmal asterixis, which could theoretically originate in the treated region.  Exam is noteworthy for subtle myoclonus and asterixis, both, likely from chronic renal impairment.  Because of frequency and overall morbidity of events, we recommended a trial of renal dose Keppra 558m daily, for 14 days.  Jeffery [name] will keep careful track of any change in frequency or  severity of the events.   We will call him after two weeks to review the clinical impact of this intervention.  Future workup could include EEG, repeat cervical spine MRI with contrast enhancement.     We spent twenty additional minutes teaching regarding the natural history, biology, and historical experience in the treatment of neurologic complications of cancer.   We appreciate the opportunity to participate in the care of Jeffery [name]   All questions were answered. The patient knows to call the clinic with any problems, questions or concerns. No barriers to learning were detected.  The total time spent in the encounter was 40 minutes and more than 50% was on counseling and review of test results   ZVentura Sellers MD Medical Director of Neuro-Oncology [facility]02/04/21 4:26 PM

## 2019-05-20 NOTE — Telephone Encounter (Signed)
Scheduled appt per 2/4 los.  Spoke with pt and he is aware of his scheduled appt date and time.  Per pt request called him again and also left a vm of the appt date and time.

## 2019-05-21 ENCOUNTER — Other Ambulatory Visit: Payer: Self-pay | Admitting: Oncology

## 2019-05-21 ENCOUNTER — Other Ambulatory Visit: Payer: Self-pay | Admitting: Radiation Therapy

## 2019-05-21 DIAGNOSIS — M7918 Myalgia, other site: Secondary | ICD-10-CM

## 2019-05-21 MED FILL — HYDROCODON-APAP 5-325: 5-325 | 5 days supply | Qty: 40 | Fill #0

## 2019-05-21 NOTE — Progress Notes (Signed)
Radiation Oncology         [phone]) [phone] ________________________________  Name: Jeffery [name] MRN: [id number]  Date: 05/20/2019  DOB: 1960-07-17  Outpatient Post-treatment follow up visit  CC: [name], MD  [name], *  Diagnosis:   59 y.o. 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  Interval Since Last Radiation:  2 weeks  1/7-20/21:  1.  The spine from C6-T2 was treated to 30 Gy in 10 fractions of 3 Gy 2.  The spine from L1-L3 was treated to 30 Gy in 10 fractions of 3 Gy  02/01/19 - 02/12/19:  The patient received 30 Gy in 10 fractions to the painful sites of metastatic disease in the right hip and right scapula.  Narrative:  The patient returns today for re-evaluation. In summary, this is a 59 y.o. gentleman who was diagnosed with Gleason 4+5 prostate cancer on 09/17/2018 with a PSA of 410. He was found to have findings suspicious for metastatic bladder cancer on staging CT A/P on 10/11/2018. He proceeded to bilateral orchiectomy for LT-ADT. He was also started on Zytiga on 11/16/2018 under Dr. Alen Blew. Restaging CT A/P on 12/23/2018 revealed improvement to adenopathy but progression of bone metastases. He was referred to [place] for consideration of palliative radiation to the painful sites of metastases in the right hip and right scapula, treated as outlined above. He continues on daily Zytiga/predinsone under the care and direction of Dr. Alen Blew.  During his recent radiation treatments, he developed nagging pain in his lower back which was persistent and worse with activity, limiting his quality of life.  We discussed treating the disease in the lumbar spine at that time but he wished to postpone further radiation treatment until 04/2019, after the holidays.  At the time of his routine 1 month follow up visit, he reported significant improvement in the right shoulder and right hip pain since completion of radiation.  He  also confirmed that the low back pain remained persistent but tolerable at that time and denied any other focal sites of pain or weakness.  He then presented to the ED on 04/15/2019 with progressive pain in the upper mid-back and left arm numbness. He underwent cervical and thoracic spine MRI at that time, which revealed widespread osseous metastatic disease throughout the thoracic spine with expansile disease of the posterior elements on the left at T1, with a small amount of extraosseous tumor including dorsal epidural tumor, but without cord compression, likely causing foraminal encroachment on the left at the C7-T1 level that could affect the left C8 nerve.disease, attributing to the numbness/weakness in the left forearm.  Additionally noted were acute pathologic fractures of T11 and T12 with minimal height loss. He was referred back to the radiation oncology clinic on 04/20/19 to discuss further palliative radiotherapy to the progressive sites of disease in the spine which he elected to proceed with.  He completed a 2 week course of palliative XRT to the cervical and lumbar spine on 05/05/19 which he tolerated well.   Interval History:  He reports resolution of the paraesthesias in the left upper and lower extremities since completing his radiation and his back pain is significantly improved. He continues with intermittent pain that moves around from site to site but denies any specific persistent focal pain.  His new complaint is of intermittent episodes of weakness in the right upper and lower extremities over the past 4-5 days which have caused him to fall on  at least one occasion recently, and nearly falling on multiple others.  He reports that he "loses control of the right side of my body" without warning, multiple times a day. He denies any bowel or bladder dysfunction, denies losing consciousness, difficulty with speech, headaches, blurry vision or seizure activity.  His mother had called the clinic on  05/19/19 stating that she was concerned that he was having seizures because she found him in his room, staring off into space and he was not responding to her when she spoke to him.  Today, he reports that he recalls that interaction and that he was "simply relaxing in his room after having a shower and just did not feel like being bothered".  On review of systems, the patient reports resolution of the severe pain and numbness in his left forearm and significant improvement in the mid-back pain at the level of C7-T1. [name] He also reports resolution of the stabbing pain in the lateral left hip and denies paraesthesias in the LLE or groin pain. Over the past 4-5 days, he has had intermittent episodes of weakness in the right upper and lower extremities which have caused him to fall on at least one occasion recently, and nearly falling on multiple others.  He reports that he "loses control of the right side of my body" without warning, multiple times a day. He denies any bowel or bladder dysfunction, denies losing consciousness, difficulty with speech, headaches, blurry vision or seizure activity. He continues with intermittent pain that moves around from site to site but denies any specific persistent focal pain. He is taking Vicodin prn to control the pain as he did NOT tolerate the Fentanyl patch.  He reports that the Fentanyl made him "go out of my head" and he did not like how he felt on it.  ALLERGIES:  has No Known Allergies.  Meds: Current Outpatient Medications  Medication Sig Dispense Refill  . Amino Acids-Protein Hydrolys (FEEDING SUPPLEMENT, PRO-STAT SUGAR FREE 64,) LIQD Take 30 mLs by mouth 3 (three) times daily. 887 mL 0  . Blood Glucose Monitoring Suppl (RELION CONFIRM GLUCOSE MONITOR) w/Device KIT Use 4 times daily before meals and bedtime as directed. 1 kit 0  . feeding supplement, ENSURE ENLIVE, (ENSURE ENLIVE) LIQD Take 237 mLs by mouth daily. 237 mL 12  . fentaNYL (DURAGESIC) 25 MCG/HR Place  1 patch onto the skin every 3 (three) days. 5 patch 0  . finasteride (PROSCAR) 5 MG tablet Take 5 mg by mouth daily.    . furosemide (LASIX) 40 MG tablet Take 40 mg by mouth daily.    [name] gabapentin (NEURONTIN) 300 MG capsule Take 1 capsule (300 mg total) by mouth 3 (three) times daily. (Patient taking differently: Take 300-600 mg by mouth See admin instructions. 328m in am and 606min evening) 60 capsule 1  . glucose blood (RELION GLUCOSE TEST STRIPS) test strip Use as instructed 100 each 12  . HYDROcodone-acetaminophen (NORCO/VICODIN) 5-325 MG tablet TAKE 1 TO 2 TABLETS BY MOUTH EVERY 4 TO 6 HOURS AS NEEDED FOR PAIN (MAX OF 8 TABLETS IN 24 HOURS) 40 tablet 0  . Lancets 30G MISC Use 4 times daily before meals as directed. 100 each 0  . metFORMIN (GLUCOPHAGE-XR) 500 MG 24 hr tablet Take 500 mg by mouth 2 (two) times daily.     . Multiple Vitamin (MULTIVITAMIN WITH MINERALS) TABS tablet Take 1 tablet by mouth daily.    . predniSONE (DELTASONE) 5 MG tablet Take 1 tablet (5  mg total) by mouth daily with breakfast. 90 tablet 3  . tamsulosin (FLOMAX) 0.4 MG CAPS capsule Take 2 capsules (0.8 mg total) by mouth daily after supper.    . vitamin B-12 (CYANOCOBALAMIN) 1000 MCG tablet Take 1 tablet (1,000 mcg total) by mouth daily. 30 tablet 0  . ZYTIGA 250 MG tablet TAKE 4 TABLETS BY MOUTH ONCE DAILY AS DIRECTED.  TAKE 1 HOUR BEFORE OR 2 HOURS AFTER A MEAL (Patient taking differently: Take 1,000 mg by mouth daily. ) 120 tablet 10  . HYDROmorphone (DILAUDID) 4 MG tablet Take 1 tablet (4 mg total) by mouth every 4 (four) hours as needed for severe pain. 60 tablet 0  . levETIRAcetam (KEPPRA) 500 MG tablet Take 1 tablet (500 mg total) by mouth daily. 14 tablet 0   Current Facility-Administered Medications  Medication Dose Route Frequency Provider Last Rate Last Admin  . morphine 4 MG/ML injection 2 mg  2 mg Intramuscular Once Evyn Kooyman, PA-C        Physical Findings:  weight is 232 lb (105.2 kg). His  temporal temperature is 98 F (36.7 C). His blood pressure is 107/68 and his pulse is 102 (abnormal). His oxygen saturation is 93%.  Pain Assessment Pain Score: 0-No pain/10 In general this is a well appearing Caucasian male in no acute distress. He's alert and oriented x4 and appropriate throughout the examination. EOMs are intact and PERRLA.  Cardiopulmonary assessment is negative for acute distress and he exhibits normal effort.  Sensation is intact to light touch in the upper and lower extremities bilaterally and strength is 4/5 and equal bilaterally. There is some mild, intermittent tremor in the right hand.  He has full ROM in the upper and lower extremities and appears grossly neurologically intact.     Lab Findings: Lab Results  Component Value Date   WBC 8.5 04/19/2019   HGB 9.6 (L) 04/19/2019   HCT 31.5 (L) 04/19/2019   MCV 85.6 04/19/2019   PLT 322 04/19/2019     Radiographic Findings: MR Brain W Wo Contrast  Result Date: 05/20/2019 CLINICAL DATA:  Prostate cancer, seizure activity EXAM: MRI HEAD WITHOUT AND WITH CONTRAST TECHNIQUE: Multiplanar, multiecho pulse sequences of the brain and surrounding structures were obtained without and with intravenous contrast. CONTRAST:  73m GADAVIST GADOBUTROL 1 MMOL/ML IV SOLN COMPARISON:  None FINDINGS: Brain: There is no acute infarction or intracranial hemorrhage. There is no intracranial mass, mass effect, or edema. There is no hydrocephalus or extra-axial fluid collection. Ventricles and sulci are within normal limits in size and configuration. Minimal patchy T2 hyperintensity in the supratentorial white matter is nonspecific but may reflect minor chronic microvascular ischemic changes. Small right parasagittal parietal developmental venous anomaly. No pathologic enhancement identified. Vascular: Major vessel flow voids at the skull base are preserved. Skull and upper cervical spine: Abnormal T1 marrow signal with heterogeneity likely  reflecting diffuse osseous metastatic disease. There is no evidence of significant extraosseous extension. Sinuses/Orbits: Minor mucosal thickening including small right maxillary sinus retention cysts or polyps. Orbits are unremarkable. Other: Sella is unremarkable.  Mastoid air cells are clear. IMPRESSION: No evidence of intracranial metastatic disease or other significant abnormality. Diffuse osseous metastatic disease. Electronically Signed   By: PMacy MisM.D.   On: 05/20/2019 10:31    Impression/Plan: 1. 59y.o. gentleman with advanced stage IV prostate cancer with painful spinal metastases in the thoracic and lumbar spine. He has known widespread osseous metastatic disease which has responded well to palliative radiation, currently  with satisfactory pain control.  He had an MRI brain on 05/20/19 for further evaluation of the new right sided weakness and paraesthesias and fortunately, this did not show any evidence of intracranial metastatic disease or other significant abnormalities.  He is scheduled for a consult visit with Dr. Mickeal Skinner, following our visit today to get his opinion and recommendations regarding further evaluation and/or management of the new neurologic symptoms. We will remain in close communication and look forward to his input and guidance which is much appreciated.  At this point, we will plan to see the patient back on an as needed basis but are more than happy to continue to participate in his care if clinically indicated in the future.   He is s/p bilateral orchiectomy on 10/12/18 for permanent ADT.  He continues to tolerate the Zytiga well. Recent PSA was 19.1, which is increased from 11.4 in 02/2019.  The current plan is to continue on daily Zytiga/predinsone under the care and direction of Dr. Alen Blew and he will see him back for follow up on 05/31/19 for continued systemic disease management.      Nicholos Johns, PA-C    Tyler Pita, MD  [facility] Direct Dial: [phone]  Fax: [phone] [email or web address]  Skype  LinkedIn

## 2019-05-25 ENCOUNTER — Telehealth: Payer: Self-pay | Admitting: *Deleted

## 2019-05-25 NOTE — Telephone Encounter (Signed)
Patient called with follow up post visit with Dr. Mickeal Skinner when he prescribed Keppra 500 mg daily.  Patient and his mother state that he took the dose Friday and Saturday of last week and while on the medication he was almost incoherent and unable to complete sentences.    He states that being on the medication was "really wild, weird" and he felt like he was "in a capsule".  Patient currently is having incomplete thoughts and reports having periods of being alert and able to walk slowly but when he gets to the bathroom he will remain in the bathroom for 3-4 hours without any desire to move or get up.  Per Dr. Mickeal Skinner, Patient to be evaluated in office, appt scheduled.  Mother who was a good historian was requested to attend the appointment if at all possible.

## 2019-05-27 ENCOUNTER — Other Ambulatory Visit: Payer: Self-pay

## 2019-05-27 ENCOUNTER — Telehealth: Payer: Self-pay

## 2019-05-27 ENCOUNTER — Telehealth: Payer: Self-pay | Admitting: Internal Medicine

## 2019-05-27 ENCOUNTER — Encounter: Payer: Self-pay | Admitting: *Deleted

## 2019-05-27 ENCOUNTER — Inpatient Hospital Stay: Payer: Medicaid Other

## 2019-05-27 ENCOUNTER — Inpatient Hospital Stay (HOSPITAL_BASED_OUTPATIENT_CLINIC_OR_DEPARTMENT_OTHER): Payer: Medicaid Other | Admitting: Internal Medicine

## 2019-05-27 VITALS — BP 92/60 | HR 118 | Temp 97.8°F | Resp 16 | Ht 69.0 in | Wt 231.0 lb

## 2019-05-27 DIAGNOSIS — R4182 Altered mental status, unspecified: Secondary | ICD-10-CM | POA: Diagnosis not present

## 2019-05-27 DIAGNOSIS — C61 Malignant neoplasm of prostate: Secondary | ICD-10-CM | POA: Diagnosis not present

## 2019-05-27 DIAGNOSIS — R569 Unspecified convulsions: Secondary | ICD-10-CM

## 2019-05-27 DIAGNOSIS — R278 Other lack of coordination: Secondary | ICD-10-CM | POA: Diagnosis not present

## 2019-05-27 LAB — URINALYSIS, COMPLETE (UACMP) WITH MICROSCOPIC
Bilirubin Urine: NEGATIVE
Glucose, UA: NEGATIVE mg/dL
Ketones, ur: NEGATIVE mg/dL
Nitrite: NEGATIVE
Protein, ur: 30 mg/dL — AB
Specific Gravity, Urine: 1.009 (ref 1.005–1.030)
pH: 5 (ref 5.0–8.0)

## 2019-05-27 LAB — CBC WITH DIFFERENTIAL (CANCER CENTER ONLY)
Abs Immature Granulocytes: 0.05 10*3/uL (ref 0.00–0.07)
Basophils Absolute: 0 10*3/uL (ref 0.0–0.1)
Basophils Relative: 0 %
Eosinophils Absolute: 0 10*3/uL (ref 0.0–0.5)
Eosinophils Relative: 1 %
HCT: 22.5 % — ABNORMAL LOW (ref 39.0–52.0)
Hemoglobin: 6.6 g/dL — CL (ref 13.0–17.0)
Immature Granulocytes: 1 %
Lymphocytes Relative: 5 %
Lymphs Abs: 0.4 10*3/uL — ABNORMAL LOW (ref 0.7–4.0)
MCH: 24.7 pg — ABNORMAL LOW (ref 26.0–34.0)
MCHC: 29.3 g/dL — ABNORMAL LOW (ref 30.0–36.0)
MCV: 84.3 fL (ref 80.0–100.0)
Monocytes Absolute: 0.5 10*3/uL (ref 0.1–1.0)
Monocytes Relative: 7 %
Neutro Abs: 6.3 10*3/uL (ref 1.7–7.7)
Neutrophils Relative %: 86 %
Platelet Count: 280 10*3/uL (ref 150–400)
RBC: 2.67 MIL/uL — ABNORMAL LOW (ref 4.22–5.81)
RDW: 18.8 % — ABNORMAL HIGH (ref 11.5–15.5)
WBC Count: 7.3 10*3/uL (ref 4.0–10.5)
nRBC: 0 % (ref 0.0–0.2)

## 2019-05-27 LAB — CMP (CANCER CENTER ONLY)
ALT: 41 U/L (ref 0–44)
AST: 62 U/L — ABNORMAL HIGH (ref 15–41)
Albumin: 2 g/dL — ABNORMAL LOW (ref 3.5–5.0)
Alkaline Phosphatase: 262 U/L — ABNORMAL HIGH (ref 38–126)
Anion gap: 13 (ref 5–15)
BUN: 38 mg/dL — ABNORMAL HIGH (ref 6–20)
CO2: 33 mmol/L — ABNORMAL HIGH (ref 22–32)
Calcium: 8.8 mg/dL — ABNORMAL LOW (ref 8.9–10.3)
Chloride: 92 mmol/L — ABNORMAL LOW (ref 98–111)
Creatinine: 2.38 mg/dL — ABNORMAL HIGH (ref 0.61–1.24)
GFR, Est AFR Am: 34 mL/min — ABNORMAL LOW (ref >60)
GFR, Estimated: 29 mL/min — ABNORMAL LOW (ref >60)
Glucose, Bld: 248 mg/dL — ABNORMAL HIGH (ref 70–99)
Potassium: 3.5 mmol/L (ref 3.5–5.1)
Sodium: 138 mmol/L (ref 135–145)
Total Bilirubin: 0.5 mg/dL (ref 0.3–1.2)
Total Protein: 7 g/dL (ref 6.5–8.1)

## 2019-05-27 LAB — VITAMIN D 25 HYDROXY (VIT D DEFICIENCY, FRACTURES): Vit D, 25-Hydroxy: 36.92 ng/mL (ref 30–100)

## 2019-05-27 LAB — AMMONIA: Ammonia: 19 umol/L (ref 9–35)

## 2019-05-27 LAB — TSH: TSH: 1.983 u[IU]/mL (ref 0.320–4.118)

## 2019-05-27 LAB — VITAMIN B12: Vitamin B-12: 547 pg/mL (ref 180–914)

## 2019-05-27 NOTE — Telephone Encounter (Signed)
Scheduled appt per 2/11 los  Spoke with pt and he is aware of the appt date and time.

## 2019-05-27 NOTE — Progress Notes (Signed)
CRITICAL VALUE ALERT  Critical Value:  Hgb 6.6  Date & Time Notied:  05/27/2019 at 1315  Provider Notified: Seth Bake, RN with Dr. Alen Blew  Orders Received/Actions taken: Results verbally given to desk nurse for MD

## 2019-05-27 NOTE — Telephone Encounter (Signed)
Received critical Hgb 6.6 and Dr. Alen Blew made aware. Contacted patient and left a message that he will be receiving a call from the scheduler with date and times of lab and infusion appts to receive blood. High priority scheduling message sent for lab and infusion appt. Left contact information for patient if he has any questions or concerns.

## 2019-05-27 NOTE — Progress Notes (Signed)
[facility] [place], [place] [address] [phone]   Interval Evaluation  Date of Service: 05/27/19 Patient Name: Jeffery [name] Patient MRN: [id number] Patient DOB: November 20, 1960 Provider: Ventura Sellers, MD  Identifying Statement:  Jeffery [name] is a 59 y.o. male with seizure-like activity  Primary Cancer: Metastatic Prostate  Oncologic History: 05/06/19: Completes 10 day course of RT to C6-T2 vertebral bodies   Interval History:  Jeffery [name] presents today for follow up due to clinical changes.  He describes significant decline in cognition, leading to confusion, lethargy, inattention.  His wife feels these symptoms really worsened over the weekend after starting the Keppra trial.  He stopped the Keppra after the second dose on Sunday (4 days ago).  He has improved since that time but still not back to his baseline.  He and his wife feel that his overall "shakiness" is worsened as well, but there has been no recurrence of paroxysmal right sided dyfunction since initiating the anti-epileptic.  H+P (05/20/19) Patient presents to clinic today to discuss recent neurologic phenomena.  He describes several weeks history of sudden onset right sided numbness with accompanying weakness; events begin in his hand and spread down his trunk into his right leg.  Each time he has nearly fallen, and has been fortunate to have another person nearby. Event occurs over several minutes and resolves independently.  He does not describe any post-event weakness, sensory loss, or confusion/drowsiness.  His wife feels he was "out of it" after one of the episodes, but he doesn't agree as he clamed to be "just resting". There is no apparent positional provocation.  Frequency has been "several times per week" at least since completing radiation therapy on 1/21.    Medications: Current Outpatient Medications on File Prior to Visit  Medication  Sig Dispense Refill  . Amino Acids-Protein Hydrolys (FEEDING SUPPLEMENT, PRO-STAT SUGAR FREE 64,) LIQD Take 30 mLs by mouth 3 (three) times daily. 887 mL 0  . Blood Glucose Monitoring Suppl (RELION CONFIRM GLUCOSE MONITOR) w/Device KIT Use 4 times daily before meals and bedtime as directed. 1 kit 0  . feeding supplement, ENSURE ENLIVE, (ENSURE ENLIVE) LIQD Take 237 mLs by mouth daily. 237 mL 12  . fentaNYL (DURAGESIC) 25 MCG/HR Place 1 patch onto the skin every 3 (three) days. 5 patch 0  . finasteride (PROSCAR) 5 MG tablet Take 5 mg by mouth daily.    . furosemide (LASIX) 40 MG tablet Take 40 mg by mouth daily.    [name] gabapentin (NEURONTIN) 300 MG capsule Take 1 capsule (300 mg total) by mouth 3 (three) times daily. (Patient taking differently: Take 300-600 mg by mouth See admin instructions. 317m in am and 6054min evening) 60 capsule 1  . glucose blood (RELION GLUCOSE TEST STRIPS) test strip Use as instructed 100 each 12  . HYDROcodone-acetaminophen (NORCO/VICODIN) 5-325 MG tablet TAKE 1 TO 2 TABLETS BY MOUTH EVERY 4 TO 6 AS NEEDED FOR PAIN (MAX 8 TABLETS IN 24 HOURS) 40 tablet 0  . HYDROmorphone (DILAUDID) 4 MG tablet Take 1 tablet (4 mg total) by mouth every 4 (four) hours as needed for severe pain. 60 tablet 0  . Lancets 30G MISC Use 4 times daily before meals as directed. 100 each 0  . levETIRAcetam (KEPPRA) 500 MG tablet Take 1 tablet (500 mg total) by mouth daily. 14 tablet 0  . metFORMIN (GLUCOPHAGE-XR) 500 MG 24 hr tablet Take 500 mg by  mouth 2 (two) times daily.     . Multiple Vitamin (MULTIVITAMIN WITH MINERALS) TABS tablet Take 1 tablet by mouth daily.    . predniSONE (DELTASONE) 5 MG tablet Take 1 tablet (5 mg total) by mouth daily with breakfast. 90 tablet 3  . tamsulosin (FLOMAX) 0.4 MG CAPS capsule Take 2 capsules (0.8 mg total) by mouth daily after supper.    . vitamin B-12 (CYANOCOBALAMIN) 1000 MCG tablet Take 1 tablet (1,000 mcg total) by mouth daily. 30 tablet 0  . ZYTIGA 250 MG  tablet TAKE 4 TABLETS BY MOUTH ONCE DAILY AS DIRECTED.  TAKE 1 HOUR BEFORE OR 2 HOURS AFTER A MEAL (Patient taking differently: Take 1,000 mg by mouth daily. ) 120 tablet 10   Current Facility-Administered Medications on File Prior to Visit  Medication Dose Route Frequency Provider Last Rate Last Admin  . morphine 4 MG/ML injection 2 mg  2 mg Intramuscular Once Bruning, Ashlyn, PA-C        Allergies: No Known Allergies Past Medical History:  Past Medical History:  Diagnosis Date  . Cancer ([place])    stage IV prostate cancer per patient  . Diabetes mellitus, new onset ([place]) 07/2016  . Enlarged prostate   . HLD (hyperlipidemia)    Past Surgical History:  Past Surgical History:  Procedure Laterality Date  . I & D EXTREMITY Left 01/27/2019   Procedure: IRRIGATION AND DEBRIDEMENT EXTREMITY;  Surgeon: Iran Planas, MD;  Location: [facility];  Service: Orthopedics;  Laterality: Left;  . KNEE ARTHROSCOPY Right   . ORCHIECTOMY Bilateral 10/12/2018   Procedure: ORCHIECTOMY;  Surgeon: Lucas Mallow, MD;  Location: [facility];  Service: Urology;  Laterality: Bilateral;  . TRANSURETHRAL RESECTION OF PROSTATE  10/12/2018   Procedure: CYSTOSCOPY WITH URETHRAL DILATION;  Surgeon: Lucas Mallow, MD;  Location: [facility];  Service: Urology;;   Social History:  Social History   Socioeconomic History  . Marital status: Divorced    Spouse name: Not on file  . Number of children: 1  . Years of education: Not on file  . Highest education level: Not on file  Occupational History  . Occupation: Teaching laboratory technician  Tobacco Use  . Smoking status: Former Smoker    Packs/day: 1.50    Years: 42.00    Pack years: 63.00    Types: Cigarettes    Quit date: 2015    Years since quitting: 6.1  . Smokeless tobacco: Never Used  . Tobacco comment: 2015  Substance and Sexual Activity  . Alcohol use: No  . Drug use: No  . Sexual activity: Not Currently  Other Topics Concern  . Not on file  Social  History Narrative   The patient is separated he has 1 son, he is a Academic librarian.   Quit smoking 2015, no alcohol no caffeine no tobacco or drug use   No health insurance   Social Determinants of Health   Financial Resource Strain:   . Difficulty of Paying Living Expenses: Not on file  Food Insecurity:   . Worried About Charity fundraiser in the Last Year: Not on file  . Ran Out of Food in the Last Year: Not on file  Transportation Needs:   . Lack of Transportation (Medical): Not on file  . Lack of Transportation (Non-Medical): Not on file  Physical Activity:   . Days of Exercise per Week: Not on file  . Minutes of Exercise per Session: Not on file  Stress:   . Feeling of Stress : Not on file  Social Connections:   . Frequency of Communication with Friends and Family: Not on file  . Frequency of Social Gatherings with Friends and Family: Not on file  . Attends Religious Services: Not on file  . Active Member of Clubs or Organizations: Not on file  . Attends Archivist Meetings: Not on file  . Marital Status: Not on file  Intimate Partner Violence:   . Fear of Current or Ex-Partner: Not on file  . Emotionally Abused: Not on file  . Physically Abused: Not on file  . Sexually Abused: Not on file   Family History:  Family History  Problem Relation Age of Onset  . Prostate cancer Father   . Heart disease Father   . Prostate cancer Paternal Grandfather     Review of Systems: Constitutional: Doesn't report fevers, chills or abnormal weight loss Eyes: Doesn't report blurriness of vision Ears, nose, mouth, throat, and face: Doesn't report sore throat Respiratory: Doesn't report cough, dyspnea or wheezes Cardiovascular: Doesn't report palpitation, chest discomfort  Gastrointestinal:  Doesn't report nausea, constipation, diarrhea GU: Doesn't report incontinence Skin: Doesn't report skin rashes Neurological: Per HPI Musculoskeletal:  Doesn't report joint pain Behavioral/Psych: Doesn't report anxiety  Physical Exam: Vitals:   05/27/19 1156  BP: 92/60  Pulse: (!) 118  Resp: 16  Temp: 97.8 F (36.6 C)  SpO2: 90%   KPS: 70. General: Alert, cooperative, pleasant, in no acute distress Head: Normal EENT: No conjunctival injection or scleral icterus.  Lungs: Resp effort normal Cardiac: Regular rate Abdomen: Non-distended abdomen Skin: No rashes cyanosis or petechiae. Extremities: No clubbing or edema  Neurologic Exam: Mental Status: Awake, alert, attentive to examiner. Oriented to self and environment. Language is fluent with intact comprehension.  Age advanced psychomotor slowing, waxing/waning clarity of thought pattern Cranial Nerves: Visual acuity is grossly normal. Visual fields are full. Extra-ocular movements intact. No ptosis. Face is symmetric Motor: Tone and bulk are normal. 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 Reflexes are symmetric, no pathologic reflexes present.  Sensory: Intact to light touch Gait: Deferred   Labs: I have reviewed the data as listed    Component Value Date/Time   NA 138 05/27/2019 1254   K 3.5 05/27/2019 1254   CL 92 (L) 05/27/2019 1254   CO2 33 (H) 05/27/2019 1254   GLUCOSE 248 (H) 05/27/2019 1254   BUN 38 (H) 05/27/2019 1254   CREATININE 2.38 (H) 05/27/2019 1254   CALCIUM 8.8 (L) 05/27/2019 1254   PROT 7.0 05/27/2019 1254   ALBUMIN 2.0 (L) 05/27/2019 1254   AST 62 (H) 05/27/2019 1254   ALT 41 05/27/2019 1254   ALKPHOS 262 (H) 05/27/2019 1254   BILITOT 0.5 05/27/2019 1254   GFRNONAA 29 (L) 05/27/2019 1254   GFRAA 34 (L) 05/27/2019 1254   Lab Results  Component Value Date   WBC 7.3 05/27/2019   NEUTROABS 6.3 05/27/2019   HGB 6.6 (LL) 05/27/2019   HCT 22.5 (L) 05/27/2019   MCV 84.3 05/27/2019   PLT 280 05/27/2019    Imaging:  MR Brain W Wo Contrast  Result Date: 05/20/2019 CLINICAL DATA:   Prostate cancer, seizure activity EXAM: MRI HEAD WITHOUT AND WITH CONTRAST TECHNIQUE: Multiplanar, multiecho pulse sequences of the brain and surrounding structures were obtained without and with intravenous contrast. CONTRAST:  80m GADAVIST GADOBUTROL 1 MMOL/ML IV SOLN COMPARISON:  None FINDINGS: Brain: There  is no acute infarction or intracranial hemorrhage. There is no intracranial mass, mass effect, or edema. There is no hydrocephalus or extra-axial fluid collection. Ventricles and sulci are within normal limits in size and configuration. Minimal patchy T2 hyperintensity in the supratentorial white matter is nonspecific but may reflect minor chronic microvascular ischemic changes. Small right parasagittal parietal developmental venous anomaly. No pathologic enhancement identified. Vascular: Major vessel flow voids at the skull base are preserved. Skull and upper cervical spine: Abnormal T1 marrow signal with heterogeneity likely reflecting diffuse osseous metastatic disease. There is no evidence of significant extraosseous extension. Sinuses/Orbits: Minor mucosal thickening including small right maxillary sinus retention cysts or polyps. Orbits are unremarkable. Other: Sella is unremarkable.  Mastoid air cells are clear. IMPRESSION: No evidence of intracranial metastatic disease or other significant abnormality. Diffuse osseous metastatic disease. Electronically Signed   By: Macy Mis M.D.   On: 05/20/2019 10:31     Assessment/Plan Seizure-like Activity  Jeffery [name] presents with what appears to be metabolic encephalopathy today, based on subjective and objective findings.  He remains non-focal aside from paryoxysmal episodes, which have actually not recurred in the past week.    We recommended a full metabolic workup, including CBC w/ diff, CMP, ammonia, UA w/ micro, TSH, Vit B12.  Additionally will check Keppra level and also serum copper given presence of myoclonus/asterixis and liver enzyme  abnormalities.  We would also like him to obtain a routine EEG study in the next week for further evaluation of paroxysmal events.  Cessation with modest AED therapy increases likelihood of epileptic etiology.  He should return to clinic in 2 weeks to review testing, or sooner as needed.  If decline propagates over the next few days he might need to be admitted for acute confusion.  We appreciate the opportunity to participate in the care of Jeffery [name].   All questions were answered. The patient knows to call the clinic with any problems, questions or concerns. No barriers to learning were detected.  The total time spent in the encounter was 40 minutes and more than 50% was on counseling and review of test results   Ventura Sellers, MD Medical Director of Neuro-Oncology [facility] at [place] 05/27/19 11:45 AM

## 2019-05-28 ENCOUNTER — Inpatient Hospital Stay: Payer: Medicaid Other

## 2019-05-28 ENCOUNTER — Other Ambulatory Visit: Payer: Medicaid Other

## 2019-05-28 DIAGNOSIS — C61 Malignant neoplasm of prostate: Secondary | ICD-10-CM

## 2019-05-28 LAB — PREPARE RBC (CROSSMATCH)

## 2019-05-28 LAB — PROSTATE-SPECIFIC AG, SERUM (LABCORP): Prostate Specific Ag, Serum: 0.2 ng/mL (ref 0.0–4.0)

## 2019-05-28 LAB — ABO/RH: ABO/RH(D): O POS

## 2019-05-28 MED ORDER — DIPHENHYDRAMINE HCL 25 MG PO CAPS
25.0000 mg | ORAL_CAPSULE | Freq: Once | ORAL | Status: AC
  Administered 2019-05-28: 13:00:00 25 mg via ORAL

## 2019-05-28 MED ORDER — ACETAMINOPHEN 325 MG PO TABS
650.0000 mg | ORAL_TABLET | Freq: Once | ORAL | Status: AC
  Administered 2019-05-28: 13:00:00 650 mg via ORAL

## 2019-05-28 MED ORDER — SODIUM CHLORIDE 0.9% IV SOLUTION
250.0000 mL | Freq: Once | INTRAVENOUS | Status: AC
  Administered 2019-05-28: 13:00:00 250 mL via INTRAVENOUS
  Filled 2019-05-28: qty 250

## 2019-05-28 MED FILL — GABAPENTIN 300 MG CAPSULE: 300 | 30 days supply | Qty: 150 | Fill #0

## 2019-05-28 NOTE — Patient Instructions (Signed)

## 2019-05-30 LAB — TYPE AND SCREEN
ABO/RH(D): O POS
Antibody Screen: NEGATIVE
Unit division: 0
Unit division: 0

## 2019-05-30 LAB — BPAM RBC
Blood Product Expiration Date: 202103172359
Blood Product Expiration Date: 202103172359
ISSUE DATE / TIME: 202102121334
ISSUE DATE / TIME: 202102121334
Unit Type and Rh: 5100
Unit Type and Rh: 5100

## 2019-05-30 LAB — COPPER, SERUM: Copper: 241 ug/dL — ABNORMAL HIGH (ref 69–132)

## 2019-05-31 ENCOUNTER — Other Ambulatory Visit: Payer: Self-pay | Admitting: Oncology

## 2019-05-31 ENCOUNTER — Inpatient Hospital Stay (HOSPITAL_BASED_OUTPATIENT_CLINIC_OR_DEPARTMENT_OTHER): Payer: Medicaid Other | Admitting: Oncology

## 2019-05-31 ENCOUNTER — Telehealth: Payer: Self-pay | Admitting: *Deleted

## 2019-05-31 ENCOUNTER — Other Ambulatory Visit: Payer: Self-pay

## 2019-05-31 ENCOUNTER — Telehealth: Payer: Self-pay

## 2019-05-31 VITALS — BP 90/59 | HR 110 | Temp 97.8°F | Resp 17 | Ht 69.0 in | Wt 234.5 lb

## 2019-05-31 DIAGNOSIS — M7918 Myalgia, other site: Secondary | ICD-10-CM

## 2019-05-31 DIAGNOSIS — C61 Malignant neoplasm of prostate: Secondary | ICD-10-CM | POA: Diagnosis not present

## 2019-05-31 DIAGNOSIS — D649 Anemia, unspecified: Secondary | ICD-10-CM

## 2019-05-31 DIAGNOSIS — G629 Polyneuropathy, unspecified: Secondary | ICD-10-CM | POA: Diagnosis not present

## 2019-05-31 MED ORDER — GABAPENTIN 300 MG PO CAPS: 300.0000 mg | ORAL_CAPSULE | Freq: Three times a day (TID) | ORAL | 3 refills | Status: AC

## 2019-05-31 MED ORDER — HYDROCODONE-ACETAMINOPHEN 5-325 MG PO TABS: ORAL_TABLET | ORAL | 0 refills | Status: DC

## 2019-05-31 MED FILL — HYDROCODON-APAP 5-325: 5-325 | 11 days supply | Qty: 90 | Fill #0

## 2019-05-31 NOTE — Telephone Encounter (Signed)
Received call from [facility] requesting clarification on the Gabapentin directions. Contacted [facility] outpt pharmacy and clarified directions are 1-2 tabs TID per Dr. Alen Blew. Also contacted patient [id number] up call and patient stated that he also requested refill of the Norco. Explained that will make Dr. Alen Blew aware of the refill request. Made patient aware that the [facility] PT referral has been sent to 2 different agencies and pending status. Patient verbalized understanding and had no other questions or concerns.

## 2019-05-31 NOTE — Progress Notes (Signed)
Contacted Karen RN with [facility] and made aware that they are unable to open the case. Faxed [facility] PT referral to [facility] and [facility]. Awaiting status of referral.

## 2019-05-31 NOTE — Progress Notes (Signed)
Hematology and Oncology Follow Up Visit  Jeffery [name] [id number] 02-Feb-1961 59 y.o. 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   Prior Therapy: He is status post biopsy obtained on 09/17/2018 for his prostate performed by Dr. Gloriann Loan.  This showed a Gleason score of 9 and 12 cores.  He is status post orchiectomy completed on October 12, 2018.  He is status post radiation to the hip and right scapula completed in October 2020.  He received 30 Gy in 10 fractions at that time.  He status post radiation therapy to the cervical spine as well as L1-L3.  He completed 30 Gy in 10 fractions for both sites between January 7 and May 05, 2019.  Current therapy:   Zytiga 1000 mg daily started in August 2020.     Interim History: Jeffery [name] is here for return evaluation.  Since the last visit, he completed radiation therapy without any major complications.  He reports his pain is better but still has neuropathic component to his pain.  He is currently on hydrocodone and gabapentin which has improved overall his pain.  He is still more debilitated and weak.  He is relying on his mother for activities of daily living.  Does walk short distances and has not reported any falls or syncope.          Medications: Reviewed without changes. Current Outpatient Medications  Medication Sig Dispense Refill  . Amino Acids-Protein Hydrolys (FEEDING SUPPLEMENT, PRO-STAT SUGAR FREE 64,) LIQD Take 30 mLs by mouth 3 (three) times daily. 887 mL 0  . Blood Glucose Monitoring Suppl (RELION CONFIRM GLUCOSE MONITOR) w/Device KIT Use 4 times daily before meals and bedtime as directed. 1 kit 0  . feeding supplement, ENSURE ENLIVE, (ENSURE ENLIVE) LIQD Take 237 mLs by mouth daily. 237 mL 12  . finasteride  (PROSCAR) 5 MG tablet Take 5 mg by mouth daily.    . furosemide (LASIX) 40 MG tablet Take 40 mg by mouth daily.    [name] gabapentin (NEURONTIN) 300 MG capsule Take 1 capsule (300 mg total) by mouth 3 (three) times daily. (Patient taking differently: Take 300-600 mg by mouth See admin instructions. '300mg'$  in am and '600mg'$  in evening) 60 capsule 1  . glucose blood (RELION GLUCOSE TEST STRIPS) test strip Use as instructed 100 each 12  . HYDROcodone-acetaminophen (NORCO/VICODIN) 5-325 MG tablet TAKE 1 TO 2 TABLETS BY MOUTH EVERY 4 TO 6 AS NEEDED FOR PAIN (MAX 8 TABLETS IN 24 HOURS) 40 tablet 0  . Lancets 30G MISC Use 4 times daily before meals as directed. 100 each 0  . metFORMIN (GLUCOPHAGE-XR) 500 MG 24 hr tablet Take 500 mg by mouth 2 (two) times daily.     . Multiple Vitamin (MULTIVITAMIN WITH MINERALS) TABS tablet Take 1 tablet by mouth daily.    . predniSONE (DELTASONE) 5 MG tablet Take 1 tablet (5 mg total) by mouth daily with breakfast. 90 tablet 3  . tamsulosin (FLOMAX) 0.4 MG CAPS capsule Take 2 capsules (0.8 mg total) by mouth daily after supper.    . vitamin B-12 (CYANOCOBALAMIN) 1000 MCG tablet Take 1 tablet (1,000 mcg total) by mouth daily. 30 tablet 0  . ZYTIGA 250 MG tablet TAKE 4 TABLETS BY MOUTH ONCE DAILY AS DIRECTED.  TAKE 1 HOUR BEFORE OR 2 HOURS AFTER  A MEAL (Patient taking differently: Take 1,000 mg by mouth daily. ) 120 tablet 10   Current Facility-Administered Medications  Medication Dose Route Frequency Provider Last Rate Last Admin  . morphine 4 MG/ML injection 2 mg  2 mg Intramuscular Once Bruning, Ashlyn, PA-C         Allergies: No Known Allergies     Physical Exam:   Blood pressure (!) 90/59, pulse (!) 110, temperature 97.8 F (36.6 C), temperature source Temporal, resp. rate 17, height '5\' 9"'$  (1.753 m), weight 234 lb 8 oz (106.4 kg), SpO2 94 %.     ECOG: 2     General appearance: Comfortable appearing without any discomfort Head: Normocephalic without any  trauma Oropharynx: Mucous membranes are moist and pink without any thrush or ulcers. Eyes: Pupils are equal and round reactive to light. Lymph nodes: No cervical, supraclavicular, inguinal or axillary lymphadenopathy.   Heart:regular rate and rhythm.  S1 and S2 without leg edema. Lung: Clear without any rhonchi or wheezes.  No dullness to percussion. Abdomin: Soft, nontender, nondistended with good bowel sounds.  No hepatosplenomegaly. Musculoskeletal: No joint deformity or effusion.  Full range of motion noted. Neurological: No obvious deficits noted. Skin: No petechial rash or dryness.  Appeared moist.          Lab Results: Lab Results  Component Value Date   WBC 7.3 05/27/2019   HGB 6.6 (LL) 05/27/2019   HCT 22.5 (L) 05/27/2019   MCV 84.3 05/27/2019   PLT 280 05/27/2019     Chemistry      Component Value Date/Time   NA 138 05/27/2019 1254   K 3.5 05/27/2019 1254   CL 92 (L) 05/27/2019 1254   CO2 33 (H) 05/27/2019 1254   BUN 38 (H) 05/27/2019 1254   CREATININE 2.38 (H) 05/27/2019 1254      Component Value Date/Time   CALCIUM 8.8 (L) 05/27/2019 1254   ALKPHOS 262 (H) 05/27/2019 1254   AST 62 (H) 05/27/2019 1254   ALT 41 05/27/2019 1254   BILITOT 0.5 05/27/2019 1254      Results for Jeffery [name], Jeffery [name] (MRN [id number]) as of 05/31/2019 08:27  Ref. Range 04/19/2019 08:27 05/27/2019 12:54  Prostate Specific Ag, Serum Latest Ref Range: 0.0 - 4.0 ng/mL 19.1 (H) 0.2      Impression and Plan:   59 year old man with:  1.  Advanced prostate cancer with disease to the bone and lymphadenopathy diagnosed in July 2020.  He has castration-sensitive disease at this time.  The natural course of this disease was reviewed again and the risk of disease progression into castration hyper resistant disease was reviewed.  He did have a increase in his PSA in January but after radiation therapy to the spine he did have a decrease.  Risks and benefits of continuing Zytiga versus  switching to a different regimen was discussed at this time.  Alternative options including Xtandi, systemic chemotherapy and Xofigo.  For the time being I recommended continuing Zytiga given his excellent PSA response.  2.  Androgen deprivation therapy: He status post orchiectomy.  We will continue to monitor his testosterone level to ensure castration levels.  3.  Bone directed therapy: I recommended continuing calcium and vitamin D supplements with consideration for Xgeva after dental clearance.  4.  Prognosis and goals of care: Disease remains incurable at this time and any treatment is palliative.  5.  Hypertension: His blood pressure is low at this time no additional antihypertensive is needed.  6.  Anemia:  Appears to be multifactorial in nature.  Related to renal disease, malignancy and recent radiation which could cause myelosuppression.  He did receive 2 units of packed red cell transfusion and will continue to monitor.  Growth factor support and Aranesp or Procrit may be needed.   7.  Pain control: Manageable at this time with gabapentin and hydrocodone which is refilled.  8.  Encephalopathy: He is following with Dr. Mickeal Skinner regarding this issue.  Work-up is ongoing.  He is on high doses of gabapentin with worsening renal insufficiency.  Medication could also be playing a factor here.  9. Follow-up: He will return in 6 weeks for repeat evaluation.  30  minutes was dedicated to this visit.  The time was spent on reviewing laboratory data, disease status update, treatment options and complications related to therapy.    Zola Button, MD 2/15/20219:01 AM therapy:

## 2019-05-31 NOTE — Telephone Encounter (Signed)
Left message for EEG dept to schedule EEG with patient.

## 2019-06-01 ENCOUNTER — Telehealth: Payer: Self-pay | Admitting: Oncology

## 2019-06-01 ENCOUNTER — Ambulatory Visit (HOSPITAL_COMMUNITY)
Admission: RE | Admit: 2019-06-01 | Discharge: 2019-06-01 | Disposition: A | Discharge status: Home / Self Care | Payer: Medicaid Other | Source: Ambulatory Visit | Attending: Family Medicine | Admitting: Family Medicine

## 2019-06-01 DIAGNOSIS — R531 Weakness: Secondary | ICD-10-CM | POA: Diagnosis not present

## 2019-06-01 DIAGNOSIS — R200 Anesthesia of skin: Secondary | ICD-10-CM | POA: Diagnosis not present

## 2019-06-01 DIAGNOSIS — R569 Unspecified convulsions: Secondary | ICD-10-CM

## 2019-06-01 LAB — LEVETIRACETAM LEVEL: Levetiracetam Lvl: <1 ug/mL — ABNORMAL LOW (ref 10.0–40.0)

## 2019-06-01 NOTE — Progress Notes (Signed)
EEG complete - results pending 

## 2019-06-01 NOTE — Telephone Encounter (Signed)
Scheduled appt per 2/15 los. ° °Sent a message to HIM pool to get a calendar mailed out. ° °

## 2019-06-01 NOTE — Procedures (Signed)
Patient Name: Jeffery [name]  MRN: [id number]  Epilepsy Attending: Lora Havens  Referring Physician/Provider: Dr Cecil Cobbs Date: 06/01/2019 Duration: 30.10mins  Patient history: 59yo m with seizure like episodes described as sudden onset right sided numbness with accompanying weakness; events begin in his hand and spread down his trunk into his right leg.   Level of alertness: awake, asleep  AEDs during EEG study: LEV, Gabapentin   Technical aspects: This EEG study was done with scalp electrodes positioned according to the 10-20 International system of electrode placement. Electrical activity was acquired at a sampling rate of 500Hz  and reviewed with a high frequency filter of 70Hz  and a low frequency filter of 1Hz . EEG data were recorded continuously and digitally stored.   DESCRIPTION:  The posterior dominant rhythm consists of 7 Hz activity of moderate voltage (25-35 uV) seen predominantly in posterior head regions, symmetric and reactive to eye opening and eye closing. Sleep was characterized by vertex waves. EEG showed continuous generalized 6-7hz  theta slowing as well as intermittent generalized 2-3hz  delta slowing.  Photic stimulation was not seen during photic stimulation. Hyperventilation was not performed.  ABNORMALITY - Continuous slow, generalized  IMPRESSION: This study is suggestive of mild diffuse encephalopathy, non specific to etiology. No seizures or definite epileptiform discharges were seen throughout the recording.  Jeffery [name]

## 2019-06-02 ENCOUNTER — Telehealth: Payer: Self-pay

## 2019-06-02 NOTE — Telephone Encounter (Signed)
In follow-up to note from Leanne Chang, RN on 05/31/19, Jeffery [name] and [facility] will not be able to provide services to patient. Referral and patient information faxed to [facility].

## 2019-06-03 ENCOUNTER — Ambulatory Visit: Payer: Medicaid Other | Admitting: Internal Medicine

## 2019-06-07 ENCOUNTER — Telehealth: Payer: Self-pay

## 2019-06-07 NOTE — Telephone Encounter (Signed)
-----   Message from Wyatt Portela, MD sent at 06/07/2019  9:20 AM EST ----- Yes. Thanks ----- Message ----- From: Tami Lin, RN Sent: 06/07/2019   9:10 AM EST To: Wyatt Portela, MD   Verdis Frederickson, physical therapist from [facility] called requesting a verbal order for PT once weekly x 5 weeks. Is it ok to give her the verbal order? Thanks, Lanelle Bal

## 2019-06-07 NOTE — Telephone Encounter (Signed)
Called and gave Verdis Frederickson a verbal order for PT once weekly x 5 weeks.

## 2019-06-09 ENCOUNTER — Ambulatory Visit
Admission: RE | Admit: 2019-06-09 | Discharge: 2019-06-09 | Disposition: A | Discharge status: Home / Self Care | Payer: Medicaid Other | Source: Ambulatory Visit | Attending: Urology | Admitting: Urology

## 2019-06-09 ENCOUNTER — Encounter: Payer: Self-pay | Admitting: Urology

## 2019-06-09 ENCOUNTER — Other Ambulatory Visit: Payer: Self-pay

## 2019-06-09 DIAGNOSIS — C61 Malignant neoplasm of prostate: Secondary | ICD-10-CM

## 2019-06-09 NOTE — Progress Notes (Signed)
Radiation Oncology         [phone] ________________________________  Name: Jeffery [name] MRN: [id number]  Date: 06/09/2019  DOB: 1960/05/04  Outpatient Post-treatment follow up visit  CC: [name], MD  [name], *  Diagnosis:   59 y.o. 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  Interval Since Last Radiation:  2 weeks  1/7-20/21:  1.  The spine from C6-T2 was treated to 30 Gy in 10 fractions of 3 Gy 2.  The spine from L1-L3 was treated to 30 Gy in 10 fractions of 3 Gy  02/01/19 - 02/12/19:  The patient received 30 Gy in 10 fractions to the painful sites of metastatic disease in the right hip and right scapula.  Narrative:  The patient returns today for re-evaluation. In summary, this is a 59 y.o. gentleman who was diagnosed with Gleason 4+5 prostate cancer on 09/17/2018 with a PSA of 410. He was found to have findings suspicious for metastatic bladder cancer on staging CT A/P on 10/11/2018. He proceeded to bilateral orchiectomy for LT-ADT. He was also started on Zytiga on 11/16/2018 under Dr. Alen Blew. Restaging CT A/P on 12/23/2018 revealed improvement to adenopathy but progression of bone metastases. He was referred to [place] for consideration of palliative radiation to the painful sites of metastases in the right hip and right scapula, treated as outlined above. He continues on daily Zytiga/predinsone under the care and direction of Dr. Alen Blew.  During his recent radiation treatments, he developed nagging pain in his lower back which was persistent and worse with activity, limiting his quality of life.  We discussed treating the disease in the lumbar spine at that time but he wished to postpone further radiation treatment until 04/2019, after the holidays.  At the time of his routine 1 month follow up visit, he reported significant improvement in the right shoulder and right hip pain since completion of radiation.  He  also confirmed that the low back pain remained persistent but tolerable at that time and denied any other focal sites of pain or weakness.  He then presented to the ED on 04/15/2019 with progressive pain in the upper mid-back and left arm numbness. He underwent cervical and thoracic spine MRI at that time, which revealed widespread osseous metastatic disease throughout the thoracic spine with expansile disease of the posterior elements on the left at T1, with a small amount of extraosseous tumor including dorsal epidural tumor, but without cord compression, likely causing foraminal encroachment on the left at the C7-T1 level that could affect the left C8 nerve.disease, attributing to the numbness/weakness in the left forearm.  Additionally noted were acute pathologic fractures of T11 and T12 with minimal height loss. He was referred back to the radiation oncology clinic on 04/20/19 to discuss further palliative radiotherapy to the progressive sites of disease in the spine which he elected to proceed with.  He completed a 2 week course of palliative XRT to the cervical and lumbar spine on 05/05/19 which he tolerated well.   Interval History:  He reports resolution of the paraesthesias in the left upper and lower extremities since completing his radiation and his back pain is significantly improved. He continues with intermittent pain that moves around from site to site but denies any specific persistent focal pain.  More recently, he developed intermittent episodes of weakness in the right upper and lower extremities that had caused him to fall on at least one occasion recently, and  nearly falling on multiple others.  At his last visit on 05/20/19, he reported that he "loses control of the right side of my body" without warning, sometimes multiple times a day. He denied any bowel or bladder dysfunction, denied losing consciousness, difficulty with speech, headaches, blurry vision or seizure activity.  His mother had  called the clinic on 05/19/19 stating that she was concerned that he was having seizures because she found him in his room, staring off into space and he was not responding to her when she spoke to him.  He reports that he recalls that interaction and that he was "simply relaxing in his room after having a shower and just did not feel like being bothered".  He had an MRI brain on 05/20/19 for further evaluation of the new right sided weakness and paraesthesias and fortunately, this did not show any evidence of intracranial metastatic disease or other significant abnormalities.  He met with Dr. Mickeal Skinner on 24/21 for further evaluation/management of the new neurologic symptoms and was started on Keppra but reports that he did not tolerate that medication and only took it for 2-3 days.  He had recent labs and EEG and has a scheduled follow up with Dr. Mickeal Skinner on 06/10/19 to review results and recommendations.  Fortunately, the right sided weakness and paraesthesias have resolved and he has not had any further episodes of the right side giving out on him in the past 3 weeks. He also denies any further pain in the forehead, which had been discussed with Dr. Tammi Klippel during his most recent course of radiation.  He reports that his diffuse body pain is fairly well controlled at this time, using Hydrocodone 2 tablets every 6 hours.  He does find that he has increased pain in the mornings, making it difficult to get started/get moving and has to wait for the hydrocodone to take effect before he can really do much of anything which is affecting his quality of life.  He is inquiring as to whether there is a longer acting medication that might help or an adjustment in how he takes his current pain medications.  I advised that I will defer this to Dr. Alen Blew so that we do not have multiple prescribers for his narcotic medications but that I will cal his nurse directly to request input from Dr. Alen Blew regarding his preference for managing  the morning pain.  On review of systems, the patient reports resolution of the severe pain and numbness in his left forearm and significant improvement in the mid-back pain at the level of C7-T1. [name] He also reports resolution of the stabbing pain in the lateral left hip and denies paraesthesias in the LLE or groin pain. He has not had any further episodes of weakness in the right upper and lower extremities in the past 3 weeks. He denies any bowel or bladder dysfunction, denies losing consciousness, difficulty with speech, headaches, blurry vision or seizure activity. He continues with intermittent pain that moves around from site to site but denies any specific persistent focal pain. He is taking Vicodin prn to control the pain as he did NOT tolerate the Fentanyl patch.  He reports that the Fentanyl made him "go out of my head" and he did not like how he felt on it.  ALLERGIES:  has No Known Allergies.  Meds: Current Outpatient Medications  Medication Sig Dispense Refill  . Amino Acids-Protein Hydrolys (FEEDING SUPPLEMENT, PRO-STAT SUGAR FREE 64,) LIQD Take 30 mLs by mouth 3 (three)  times daily. 887 mL 0  . Blood Glucose Monitoring Suppl (RELION CONFIRM GLUCOSE MONITOR) w/Device KIT Use 4 times daily before meals and bedtime as directed. 1 kit 0  . feeding supplement, ENSURE ENLIVE, (ENSURE ENLIVE) LIQD Take 237 mLs by mouth daily. 237 mL 12  . finasteride (PROSCAR) 5 MG tablet Take 5 mg by mouth daily.    . furosemide (LASIX) 40 MG tablet Take 40 mg by mouth daily.    [name] gabapentin (NEURONTIN) 300 MG capsule Take 1-2 capsules (300-600 mg total) by mouth 3 (three) times daily. '300mg'$  in am and '600mg'$  in evening 180 capsule 3  . glucose blood (RELION GLUCOSE TEST STRIPS) test strip Use as instructed 100 each 12  . HYDROcodone-acetaminophen (NORCO/VICODIN) 5-325 MG tablet TAKE 1 TO 2 TABLETS BY MOUTH EVERY 4 TO 6 HOURS AS NEEDED FOR PAIN (MAX OF 8 TABLETS IN 24 HOURS) 90 tablet 0  . Lancets 30G MISC Use  4 times daily before meals as directed. 100 each 0  . metFORMIN (GLUCOPHAGE-XR) 500 MG 24 hr tablet Take 500 mg by mouth 2 (two) times daily.     . Multiple Vitamin (MULTIVITAMIN WITH MINERALS) TABS tablet Take 1 tablet by mouth daily.    . predniSONE (DELTASONE) 5 MG tablet Take 1 tablet (5 mg total) by mouth daily with breakfast. 90 tablet 3  . tamsulosin (FLOMAX) 0.4 MG CAPS capsule Take 2 capsules (0.8 mg total) by mouth daily after supper.    . vitamin B-12 (CYANOCOBALAMIN) 1000 MCG tablet Take 1 tablet (1,000 mcg total) by mouth daily. 30 tablet 0  . ZYTIGA 250 MG tablet TAKE 4 TABLETS BY MOUTH ONCE DAILY AS DIRECTED.  TAKE 1 HOUR BEFORE OR 2 HOURS AFTER A MEAL (Patient taking differently: Take 1,000 mg by mouth daily. ) 120 tablet 10   Current Facility-Administered Medications  Medication Dose Route Frequency Provider Last Rate Last Admin  . morphine 4 MG/ML injection 2 mg  2 mg Intramuscular Once Maxemiliano Riel, PA-C        Physical Findings:  vitals were not taken for this visit.   Karen Kays to assess due to telephone follow up visit format.    Lab Findings: Lab Results  Component Value Date   WBC 7.3 05/27/2019   HGB 6.6 (LL) 05/27/2019   HCT 22.5 (L) 05/27/2019   MCV 84.3 05/27/2019   PLT 280 05/27/2019     Radiographic Findings: MR Brain W Wo Contrast  Result Date: 05/20/2019 CLINICAL DATA:  Prostate cancer, seizure activity EXAM: MRI HEAD WITHOUT AND WITH CONTRAST TECHNIQUE: Multiplanar, multiecho pulse sequences of the brain and surrounding structures were obtained without and with intravenous contrast. CONTRAST:  43m GADAVIST GADOBUTROL 1 MMOL/ML IV SOLN COMPARISON:  None FINDINGS: Brain: There is no acute infarction or intracranial hemorrhage. There is no intracranial mass, mass effect, or edema. There is no hydrocephalus or extra-axial fluid collection. Ventricles and sulci are within normal limits in size and configuration. Minimal patchy T2 hyperintensity in the  supratentorial white matter is nonspecific but may reflect minor chronic microvascular ischemic changes. Small right parasagittal parietal developmental venous anomaly. No pathologic enhancement identified. Vascular: Major vessel flow voids at the skull base are preserved. Skull and upper cervical spine: Abnormal T1 marrow signal with heterogeneity likely reflecting diffuse osseous metastatic disease. There is no evidence of significant extraosseous extension. Sinuses/Orbits: Minor mucosal thickening including small right maxillary sinus retention cysts or polyps. Orbits are unremarkable. Other: Sella is unremarkable.  Mastoid air cells are clear.  IMPRESSION: No evidence of intracranial metastatic disease or other significant abnormality. Diffuse osseous metastatic disease. Electronically Signed   By: Macy Mis M.D.   On: 05/20/2019 10:31   EEG adult  Result Date: 06/01/2019 Lora Havens, MD     06/01/2019  4:42 PM Patient Name: Jeffery [name] MRN: [id number] Epilepsy Attending: Lora Havens Referring Physician/Provider: Dr Cecil Cobbs Date: 06/01/2019 Duration: 30.4mns Patient history: 57yom with seizure like episodes described as sudden onset right sided numbness with accompanying weakness; events begin in his hand and spread down his trunk into his right leg. Level of alertness: awake, asleep AEDs during EEG study: LEV, Gabapentin Technical aspects: This EEG study was done with scalp electrodes positioned according to the 10-20 International system of electrode placement. Electrical activity was acquired at a sampling rate of 500Hz  and reviewed with a high frequency filter of 70Hz  and a low frequency filter of 1Hz . EEG data were recorded continuously and digitally stored. DESCRIPTION:  The posterior dominant rhythm consists of 7 Hz activity of moderate voltage (25-35 uV) seen predominantly in posterior head regions, symmetric and reactive to eye opening and eye closing. Sleep was  characterized by vertex waves. EEG showed continuous generalized 6-7hz  theta slowing as well as intermittent generalized 2-3hz  delta slowing.  Photic stimulation was not seen during photic stimulation. Hyperventilation was not performed. ABNORMALITY - Continuous slow, generalized IMPRESSION: This study is suggestive of mild diffuse encephalopathy, non specific to etiology. No seizures or definite epileptiform discharges were seen throughout the recording. PLora Havens   Impression/Plan: 1. 59y.o. gentleman with advanced stage IV prostate cancer with painful spinal metastases in the thoracic and lumbar spine. He has known widespread osseous metastatic disease which has responded well to palliative radiation, currently with satisfactory pain control.  At this point, we will plan to see the patient back on an as needed basis but are more than happy to continue to participate in his care if clinically indicated in the future. He will keep his scheduled follow up with Dr. VMickeal Skinneron 06/10/19 to review recent labs and EEG results. He will also continue in routine follow up with Dr. SAlen Blewwith his next scheduled follow up on  06/24/19 for continued systemic disease management. He is s/p bilateral orchiectomy on 10/12/18 for permanent ADT.  He continues to tolerate the Zytiga well. Recent PSA was 0.2 on 05/27/19 following completion of XRT, previously 19.1 on 04/19/19.  The current plan is to continue on daily Zytiga/predinsone under the care and direction of/ Dr. SAlen Blew    ANicholos Johns PA-C    MTyler Pita MD  [facility] Direct Dial: [phone] Fax: [phone]  Skype  LinkedIn

## 2019-06-10 ENCOUNTER — Ambulatory Visit (HOSPITAL_COMMUNITY)
Admission: RE | Admit: 2019-06-10 | Discharge: 2019-06-10 | Disposition: A | Discharge status: Home / Self Care | Payer: Medicaid Other | Source: Ambulatory Visit | Attending: Internal Medicine | Admitting: Internal Medicine

## 2019-06-10 ENCOUNTER — Other Ambulatory Visit: Payer: Self-pay

## 2019-06-10 ENCOUNTER — Inpatient Hospital Stay (HOSPITAL_BASED_OUTPATIENT_CLINIC_OR_DEPARTMENT_OTHER): Payer: Medicaid Other | Admitting: Internal Medicine

## 2019-06-10 VITALS — BP 110/69 | HR 124 | Temp 99.1°F | Resp 20 | Ht 69.0 in | Wt 227.4 lb

## 2019-06-10 DIAGNOSIS — J90 Pleural effusion, not elsewhere classified: Secondary | ICD-10-CM

## 2019-06-10 DIAGNOSIS — C61 Malignant neoplasm of prostate: Secondary | ICD-10-CM | POA: Diagnosis not present

## 2019-06-10 IMAGING — CR DG CHEST 2V
2 series · 2 of 2 positions shown · non-contrast
Comparison: Chest x-ray [DATE].

CLINICAL DATA: 58-year-old male with history of pleural effusion.
Recurrent dyspnea. Hypoxia.

EXAM:
CHEST - 2 VIEW

[w chest pa]
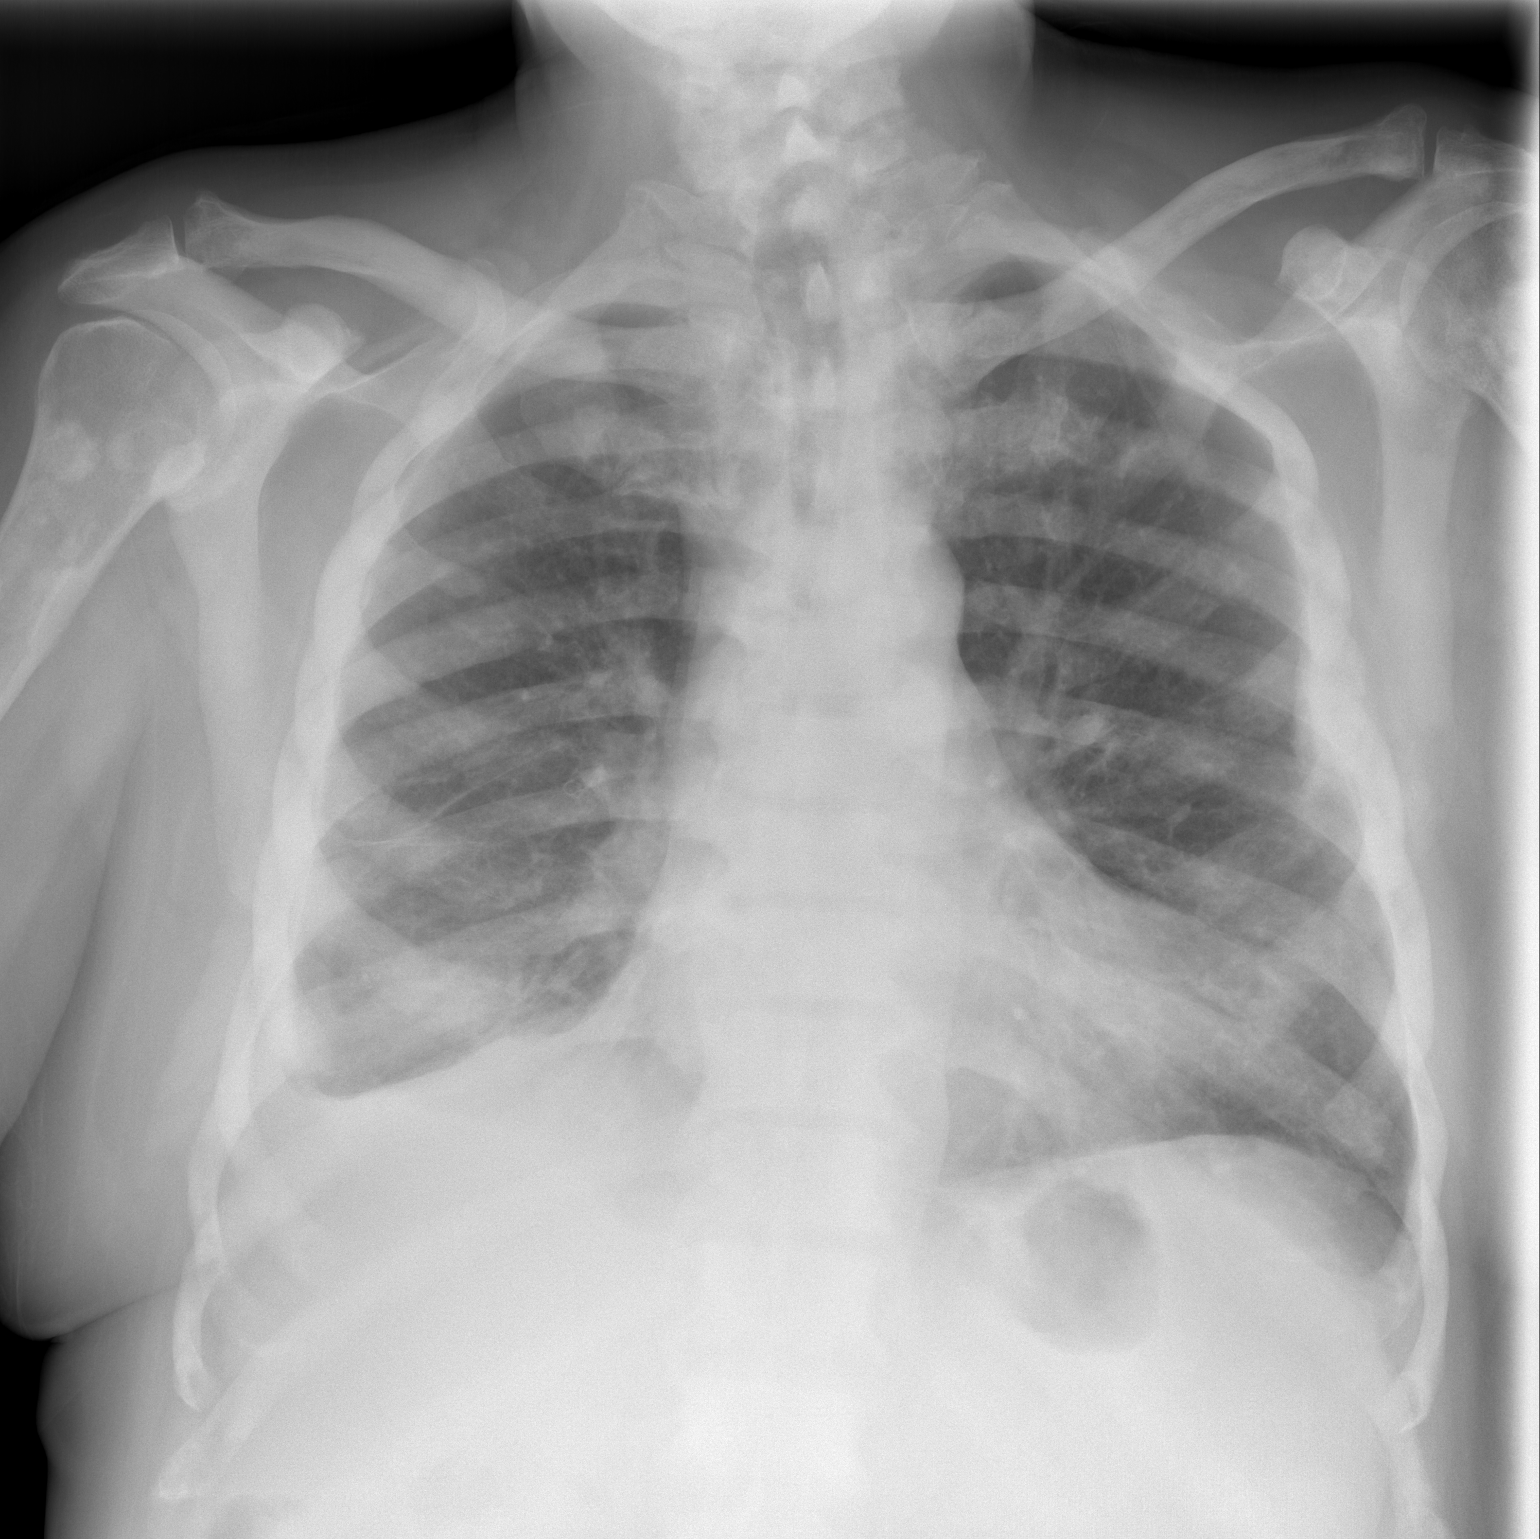

[w chest lat]
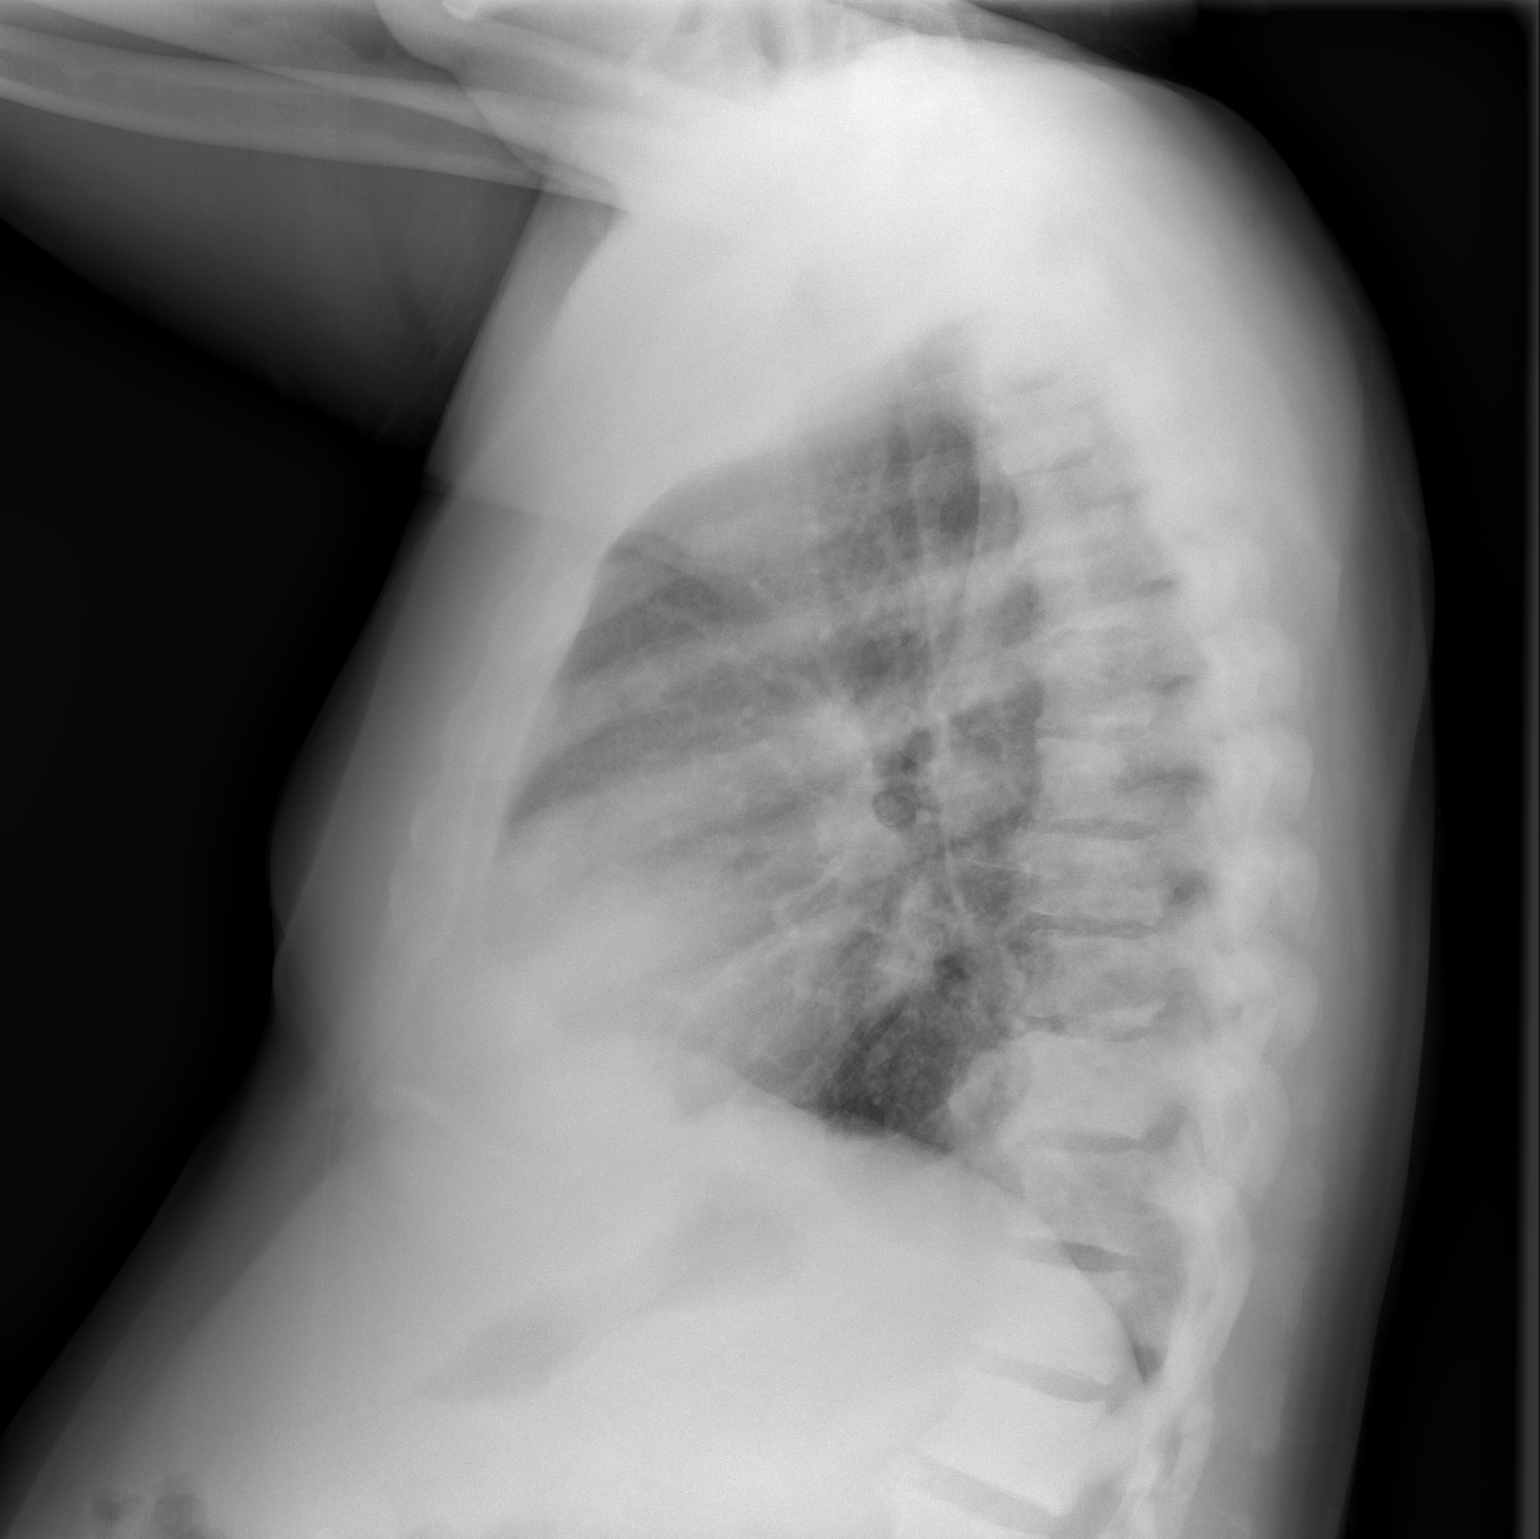

[2 of 2 positions shown; findings below may reference images not displayed]

FINDINGS: Small right pleural effusion. No left pleural effusion. Subsegmental
atelectasis in the right lung base. No acute consolidative airspace
disease. No pneumothorax. No evidence of pulmonary edema. Heart size
is normal. Upper mediastinal contours are within normal limits.
Numerous sclerotic osseous lesions scattered throughout the
visualized axial and appendicular skeleton, compatible with
widespread metastatic disease.
IMPRESSION: 1. Small right pleural effusion with some subsegmental atelectasis
in the right lung base.
2. Widespread metastatic disease to the bones.

## 2019-06-10 NOTE — Progress Notes (Signed)
[facility] at [place], [place] [address] [phone]   Interval Evaluation  Date of Service: 06/10/19 Patient Name: Jeffery [name] Patient MRN: [id number] Patient DOB: Jun 11, 1960 Provider: Ventura Sellers, MD  Identifying Statement:  Jeffery [name] is a 59 y.o. male with metabolic encephalopathy   Primary Cancer: Metastatic Prostate  Oncologic History: 05/06/19: Completes 10 day course of RT to C6-T2 vertebral bodies   Interval History:  Jeffery [name] presents today for follow after recent testing.  He describes clear improvement in clarity of cognition following the transfusion last week.  He does describe persistent "heavy cough" and shortness of breath with activity.  Tremulousness and "shakiness" of hands has persisted, but there has been no recurrence of paroxysmal right sided dyfunction.  H+P (05/20/19) Patient presents to clinic today to discuss recent neurologic phenomena.  He describes several weeks history of sudden onset right sided numbness with accompanying weakness; events begin in his hand and spread down his trunk into his right leg.  Each time he has nearly fallen, and has been fortunate to have another person nearby. Event occurs over several minutes and resolves independently.  He does not describe any post-event weakness, sensory loss, or confusion/drowsiness.  His wife feels he was "out of it" after one of the episodes, but he doesn't agree as he clamed to be "just resting". There is no apparent positional provocation.  Frequency has been "several times per week" at least since completing radiation therapy on 1/21.    Medications: Current Outpatient Medications on File Prior to Visit  Medication Sig Dispense Refill  . Amino Acids-Protein Hydrolys (FEEDING SUPPLEMENT, PRO-STAT SUGAR FREE 64,) LIQD Take 30 mLs by mouth 3 (three) times daily. 887 mL 0  . Blood Glucose Monitoring Suppl (RELION CONFIRM  GLUCOSE MONITOR) w/Device KIT Use 4 times daily before meals and bedtime as directed. 1 kit 0  . feeding supplement, ENSURE ENLIVE, (ENSURE ENLIVE) LIQD Take 237 mLs by mouth daily. 237 mL 12  . finasteride (PROSCAR) 5 MG tablet Take 5 mg by mouth daily.    . furosemide (LASIX) 40 MG tablet Take 40 mg by mouth daily.    [name] gabapentin (NEURONTIN) 300 MG capsule Take 1-2 capsules (300-600 mg total) by mouth 3 (three) times daily. 372m in am and 6031min evening 180 capsule 3  . glucose blood (RELION GLUCOSE TEST STRIPS) test strip Use as instructed 100 each 12  . HYDROcodone-acetaminophen (NORCO/VICODIN) 5-325 MG tablet TAKE 1 TO 2 TABLETS BY MOUTH EVERY 4 TO 6 HOURS AS NEEDED FOR PAIN (MAX OF 8 TABLETS IN 24 HOURS) 90 tablet 0  . Lancets 30G MISC Use 4 times daily before meals as directed. 100 each 0  . metFORMIN (GLUCOPHAGE-XR) 500 MG 24 hr tablet Take 500 mg by mouth 2 (two) times daily.     . Multiple Vitamin (MULTIVITAMIN WITH MINERALS) TABS tablet Take 1 tablet by mouth daily.    . predniSONE (DELTASONE) 5 MG tablet Take 1 tablet (5 mg total) by mouth daily with breakfast. 90 tablet 3  . tamsulosin (FLOMAX) 0.4 MG CAPS capsule Take 2 capsules (0.8 mg total) by mouth daily after supper.    . vitamin B-12 (CYANOCOBALAMIN) 1000 MCG tablet Take 1 tablet (1,000 mcg total) by mouth daily. 30 tablet 0  . ZYTIGA 250 MG tablet TAKE 4 TABLETS BY MOUTH ONCE DAILY AS DIRECTED.  TAKE 1 HOUR BEFORE OR 2 HOURS AFTER A MEAL (Patient  taking differently: Take 1,000 mg by mouth daily. ) 120 tablet 10   Current Facility-Administered Medications on File Prior to Visit  Medication Dose Route Frequency Provider Last Rate Last Admin  . morphine 4 MG/ML injection 2 mg  2 mg Intramuscular Once Bruning, Ashlyn, PA-C        Allergies: No Known Allergies Past Medical History:  Past Medical History:  Diagnosis Date  . Cancer ([place])    stage IV prostate cancer per patient  . Diabetes mellitus, new onset ([place]) 07/2016    . Enlarged prostate   . HLD (hyperlipidemia)    Past Surgical History:  Past Surgical History:  Procedure Laterality Date  . I & D EXTREMITY Left 01/27/2019   Procedure: IRRIGATION AND DEBRIDEMENT EXTREMITY;  Surgeon: Iran Planas, MD;  Location: [facility];  Service: Orthopedics;  Laterality: Left;  . KNEE ARTHROSCOPY Right   . ORCHIECTOMY Bilateral 10/12/2018   Procedure: ORCHIECTOMY;  Surgeon: Lucas Mallow, MD;  Location: [facility];  Service: Urology;  Laterality: Bilateral;  . TRANSURETHRAL RESECTION OF PROSTATE  10/12/2018   Procedure: CYSTOSCOPY WITH URETHRAL DILATION;  Surgeon: Lucas Mallow, MD;  Location: [facility];  Service: Urology;;   Social History:  Social History   Socioeconomic History  . Marital status: Divorced    Spouse name: Not on file  . Number of children: 1  . Years of education: Not on file  . Highest education level: Not on file  Occupational History  . Occupation: Teaching laboratory technician  Tobacco Use  . Smoking status: Former Smoker    Packs/day: 1.50    Years: 42.00    Pack years: 63.00    Types: Cigarettes    Quit date: 2015    Years since quitting: 6.1  . Smokeless tobacco: Never Used  . Tobacco comment: 2015  Substance and Sexual Activity  . Alcohol use: No  . Drug use: No  . Sexual activity: Not Currently  Other Topics Concern  . Not on file  Social History Narrative   The patient is separated he has 1 son, he is a Academic librarian.   Quit smoking 2015, no alcohol no caffeine no tobacco or drug use   No health insurance   Social Determinants of Health   Financial Resource Strain:   . Difficulty of Paying Living Expenses: Not on file  Food Insecurity:   . Worried About Charity fundraiser in the Last Year: Not on file  . Ran Out of Food in the Last Year: Not on file  Transportation Needs:   . Lack of Transportation (Medical): Not on file  . Lack of Transportation (Non-Medical): Not on file  Physical  Activity:   . Days of Exercise per Week: Not on file  . Minutes of Exercise per Session: Not on file  Stress:   . Feeling of Stress : Not on file  Social Connections:   . Frequency of Communication with Friends and Family: Not on file  . Frequency of Social Gatherings with Friends and Family: Not on file  . Attends Religious Services: Not on file  . Active Member of Clubs or Organizations: Not on file  . Attends Archivist Meetings: Not on file  . Marital Status: Not on file  Intimate Partner Violence:   . Fear of Current or Ex-Partner: Not on file  . Emotionally Abused: Not on file  . Physically Abused: Not on file  . Sexually Abused: Not on file  Family History:  Family History  Problem Relation Age of Onset  . Prostate cancer Father   . Heart disease Father   . Prostate cancer Paternal Grandfather     Review of Systems: Constitutional: Doesn't report fevers, chills or abnormal weight loss Eyes: Doesn't report blurriness of vision Ears, nose, mouth, throat, and face: Doesn't report sore throat Respiratory: Doesn't report cough, dyspnea or wheezes Cardiovascular: Doesn't report palpitation, chest discomfort  Gastrointestinal:  Doesn't report nausea, constipation, diarrhea GU: Doesn't report incontinence Skin: Doesn't report skin rashes Neurological: Per HPI Musculoskeletal: Doesn't report joint pain Behavioral/Psych: Doesn't report anxiety  Physical Exam: Vitals:   06/10/19 1056  BP: 110/69  Pulse: (!) 124  Resp: 20  Temp: 99.1 F (37.3 C)  SpO2: 93%   KPS: 70. General: Alert, cooperative, pleasant, in no acute distress Head: Normal EENT: No conjunctival injection or scleral icterus.  Lungs: Resp effort increased Cardiac: Regular rate Abdomen: Non-distended abdomen Skin: No rashes cyanosis or petechiae. Extremities: No clubbing or edema  Neurologic Exam: Mental Status: Awake, alert, attentive to examiner. Oriented to self and environment.  Language is fluent with intact comprehension.  Age advanced psychomotor slowing, waxing/waning clarity of thought pattern Cranial Nerves: Visual acuity is grossly normal. Visual fields are full. Extra-ocular movements intact. No ptosis. Face is symmetric Motor: Tone and bulk are normal. 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 Reflexes are symmetric, no pathologic reflexes present.  Sensory: Intact to light touch Gait: Deferred   Labs: I have reviewed the data as listed    Component Value Date/Time   NA 138 05/27/2019 1254   K 3.5 05/27/2019 1254   CL 92 (L) 05/27/2019 1254   CO2 33 (H) 05/27/2019 1254   GLUCOSE 248 (H) 05/27/2019 1254   BUN 38 (H) 05/27/2019 1254   CREATININE 2.38 (H) 05/27/2019 1254   CALCIUM 8.8 (L) 05/27/2019 1254   PROT 7.0 05/27/2019 1254   ALBUMIN 2.0 (L) 05/27/2019 1254   AST 62 (H) 05/27/2019 1254   ALT 41 05/27/2019 1254   ALKPHOS 262 (H) 05/27/2019 1254   BILITOT 0.5 05/27/2019 1254   GFRNONAA 29 (L) 05/27/2019 1254   GFRAA 34 (L) 05/27/2019 1254   Lab Results  Component Value Date   WBC 7.3 05/27/2019   NEUTROABS 6.3 05/27/2019   HGB 6.6 (LL) 05/27/2019   HCT 22.5 (L) 05/27/2019   MCV 84.3 05/27/2019   PLT 280 05/27/2019    Imaging:  MR Brain W Wo Contrast  Result Date: 05/20/2019 CLINICAL DATA:  Prostate cancer, seizure activity EXAM: MRI HEAD WITHOUT AND WITH CONTRAST TECHNIQUE: Multiplanar, multiecho pulse sequences of the brain and surrounding structures were obtained without and with intravenous contrast. CONTRAST:  56m GADAVIST GADOBUTROL 1 MMOL/ML IV SOLN COMPARISON:  None FINDINGS: Brain: There is no acute infarction or intracranial hemorrhage. There is no intracranial mass, mass effect, or edema. There is no hydrocephalus or extra-axial fluid collection. Ventricles and sulci are within normal limits in size and configuration. Minimal patchy T2 hyperintensity in the  supratentorial white matter is nonspecific but may reflect minor chronic microvascular ischemic changes. Small right parasagittal parietal developmental venous anomaly. No pathologic enhancement identified. Vascular: Major vessel flow voids at the skull base are preserved. Skull and upper cervical spine: Abnormal T1 marrow signal with heterogeneity likely reflecting diffuse osseous metastatic disease. There is no evidence of significant extraosseous extension. Sinuses/Orbits: Minor mucosal thickening including small right maxillary sinus retention cysts or polyps.  Orbits are unremarkable. Other: Sella is unremarkable.  Mastoid air cells are clear. IMPRESSION: No evidence of intracranial metastatic disease or other significant abnormality. Diffuse osseous metastatic disease. Electronically Signed   By: Macy Mis M.D.   On: 05/20/2019 10:31   EEG adult  Result Date: 06/01/2019 Lora Havens, MD     06/01/2019  4:42 PM Patient Name: Jeffery [name] MRN: [id number] Epilepsy Attending: Lora Havens Referring Physician/Provider: Dr Cecil Cobbs Date: 06/01/2019 Duration: 30.75mns Patient history: 89yom with seizure like episodes described as sudden onset right sided numbness with accompanying weakness; events begin in his hand and spread down his trunk into his right leg. Level of alertness: awake, asleep AEDs during EEG study: LEV, Gabapentin Technical aspects: This EEG study was done with scalp electrodes positioned according to the 10-20 International system of electrode placement. Electrical activity was acquired at a sampling rate of _0  and reviewed with a high frequency filter of _1  and a low frequency filter of _2 . EEG data were recorded continuously and digitally stored. DESCRIPTION:  The posterior dominant rhythm consists of 7 Hz activity of moderate voltage (25-35 uV) seen predominantly in posterior head regions, symmetric and reactive to eye opening and eye closing. Sleep was  characterized by vertex waves. EEG showed continuous generalized 6-_3  theta slowing as well as intermittent generalized 2-_4  delta slowing.  Photic stimulation was not seen during photic stimulation. Hyperventilation was not performed. ABNORMALITY - Continuous slow, generalized IMPRESSION: This study is suggestive of mild diffuse encephalopathy, non specific to etiology. No seizures or definite epileptiform discharges were seen throughout the recording. Jeffery [name]    Assessment/Plan Metabolic Encephalopathy  Mr. WSimonispresents metabolic encephalopathy secondary to anemia, progressive renal dysfunction and uremia, as well as relative hypoxia due to unclear lung pathology.  In addition to history and neurologic exam, brain MRI, serologic workup and EEG are supportive of this diagnosis.  We recommended obtaining a chest x-ray today, given dyspnea and O2 saturation (reportedly in the 80s with activity per home nurse).  If recurrence of pleural effusion is recognized, will communicate with Dr. TSatira Sarkand SBurlison   Recommended continued monitoring of CBC and utilization of RBC transfusion as needed to maintain Hgb >9.0 if possible.  Will continue to follow with PCP and nephrology regarding renal insufficiency.   He should return to clinic in 1 month to review clinical status.  We appreciate the opportunity to participate in the care of Jeffery [name]   All questions were answered. The patient knows to call the clinic with any problems, questions or concerns. No barriers to learning were detected.  The total time spent in the encounter was 40 minutes and more than 50% was on counseling and review of test results   ZVentura Sellers MD Medical Director of Neuro-Oncology [facility]02/25/21 10:56 AM

## 2019-06-11 ENCOUNTER — Telehealth: Payer: Self-pay | Admitting: Internal Medicine

## 2019-06-11 NOTE — Telephone Encounter (Signed)
Scheduled appt per 2/25 los.  Spoke with pt and he is aware of his appt date and time.

## 2019-06-14 ENCOUNTER — Telehealth: Payer: Self-pay | Admitting: Internal Medicine

## 2019-06-14 NOTE — Telephone Encounter (Signed)
Called patient to schedule Palliative Consult, no answer went straight to voicemail, left message with reason for call along with my contact name and number.

## 2019-06-15 ENCOUNTER — Other Ambulatory Visit: Payer: Self-pay | Admitting: Oncology

## 2019-06-15 DIAGNOSIS — M7918 Myalgia, other site: Secondary | ICD-10-CM

## 2019-06-15 MED FILL — HYDROCODON-APAP 5-325: 5-325 | 11 days supply | Qty: 90 | Fill #0

## 2019-06-17 ENCOUNTER — Telehealth: Payer: Self-pay | Admitting: Internal Medicine

## 2019-06-17 NOTE — Telephone Encounter (Signed)
Rec'd call back from patient's mother Vivi Ferns and after discussing Palliative services with her and all questions were answered, she was in agreement with starting services.  I have scheduled an In-person Consult for 06/19/2019 @ 10 AM

## 2019-06-17 NOTE — Telephone Encounter (Signed)
Rec'd message that patient was having problems with his phone and to contact patient's mother Hassan Rowan to try and schedule the Palliative Consult.  Eula Fried with no answer - left message with reason for call along with my name and contact number.

## 2019-06-23 ENCOUNTER — Emergency Department (HOSPITAL_COMMUNITY): Payer: Medicaid Other

## 2019-06-23 ENCOUNTER — Telehealth: Payer: Self-pay

## 2019-06-23 ENCOUNTER — Other Ambulatory Visit: Payer: Self-pay

## 2019-06-23 ENCOUNTER — Inpatient Hospital Stay (HOSPITAL_COMMUNITY)
Admission: EM | Admit: 2019-06-23 | Discharge: 2019-07-15 | DRG: 871 | Disposition: E | Payer: Medicaid Other | Attending: Internal Medicine | Admitting: Internal Medicine

## 2019-06-23 ENCOUNTER — Encounter (HOSPITAL_COMMUNITY): Payer: Self-pay

## 2019-06-23 DIAGNOSIS — R195 Other fecal abnormalities: Secondary | ICD-10-CM | POA: Diagnosis not present

## 2019-06-23 DIAGNOSIS — Z923 Personal history of irradiation: Secondary | ICD-10-CM

## 2019-06-23 DIAGNOSIS — M7989 Other specified soft tissue disorders: Secondary | ICD-10-CM | POA: Diagnosis not present

## 2019-06-23 DIAGNOSIS — E1165 Type 2 diabetes mellitus with hyperglycemia: Secondary | ICD-10-CM | POA: Diagnosis present

## 2019-06-23 DIAGNOSIS — N1832 Chronic kidney disease, stage 3b: Secondary | ICD-10-CM

## 2019-06-23 DIAGNOSIS — B952 Enterococcus as the cause of diseases classified elsewhere: Secondary | ICD-10-CM

## 2019-06-23 DIAGNOSIS — R339 Retention of urine, unspecified: Secondary | ICD-10-CM | POA: Diagnosis not present

## 2019-06-23 DIAGNOSIS — K921 Melena: Secondary | ICD-10-CM

## 2019-06-23 DIAGNOSIS — N1330 Unspecified hydronephrosis: Secondary | ICD-10-CM

## 2019-06-23 DIAGNOSIS — I959 Hypotension, unspecified: Secondary | ICD-10-CM | POA: Diagnosis not present

## 2019-06-23 DIAGNOSIS — J9601 Acute respiratory failure with hypoxia: Secondary | ICD-10-CM | POA: Diagnosis not present

## 2019-06-23 DIAGNOSIS — N390 Urinary tract infection, site not specified: Secondary | ICD-10-CM

## 2019-06-23 DIAGNOSIS — J9811 Atelectasis: Secondary | ICD-10-CM | POA: Diagnosis not present

## 2019-06-23 DIAGNOSIS — G9341 Metabolic encephalopathy: Secondary | ICD-10-CM

## 2019-06-23 DIAGNOSIS — D500 Iron deficiency anemia secondary to blood loss (chronic): Secondary | ICD-10-CM | POA: Diagnosis not present

## 2019-06-23 DIAGNOSIS — Z79899 Other long term (current) drug therapy: Secondary | ICD-10-CM

## 2019-06-23 DIAGNOSIS — R29898 Other symptoms and signs involving the musculoskeletal system: Secondary | ICD-10-CM

## 2019-06-23 DIAGNOSIS — E785 Hyperlipidemia, unspecified: Secondary | ICD-10-CM | POA: Diagnosis present

## 2019-06-23 DIAGNOSIS — Z7952 Long term (current) use of systemic steroids: Secondary | ICD-10-CM

## 2019-06-23 DIAGNOSIS — Z8249 Family history of ischemic heart disease and other diseases of the circulatory system: Secondary | ICD-10-CM

## 2019-06-23 DIAGNOSIS — R7881 Bacteremia: Secondary | ICD-10-CM | POA: Diagnosis not present

## 2019-06-23 DIAGNOSIS — J189 Pneumonia, unspecified organism: Secondary | ICD-10-CM | POA: Diagnosis present

## 2019-06-23 DIAGNOSIS — R0902 Hypoxemia: Secondary | ICD-10-CM

## 2019-06-23 DIAGNOSIS — Z66 Do not resuscitate: Secondary | ICD-10-CM | POA: Diagnosis not present

## 2019-06-23 DIAGNOSIS — N184 Chronic kidney disease, stage 4 (severe): Secondary | ICD-10-CM | POA: Diagnosis present

## 2019-06-23 DIAGNOSIS — Z0189 Encounter for other specified special examinations: Secondary | ICD-10-CM

## 2019-06-23 DIAGNOSIS — R3912 Poor urinary stream: Secondary | ICD-10-CM | POA: Diagnosis present

## 2019-06-23 DIAGNOSIS — A419 Sepsis, unspecified organism: Secondary | ICD-10-CM

## 2019-06-23 DIAGNOSIS — R6520 Severe sepsis without septic shock: Secondary | ICD-10-CM | POA: Diagnosis not present

## 2019-06-23 DIAGNOSIS — Z9079 Acquired absence of other genital organ(s): Secondary | ICD-10-CM

## 2019-06-23 DIAGNOSIS — E1122 Type 2 diabetes mellitus with diabetic chronic kidney disease: Secondary | ICD-10-CM | POA: Diagnosis not present

## 2019-06-23 DIAGNOSIS — I313 Pericardial effusion (noninflammatory): Secondary | ICD-10-CM | POA: Diagnosis present

## 2019-06-23 DIAGNOSIS — J90 Pleural effusion, not elsewhere classified: Secondary | ICD-10-CM

## 2019-06-23 DIAGNOSIS — Z4659 Encounter for fitting and adjustment of other gastrointestinal appliance and device: Secondary | ICD-10-CM

## 2019-06-23 DIAGNOSIS — E870 Hyperosmolality and hypernatremia: Secondary | ICD-10-CM | POA: Diagnosis present

## 2019-06-23 DIAGNOSIS — Z800 Family history of malignant neoplasm of digestive organs: Secondary | ICD-10-CM

## 2019-06-23 DIAGNOSIS — K922 Gastrointestinal hemorrhage, unspecified: Secondary | ICD-10-CM | POA: Diagnosis present

## 2019-06-23 DIAGNOSIS — Z87891 Personal history of nicotine dependence: Secondary | ICD-10-CM

## 2019-06-23 DIAGNOSIS — N183 Chronic kidney disease, stage 3 unspecified: Secondary | ICD-10-CM | POA: Diagnosis present

## 2019-06-23 DIAGNOSIS — D649 Anemia, unspecified: Secondary | ICD-10-CM

## 2019-06-23 DIAGNOSIS — D62 Acute posthemorrhagic anemia: Secondary | ICD-10-CM | POA: Diagnosis present

## 2019-06-23 DIAGNOSIS — A4181 Sepsis due to Enterococcus: Principal | ICD-10-CM | POA: Diagnosis present

## 2019-06-23 DIAGNOSIS — E871 Hypo-osmolality and hyponatremia: Secondary | ICD-10-CM | POA: Diagnosis not present

## 2019-06-23 DIAGNOSIS — E1142 Type 2 diabetes mellitus with diabetic polyneuropathy: Secondary | ICD-10-CM | POA: Diagnosis present

## 2019-06-23 DIAGNOSIS — C61 Malignant neoplasm of prostate: Secondary | ICD-10-CM | POA: Diagnosis present

## 2019-06-23 DIAGNOSIS — N179 Acute kidney failure, unspecified: Secondary | ICD-10-CM | POA: Diagnosis present

## 2019-06-23 DIAGNOSIS — Z8042 Family history of malignant neoplasm of prostate: Secondary | ICD-10-CM

## 2019-06-23 DIAGNOSIS — Z7189 Other specified counseling: Secondary | ICD-10-CM | POA: Diagnosis not present

## 2019-06-23 DIAGNOSIS — J810 Acute pulmonary edema: Secondary | ICD-10-CM | POA: Diagnosis present

## 2019-06-23 DIAGNOSIS — Z20822 Contact with and (suspected) exposure to covid-19: Secondary | ICD-10-CM | POA: Diagnosis present

## 2019-06-23 DIAGNOSIS — Z515 Encounter for palliative care: Secondary | ICD-10-CM | POA: Diagnosis not present

## 2019-06-23 DIAGNOSIS — B964 Proteus (mirabilis) (morganii) as the cause of diseases classified elsewhere: Secondary | ICD-10-CM | POA: Diagnosis present

## 2019-06-23 DIAGNOSIS — C7951 Secondary malignant neoplasm of bone: Secondary | ICD-10-CM | POA: Diagnosis present

## 2019-06-23 DIAGNOSIS — M79605 Pain in left leg: Secondary | ICD-10-CM | POA: Diagnosis present

## 2019-06-23 DIAGNOSIS — E43 Unspecified severe protein-calorie malnutrition: Secondary | ICD-10-CM | POA: Diagnosis present

## 2019-06-23 DIAGNOSIS — Z6832 Body mass index (BMI) 32.0-32.9, adult: Secondary | ICD-10-CM

## 2019-06-23 DIAGNOSIS — N131 Hydronephrosis with ureteral stricture, not elsewhere classified: Secondary | ICD-10-CM | POA: Diagnosis present

## 2019-06-23 DIAGNOSIS — D631 Anemia in chronic kidney disease: Secondary | ICD-10-CM | POA: Diagnosis present

## 2019-06-23 DIAGNOSIS — R310 Gross hematuria: Secondary | ICD-10-CM | POA: Diagnosis present

## 2019-06-23 DIAGNOSIS — R1310 Dysphagia, unspecified: Secondary | ICD-10-CM | POA: Diagnosis not present

## 2019-06-23 DIAGNOSIS — R52 Pain, unspecified: Secondary | ICD-10-CM | POA: Diagnosis not present

## 2019-06-23 DIAGNOSIS — Z7984 Long term (current) use of oral hypoglycemic drugs: Secondary | ICD-10-CM

## 2019-06-23 DIAGNOSIS — R9431 Abnormal electrocardiogram [ECG] [EKG]: Secondary | ICD-10-CM | POA: Diagnosis present

## 2019-06-23 DIAGNOSIS — E119 Type 2 diabetes mellitus without complications: Secondary | ICD-10-CM | POA: Diagnosis not present

## 2019-06-23 DIAGNOSIS — D6489 Other specified anemias: Secondary | ICD-10-CM | POA: Diagnosis present

## 2019-06-23 DIAGNOSIS — R509 Fever, unspecified: Secondary | ICD-10-CM | POA: Diagnosis not present

## 2019-06-23 DIAGNOSIS — E861 Hypovolemia: Secondary | ICD-10-CM | POA: Diagnosis not present

## 2019-06-23 DIAGNOSIS — N320 Bladder-neck obstruction: Secondary | ICD-10-CM | POA: Diagnosis present

## 2019-06-23 DIAGNOSIS — K2971 Gastritis, unspecified, with bleeding: Secondary | ICD-10-CM | POA: Diagnosis not present

## 2019-06-23 DIAGNOSIS — R609 Edema, unspecified: Secondary | ICD-10-CM | POA: Diagnosis not present

## 2019-06-23 DIAGNOSIS — K8020 Calculus of gallbladder without cholecystitis without obstruction: Secondary | ICD-10-CM | POA: Diagnosis present

## 2019-06-23 DIAGNOSIS — C779 Secondary and unspecified malignant neoplasm of lymph node, unspecified: Secondary | ICD-10-CM | POA: Diagnosis present

## 2019-06-23 DIAGNOSIS — I081 Rheumatic disorders of both mitral and tricuspid valves: Secondary | ICD-10-CM | POA: Diagnosis present

## 2019-06-23 DIAGNOSIS — C787 Secondary malignant neoplasm of liver and intrahepatic bile duct: Secondary | ICD-10-CM | POA: Diagnosis not present

## 2019-06-23 DIAGNOSIS — I129 Hypertensive chronic kidney disease with stage 1 through stage 4 chronic kidney disease, or unspecified chronic kidney disease: Secondary | ICD-10-CM | POA: Diagnosis present

## 2019-06-23 DIAGNOSIS — C799 Secondary malignant neoplasm of unspecified site: Secondary | ICD-10-CM | POA: Diagnosis not present

## 2019-06-23 DIAGNOSIS — E876 Hypokalemia: Secondary | ICD-10-CM | POA: Diagnosis present

## 2019-06-23 DIAGNOSIS — R0600 Dyspnea, unspecified: Secondary | ICD-10-CM

## 2019-06-23 DIAGNOSIS — M542 Cervicalgia: Secondary | ICD-10-CM

## 2019-06-23 DIAGNOSIS — E8770 Fluid overload, unspecified: Secondary | ICD-10-CM | POA: Diagnosis present

## 2019-06-23 DIAGNOSIS — R000 Tachycardia, unspecified: Secondary | ICD-10-CM | POA: Diagnosis present

## 2019-06-23 LAB — COMPREHENSIVE METABOLIC PANEL
ALT: 28 U/L (ref 0–44)
AST: 33 U/L (ref 15–41)
Albumin: 2.2 g/dL — ABNORMAL LOW (ref 3.5–5.0)
Alkaline Phosphatase: 224 U/L — ABNORMAL HIGH (ref 38–126)
Anion gap: 12 (ref 5–15)
BUN: 59 mg/dL — ABNORMAL HIGH (ref 6–20)
CO2: 27 mmol/L (ref 22–32)
Calcium: 8.7 mg/dL — ABNORMAL LOW (ref 8.9–10.3)
Chloride: 100 mmol/L (ref 98–111)
Creatinine, Ser: 3.54 mg/dL — ABNORMAL HIGH (ref 0.61–1.24)
GFR calc Af Amer: 21 mL/min — ABNORMAL LOW (ref >60)
GFR calc non Af Amer: 18 mL/min — ABNORMAL LOW (ref >60)
Glucose, Bld: 182 mg/dL — ABNORMAL HIGH (ref 70–99)
Potassium: 4.6 mmol/L (ref 3.5–5.1)
Sodium: 139 mmol/L (ref 135–145)
Total Bilirubin: 0.8 mg/dL (ref 0.3–1.2)
Total Protein: 7.3 g/dL (ref 6.5–8.1)

## 2019-06-23 LAB — BLOOD GAS, VENOUS
Acid-Base Excess: 3.1 mmol/L — ABNORMAL HIGH (ref 0.0–2.0)
Bicarbonate: 28.2 mmol/L — ABNORMAL HIGH (ref 20.0–28.0)
O2 Saturation: 40 %
Patient temperature: 98.6
pCO2, Ven: 51.3 mmHg (ref 44.0–60.0)
pH, Ven: 7.36 (ref 7.250–7.430)
pO2, Ven: <31 mmHg — CL (ref 32.0–45.0)

## 2019-06-23 LAB — URINALYSIS, ROUTINE W REFLEX MICROSCOPIC
Bilirubin Urine: NEGATIVE
Glucose, UA: NEGATIVE mg/dL
Ketones, ur: NEGATIVE mg/dL
Nitrite: NEGATIVE
Protein, ur: 30 mg/dL — AB
Specific Gravity, Urine: 1.006 (ref 1.005–1.030)
WBC, UA: >50 WBC/hpf — ABNORMAL HIGH (ref 0–5)
pH: 6 (ref 5.0–8.0)

## 2019-06-23 LAB — RAPID URINE DRUG SCREEN, HOSP PERFORMED
Amphetamines: NOT DETECTED
Barbiturates: NOT DETECTED
Benzodiazepines: NOT DETECTED
Cocaine: NOT DETECTED
Opiates: POSITIVE — AB
Tetrahydrocannabinol: NOT DETECTED

## 2019-06-23 LAB — CBC WITH DIFFERENTIAL/PLATELET
Abs Immature Granulocytes: 0.32 10*3/uL — ABNORMAL HIGH (ref 0.00–0.07)
Basophils Absolute: 0 10*3/uL (ref 0.0–0.1)
Basophils Relative: 0 %
Eosinophils Absolute: 0 10*3/uL (ref 0.0–0.5)
Eosinophils Relative: 0 %
HCT: 22.6 % — ABNORMAL LOW (ref 39.0–52.0)
Hemoglobin: 6.4 g/dL — CL (ref 13.0–17.0)
Immature Granulocytes: 3 %
Lymphocytes Relative: 12 %
Lymphs Abs: 1.4 10*3/uL (ref 0.7–4.0)
MCH: 25.1 pg — ABNORMAL LOW (ref 26.0–34.0)
MCHC: 28.3 g/dL — ABNORMAL LOW (ref 30.0–36.0)
MCV: 88.6 fL (ref 80.0–100.0)
Monocytes Absolute: 0.8 10*3/uL (ref 0.1–1.0)
Monocytes Relative: 6 %
Neutro Abs: 9.3 10*3/uL — ABNORMAL HIGH (ref 1.7–7.7)
Neutrophils Relative %: 79 %
Platelets: 271 10*3/uL (ref 150–400)
RBC: 2.55 MIL/uL — ABNORMAL LOW (ref 4.22–5.81)
RDW: 19.4 % — ABNORMAL HIGH (ref 11.5–15.5)
WBC: 11.8 10*3/uL — ABNORMAL HIGH (ref 4.0–10.5)
nRBC: 0 % (ref 0.0–0.2)

## 2019-06-23 LAB — POC OCCULT BLOOD, ED: Fecal Occult Bld: POSITIVE — AB

## 2019-06-23 LAB — ETHANOL: Alcohol, Ethyl (B): <10 mg/dL (ref <10)

## 2019-06-23 LAB — LACTIC ACID, PLASMA
Lactic Acid, Venous: 1 mmol/L (ref 0.5–1.9)
Lactic Acid, Venous: 0.8 mmol/L (ref 0.5–1.9)

## 2019-06-23 LAB — PREPARE RBC (CROSSMATCH)

## 2019-06-23 LAB — AMMONIA: Ammonia: 14 umol/L (ref 9–35)

## 2019-06-23 IMAGING — CT CT RENAL STONE PROTOCOL
2 of 3 series · 16 of 46 positions shown, 18 images · non-contrast
Comparison: [DATE]

CLINICAL DATA: History of prostate carcinoma with renal failure

EXAM:
CT ABDOMEN AND PELVIS WITHOUT CONTRAST
TECHNIQUE: Multidetector CT imaging of the abdomen and pelvis was performed
following the standard protocol without IV contrast.

[Series 2: lung bases · axial · 0.89mm/px · z∈[-151,-77]mm · 13 of 43 slices shown, 15 images]
[im 3/43  soft-tissue]
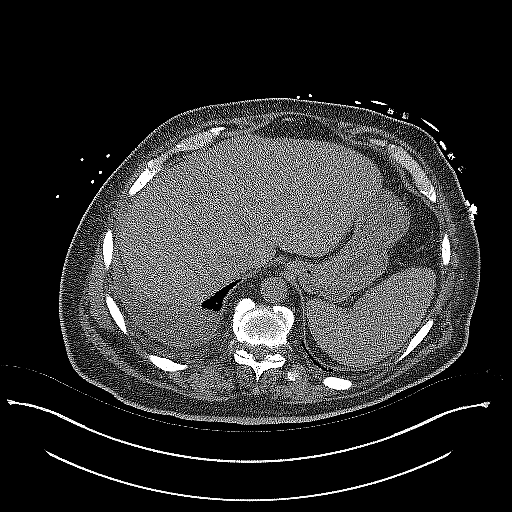
[im 3/43  bone]
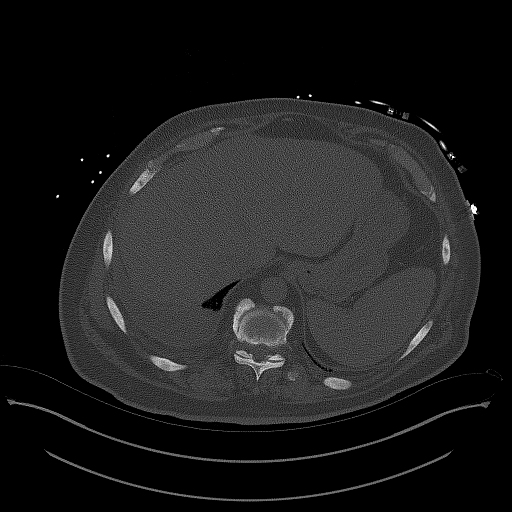
[im 6/43  soft-tissue]
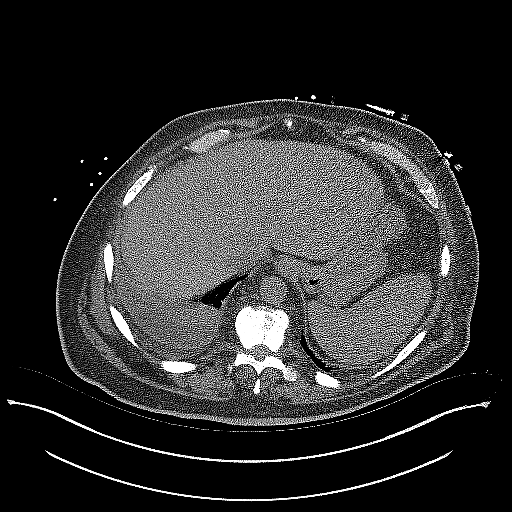
[im 9/43  soft-tissue]
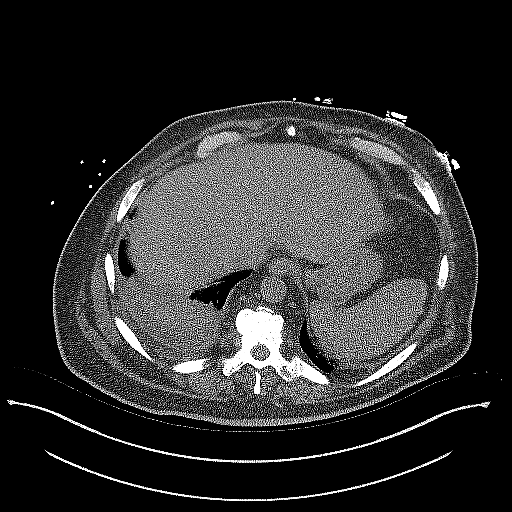
[im 13/43  soft-tissue]
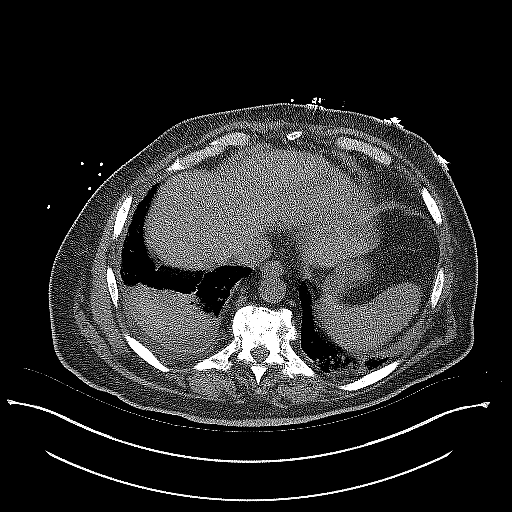
[im 15/43  soft-tissue]
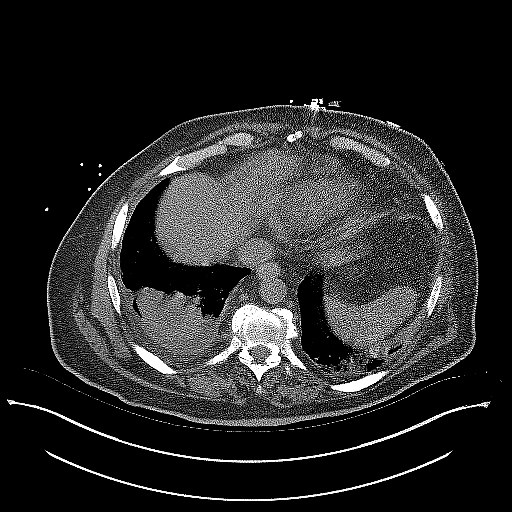
[im 18/43  soft-tissue]
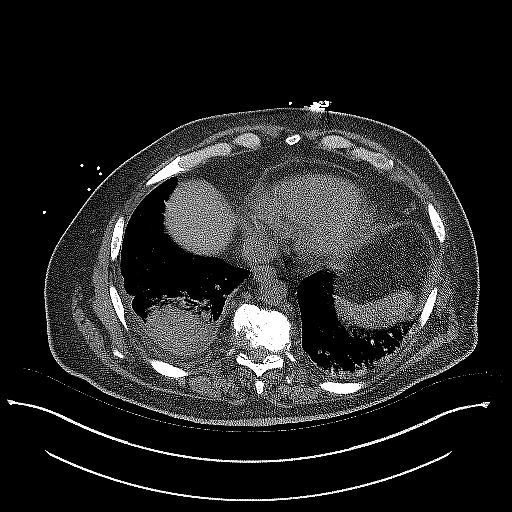
[im 22/43  soft-tissue]
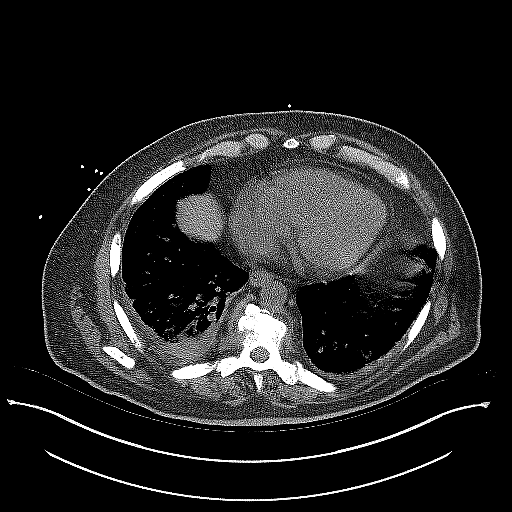
[im 25/43  soft-tissue]
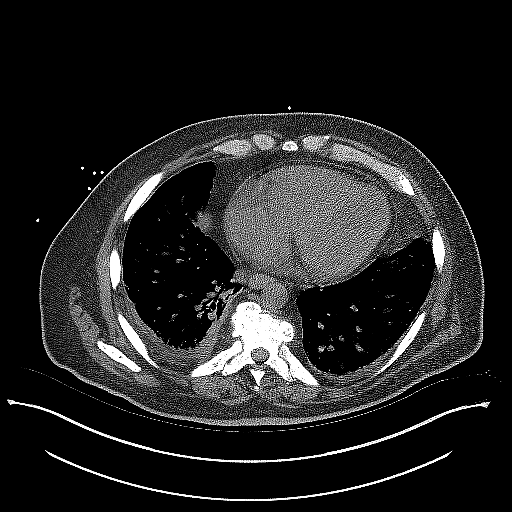
[im 28/43  soft-tissue]
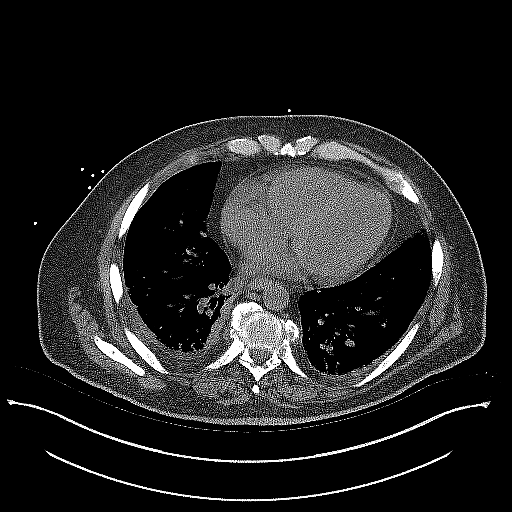
[im 28/43  bone]
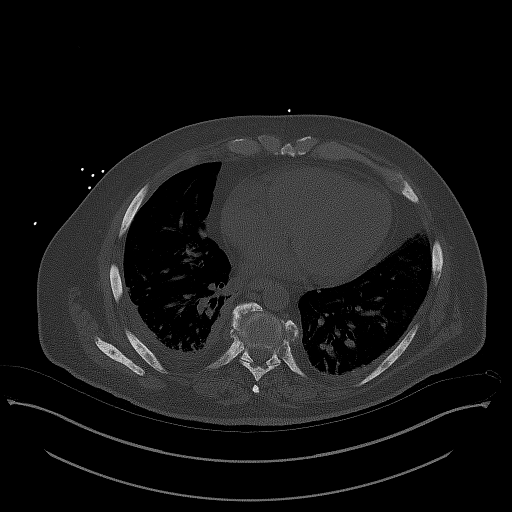
[im 30/43  soft-tissue]
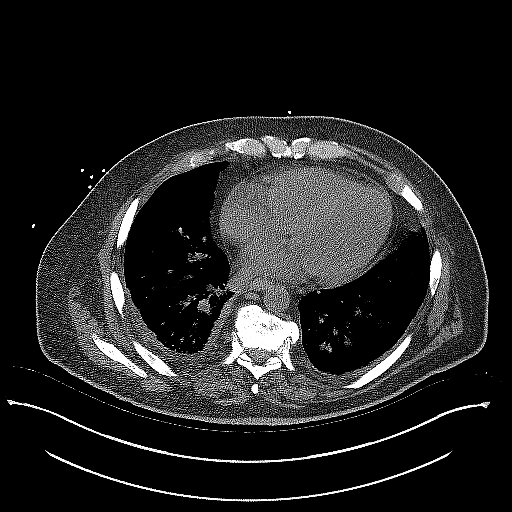
[im 34/43  soft-tissue]
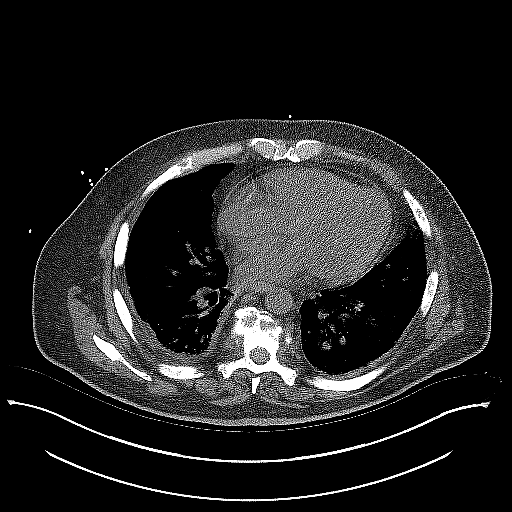
[im 37/43  soft-tissue]
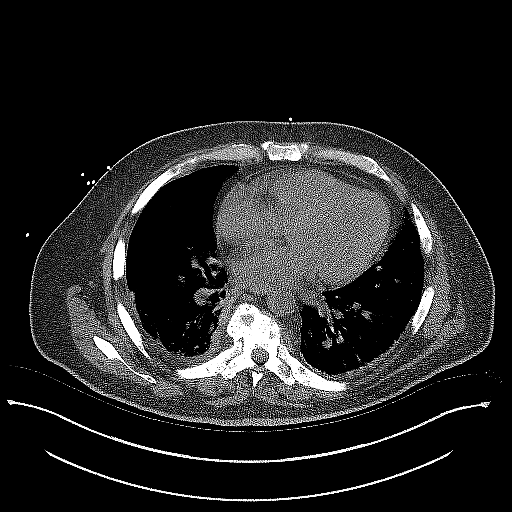
[im 40/43  soft-tissue]
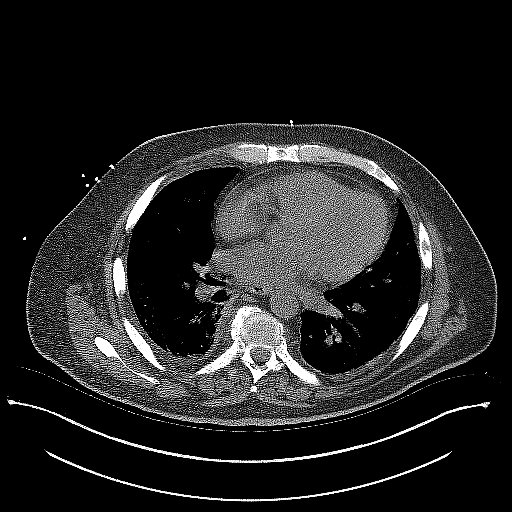

[Series 5: coronal · coronal · 0.82mm/px · 3 of 151 slices shown]
[im 51/151  soft-tissue]
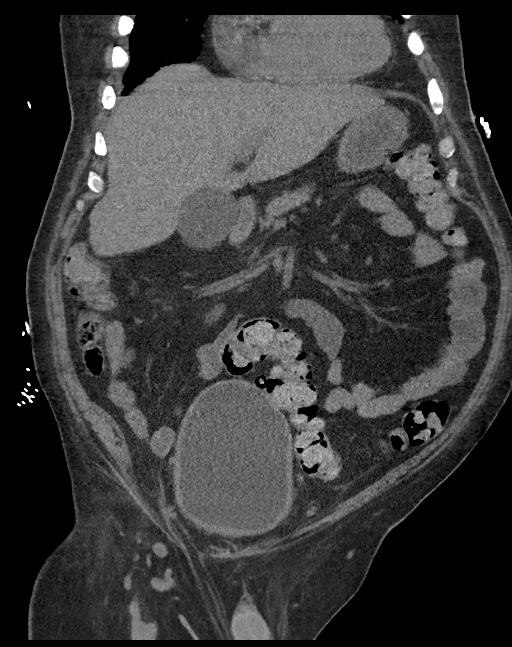
[im 67/151  soft-tissue]
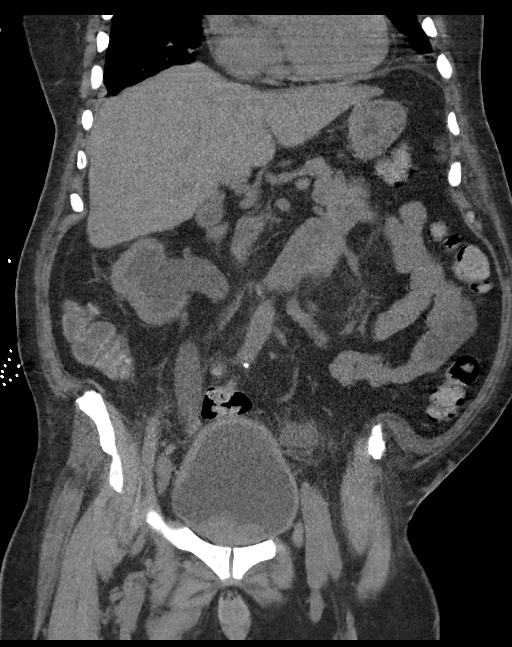
[im 84/151  soft-tissue]
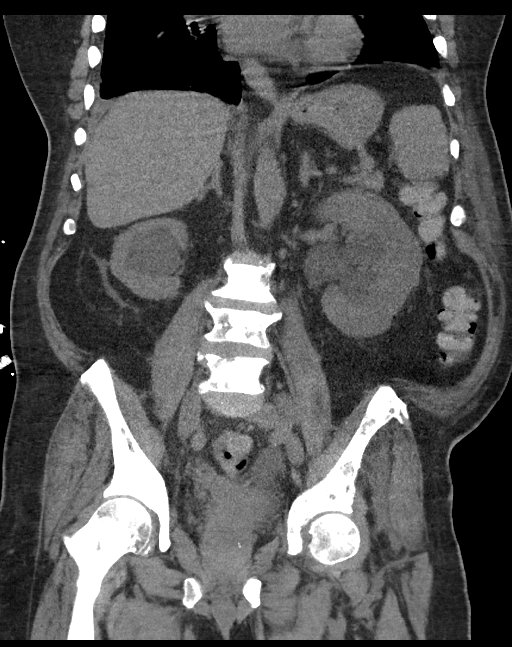

[16 of 46 positions shown; findings below may reference images not displayed]

FINDINGS: Lower chest: Lung bases demonstrate small left pleural effusion and
consolidation improved when compared with the prior exam. No new
focal infiltrate is seen. Decreased attenuation is noted in the
cardiac blood pool suggestive of anemia.

Hepatobiliary: Liver is within normal limits. The gallbladder is
well distended with a single dependent gallstone in the gallbladder
neck. No pericholecystic fluid is seen.

Pancreas: Unremarkable. No pancreatic ductal dilatation or
surrounding inflammatory changes.

Spleen: Normal in size without focal abnormality.

Adrenals/Urinary Tract: Adrenal glands are within normal limits
bilaterally. Severe hydronephrosis is noted bilaterally. This
extends to the level of the urinary bladder. The bladder is
partially distended. Considerable in growth of the known prostate
carcinoma is noted inferiorly contributing to the hydronephrosis.
Bladder wall thickening is noted likely related to a degree of
bladder outlet obstruction.

Stomach/Bowel: The appendix is within normal limits. The colon shows
no obstructive or inflammatory changes. No small bowel or gastric
abnormality is noted.

Vascular/Lymphatic: Atherosclerotic calcifications of the aorta and
iliac vessels are seen. No sizable lymphadenopathy is noted.

Reproductive: Prostate again demonstrates in growth of tumor into
the inferior aspect of bladder. This appears increased when compared
with the prior exam measuring approximately 6.3 x 4.4 cm.

Other: No abdominal wall hernia or abnormality. No abdominopelvic
ascites.

Musculoskeletal: Sclerotic bony metastatic disease is noted
IMPRESSION: Enlarging prostate neoplasm with further ingrowth into the inferior
aspect of the bladder with resulting severe hydronephrosis
bilaterally. The degree of hydronephrosis is relatively stable when
compared with the prior exam.

Cholelithiasis.

Right lower lobe consolidation with right-sided effusion improved
from the prior exam.

No other focal abnormality is noted.

## 2019-06-23 IMAGING — CT CT HEAD W/O CM
4 series · 17 of 47 positions shown, 19 images · non-contrast
Comparison: MRI [DATE]

CLINICAL DATA: Delirium.  History of prostate cancer and seizures.

EXAM:
CT HEAD WITHOUT CONTRAST
TECHNIQUE: Contiguous axial images were obtained from the base of the skull
through the vertex without intravenous contrast.

[Series 2: head wo · axial · 0.43mm/px · z∈[+1551,+1671]mm · 7 of 34 slices shown, 9 images]
[im 5/34  brain]
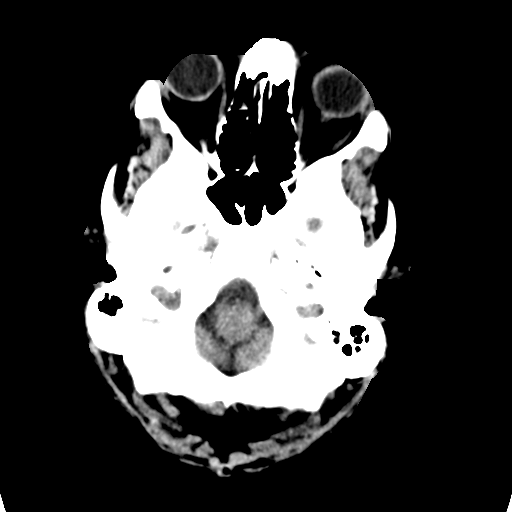
[im 5/34  bone]
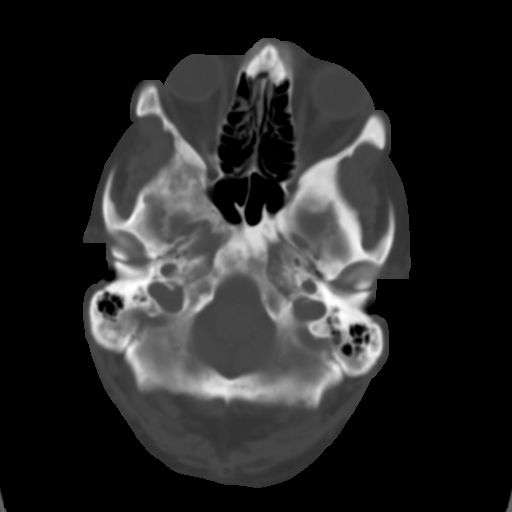
[im 9/34  brain]
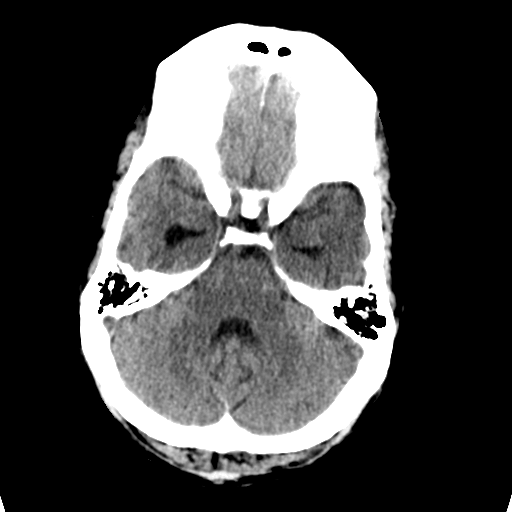
[im 13/34  brain]
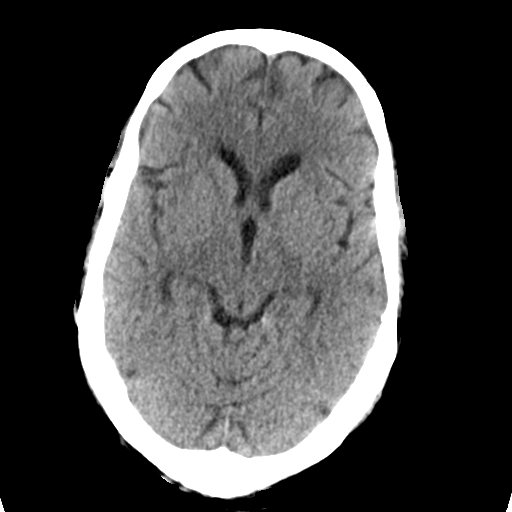
[im 17/34  brain]
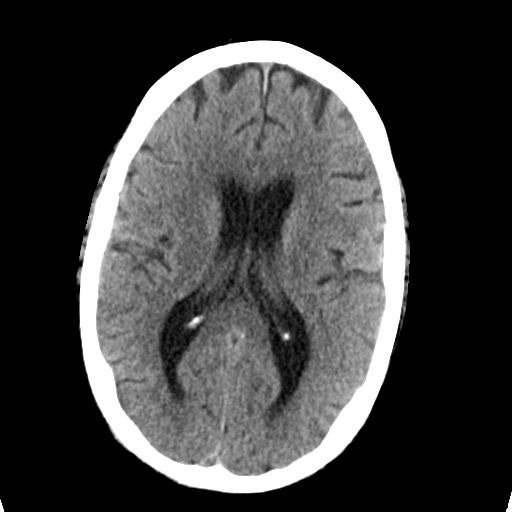
[im 21/34  brain]
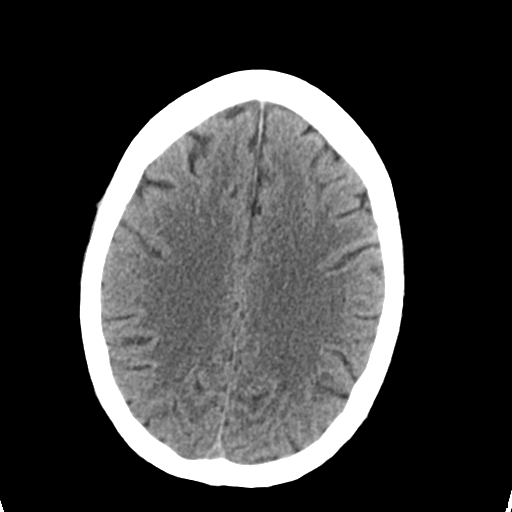
[im 21/34  bone]
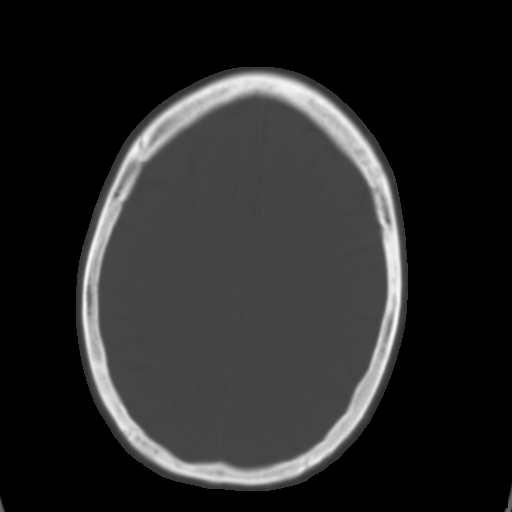
[im 25/34  brain]
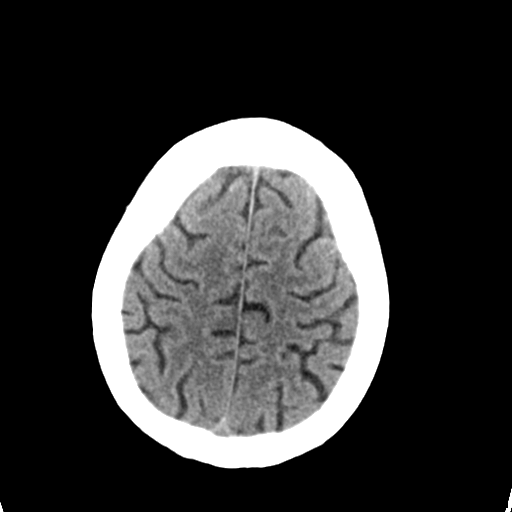
[im 29/34  brain]
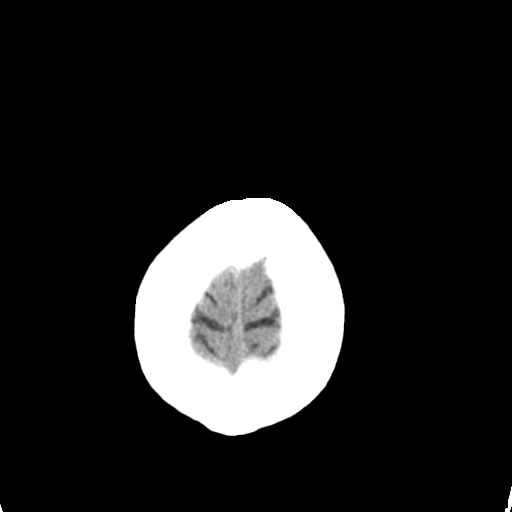

[Series 3: head bone · axial · 0.43mm/px · z∈[+1547,+1603]mm · 4 of 83 slices shown]
[im 9/83  bone]
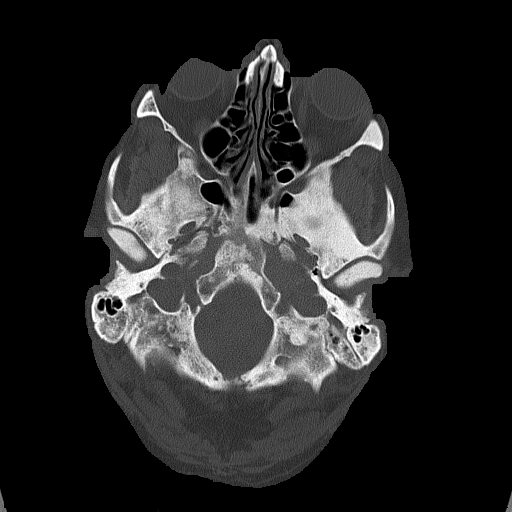
[im 17/83  bone]
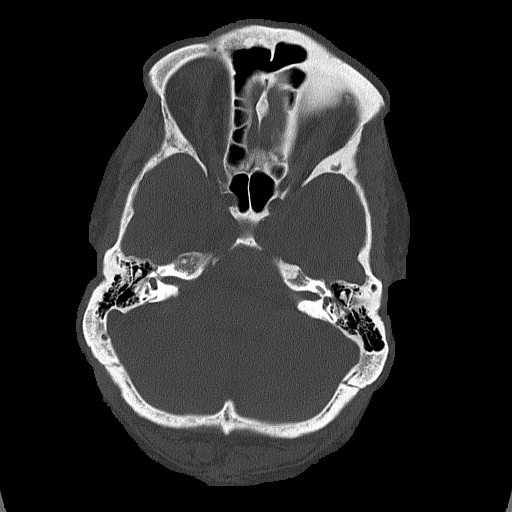
[im 25/83  bone]
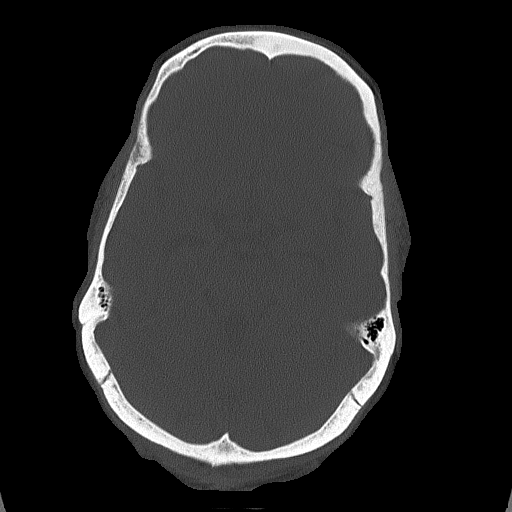
[im 37/83  bone]
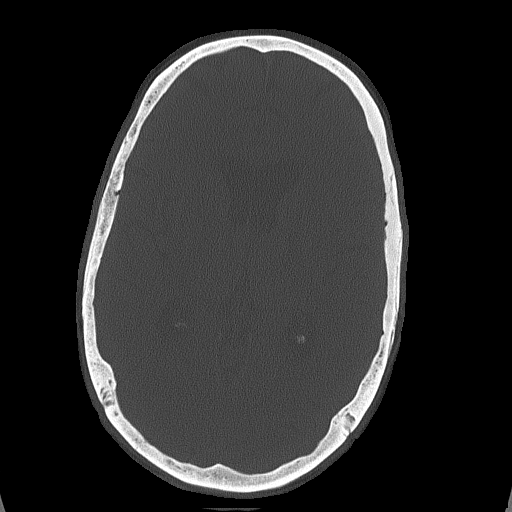

[Series 4: coronal soft tissue · coronal · 0.30mm/px · 3 of 69 slices shown]
[im 23/69  brain]
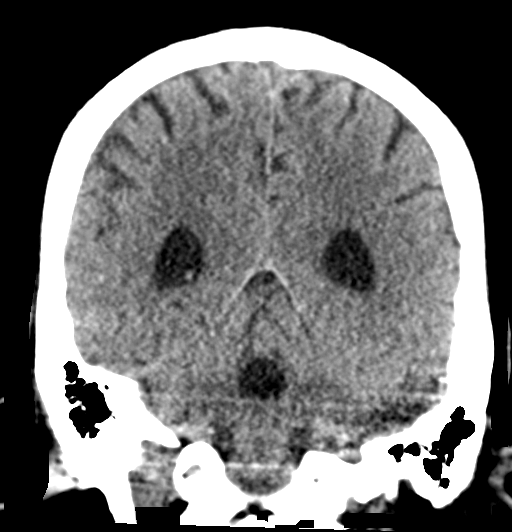
[im 31/69  brain]
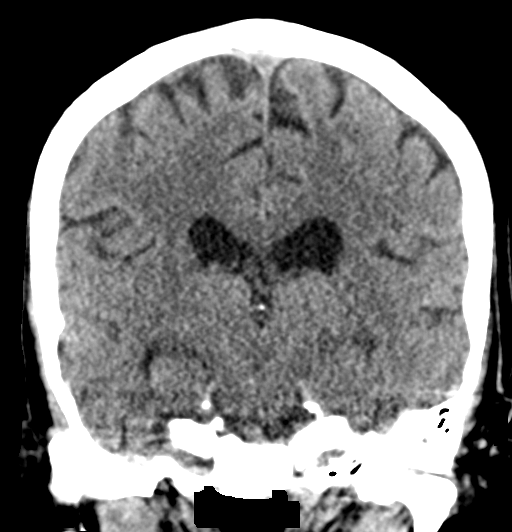
[im 38/69  brain]
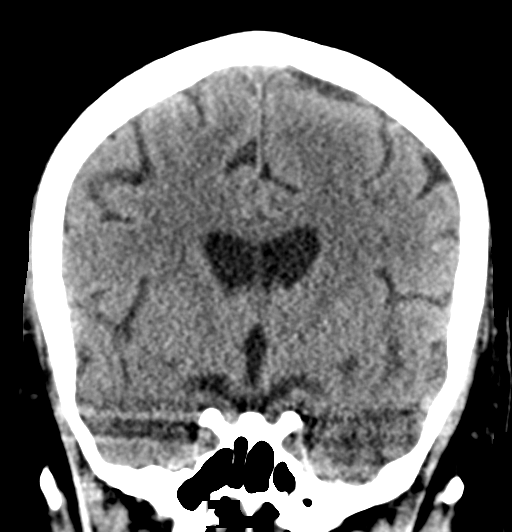

[Series 5: sagittal soft tissue · sagittal · 0.31mm/px · 3 of 51 slices shown]
[im 17/51  brain]
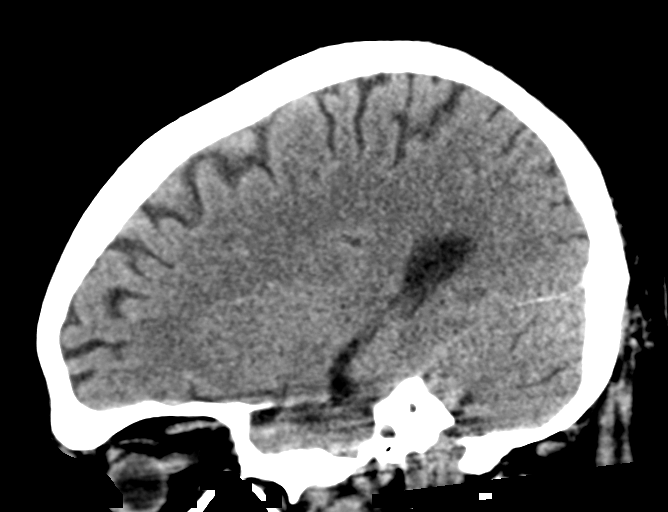
[im 26/51  brain]
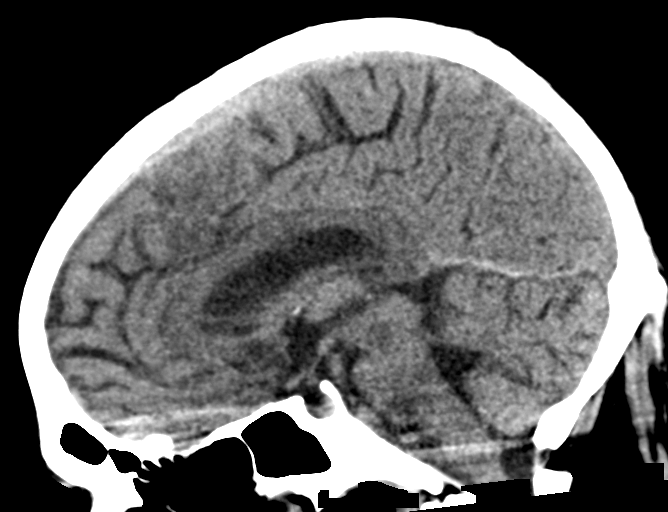
[im 34/51  brain]
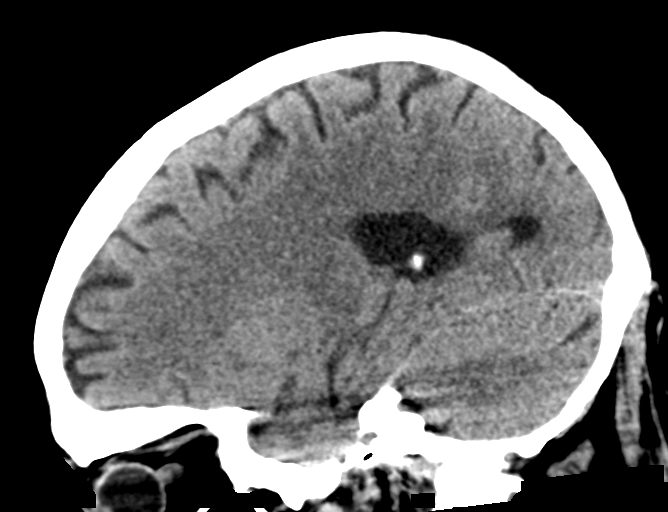

[17 of 47 positions shown; findings below may reference images not displayed]

FINDINGS: Brain: The brain shows a normal appearance without evidence of
malformation, atrophy, old or acute small or large vessel
infarction, mass lesion, hemorrhage, hydrocephalus or extra-axial
collection.

Vascular: No hyperdense vessel. No evidence of atherosclerotic
calcification.

Skull: Sclerotic and mixed lytic changes of C1, the skull base and
calvarium consistent with the patient's known osseous metastatic
disease.

Sinuses/Orbits: Sinuses are clear. Orbits appear normal. Mastoids
are clear.

Other: None significant
IMPRESSION: Normal appearance of the brain itself. Known metastatic disease of
the bone.

## 2019-06-23 IMAGING — CR DG CHEST 2V
2 series · 2 of 2 positions shown · non-contrast
Comparison: Most recent radiograph [DATE]

CLINICAL DATA: Confusion. Decreased oxygen saturation. Weakness.
History of prostate cancer.

EXAM:
CHEST - 2 VIEW

[w chest lat]
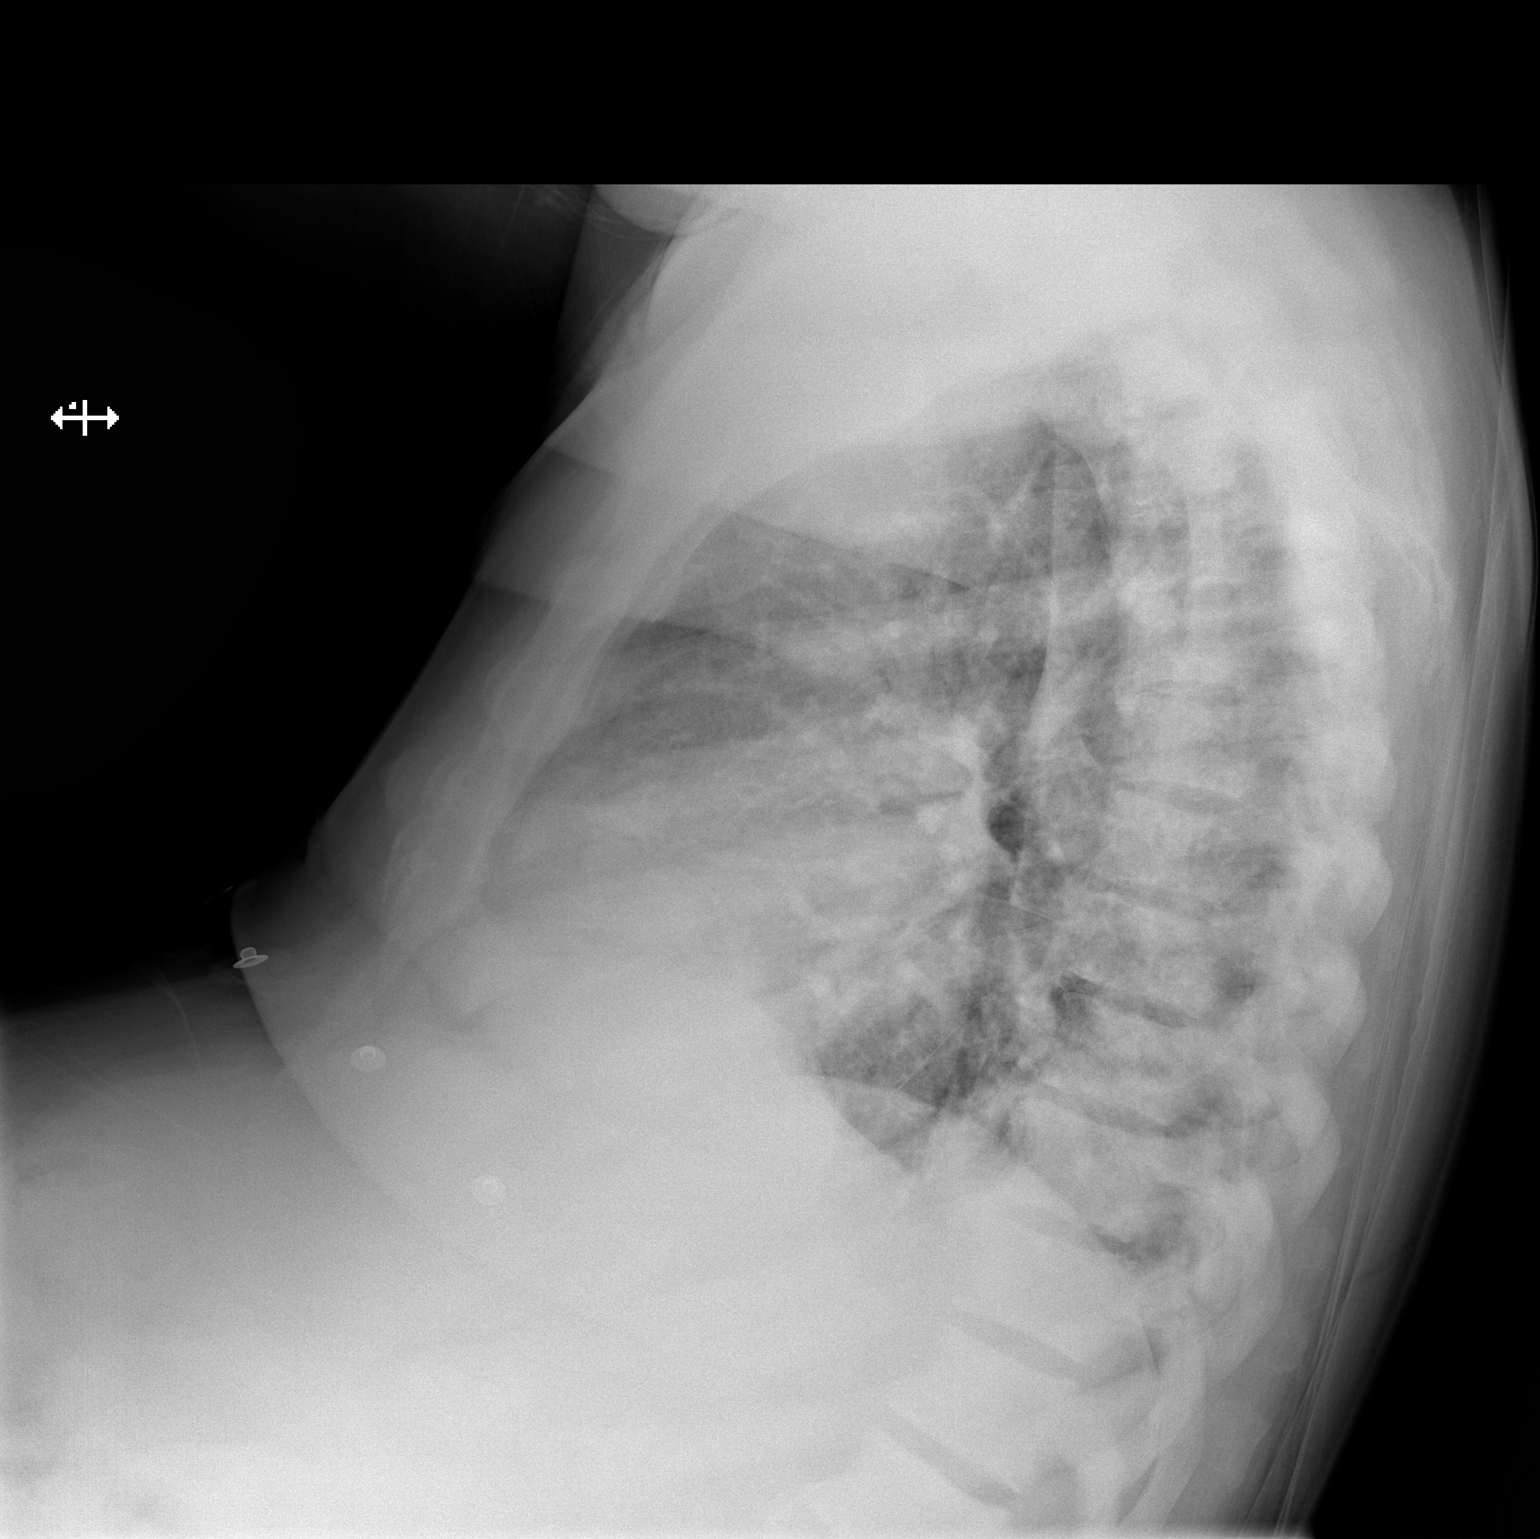

[x chest ap]
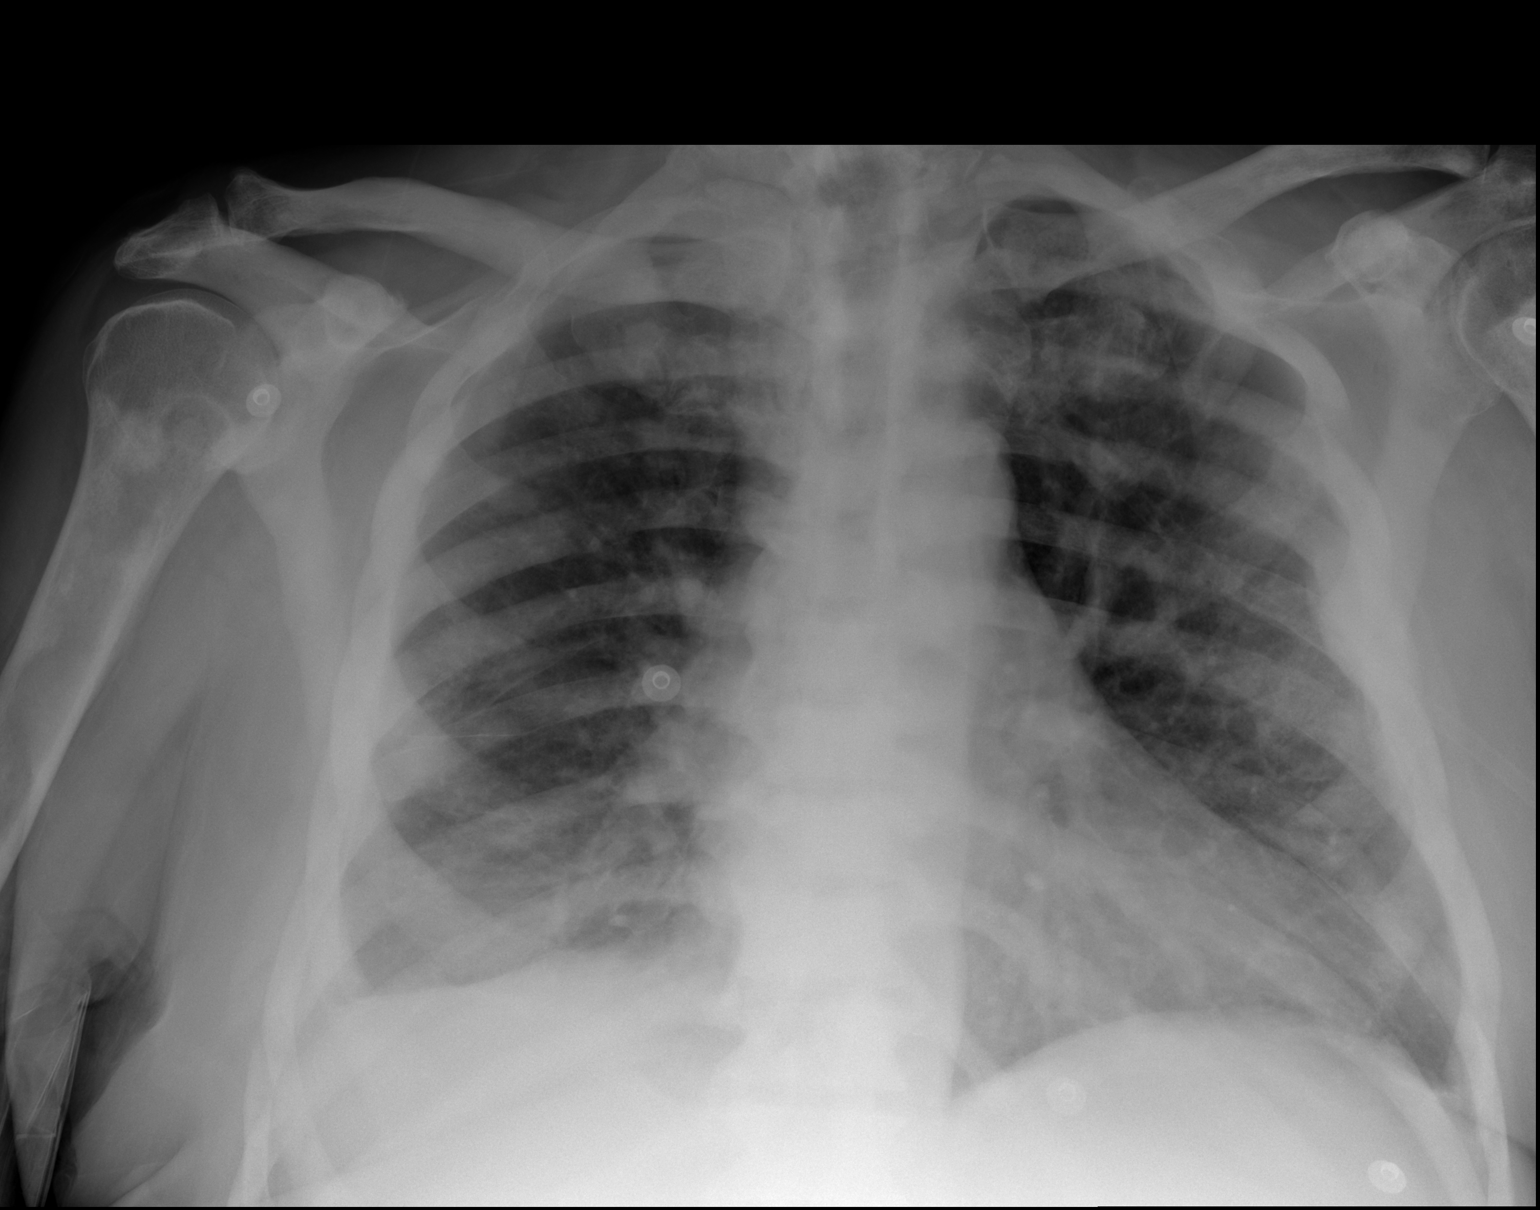

[2 of 2 positions shown; findings below may reference images not displayed]

FINDINGS: Upper normal heart size with unchanged mediastinal contours.
Decreased right pleural effusion from prior exam. No definite acute
airspace disease, blastic osseous lesions partially obscure
evaluation of the underlying pulmonary parenchyma. No pulmonary
edema. No pneumothorax. Multifocal blastic osseous metastatic
disease, as seen on prior exam. Prior left fifth and sixth rib
fractures.
IMPRESSION: 1. Decreased right pleural effusion from prior exam. No definite
acute abnormality.
2. Multifocal blastic osseous metastatic disease.

## 2019-06-23 MED ORDER — SODIUM CHLORIDE 0.9 % IV BOLUS
500.0000 mL | Freq: Once | INTRAVENOUS | Status: AC
  Administered 2019-06-23: 500 mL via INTRAVENOUS

## 2019-06-23 MED ORDER — SODIUM CHLORIDE 0.9 % IV SOLN: 10.0000 mL/h | Freq: Once | INTRAVENOUS | Status: DC

## 2019-06-23 MED ORDER — SODIUM CHLORIDE 0.9 % IV SOLN: INTRAVENOUS | Status: DC

## 2019-06-23 MED ORDER — SODIUM CHLORIDE 0.9 % IV SOLN
1.0000 g | Freq: Once | INTRAVENOUS | Status: AC
  Administered 2019-06-23: 1 g via INTRAVENOUS
  Filled 2019-06-23: qty 10

## 2019-06-23 NOTE — H&P (Signed)
History and Physical   Jeffery [name] NPY:[id number] DOB: 04-01-1961 DOA: 06/19/2019  Referring MD/NP/PA: Dr. Eulis Foster  PCP: [name], MD   Outpatient Specialists: Dr. Alen Blew, oncology  Patient coming from: Home  Chief Complaint: Shortness of breath and palpitation  HPI: Jeffery [name] is a 59 y.o. male with medical history significant of metastatic prostate cancer, diabetes, hyperlipidemia who presented to the ER with generalized weakness hypoxia and tachycardia.  Patient also reported painful urination.  Patient was seen and evaluated.  Was found to have significant anemia and guaiac positive stools.  Patient has been feelings this week and contacted his oncologist who referred him to the ER after home health visit.  He denied any NSAIDs use.  Denied any recent GI work-up.  His hemoglobin was found to be around 6 in the ER.  He is tremulous but denied any fever or chills.  Patient was also found to be in acute kidney injury.  He has been treated for his prostate cancer and continues to follow-up with oncology.  Patient also being seen by palliative care.  We will admit the patient for treatment of anemia and acute kidney injury.  ED Course: Temperature is 100.6 blood pressure 139/128 pulse 140 respirate of 29 oxygen sat 95% room air.  White count 11.8 hemoglobin 6.4 and platelets 271.  Sodium 139 potassium 4.6 chloride 100 CO2 27, BUN 59 and creatinine 3.54 calcium 8.7 and glucose 182.  Patient initiated on blood transfusion and being admitted to the hospital for treatment.  Review of Systems: As per HPI otherwise 10 point review of systems negative.    Past Medical History:  Diagnosis Date  . Cancer (Jeffery [name])    stage IV prostate cancer per patient  . Diabetes mellitus, new onset ([place]) 07/2016  . Enlarged prostate   . HLD (hyperlipidemia)     Past Surgical History:  Procedure Laterality Date  . I & D EXTREMITY Left 01/27/2019   Procedure: IRRIGATION AND DEBRIDEMENT  EXTREMITY;  Surgeon: Iran Planas, MD;  Location: [facility];  Service: Orthopedics;  Laterality: Left;  . KNEE ARTHROSCOPY Right   . ORCHIECTOMY Bilateral 10/12/2018   Procedure: ORCHIECTOMY;  Surgeon: Lucas Mallow, MD;  Location: [facility];  Service: Urology;  Laterality: Bilateral;  . TRANSURETHRAL RESECTION OF PROSTATE  10/12/2018   Procedure: CYSTOSCOPY WITH URETHRAL DILATION;  Surgeon: Lucas Mallow, MD;  Location: [facility];  Service: Urology;;     reports that he quit smoking about 6 years ago. His smoking use included cigarettes. He has a 63.00 pack-year smoking history. He has never used smokeless tobacco. He reports that he does not drink alcohol or use drugs.  No Known Allergies  Family History  Problem Relation Age of Onset  . Prostate cancer Father   . Heart disease Father   . Prostate cancer Paternal Grandfather      Prior to Admission medications   Medication Sig Start Date End Date Taking? Authorizing Provider  Amino Acids-Protein Hydrolys (FEEDING SUPPLEMENT, PRO-STAT SUGAR FREE 64,) LIQD Take 30 mLs by mouth 3 (three) times daily. 10/14/18  Yes Hongalgi, Lenis Dickinson, MD  feeding supplement, ENSURE ENLIVE, (ENSURE ENLIVE) LIQD Take 237 mLs by mouth daily. 10/14/18  Yes Hongalgi, Lenis Dickinson, MD  finasteride (PROSCAR) 5 MG tablet Take 5 mg by mouth daily. 02/12/18  Yes [provider]  furosemide (LASIX) 40 MG tablet Take 40 mg by mouth daily.   Yes [provider]  gabapentin (NEURONTIN) 300 MG  capsule Take 1-2 capsules (300-600 mg total) by mouth 3 (three) times daily. 322m in am and 6085min evening Patient taking differently: Take 600 mg by mouth 3 (three) times daily.  05/31/19  Yes Shadad, FiMathis DadMD  HYDROcodone-acetaminophen (NORCO/VICODIN) 5-325 MG tablet TAKE 1 TO 2 TABLETS BY MOUTH EVERY 4 TO 6 HOURS AS NEEDED FOR PAIN (MAX OF 8 TABLETS IN 24 HOURS) Patient taking differently: Take 1-2 tablets by mouth every 4 (four) hours as needed for moderate pain or  severe pain. TAKE 1 TO 2 TABLETS BY MOUTH EVERY 4 TO 6 HOURS AS NEEDED FOR PAIN (MAX OF 8 TABLETS IN 24 HOURS) 06/15/19  Yes ShWyatt PortelaMD  metFORMIN (GLUCOPHAGE-XR) 500 MG 24 hr tablet Take 500-1,000 mg by mouth 2 (two) times daily. Take 2 tablets (1000 mg) in the am and Take 1 tablet at bedtime 01/12/19  Yes [provider]  Multiple Vitamin (MULTIVITAMIN WITH MINERALS) TABS tablet Take 1 tablet by mouth daily. 10/15/18  Yes Hongalgi, AnLenis DickinsonMD  predniSONE (DELTASONE) 5 MG tablet Take 1 tablet (5 mg total) by mouth daily with breakfast. 11/03/18  Yes Shadad, FiMathis DadMD  tamsulosin (FLOMAX) 0.4 MG CAPS capsule Take 2 capsules (0.8 mg total) by mouth daily after supper. 10/14/18  Yes Hongalgi, AnLenis DickinsonMD  vitamin B-12 (CYANOCOBALAMIN) 1000 MCG tablet Take 1 tablet (1,000 mcg total) by mouth daily. 10/14/18  Yes Hongalgi, AnLenis DickinsonMD  ZYTIGA 250 MG tablet TAKE 4 TABLETS BY MOUTH ONCE DAILY AS DIRECTED.  TAKE 1 HOUR BEFORE OR 2 HOURS AFTER A MEAL Patient taking differently: Take 1,000 mg by mouth daily.  12/18/18  Yes ShWyatt PortelaMD  Blood Glucose Monitoring Suppl (RELION CONFIRM GLUCOSE MONITOR) w/Device KIT Use 4 times daily before meals and bedtime as directed. 07/31/16   StHolley RaringMD  glucose blood (RELION GLUCOSE TEST STRIPS) test strip Use as instructed 07/31/16   StHolley RaringMD  Lancets 30G MISC Use 4 times daily before meals as directed. 07/31/16   StHolley RaringMD    Physical Exam: Vitals:   06/24/2019 2203 07/11/2019 2300 06/24/2019 2330 07/06/2019 2345  BP: 122/80 (!) 139/128 115/73 116/70  Pulse: (!) 140 (!) 134 (!) 132 (!) 132  Resp: (!) 27 (!) 29 (!) 22 (!) 22  Temp:      TempSrc:      SpO2: 98% 99% 98% 98%      Constitutional: Chronically ill looking, in mild distress Vitals:   07/01/2019 2203 06/22/2019 2300 06/27/2019 2330 07/12/2019 2345  BP: 122/80 (!) 139/128 115/73 116/70  Pulse: (!) 140 (!) 134 (!) 132 (!) 132  Resp: (!) 27 (!) 29 (!) 22 (!) 22  Temp:       TempSrc:      SpO2: 98% 99% 98% 98%   Eyes: PERRL, lids and conjunctivae pale ENMT: Mucous membranes are moist. Posterior pharynx clear of any exudate or lesions.Normal dentition.  Neck: normal, supple, no masses, no thyromegaly Respiratory: clear to auscultation bilaterally, no wheezing, no crackles. Normal respiratory effort. No accessory muscle use.  Cardiovascular: Sinus tachycardia, no murmurs / rubs / gallops. No extremity edema. 2+ pedal pulses. No carotid bruits.  Abdomen: no tenderness, no masses palpated. No hepatosplenomegaly. Bowel sounds positive.  Musculoskeletal: no clubbing / cyanosis. No joint deformity upper and lower extremities. Good ROM, no contractures. Normal muscle tone.  Skin: no rashes, lesions, ulcers. No induration Neurologic: CN 2-12 grossly intact. Sensation intact, DTR normal. Strength  5/5 in all 4.  Psychiatric: Normal judgment and insight. Alert and oriented x 3.  Shaky and little drowsy.     Labs on Admission: I have personally reviewed following labs and imaging studies  CBC: Recent Labs  Lab 07/06/2019 1714  WBC 11.8*  NEUTROABS 9.3*  HGB 6.4*  HCT 22.6*  MCV 88.6  PLT 408   Basic Metabolic Panel: Recent Labs  Lab 07/07/2019 1714  NA 139  K 4.6  CL 100  CO2 27  GLUCOSE 182*  BUN 59*  CREATININE 3.54*  CALCIUM 8.7*   GFR: Estimated Creatinine Clearance: 27.6 mL/min (A) (by C-G formula based on SCr of 3.54 mg/dL (H)). Liver Function Tests: Recent Labs  Lab 07/13/2019 1714  AST 33  ALT 28  ALKPHOS 224*  BILITOT 0.8  PROT 7.3  ALBUMIN 2.2*   No results for input(s): LIPASE, AMYLASE in the last 168 hours. Recent Labs  Lab 06/30/2019 1714  AMMONIA 14   Coagulation Profile: No results for input(s): INR, PROTIME in the last 168 hours. Cardiac Enzymes: No results for input(s): CKTOTAL, CKMB, CKMBINDEX, TROPONINI in the last 168 hours. BNP (last 3 results) No results for input(s): PROBNP in the last 8760 hours. HbA1C: No results  for input(s): HGBA1C in the last 72 hours. CBG: No results for input(s): GLUCAP in the last 168 hours. Lipid Profile: No results for input(s): CHOL, HDL, LDLCALC, TRIG, CHOLHDL, LDLDIRECT in the last 72 hours. Thyroid Function Tests: No results for input(s): TSH, T4TOTAL, FREET4, T3FREE, THYROIDAB in the last 72 hours. Anemia Panel: No results for input(s): VITAMINB12, FOLATE, FERRITIN, TIBC, IRON, RETICCTPCT in the last 72 hours. Urine analysis:    Component Value Date/Time   COLORURINE YELLOW 07/09/2019 1537   APPEARANCEUR CLEAR 06/30/2019 1537   LABSPEC 1.006 07/10/2019 1537   PHURINE 6.0 07/05/2019 1537   GLUCOSEU NEGATIVE 06/27/2019 1537   HGBUR MODERATE (A) 06/30/2019 1537   BILIRUBINUR NEGATIVE 06/29/2019 1537   KETONESUR NEGATIVE 07/10/2019 1537   PROTEINUR 30 (A) 06/14/2019 1537   NITRITE NEGATIVE 06/28/2019 1537   LEUKOCYTESUR LARGE (A) 07/14/2019 1537   Sepsis Labs: '@LABRCNTIP'$ (procalcitonin:4,lacticidven:4) )No results found for this or any previous visit (from the past 240 hour(s)).   Radiological Exams on Admission: DG Chest 2 View  Result Date: 07/07/2019 CLINICAL DATA:  Confusion. Decreased oxygen saturation. Weakness. History of prostate cancer. EXAM: CHEST - 2 VIEW COMPARISON:  Most recent radiograph 06/10/2019 FINDINGS: Upper normal heart size with unchanged mediastinal contours. Decreased right pleural effusion from prior exam. No definite acute airspace disease, blastic osseous lesions partially obscure evaluation of the underlying pulmonary parenchyma. No pulmonary edema. No pneumothorax. Multifocal blastic osseous metastatic disease, as seen on prior exam. Prior left fifth and sixth rib fractures. IMPRESSION: 1. Decreased right pleural effusion from prior exam. No definite acute abnormality. 2. Multifocal blastic osseous metastatic disease. Electronically Signed   By: Keith Rake M.D.   On: 06/21/2019 16:45   CT Head Wo Contrast  Result Date: 06/14/2019  CLINICAL DATA:  Delirium.  History of prostate cancer and seizures. EXAM: CT HEAD WITHOUT CONTRAST TECHNIQUE: Contiguous axial images were obtained from the base of the skull through the vertex without intravenous contrast. COMPARISON:  MRI 05/20/2019 FINDINGS: Brain: The brain shows a normal appearance without evidence of malformation, atrophy, old or acute small or large vessel infarction, mass lesion, hemorrhage, hydrocephalus or extra-axial collection. Vascular: No hyperdense vessel. No evidence of atherosclerotic calcification. Skull: Sclerotic and mixed lytic changes of C1, the skull base  and calvarium consistent with the patient's known osseous metastatic disease. Sinuses/Orbits: Sinuses are clear. Orbits appear normal. Mastoids are clear. Other: None significant IMPRESSION: Normal appearance of the brain itself. Known metastatic disease of the bone. Electronically Signed   By: Nelson Chimes M.D.   On: 07/01/2019 17:05   CT RENAL STONE STUDY  Result Date: 07/09/2019 CLINICAL DATA:  History of prostate carcinoma with renal failure EXAM: CT ABDOMEN AND PELVIS WITHOUT CONTRAST TECHNIQUE: Multidetector CT imaging of the abdomen and pelvis was performed following the standard protocol without IV contrast. COMPARISON:  12/23/2018 FINDINGS: Lower chest: Lung bases demonstrate small left pleural effusion and consolidation improved when compared with the prior exam. No new focal infiltrate is seen. Decreased attenuation is noted in the cardiac blood pool suggestive of anemia. Hepatobiliary: Liver is within normal limits. The gallbladder is well distended with a single dependent gallstone in the gallbladder neck. No pericholecystic fluid is seen. Pancreas: Unremarkable. No pancreatic ductal dilatation or surrounding inflammatory changes. Spleen: Normal in size without focal abnormality. Adrenals/Urinary Tract: Adrenal glands are within normal limits bilaterally. Severe hydronephrosis is noted bilaterally. This  extends to the level of the urinary bladder. The bladder is partially distended. Considerable in growth of the known prostate carcinoma is noted inferiorly contributing to the hydronephrosis. Bladder wall thickening is noted likely related to a degree of bladder outlet obstruction. Stomach/Bowel: The appendix is within normal limits. The colon shows no obstructive or inflammatory changes. No small bowel or gastric abnormality is noted. Vascular/Lymphatic: Atherosclerotic calcifications of the aorta and iliac vessels are seen. No sizable lymphadenopathy is noted. Reproductive: Prostate again demonstrates in growth of tumor into the inferior aspect of bladder. This appears increased when compared with the prior exam measuring approximately 6.3 x 4.4 cm. Other: No abdominal wall hernia or abnormality. No abdominopelvic ascites. Musculoskeletal: Sclerotic bony metastatic disease is noted IMPRESSION: Enlarging prostate neoplasm with further ingrowth into the inferior aspect of the bladder with resulting severe hydronephrosis bilaterally. The degree of hydronephrosis is relatively stable when compared with the prior exam. Cholelithiasis. Right lower lobe consolidation with right-sided effusion improved from the prior exam. No other focal abnormality is noted. Electronically Signed   By: Inez Catalina M.D.   On: 06/22/2019 21:07     Assessment/Plan Principal Problem:   GI bleed Active Problems:   Urinary retention   AKI (acute kidney injury) ([place])   Cholelithiasis   Diabetes mellitus, new onset ([place])   CKD (chronic kidney disease) stage 3, GFR 30-59 ml/min     #1 GI bleed: Patient is still guaiac positive with hemoglobin 6.4.  Source unclear.  It could be upper GI.  Could also be related to invading prostate cancer.  We will admit the patient.  Serial H&H, IV Protonix, GI consultation.  We will transfuse at least 2 units of packed red blood cells.  #2 acute on chronic kidney injury 3: CT renal shows  hydronephrosis with enlarging prostate cancer.  This may be obstructive uropathy on top of prerenal causes.  We will hydrate and monitor renal function.  May require urology intervention.  Baseline creatinine is around 2.  #3 diabetes: Sliding scale insulin.  Continue close monitoring.  #4 hyperlipidemia: Continue with statin     DVT prophylaxis: SCD Code Status: Full code Family Communication: Discussed care with patient no family around Disposition Plan: To be determined Consults called: Gastroenterology, [place] Admission status: Inpatient  Severity of Illness: The appropriate patient status for this patient is INPATIENT. Inpatient status is  judged to be reasonable and necessary in order to provide the required intensity of service to ensure the patient's safety. The patient's presenting symptoms, physical exam findings, and initial radiographic and laboratory data in the context of their chronic comorbidities is felt to place them at high risk for further clinical deterioration. Furthermore, it is not anticipated that the patient will be medically stable for discharge from the hospital within 2 midnights of admission. The following factors support the patient status of inpatient.   " The patient's presenting symptoms include weakness. " The worrisome physical exam findings include pale. " The initial radiographic and laboratory data are worrisome because of hemoglobin 6.4. " The chronic co-morbidities include metastatic prostate cancer.   * I certify that at the point of admission it is my clinical judgment that the patient will require inpatient hospital care spanning beyond 2 midnights from the point of admission due to high intensity of service, high risk for further deterioration and high frequency of surveillance required.Barbette Merino MD [facility] Pager [phone]  If 7PM-7AM, please contact night-coverage [email or web address] Password [facility]  06/24/2019, 12:18 AM

## 2019-06-23 NOTE — Telephone Encounter (Signed)
Jeffery [name], physical therapist with [facility] left a message stating patient's heart rate is 126, O2 sats 89%-91% BP 110/62. She said the patient said he does not feel like his normal self and has also experienced some hand tremors. She stated she has not started his PT for today. Dr. Alen Blew made aware and stated patient needs to go to the Emergency Room if he is experiencing pain or shortness of breath. Called and spoke to [place] and she stated patient is sitting in a chair and has a lot of hip pain, shortness of breath, confusion, and is slow to get his words out. She stated his sat dropped to 87%. She stated patient's mother is going to take him to the ED. Dr. Alen Blew made aware.

## 2019-06-23 NOTE — ED Notes (Signed)
Date and time results received: 07/02/2019 5:36 PM  (use smartphrase ".now" to insert current time)  Test: Hgb Critical Value: 6.4  Name of Provider Notified: Zackowski  Orders Received? Or Actions Taken?: Orders Received - See Orders for details

## 2019-06-23 NOTE — ED Provider Notes (Addendum)
[facility] DEPT Provider Note   CSN: [id number] Arrival date & time: 07/12/2019  1504     History Chief Complaint  Patient presents with  . Weakness  . Painful Urination    Jeffery [name] is a 59 y.o. male.  HPI   Patient was transferred here by EMS for evaluation of hypoxia, and tachycardia.  He has been seen by home health at his home, who contacted his oncology service, who then recommended the patient come to the ED for evaluation.  The patient lives with his mother, who was not here initially.   Level 5 caveat-confusion  The patient was seen, on 06/10/2019 by neuro oncologist, Dr. Mickeal Skinner.  At that time he was being evaluated for confusion and was diagnosed with metabolic encephalopathy, polyfactorial.  This has been additionally evaluated with brain MRI serologic evaluation and EEG.  He has been followed for hypoxia, and reportedly home oxygen has been ordered, but he is not yet available.  Patient is being treated for metastatic bone cancer.  Recently had radiation therapy.  MRI imaging brain, done, 05/20/2019, no metastases.     Past Medical History:  Diagnosis Date  . Cancer ([name])    stage IV prostate cancer per patient  . Diabetes mellitus, new onset ([place]) 07/2016  . Enlarged prostate   . HLD (hyperlipidemia)     Patient Active Problem List   Diagnosis Date Noted  . Seizure-like activity ([place]) 05/20/2019  . Metastatic cancer to spine ([place]) 04/20/2019  . Cervical radiculopathy 02/23/2019  . Pain in thoracic spine 02/23/2019  . Neck pain 02/18/2019  . Open fracture of base of distal phalanx of left thumb 02/04/2019  . Acute renal failure superimposed on stage 3 chronic kidney disease ([place]) 10/13/2018  . Vitamin B12 deficiency 10/13/2018  . Carcinoma of prostate ([place]) 10/13/2018  . Acute cystitis with hematuria   . Acute hyperkalemia   . DM2 (diabetes mellitus, type 2) ([place]) 10/12/2018  . Dysuria 10/12/2018  . CKD (chronic  kidney disease) stage 3, GFR 30-59 ml/min 10/12/2018  . Hyperkalemia 10/12/2018  . Metabolic acidosis, normal anion gap (NAG) 10/12/2018  . Sinus tachycardia 07/31/2016  . Diabetes mellitus, new onset ([place])   . Hyperglycemia 07/29/2016  . Urinary retention 07/29/2016  . AKI (acute kidney injury) ([place]) 07/29/2016  . Cholelithiasis 07/29/2016  . Fatty liver 07/29/2016  . Hydronephrosis 07/29/2016  . Hypertension 07/29/2016    Past Surgical History:  Procedure Laterality Date  . I & D EXTREMITY Left 01/27/2019   Procedure: IRRIGATION AND DEBRIDEMENT EXTREMITY;  Surgeon: Iran Planas, MD;  Location: [facility];  Service: Orthopedics;  Laterality: Left;  . KNEE ARTHROSCOPY Right   . ORCHIECTOMY Bilateral 10/12/2018   Procedure: ORCHIECTOMY;  Surgeon: Lucas Mallow, MD;  Location: [facility];  Service: Urology;  Laterality: Bilateral;  . TRANSURETHRAL RESECTION OF PROSTATE  10/12/2018   Procedure: CYSTOSCOPY WITH URETHRAL DILATION;  Surgeon: Lucas Mallow, MD;  Location: [facility];  Service: Urology;;       Family History  Problem Relation Age of Onset  . Prostate cancer Father   . Heart disease Father   . Prostate cancer Paternal Grandfather     Social History   Tobacco Use  . Smoking status: Former Smoker    Packs/day: 1.50    Years: 42.00    Pack years: 63.00    Types: Cigarettes    Quit date: 2015    Years since quitting: 6.1  . Smokeless tobacco:  Never Used  . Tobacco comment: 2015  Substance Use Topics  . Alcohol use: No  . Drug use: No    Home Medications Prior to Admission medications   Medication Sig Start Date End Date Taking? Authorizing Provider  Amino Acids-Protein Hydrolys (FEEDING SUPPLEMENT, PRO-STAT SUGAR FREE 64,) LIQD Take 30 mLs by mouth 3 (three) times daily. 10/14/18   Hongalgi, Lenis Dickinson, MD  Blood Glucose Monitoring Suppl (RELION CONFIRM GLUCOSE MONITOR) w/Device KIT Use 4 times daily before meals and bedtime as directed. 07/31/16   Holley Raring, MD    feeding supplement, ENSURE ENLIVE, (ENSURE ENLIVE) LIQD Take 237 mLs by mouth daily. 10/14/18   Hongalgi, Lenis Dickinson, MD  finasteride (PROSCAR) 5 MG tablet Take 5 mg by mouth daily. 02/12/18   [provider]  furosemide (LASIX) 40 MG tablet Take 40 mg by mouth daily.    [provider]  gabapentin (NEURONTIN) 300 MG capsule Take 1-2 capsules (300-600 mg total) by mouth 3 (three) times daily. 348m in am and 6059min evening 05/31/19   ShWyatt PortelaMD  glucose blood (RELION GLUCOSE TEST STRIPS) test strip Use as instructed 07/31/16   StHolley RaringMD  HYDROcodone-acetaminophen (NORCO/VICODIN) 5-325 MG tablet TAKE 1 TO 2 TABLETS BY MOUTH EVERY 4 TO 6 HOURS AS NEEDED FOR PAIN (MAX OF 8 TABLETS IN 24 HOURS) 06/15/19   ShWyatt PortelaMD  Lancets 30G MISC Use 4 times daily before meals as directed. 07/31/16   StHolley RaringMD  metFORMIN (GLUCOPHAGE-XR) 500 MG 24 hr tablet Take 500 mg by mouth 2 (two) times daily.  01/12/19   [provider]  Multiple Vitamin (MULTIVITAMIN WITH MINERALS) TABS tablet Take 1 tablet by mouth daily. 10/15/18   Hongalgi, AnLenis DickinsonMD  predniSONE (DELTASONE) 5 MG tablet Take 1 tablet (5 mg total) by mouth daily with breakfast. 11/03/18   ShWyatt PortelaMD  tamsulosin (FLOMAX) 0.4 MG CAPS capsule Take 2 capsules (0.8 mg total) by mouth daily after supper. 10/14/18   Hongalgi, AnLenis DickinsonMD  vitamin B-12 (CYANOCOBALAMIN) 1000 MCG tablet Take 1 tablet (1,000 mcg total) by mouth daily. 10/14/18   Hongalgi, AnLenis DickinsonMD  ZYTIGA 250 MG tablet TAKE 4 TABLETS BY MOUTH ONCE DAILY AS DIRECTED.  TAKE 1 HOUR BEFORE OR 2 HOURS AFTER A MEAL Patient taking differently: Take 1,000 mg by mouth daily.  12/18/18   ShWyatt PortelaMD    Allergies    Patient has no known allergies.  Review of Systems   Review of Systems  Unable to perform ROS: Mental status change    Physical Exam Updated Vital Signs BP 134/86   Pulse (!) 126   Temp 99 F (37.2 C) (Oral)   Resp 20    SpO2 100%   Physical Exam Vitals and nursing note reviewed.  Constitutional:      General: He is in acute distress.     Appearance: He is well-developed. He is obese. He is ill-appearing and toxic-appearing. He is not diaphoretic.  HENT:     Head: Normocephalic and atraumatic.     Right Ear: External ear normal.     Left Ear: External ear normal.     Nose: No congestion or rhinorrhea.  Eyes:     Conjunctiva/sclera: Conjunctivae normal.     Pupils: Pupils are equal, round, and reactive to light.  Neck:     Trachea: Phonation normal.  Cardiovascular:     Rate and Rhythm: Regular rhythm. Tachycardia  present.     Heart sounds: Normal heart sounds. No murmur.  Pulmonary:     Effort: Pulmonary effort is normal. No respiratory distress.     Breath sounds: Normal breath sounds. No stridor.  Abdominal:     General: There is distension.     Palpations: Abdomen is soft.     Tenderness: There is no abdominal tenderness.  Musculoskeletal:        General: Swelling (Left lower leg, greater than right) present.     Cervical back: Normal range of motion and neck supple.     Comments: Normal range of motion arms and right leg.  Unable to lift left leg at all off the stretcher.  Skin:    General: Skin is warm and dry.  Neurological:     Mental Status: He is alert. He is disoriented and confused.     GCS: GCS eye subscore is 4. GCS verbal subscore is 4. GCS motor subscore is 6.     Cranial Nerves: No cranial nerve deficit or dysarthria.     Sensory: No sensory deficit.     Motor: No tremor or abnormal muscle tone.     Coordination: Coordination normal.     ED Results / Procedures / Treatments   Labs (all labs ordered are listed, but only abnormal results are displayed) Labs Reviewed  URINALYSIS, ROUTINE W REFLEX MICROSCOPIC - Abnormal; Notable for the following components:      Result Value   Hgb urine dipstick MODERATE (*)    Protein, ur 30 (*)    Leukocytes,Ua LARGE (*)    WBC, UA  >50 (*)    Bacteria, UA MANY (*)    All other components within normal limits  CULTURE, BLOOD (ROUTINE X 2)  CULTURE, BLOOD (ROUTINE X 2)  SARS CORONAVIRUS 2 (TAT 6-24 HRS)  URINE CULTURE  BLOOD GAS, VENOUS  COMPREHENSIVE METABOLIC PANEL  LACTIC ACID, PLASMA  LACTIC ACID, PLASMA  CBC WITH DIFFERENTIAL/PLATELET  AMMONIA  ETHANOL  RAPID URINE DRUG SCREEN, HOSP PERFORMED    EKG None  Radiology DG Chest 2 View  Result Date: 07/14/2019 CLINICAL DATA:  Confusion. Decreased oxygen saturation. Weakness. History of prostate cancer. EXAM: CHEST - 2 VIEW COMPARISON:  Most recent radiograph 06/10/2019 FINDINGS: Upper normal heart size with unchanged mediastinal contours. Decreased right pleural effusion from prior exam. No definite acute airspace disease, blastic osseous lesions partially obscure evaluation of the underlying pulmonary parenchyma. No pulmonary edema. No pneumothorax. Multifocal blastic osseous metastatic disease, as seen on prior exam. Prior left fifth and sixth rib fractures. IMPRESSION: 1. Decreased right pleural effusion from prior exam. No definite acute abnormality. 2. Multifocal blastic osseous metastatic disease. Electronically Signed   By: Keith Rake M.D.   On: 06/24/2019 16:45    Procedures .Critical Care Performed by: Daleen Bo, MD Authorized by: Daleen Bo, MD   Critical care provider statement:    Critical care time (minutes):  35   Critical care start time:  06/18/2019 3:20 PM   Critical care end time:  06/28/2019 5:00 PM   Critical care time was exclusive of:  Separately billable procedures and treating other patients   Critical care was necessary to treat or prevent imminent or life-threatening deterioration of the following conditions:  Circulatory failure   Critical care was time spent personally by me on the following activities:  Blood draw for specimens, development of treatment plan with patient or surrogate, discussions with consultants,  evaluation of patient's response to treatment, examination of  patient, obtaining history from patient or surrogate, ordering and performing treatments and interventions, ordering and review of laboratory studies, pulse oximetry, re-evaluation of patient's condition, review of old charts and ordering and review of radiographic studies   (including critical care time)  Medications Ordered in ED Medications  cefTRIAXone (ROCEPHIN) 1 g in sodium chloride 0.9 % 100 mL IVPB (has no administration in time range)    ED Course  I have reviewed the triage vital signs and the nursing notes.  Pertinent labs & imaging results that were available during my care of the patient were reviewed by me and considered in my medical decision making (see chart for details).  Clinical Course as of Jun 23 1650  Wed Jun 23, 2019  1649 Abnormal, consistent with UTI: Large leukocytes, protein, increased WBC, many bacteria, urine culture ordered  Urinalysis, Routine w reflex microscopic(!) [EW]  1650 Rocephin started for UTI.   [EW]    Clinical Course User Index [EW] Daleen Bo, MD   MDM Rules/Calculators/A&P                       Patient Vitals for the past 24 hrs:  BP Temp Temp src Pulse Resp SpO2  07/10/2019 1600 134/86 -- -- (!) 126 20 100 %  06/28/2019 1529 127/86 99 F (37.2 C) Oral (!) 127 20 99 %     Medical Decision Making: Confusion and encephalopathy, presenting with hypoxia, known prior to admission, not currently on home oxygen.  Abnormal urinalysis consistent with infection, likely worsening ongoing encephalopathy.  Patient will require admission for stabilization.  Screening Covid test ordered.  Does not appear that he has had copious previously.  AKASH WINSKI was evaluated in Emergency Department on 07/03/2019 for the symptoms described in the history of present illness. He was evaluated in the context of the global COVID-19 pandemic, which necessitated consideration that the patient  might be at risk for infection with the SARS-CoV-2 virus that causes COVID-19. Institutional protocols and algorithms that pertain to the evaluation of patients at risk for COVID-19 are in a state of rapid change based on information released by regulatory bodies including the CDC and federal and state organizations. These policies and algorithms were followed during the patient's care in the ED.  CRITICAL CARE- yes Performed by: Daleen Bo   Nursing Notes Reviewed/ Care Coordinated Applicable Imaging Reviewed Interpretation of Laboratory Data incorporated into ED treatment  Transition of care, to Dr. Rogene Houston, to arrange admission following completion additional laboratory evaluation.  Final Clinical Impression(s) / ED Diagnoses Final diagnoses:  Metabolic encephalopathy  Hypoxia  Left leg weakness  Urinary tract infection without hematuria, site unspecified    Rx / DC Orders ED Discharge Orders    None       Daleen Bo, MD 06/24/19 2229    Daleen Bo, MD 06/24/19 [phone]

## 2019-06-23 NOTE — ED Triage Notes (Signed)
Pt BIB EMS from home. Pt has PT that comes to his house. Pt has prostate cancer. EMS reports that PT states pt was 89% RA. Pt has newly prescribed home O2 that pt does not have at home yet. EMS reports pt family reports pt has increased weakness. Pt c/o weakness and painful urination. EMS reports pt 99% on 4L [place]. Per EMS, pt denies SOB. Pt reports he has tremors at baseline. Pt has hx of chronic left hip and left leg pain., hx of diabetes.   CBG 237- pt states this is normal for him 130/82 128 HR

## 2019-06-24 ENCOUNTER — Encounter (HOSPITAL_COMMUNITY): Payer: Self-pay | Admitting: Internal Medicine

## 2019-06-24 DIAGNOSIS — D500 Iron deficiency anemia secondary to blood loss (chronic): Secondary | ICD-10-CM

## 2019-06-24 DIAGNOSIS — R339 Retention of urine, unspecified: Secondary | ICD-10-CM

## 2019-06-24 DIAGNOSIS — A419 Sepsis, unspecified organism: Secondary | ICD-10-CM

## 2019-06-24 DIAGNOSIS — R6520 Severe sepsis without septic shock: Secondary | ICD-10-CM

## 2019-06-24 DIAGNOSIS — K2971 Gastritis, unspecified, with bleeding: Secondary | ICD-10-CM

## 2019-06-24 DIAGNOSIS — R195 Other fecal abnormalities: Secondary | ICD-10-CM

## 2019-06-24 LAB — COMPREHENSIVE METABOLIC PANEL
ALT: 23 U/L (ref 0–44)
AST: 28 U/L (ref 15–41)
Albumin: 2 g/dL — ABNORMAL LOW (ref 3.5–5.0)
Alkaline Phosphatase: 186 U/L — ABNORMAL HIGH (ref 38–126)
Anion gap: 14 (ref 5–15)
BUN: 61 mg/dL — ABNORMAL HIGH (ref 6–20)
CO2: 24 mmol/L (ref 22–32)
Calcium: 8.2 mg/dL — ABNORMAL LOW (ref 8.9–10.3)
Chloride: 108 mmol/L (ref 98–111)
Creatinine, Ser: 3.79 mg/dL — ABNORMAL HIGH (ref 0.61–1.24)
GFR calc Af Amer: 19 mL/min — ABNORMAL LOW (ref >60)
GFR calc non Af Amer: 17 mL/min — ABNORMAL LOW (ref >60)
Glucose, Bld: 141 mg/dL — ABNORMAL HIGH (ref 70–99)
Potassium: 4.4 mmol/L (ref 3.5–5.1)
Sodium: 146 mmol/L — ABNORMAL HIGH (ref 135–145)
Total Bilirubin: 1 mg/dL (ref 0.3–1.2)
Total Protein: 6.5 g/dL (ref 6.5–8.1)

## 2019-06-24 LAB — CBC
HCT: 20.2 % — ABNORMAL LOW (ref 39.0–52.0)
HCT: 26 % — ABNORMAL LOW (ref 39.0–52.0)
Hemoglobin: 5.7 g/dL — CL (ref 13.0–17.0)
Hemoglobin: 7.5 g/dL — ABNORMAL LOW (ref 13.0–17.0)
MCH: 25.1 pg — ABNORMAL LOW (ref 26.0–34.0)
MCH: 25.8 pg — ABNORMAL LOW (ref 26.0–34.0)
MCHC: 28.2 g/dL — ABNORMAL LOW (ref 30.0–36.0)
MCHC: 28.8 g/dL — ABNORMAL LOW (ref 30.0–36.0)
MCV: 89 fL (ref 80.0–100.0)
MCV: 89.3 fL (ref 80.0–100.0)
Platelets: 372 10*3/uL (ref 150–400)
Platelets: 336 10*3/uL (ref 150–400)
RBC: 2.27 MIL/uL — ABNORMAL LOW (ref 4.22–5.81)
RBC: 2.91 MIL/uL — ABNORMAL LOW (ref 4.22–5.81)
RDW: 19.2 % — ABNORMAL HIGH (ref 11.5–15.5)
RDW: 18 % — ABNORMAL HIGH (ref 11.5–15.5)
WBC: 9.6 10*3/uL (ref 4.0–10.5)
WBC: 10.2 10*3/uL (ref 4.0–10.5)
nRBC: 0 % (ref 0.0–0.2)
nRBC: 0 % (ref 0.0–0.2)

## 2019-06-24 LAB — ABO/RH: ABO/RH(D): O POS

## 2019-06-24 LAB — URINE CULTURE

## 2019-06-24 LAB — HEMOGLOBIN A1C
Hgb A1c MFr Bld: 6.6 % — ABNORMAL HIGH (ref 4.8–5.6)
Mean Plasma Glucose: 142.72 mg/dL

## 2019-06-24 LAB — SARS CORONAVIRUS 2 (TAT 6-24 HRS): SARS Coronavirus 2: NEGATIVE

## 2019-06-24 LAB — MRSA PCR SCREENING: MRSA by PCR: NEGATIVE

## 2019-06-24 MED ORDER — METOCLOPRAMIDE HCL 5 MG/ML IJ SOLN
10.0000 mg | Freq: Once | INTRAMUSCULAR | Status: AC
  Administered 2019-06-24: 10 mg via INTRAVENOUS
  Filled 2019-06-24: qty 2

## 2019-06-24 MED ORDER — PEG-KCL-NACL-NASULF-NA ASC-C 100 G PO SOLR
0.5000 | Freq: Once | ORAL | Status: AC
  Administered 2019-06-25: 100 g via ORAL

## 2019-06-24 MED ORDER — ORAL CARE MOUTH RINSE
15.0000 mL | Freq: Two times a day (BID) | OROMUCOSAL | Status: DC
  Administered 2019-06-24 – 2019-07-08 (×22): 15 mL via OROMUCOSAL

## 2019-06-24 MED ORDER — PANTOPRAZOLE SODIUM 40 MG IV SOLR
40.0000 mg | Freq: Two times a day (BID) | INTRAVENOUS | Status: DC
  Administered 2019-06-24: 40 mg via INTRAVENOUS
  Filled 2019-06-24: qty 40

## 2019-06-24 MED ORDER — PEG-KCL-NACL-NASULF-NA ASC-C 100 G PO SOLR
0.5000 | Freq: Once | ORAL | Status: DC
  Filled 2019-06-24: qty 1

## 2019-06-24 MED ORDER — ACETAMINOPHEN 650 MG RE SUPP: 650.0000 mg | Freq: Four times a day (QID) | RECTAL | Status: DC | PRN

## 2019-06-24 MED ORDER — SODIUM CHLORIDE 0.9 % IV SOLN
2.0000 g | INTRAVENOUS | Status: DC
  Administered 2019-06-24: 2 g via INTRAVENOUS
  Filled 2019-06-24: qty 2
  Filled 2019-06-24: qty 20

## 2019-06-24 MED ORDER — CHLORHEXIDINE GLUCONATE CLOTH 2 % EX PADS
6.0000 | MEDICATED_PAD | Freq: Every day | CUTANEOUS | Status: DC
  Administered 2019-06-24 – 2019-07-06 (×11): 6 via TOPICAL

## 2019-06-24 MED ORDER — INSULIN ASPART 100 UNIT/ML ~~LOC~~ SOLN
0.0000 [IU] | Freq: Four times a day (QID) | SUBCUTANEOUS | Status: DC
  Administered 2019-06-25: 2 [IU] via SUBCUTANEOUS
  Administered 2019-06-25 – 2019-06-27 (×9): 1 [IU] via SUBCUTANEOUS
  Administered 2019-06-27: 2 [IU] via SUBCUTANEOUS
  Administered 2019-06-27 – 2019-06-28 (×4): 1 [IU] via SUBCUTANEOUS
  Administered 2019-06-28: 2 [IU] via SUBCUTANEOUS
  Administered 2019-06-29: 3 [IU] via SUBCUTANEOUS
  Administered 2019-06-29: 2 [IU] via SUBCUTANEOUS
  Administered 2019-06-29: 1 [IU] via SUBCUTANEOUS
  Administered 2019-06-29: 3 [IU] via SUBCUTANEOUS
  Administered 2019-06-30: 2 [IU] via SUBCUTANEOUS
  Administered 2019-06-30: 3 [IU] via SUBCUTANEOUS
  Administered 2019-06-30: 2 [IU] via SUBCUTANEOUS
  Administered 2019-07-01: 3 [IU] via SUBCUTANEOUS
  Administered 2019-07-01: 2 [IU] via SUBCUTANEOUS
  Administered 2019-07-01 (×2): 1 [IU] via SUBCUTANEOUS
  Administered 2019-07-01 – 2019-07-03 (×6): 2 [IU] via SUBCUTANEOUS
  Administered 2019-07-03: 12:00:00 1 [IU] via SUBCUTANEOUS
  Administered 2019-07-03: 19:00:00 2 [IU] via SUBCUTANEOUS

## 2019-06-24 MED ORDER — BISACODYL 5 MG PO TBEC
20.0000 mg | DELAYED_RELEASE_TABLET | Freq: Once | ORAL | Status: AC
  Administered 2019-06-24: 20 mg via ORAL
  Filled 2019-06-24: qty 4

## 2019-06-24 MED ORDER — PEG-KCL-NACL-NASULF-NA ASC-C 100 G PO SOLR: 1.0000 | Freq: Once | ORAL | Status: DC

## 2019-06-24 MED ORDER — PANTOPRAZOLE SODIUM 40 MG PO TBEC
40.0000 mg | DELAYED_RELEASE_TABLET | Freq: Every day | ORAL | Status: DC
  Administered 2019-06-25 – 2019-07-04 (×10): 40 mg via ORAL
  Filled 2019-06-24 (×10): qty 1

## 2019-06-24 MED ORDER — METOCLOPRAMIDE HCL 5 MG/ML IJ SOLN: 10.0000 mg | Freq: Once | INTRAMUSCULAR | Status: DC

## 2019-06-24 MED ORDER — ONDANSETRON HCL 4 MG PO TABS: 4.0000 mg | ORAL_TABLET | Freq: Four times a day (QID) | ORAL | Status: DC | PRN

## 2019-06-24 MED ORDER — SODIUM CHLORIDE 0.9 % IV SOLN: INTRAVENOUS | Status: DC

## 2019-06-24 MED ORDER — PEG-KCL-NACL-NASULF-NA ASC-C 100 G PO SOLR
0.5000 | Freq: Once | ORAL | Status: AC
  Administered 2019-06-24: 100 g via ORAL
  Filled 2019-06-24: qty 1

## 2019-06-24 MED ORDER — PANTOPRAZOLE SODIUM 40 MG PO TBEC: 40.0000 mg | DELAYED_RELEASE_TABLET | Freq: Every day | ORAL | Status: DC

## 2019-06-24 MED ORDER — MORPHINE SULFATE (PF) 2 MG/ML IV SOLN
2.0000 mg | INTRAVENOUS | Status: DC | PRN
  Administered 2019-06-24 (×2): 2 mg via INTRAVENOUS
  Filled 2019-06-24 (×2): qty 1

## 2019-06-24 MED ORDER — METOCLOPRAMIDE HCL 5 MG/ML IJ SOLN
10.0000 mg | Freq: Once | INTRAMUSCULAR | Status: AC
  Administered 2019-06-25: 10 mg via INTRAVENOUS
  Filled 2019-06-24: qty 2

## 2019-06-24 MED ORDER — PEG-KCL-NACL-NASULF-NA ASC-C 100 G PO SOLR: 0.5000 | Freq: Once | ORAL | Status: DC

## 2019-06-24 MED ORDER — SODIUM CHLORIDE 0.9 % IV BOLUS
2000.0000 mL | Freq: Once | INTRAVENOUS | Status: AC
  Administered 2019-06-24: 2000 mL via INTRAVENOUS

## 2019-06-24 MED ORDER — ONDANSETRON HCL 4 MG/2ML IJ SOLN: 4.0000 mg | Freq: Four times a day (QID) | INTRAMUSCULAR | Status: DC | PRN

## 2019-06-24 MED ORDER — ACETAMINOPHEN 325 MG PO TABS
650.0000 mg | ORAL_TABLET | Freq: Four times a day (QID) | ORAL | Status: DC | PRN
  Administered 2019-06-24 – 2019-06-29 (×15): 650 mg via ORAL
  Filled 2019-06-24 (×15): qty 2

## 2019-06-24 NOTE — Consult Note (Addendum)
H&P Physician requesting consult: Dr Sloan Leiter  Chief Complaint: Bilateral hydronephrosis  History of Present Illness: The patient has known metastatic prostate cancer and bilateral hydronephrosis.  His creatinine had originally been stable and therefore we elected against nephrostomy tubes.  He is now in the hospital for acute blood loss anemia yet to be determined the cause.  I reviewed the CT scan, and he had a very distended bladder.  Therefore, Foley catheter was placed.  His creatinine is worsening.  Past Medical History:  Diagnosis Date  . Cancer ([place])    stage IV prostate cancer per patient  . Diabetes mellitus, new onset ([place]) 07/2016  . Enlarged prostate   . HLD (hyperlipidemia)    Past Surgical History:  Procedure Laterality Date  . I & D EXTREMITY Left 01/27/2019   Procedure: IRRIGATION AND DEBRIDEMENT EXTREMITY;  Surgeon: Iran Planas, MD;  [facility];  Service: Orthopedics;  Laterality: Left;  . KNEE ARTHROSCOPY Right   . ORCHIECTOMY Bilateral 10/12/2018   Procedure: ORCHIECTOMY;  Surgeon: Lucas Mallow, MD;  Location: [facility];  Service: Urology;  Laterality: Bilateral;  . TRANSURETHRAL RESECTION OF PROSTATE  10/12/2018   Procedure: CYSTOSCOPY WITH URETHRAL DILATION;  Surgeon: Lucas Mallow, MD;  Location: [facility];  Service: Urology;;    Home Medications:  Facility-Administered Medications Prior to Admission  Medication Dose Route Frequency Provider Last Rate Last Admin  . morphine 4 MG/ML injection 2 mg  2 mg Intramuscular Once Bruning, Ashlyn, PA-C       Medications Prior to Admission  Medication Sig Dispense Refill Last Dose  . Amino Acids-Protein Hydrolys (FEEDING SUPPLEMENT, PRO-STAT SUGAR FREE 64,) LIQD Take 30 mLs by mouth 3 (three) times daily. 887 mL 0 unknown  . feeding supplement, ENSURE ENLIVE, (ENSURE ENLIVE) LIQD Take 237 mLs by mouth daily. 237 mL 12 Past Week at Unknown time  . finasteride (PROSCAR) 5 MG tablet Take 5 mg by mouth daily.    06/18/2019 at Unknown time  . furosemide (LASIX) 40 MG tablet Take 40 mg by mouth daily.   06/22/2019 at Unknown time  . gabapentin (NEURONTIN) 300 MG capsule Take 1-2 capsules (300-600 mg total) by mouth 3 (three) times daily. 34m in am and 609min evening (Patient taking differently: Take 600 mg by mouth 3 (three) times daily. ) 180 capsule 3 07/06/2019 at Unknown time  . HYDROcodone-acetaminophen (NORCO/VICODIN) 5-325 MG tablet TAKE 1 TO 2 TABLETS BY MOUTH EVERY 4 TO 6 HOURS AS NEEDED FOR PAIN (MAX OF 8 TABLETS IN 24 HOURS) (Patient taking differently: Take 1-2 tablets by mouth every 4 (four) hours as needed for moderate pain or severe pain. TAKE 1 TO 2 TABLETS BY MOUTH EVERY 4 TO 6 HOURS AS NEEDED FOR PAIN (MAX OF 8 TABLETS IN 24 HOURS)) 90 tablet 0 07/05/2019 at Unknown time  . metFORMIN (GLUCOPHAGE-XR) 500 MG 24 hr tablet Take 500-1,000 mg by mouth 2 (two) times daily. Take 2 tablets (1000 mg) in the am and Take 1 tablet at bedtime   06/22/2019 at Unknown time  . Multiple Vitamin (MULTIVITAMIN WITH MINERALS) TABS tablet Take 1 tablet by mouth daily.   06/22/2019 at Unknown time  . predniSONE (DELTASONE) 5 MG tablet Take 1 tablet (5 mg total) by mouth daily with breakfast. 90 tablet 3 07/12/2019 at Unknown time  . tamsulosin (FLOMAX) 0.4 MG CAPS capsule Take 2 capsules (0.8 mg total) by mouth daily after supper.   06/22/2019 at Unknown time  . vitamin B-12 (  CYANOCOBALAMIN) 1000 MCG tablet Take 1 tablet (1,000 mcg total) by mouth daily. 30 tablet 0 06/29/2019 at Unknown time  . ZYTIGA 250 MG tablet TAKE 4 TABLETS BY MOUTH ONCE DAILY AS DIRECTED.  TAKE 1 HOUR BEFORE OR 2 HOURS AFTER A MEAL (Patient taking differently: Take 1,000 mg by mouth daily. ) 120 tablet 10 06/19/2019 at Unknown time  . Blood Glucose Monitoring Suppl (RELION CONFIRM GLUCOSE MONITOR) w/Device KIT Use 4 times daily before meals and bedtime as directed. 1 kit 0   . glucose blood (RELION GLUCOSE TEST STRIPS) test strip Use as instructed 100  each 12   . Lancets 30G MISC Use 4 times daily before meals as directed. 100 each 0    Allergies: No Known Allergies  Family History  Problem Relation Age of Onset  . Prostate cancer Father   . Heart disease Father   . Prostate cancer Paternal Grandfather    Social History:  reports that he quit smoking about 6 years ago. His smoking use included cigarettes. He has a 63.00 pack-year smoking history. He has never used smokeless tobacco. He reports that he does not drink alcohol or use drugs.  ROS: A complete review of systems was performed.  All systems are negative except for pertinent findings as noted. ROS   Physical Exam:  Vital signs in last 24 hours: Temp:  [97.8 F (36.6 C)-102.4 F (39.1 C)] 100.8 F (38.2 C) (03/11 2000) Pulse Rate:  [122-135] 124 (03/11 2000) Resp:  [18-29] 20 (03/11 2000) BP: (96-144)/(51-128) 125/65 (03/11 2000) SpO2:  [93 %-99 %] 94 % (03/11 2000) Weight:  [98.5 kg] 98.5 kg (03/11 0051) General:  Confused.  Arousable and responds to questions HEENT: Normocephalic, atraumatic Neck: No JVD or lymphadenopathy Cardiovascular:  tachycardic Lungs: Regular rate and effort Abdomen: Soft, nontender, nondistended, no abdominal masses  genitourinary: Foley catheter draining clear yellow urine Back: No CVA tenderness Extremities: No edema   Laboratory Data:  Results for orders placed or performed during the hospital encounter of 06/22/2019 (from the past 24 hour(s))  MRSA PCR Screening     Status: None   Collection Time: 06/24/19  1:08 AM   Specimen: Nasal Mucosa; Nasopharyngeal  Result Value Ref Range   MRSA by PCR NEGATIVE NEGATIVE  CBC     Status: Abnormal   Collection Time: 06/24/19  1:12 AM  Result Value Ref Range   WBC 9.6 4.0 - 10.5 K/uL   RBC 2.27 (L) 4.22 - 5.81 MIL/uL   Hemoglobin 5.7 (LL) 13.0 - 17.0 g/dL   HCT 20.2 (L) 39.0 - 52.0 %   MCV 89.0 80.0 - 100.0 fL   MCH 25.1 (L) 26.0 - 34.0 pg   MCHC 28.2 (L) 30.0 - 36.0 g/dL   RDW 19.2  (H) 11.5 - 15.5 %   Platelets 372 150 - 400 K/uL   nRBC 0.0 0.0 - 0.2 %  Hemoglobin A1c     Status: Abnormal   Collection Time: 06/24/19  8:54 AM  Result Value Ref Range   Hgb A1c MFr Bld 6.6 (H) 4.8 - 5.6 %   Mean Plasma Glucose 142.72 mg/dL  Comprehensive metabolic panel     Status: Abnormal   Collection Time: 06/24/19  8:58 AM  Result Value Ref Range   Sodium 146 (H) 135 - 145 mmol/L   Potassium 4.4 3.5 - 5.1 mmol/L   Chloride 108 98 - 111 mmol/L   CO2 24 22 - 32 mmol/L   Glucose, Bld 141 (H) 70 -  99 mg/dL   BUN 61 (H) 6 - 20 mg/dL   Creatinine, Ser 3.79 (H) 0.61 - 1.24 mg/dL   Calcium 8.2 (L) 8.9 - 10.3 mg/dL   Total Protein 6.5 6.5 - 8.1 g/dL   Albumin 2.0 (L) 3.5 - 5.0 g/dL   AST 28 15 - 41 U/L   ALT 23 0 - 44 U/L   Alkaline Phosphatase 186 (H) 38 - 126 U/L   Total Bilirubin 1.0 0.3 - 1.2 mg/dL   GFR calc non Af Amer 17 (L) >60 mL/min   GFR calc Af Amer 19 (L) >60 mL/min   Anion gap 14 5 - 15  CBC     Status: Abnormal   Collection Time: 06/24/19  8:58 AM  Result Value Ref Range   WBC 10.2 4.0 - 10.5 K/uL   RBC 2.91 (L) 4.22 - 5.81 MIL/uL   Hemoglobin 7.5 (L) 13.0 - 17.0 g/dL   HCT 26.0 (L) 39.0 - 52.0 %   MCV 89.3 80.0 - 100.0 fL   MCH 25.8 (L) 26.0 - 34.0 pg   MCHC 28.8 (L) 30.0 - 36.0 g/dL   RDW 18.0 (H) 11.5 - 15.5 %   Platelets 336 150 - 400 K/uL   nRBC 0.0 0.0 - 0.2 %   Recent Results (from the past 240 hour(s))  Urine culture     Status: Abnormal   Collection Time: 07/06/2019  3:37 PM   Specimen: Urine, Random  Result Value Ref Range Status   Specimen Description   Final    URINE, RANDOM Performed at [facility] [address] [place], [place] [address]    Special Requests   Final    NONE Performed at [facility], [place] [address], [place], [place] [address]    Culture MULTIPLE SPECIES PRESENT, SUGGEST RECOLLECTION (A)  Final   Report Status 06/24/2019 FINAL  Final  Culture, blood (routine x 2)     Status: None  (Preliminary result)   Collection Time: 07/11/2019  4:56 PM   Specimen: BLOOD  Result Value Ref Range Status   Specimen Description   Final    BLOOD RIGHT ANTECUBITAL Performed at [facility] [address] [place], [place] [address]    Special Requests   Final    BOTTLES DRAWN AEROBIC ONLY Blood Culture results may not be optimal due to an excessive volume of blood received in culture bottles Performed at [facility] [address], [place], [place] [address]    Culture   Final    NO GROWTH < 12 HOURS Performed at [facility] [address], [place], [place] [address]    Report Status PENDING  Incomplete  Culture, blood (routine x 2)     Status: None (Preliminary result)   Collection Time: 07/05/2019  5:16 PM   Specimen: BLOOD  Result Value Ref Range Status   Specimen Description   Final    BLOOD RIGHT ANTECUBITAL Performed at [facility] [address], [place], [place] [address]    Special Requests   Final    BOTTLES DRAWN AEROBIC AND ANAEROBIC Blood Culture adequate volume Performed at [facility] [address] [place], [place] [address]    Culture  Setup Time   Final    GRAM POSITIVE COCCI IN CHAINS IN BOTH AEROBIC AND ANAEROBIC BOTTLES CRITICAL RESULT CALLED TO, READ BACK BY AND VERIFIED WITH: Minette Brine [id number] MLM Performed at  [facility], [place] [address], [place], [place] [address]    Culture GRAM POSITIVE COCCI  Final   Report Status PENDING  Incomplete  SARS CORONAVIRUS 2 (TAT 6-24 HRS) Nasopharyngeal Nasopharyngeal Swab     Status: None   Collection Time: 06/15/2019  5:20 PM   Specimen: Nasopharyngeal Swab  Result Value Ref Range Status   SARS Coronavirus 2 NEGATIVE NEGATIVE Final    Comment: (NOTE) SARS-CoV-2 target nucleic acids are NOT DETECTED. The SARS-CoV-2 RNA is generally detectable in upper and lower respiratory specimens during the acute phase of  infection. Negative results do not preclude SARS-CoV-2 infection, do not rule out co-infections with other pathogens, and should not be used as the sole basis for treatment or other patient management decisions. Negative results must be combined with clinical observations, patient history, and epidemiological information. The expected result is Negative. Fact Sheet for Patients: SugarRoll.be Fact Sheet for Healthcare Providers: https://www.woods-mathews.com/ This test is not yet approved or cleared by the [place] FDA and  has been authorized for detection and/or diagnosis of SARS-CoV-2 by FDA under an Emergency Use Authorization (EUA). This EUA will remain  in effect (meaning this test can be used) for the duration of the COVID-19 declaration under Section 56 4(b)(1) of the Act, 21 U.S.C. section 360bbb-3(b)(1), unless the authorization is terminated or revoked sooner. Performed at [facility], [place] [address] [place], [place] [address]   MRSA PCR Screening     Status: None   Collection Time: 06/24/19  1:08 AM   Specimen: Nasal Mucosa; Nasopharyngeal  Result Value Ref Range Status   MRSA by PCR NEGATIVE NEGATIVE Final    Comment:        The GeneXpert MRSA Assay (FDA approved for NASAL specimens only), is one component of a comprehensive MRSA colonization surveillance program. It is not intended to diagnose MRSA infection nor to guide or monitor treatment for MRSA infections. Performed at [facility], [place] [address], [place], [place] [address]    Creatinine: Recent Labs    07/07/2019 1714 06/24/19 0858  CREATININE 3.54* 3.79*    Impression/Assessment:  Acute blood loss anemia Bilateral hydronephrosis secondary to advanced prostate cancer Acute on chronic renal insufficiency  Plan:  I recommend his acute blood loss be taken care of first.  His creatinine looks like it is worsening.  This is  secondary to obstruction from advanced prostate cancer.  I would recommend bilateral nephrostomy tube placement once he is stable from a blood loss standpoint.  If he wants to go a palliative care route, he would not need nephrostomy tubes.  Today when I talked to him, he seemed confused and did not really understand what I was talking to him about.  I will try again tomorrow.  Thank you for the consultation.  Marton Redwood, III 06/24/2019, 10:48 PM

## 2019-06-24 NOTE — Consult Note (Addendum)
[facility] Consult: 10:29 AM 06/24/2019  LOS: 1 day    Referring Provider: Dr Raelyn Mora.    Primary Care Physician:  [name], MD Primary Gastroenterologist:  unassigned Radiation Oncologist: Dr.Vaslow.  Oncologist:  Dr Alen Blew.     Reason for Consultation:  Normocytic anemia.  FOBT +.      HPI: Jeffery [name] is a 59 y.o. male.  Hx DM 2, on oral agents.  Stage 4 prostate cancer.  Orchiectomy and TURP 09/2018.  Stool brown and pelvic lymphadenopathy.  On Zytiga.  01/2019 radiation for bone mets to hip and scapula.  Radiation to cervical and lumbar spine x 2 in 04/2019.  CTAP of 12/2018 showed progression of widespread bone mets, no change in tumor at the base of the bladder obstructing the left UVJ and invading right ureter causing bilateral, high-grade hydronephrosis.  He declined Dr. Cecile Sheerer offer for nephrostomy tubes, this MD was unable to place retrograde stents..  Hypoxia, home oxygen recently ordered but not initiated.   Palliative care follows patient at home.  CKD.   Polyfactorial, metabolic encephalopathy dx'd 06/10/2019.  No mets on brain MRI of 05/20/2019.  Multifactorial anemia. Hgb 9 in 03/2019, 9.6 on 04/19/19, 6.6 on 05/27/19 after which he received 2 PRBCs.  Patient's had recent hypoxia and tachycardia.  His oncologist recommended evaluation at the ED. Hgb 5.7 >> 2 PRBCs >> pndg, MCV 89.  Also present is progression of his CKD. 06/23/2018 CT, renal stone study: progressive prostate cancer with further ingrowth into inferior bladder and resulting severe bil hydronephrosis.  Improved right lower lobe consolidation and effusion.  Uncomplicated cholelithiasis.  Patient denies constipation, diarrhea, black or bloody stools.  However he is a poor historian and does not look at his bowel  movements.  Denies abdominal pain.  Endorses anorexia and 30 pound weight loss over the last 6 months.  No nausea, vomiting.  No dysphagia.  No heartburn.  No previous colonoscopy or EGD.  Patient has no medical insurance and is self-pay.  He lives with his mother in [place].  Employed as a Games developer. Fm history pertinent for colon cancer in his father, prostate cancer in both his father and paternal grandfather.  Heart disease in his father.    Past Medical History:  Diagnosis Date  . Cancer ([place])    stage IV prostate cancer per patient  . Diabetes mellitus, new onset ([place]) 07/2016  . Enlarged prostate   . HLD (hyperlipidemia)     Past Surgical History:  Procedure Laterality Date  . I & D EXTREMITY Left 01/27/2019   Procedure: IRRIGATION AND DEBRIDEMENT EXTREMITY;  Surgeon: Iran Planas, MD;  Location: [facility];  Service: Orthopedics;  Laterality: Left;  . KNEE ARTHROSCOPY Right   . ORCHIECTOMY Bilateral 10/12/2018   Procedure: ORCHIECTOMY;  Surgeon: Lucas Mallow, MD;  Location: [facility];  Service: Urology;  Laterality: Bilateral;  . TRANSURETHRAL RESECTION OF PROSTATE  10/12/2018   Procedure: CYSTOSCOPY WITH URETHRAL DILATION;  Surgeon: Lucas Mallow, MD;  Location: [facility];  Service: Urology;;    Prior to Admission medications   Medication Sig Start Date End Date Taking? Authorizing Provider  Amino Acids-Protein Hydrolys (FEEDING SUPPLEMENT, PRO-STAT SUGAR FREE 64,) LIQD Take 30 mLs by mouth 3 (three) times daily. 10/14/18  Yes Hongalgi, Lenis Dickinson, MD  feeding supplement, ENSURE ENLIVE, (ENSURE ENLIVE) LIQD Take 237 mLs by mouth daily. 10/14/18  Yes Hongalgi, Lenis Dickinson, MD  finasteride (PROSCAR) 5 MG tablet Take 5 mg by mouth daily. 02/12/18  Yes [provider]  furosemide (LASIX) 40 MG tablet Take 40 mg by mouth daily.   Yes [provider]  gabapentin (NEURONTIN) 300 MG capsule Take 1-2 capsules (300-600 mg total) by mouth 3 (three) times daily. 330m  in am and 6050min evening Patient taking differently: Take 600 mg by mouth 3 (three) times daily.  05/31/19  Yes Shadad, FiMathis DadMD  HYDROcodone-acetaminophen (NORCO/VICODIN) 5-325 MG tablet TAKE 1 TO 2 TABLETS BY MOUTH EVERY 4 TO 6 HOURS AS NEEDED FOR PAIN (MAX OF 8 TABLETS IN 24 HOURS) Patient taking differently: Take 1-2 tablets by mouth every 4 (four) hours as needed for moderate pain or severe pain. TAKE 1 TO 2 TABLETS BY MOUTH EVERY 4 TO 6 HOURS AS NEEDED FOR PAIN (MAX OF 8 TABLETS IN 24 HOURS) 06/15/19  Yes ShWyatt PortelaMD  metFORMIN (GLUCOPHAGE-XR) 500 MG 24 hr tablet Take 500-1,000 mg by mouth 2 (two) times daily. Take 2 tablets (1000 mg) in the am and Take 1 tablet at bedtime 01/12/19  Yes [provider]  Multiple Vitamin (MULTIVITAMIN WITH MINERALS) TABS tablet Take 1 tablet by mouth daily. 10/15/18  Yes Hongalgi, AnLenis DickinsonMD  predniSONE (DELTASONE) 5 MG tablet Take 1 tablet (5 mg total) by mouth daily with breakfast. 11/03/18  Yes Shadad, FiMathis DadMD  tamsulosin (FLOMAX) 0.4 MG CAPS capsule Take 2 capsules (0.8 mg total) by mouth daily after supper. 10/14/18  Yes Hongalgi, AnLenis DickinsonMD  vitamin B-12 (CYANOCOBALAMIN) 1000 MCG tablet Take 1 tablet (1,000 mcg total) by mouth daily. 10/14/18  Yes Hongalgi, AnLenis DickinsonMD  ZYTIGA 250 MG tablet TAKE 4 TABLETS BY MOUTH ONCE DAILY AS DIRECTED.  TAKE 1 HOUR BEFORE OR 2 HOURS AFTER A MEAL Patient taking differently: Take 1,000 mg by mouth daily.  12/18/18  Yes ShWyatt PortelaMD  Blood Glucose Monitoring Suppl (RELION CONFIRM GLUCOSE MONITOR) w/Device KIT Use 4 times daily before meals and bedtime as directed. 07/31/16   StHolley RaringMD  glucose blood (RELION GLUCOSE TEST STRIPS) test strip Use as instructed 07/31/16   StHolley RaringMD  Lancets 30G MISC Use 4 times daily before meals as directed. 07/31/16   StHolley RaringMD    Scheduled Meds: . Chlorhexidine Gluconate Cloth  6 each Topical Daily  . mouth rinse  15 mL Mouth Rinse BID  .  pantoprazole (PROTONIX) IV  40 mg Intravenous Q12H   Infusions: . sodium chloride Stopped (06/18/2019 1846)  . sodium chloride 150 mL/hr at 06/24/19 [id number]. cefTRIAXone (ROCEPHIN)  IV Stopped (06/24/19 0905)   PRN Meds: acetaminophen **OR** acetaminophen, morphine injection, ondansetron **OR** ondansetron (ZOFRAN) IV   Allergies as of 07/09/2019  . (No Known Allergies)    Family History  Problem Relation Age of Onset  . Prostate cancer Father   . Heart disease Father   . Prostate cancer Paternal Grandfather     Social History   Socioeconomic History  . Marital status: Divorced    Spouse name:  Not on file  . Number of children: 1  . Years of education: Not on file  . Highest education level: Not on file  Occupational History  . Occupation: Teaching laboratory technician  Tobacco Use  . Smoking status: Former Smoker    Packs/day: 1.50    Years: 42.00    Pack years: 63.00    Types: Cigarettes    Quit date: 2015    Years since quitting: 6.1  . Smokeless tobacco: Never Used  . Tobacco comment: 2015  Substance and Sexual Activity  . Alcohol use: No  . Drug use: No  . Sexual activity: Not Currently  Other Topics Concern  . Not on file  Social History Narrative   The patient is separated he has 1 son, he is a Academic librarian.   Quit smoking 2015, no alcohol no caffeine no tobacco or drug use   No health insurance   Social Determinants of Health   Financial Resource Strain:   . Difficulty of Paying Living Expenses:   Food Insecurity:   . Worried About Charity fundraiser in the Last Year:   . Arboriculturist in the Last Year:   Transportation Needs:   . Film/video editor (Medical):   [name] Lack of Transportation (Non-Medical):   Physical Activity:   . Days of Exercise per Week:   . Minutes of Exercise per Session:   Stress:   . Feeling of Stress :   Social Connections:   . Frequency of Communication with Friends and Family:   .  Frequency of Social Gatherings with Friends and Family:   . Attends Religious Services:   . Active Member of Clubs or Organizations:   . Attends Archivist Meetings:   [name] Marital Status:   Intimate Partner Violence:   . Fear of Current or Ex-Partner:   . Emotionally Abused:   [name] Physically Abused:   . Sexually Abused:     REVIEW OF SYSTEMS: Constitutional: Some weakness but able to ambulate around his house. ENT:  No nose bleeds Pulm: Dyspnea on exertion but not at rest. CV:   For palpitations, no LE edema.  No chest pressure. GU:  No hematuria, no frequency GI: See HPI. Heme: Denies unusual or excessive bleeding or bruising. Transfusions: See HPI. Neuro:  No headaches, no peripheral tingling or numbness Derm:  No itching, no rash or sores.  Endocrine:  No sweats or chills.  No polyuria or dysuria Immunization: Reviewed. Travel:  None beyond local counties in last few months.    PHYSICAL EXAM: Vital signs in last 24 hours: Vitals:   06/24/19 0900 06/24/19 1000  BP: 125/83 115/66  Pulse: (!) 135 (!) 132  Resp: (!) 24 (!) 21  Temp:    SpO2: 98% 97%   Wt Readings from Last 3 Encounters:  06/24/19 98.5 kg  06/10/19 103.1 kg  05/31/19 106.4 kg    General: Overweight, somewhat chronically ill looking but not toxic. Head: No facial asymmetry or swelling, no signs of head trauma. Eyes: No scleral icterus, conjunctiva pink. Ears: Not hard of hearing Nose: No discharge or congestion Mouth: Slightly dry oral mucosa. Nonspecific small white splotches on the tongue. Tongue midline. Fair dentition, most teeth present. Neck: No JVD, no masses, no thyromegaly. Lungs: Clear bilaterally. No labored breathing Heart: Tachycardic with regular rhythm. No MRG. Sinus tachycardia at 132 on telemetry monitor. Abdomen: Soft. Minimal tenderness bilaterally in the lower abdomen, no guarding or rebound. Not distended.  Active bowel sounds. No HSM, masses, bruits, hernias.   Rectal:  Deferred. Stool was FOBT positive in the lab. Brown formed stool observed by nurse today. GU: Foley catheter in place, about 850 cc present. Musc/Skeltl: No joint redness or swelling. No gross joint deformities. Extremities: Bilateral 1+ pitting edema in the ankles/feet. 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 Oriented to self, place. Wasn't able to tell me the exact year. Required repeated cues before he followed some commands. Able to move all 4 limbs, strength not tested. No tremors or gross deficits. Skin: No rashes, no sores. Tattoos: None. Nodes: No cervical adenopathy Psych: Calm, cooperative, flat affect.  Intake/Output from previous day: 03/10 0701 - 03/11 0700 In: 1311.2 [I.V.:636.6; Blood:674.6] Out: 100 [Urine:100] Intake/Output this shift: Total I/O In: -  Out: 1000 [Urine:1000]  LAB RESULTS: Recent Labs    06/27/2019 1714 06/24/19 0112  WBC 11.8* 9.6  HGB 6.4* 5.7*  HCT 22.6* 20.2*  PLT 271 372   BMET Lab Results  Component Value Date   NA 146 (H) 06/24/2019   NA 139 07/05/2019   NA 138 05/27/2019   K 4.4 06/24/2019   K 4.6 07/04/2019   K 3.5 05/27/2019   CL 108 06/24/2019   CL 100 07/09/2019   CL 92 (L) 05/27/2019   CO2 24 06/24/2019   CO2 27 07/13/2019   CO2 33 (H) 05/27/2019   GLUCOSE 141 (H) 06/24/2019   GLUCOSE 182 (H) 06/21/2019   GLUCOSE 248 (H) 05/27/2019   BUN 61 (H) 06/24/2019   BUN 59 (H) 07/12/2019   BUN 38 (H) 05/27/2019   CREATININE 3.79 (H) 06/24/2019   CREATININE 3.54 (H) 07/12/2019   CREATININE 2.38 (H) 05/27/2019   CALCIUM 8.2 (L) 06/24/2019   CALCIUM 8.7 (L) 07/05/2019   CALCIUM 8.8 (L) 05/27/2019   LFT Recent Labs    07/07/2019 1714 06/24/19 0858  PROT 7.3 6.5  ALBUMIN 2.2* 2.0*  AST 33 28  ALT 28 23  ALKPHOS 224* 186*  BILITOT 0.8 1.0   PT/INR No results found for: INR, PROTIME Hepatitis Panel No results for input(s): HEPBSAG, HCVAB, HEPAIGM, HEPBIGM in the last 72 hours. C-Diff No  components found for: CDIFF Lipase  No results found for: LIPASE  Drugs of Abuse     Component Value Date/Time   LABOPIA POSITIVE (A) 07/01/2019 1537   COCAINSCRNUR NONE DETECTED 07/13/2019 1537   LABBENZ NONE DETECTED 06/15/2019 1537   AMPHETMU NONE DETECTED 07/13/2019 1537   THCU NONE DETECTED 07/03/2019 1537   LABBARB NONE DETECTED 06/20/2019 1537     RADIOLOGY STUDIES: DG Chest 2 View  Result Date: 06/27/2019 CLINICAL DATA:  Confusion. Decreased oxygen saturation. Weakness. History of prostate cancer. EXAM: CHEST - 2 VIEW COMPARISON:  Most recent radiograph 06/10/2019 FINDINGS: Upper normal heart size with unchanged mediastinal contours. Decreased right pleural effusion from prior exam. No definite acute airspace disease, blastic osseous lesions partially obscure evaluation of the underlying pulmonary parenchyma. No pulmonary edema. No pneumothorax. Multifocal blastic osseous metastatic disease, as seen on prior exam. Prior left fifth and sixth rib fractures. IMPRESSION: 1. Decreased right pleural effusion from prior exam. No definite acute abnormality. 2. Multifocal blastic osseous metastatic disease. Electronically Signed   By: Keith Rake M.D.   On: 06/28/2019 16:45   CT Head Wo Contrast  Result Date: 06/30/2019 CLINICAL DATA:  Delirium.  History of prostate cancer and seizures. EXAM: CT HEAD WITHOUT CONTRAST TECHNIQUE: Contiguous axial images were obtained from the  base of the skull through the vertex without intravenous contrast. COMPARISON:  MRI 05/20/2019 FINDINGS: Brain: The brain shows a normal appearance without evidence of malformation, atrophy, old or acute small or large vessel infarction, mass lesion, hemorrhage, hydrocephalus or extra-axial collection. Vascular: No hyperdense vessel. No evidence of atherosclerotic calcification. Skull: Sclerotic and mixed lytic changes of C1, the skull base and calvarium consistent with the patient's known osseous metastatic disease.  Sinuses/Orbits: Sinuses are clear. Orbits appear normal. Mastoids are clear. Other: None significant IMPRESSION: Normal appearance of the brain itself. Known metastatic disease of the bone. Electronically Signed   By: Nelson Chimes M.D.   On: 07/11/2019 17:05   CT RENAL STONE STUDY  Result Date: 06/18/2019 CLINICAL DATA:  History of prostate carcinoma with renal failure EXAM: CT ABDOMEN AND PELVIS WITHOUT CONTRAST TECHNIQUE: Multidetector CT imaging of the abdomen and pelvis was performed following the standard protocol without IV contrast. COMPARISON:  12/23/2018 FINDINGS: Lower chest: Lung bases demonstrate small left pleural effusion and consolidation improved when compared with the prior exam. No new focal infiltrate is seen. Decreased attenuation is noted in the cardiac blood pool suggestive of anemia. Hepatobiliary: Liver is within normal limits. The gallbladder is well distended with a single dependent gallstone in the gallbladder neck. No pericholecystic fluid is seen. Pancreas: Unremarkable. No pancreatic ductal dilatation or surrounding inflammatory changes. Spleen: Normal in size without focal abnormality. Adrenals/Urinary Tract: Adrenal glands are within normal limits bilaterally. Severe hydronephrosis is noted bilaterally. This extends to the level of the urinary bladder. The bladder is partially distended. Considerable in growth of the known prostate carcinoma is noted inferiorly contributing to the hydronephrosis. Bladder wall thickening is noted likely related to a degree of bladder outlet obstruction. Stomach/Bowel: The appendix is within normal limits. The colon shows no obstructive or inflammatory changes. No small bowel or gastric abnormality is noted. Vascular/Lymphatic: Atherosclerotic calcifications of the aorta and iliac vessels are seen. No sizable lymphadenopathy is noted. Reproductive: Prostate again demonstrates in growth of tumor into the inferior aspect of bladder. This appears  increased when compared with the prior exam measuring approximately 6.3 x 4.4 cm. Other: No abdominal wall hernia or abnormality. No abdominopelvic ascites. Musculoskeletal: Sclerotic bony metastatic disease is noted IMPRESSION: Enlarging prostate neoplasm with further ingrowth into the inferior aspect of the bladder with resulting severe hydronephrosis bilaterally. The degree of hydronephrosis is relatively stable when compared with the prior exam. Cholelithiasis. Right lower lobe consolidation with right-sided effusion improved from the prior exam. No other focal abnormality is noted. Electronically Signed   By: Inez Catalina M.D.   On: 06/29/2019 21:07      IMPRESSION:   *   Normocytic anemia, Hgb 5.7.  FOBT +.   2 PRBCs in mid 05/2019.  2 units PRBCs early this morning awaiting recheck Hgb.  *   Prostate cancer.  Status post orchiectomy.  Radiation treatment for bone mets.  Bilateral hydronephrosis due to invasion into the bladder/ureter.    *   Progressive CKD, AKI.  This is certainly contributing to his anemia. This is associated with bilateral hydronephrosis, urology unable to place retrograde stents and patient declined nephrostomy tubes.  *    Elevated alkaline phosphatase, otherwise normal LFTs.  This is due to bone mets.  *    SinusTachycardia with heart rates in the 120s to 130s, and as high as 140,  consistently since admission.  ? If persistent, will this be impediment to sedation for GI procedures?[name]    *  Fever.  102.4 Fahrenheit at 1 AM this morning.  WBC max 11.8 last evening, 9.6 today.  Bacteriuria, pyuria, large leukocytes on urinalysis.Ceftriaxone in place, day 2.  *   Multifactorial encephalopathy. Has been going on for at least a few weeks if not months. 05/20/2019 MRI of brain unrevealing    PLAN:     *   Colonoscopy and EGD tomorrow. Clears today, prep w dulcolax po, moviprep, reglan.   Due to patient's encephalopathy and at his request, I spoke with his mother  about the procedures, explained risks and reasons for pursuing these tests. She is agreeable to sign consent but planned to involve the patient's son in the decision-making as well. She plan to call the son to explain procedures. Should be able to get verbal if not in person consent from either the mom or the patient's son.  *    Check Hgb and hematocrit now, CBC in the morning.   Depending on this afternoon's Hb, may need additional PRBC.   Azucena Freed  06/24/2019, 10:29 AM Phone [phone]    Attending physician's note   I have taken a history, examined the patient and reviewed the chart. I agree with the Advanced Practitioner's note, impression and recommendations.  59 year old male with diabetes, stage IV prostate cancer s/p TURP and orchiectomy June 2020 on chemotherapy with worsening anemia and fecal Hemoccult positive  We will schedule for EGD and colonoscopy to evaluate for possible source of GI blood loss that could be contributing to worsening anemia.  Therapeutic intervention during endoscopy as needed. The risks and benefits as well as alternatives of endoscopic procedure(s) have been discussed and reviewed. All questions answered. The patient agrees to proceed.  Monitor hemoglobin and transfuse as needed On ceftriaxone for pyelonephritis  K. Denzil Magnuson , MD [phone]

## 2019-06-24 NOTE — Progress Notes (Signed)
PROGRESS NOTE    Jeffery [name]  EGB:[id number] DOB: 1960-07-13 DOA: 07/10/2019 PCP: [name], MD    Brief Narrative:  Patient is a 59 year old gentleman with metastatic prostate cancer, diabetes, hyperlipidemia, chronic kidney disease stage IV with baseline creatinine about 2 who presents to the emergency room with generalized weakness, hypoxia and tachycardia, painful urination.  Patient also has chronic anemia and chronic guaiac positive stools.  He was working with therapies at home, he was found shaky and tremulous, oncology office was called who directed him to the ER. In the emergency room, temperature 100.6, blood pressure stable.  Heart rate 140.  95% on room air.  WC count 11.8.  Hemoglobin 6.4.  Platelets were adequate.  BUN/creatinine was 59/3.54.  Patient was started on resuscitation, blood transfusions and admitted.   A CT scan shows invasive prostate cancer into the bladder with severe bilateral hydronephrosis.  Assessment & Plan:   Principal Problem:   GI bleed Active Problems:   Urinary retention   AKI (acute kidney injury) ([place])   Cholelithiasis   Diabetes mellitus, new onset ([place])   CKD (chronic kidney disease) stage 3, GFR 30-59 ml/min  Sepsis, present on admission suspect from urinary source: Blood pressures are stable.  Patient is still tachycardic.  Eating adequately resuscitated. 2 more liter of bolus fluid now.  Continue maintenance IV fluids.  Intake and output monitoring.  Foley catheter. Urine cultures pending. Blood cultures with preliminary results 1 out of 2 gram-positive cocci in chains, on Rocephin 2 g daily that we will continue. Bilateral hydronephrosis, will need relief of pressure.  Patient did not agree for nephrostomy, he wanted me to talk to his nephrologist. Due to significant worsening renal functions and hydronephrosis, he will benefit with Foley catheter. Given 1 dose of Rocephin in the ER, will start patient on Rocephin 2 g  IV daily until cultures are available.  Acute kidney injury on chronic kidney disease stage IV: Outpatient available renal functions with creatinine about 2.  Patient also has significant obstruction and now with sepsis. Foley catheter, intake output monitoring.  Additional isotonic fluid for resuscitation. Discussed with his nephrologist, however patient will need Foley catheter and probably will need bilateral nephrostomy tubes.  Anemia of chronic disease, positive FOBT: Probably acute on chronic anemia.  Patient does need GI evaluation.  We will start patient on Protonix twice a day IV.  Patient received 2 units of PRBC in the morning for his symptomatic anemia.  Recheck hemoglobin pending.  Transfuse for less than 8.  Seen by GI, probably will need endoscopic evaluation EGD colonoscopy.  Type 2 diabetes: Controlled.  On Metformin at home.  Discontinue all oral hypoglycemics.  Will keep on sliding scale insulin.  Metastatic prostate cancer, bladder obstruction, debility and chronic anemia: Followed by his oncologist.  I also called and discussed case with his urologist for follow-up.  Suggested Foley catheter at this time pending discussion about nephrostomy.  Patient is on outpatient chemotherapy.  Case discussed with urology, oncology. Will discuss with his nephrologist.  DVT prophylaxis: DVT prophylaxis with SCDs Code Status: Full code, patient on outpatient palliative therapy follow-up as per reports.  I will consult palliative care medicine to coordinate care. Family Communication: None. Disposition Plan: patient is from home.. Anticipated DC to home, Barriers to discharge active treatment, antibiotics, inpatient procedures.   Consultants:   Urology  GI  Oncology  Palliative medicine  Procedures:   None  Antimicrobials:  Antibiotics Given (last 72 hours)  Date/Time Action Medication Dose Rate   07/10/2019 1837 New Bag/Given   cefTRIAXone (ROCEPHIN) 1 g in sodium  chloride 0.9 % 100 mL IVPB 1 g 200 mL/hr   06/24/19 0834 New Bag/Given   cefTRIAXone (ROCEPHIN) 2 g in sodium chloride 0.9 % 100 mL IVPB 2 g 200 mL/hr         Subjective: Patient was seen and examined.  Sleepy and lethargic.  Still with tremors.  He was not able to talk because of dry tongue and throat.  Feels extremely weak.  Denies any chest pain or shortness of breath. Blood pressures are adequate.  He is tachycardic with sinus tachycardia in the monitor. Feels pressure sensation on the suprapubic area.  He thinks he is urinating normally.  Objective: Vitals:   06/24/19 1000 06/24/19 1100 06/24/19 1113 06/24/19 1142  BP: 115/66 119/66    Pulse: (!) 132 (!) 133    Resp: (!) 21 20    Temp:    100 F (37.8 C)  TempSrc:    Oral  SpO2: 97% 94%    Weight:      Height:   5\' 9"  (1.753 m)     Intake/Output Summary (Last 24 hours) at 06/24/2019 1211 Last data filed at 06/24/2019 1023 Gross per 24 hour  Intake 1311.18 ml  Output 1100 ml  Net 211.18 ml   Filed Weights   06/24/19 0051  Weight: 98.5 kg    Examination:  General exam: Sick looking, pale, sleepy and lethargic. Respiratory system: Clear to auscultation. Respiratory effort normal.  No added sounds. Cardiovascular system: S1 & S2 heard, tachycardic.  RRR. No JVD, murmurs, rubs, gallops or clicks. No pedal edema. Gastrointestinal system: Abdomen is nondistended, soft and patient does have suprapubic tenderness, palpable bladder.  No organomegaly or masses felt. Normal bowel sounds heard. Central nervous system: Sleepy.  Lethargic.  Can hardly keep up with conversations. No focal neurological deficits. Extremities: Symmetric 5 x 5 power. Skin: No rashes, lesions or ulcers Psychiatry: Judgement and insight appear normal. Mood & affect appropriate.  Condom catheter with clear urine.   Data Reviewed: I have personally reviewed following labs and imaging studies  CBC: Recent Labs  Lab 06/28/2019 1714 06/24/19 0112    WBC 11.8* 9.6  NEUTROABS 9.3*  --   HGB 6.4* 5.7*  HCT 22.6* 20.2*  MCV 88.6 89.0  PLT 271 245   Basic Metabolic Panel: Recent Labs  Lab 07/01/2019 1714 06/24/19 0858  NA 139 146*  K 4.6 4.4  CL 100 108  CO2 27 24  GLUCOSE 182* 141*  BUN 59* 61*  CREATININE 3.54* 3.79*  CALCIUM 8.7* 8.2*   GFR: Estimated Creatinine Clearance: 25.2 mL/min (A) (by C-G formula based on SCr of 3.79 mg/dL (H)). Liver Function Tests: Recent Labs  Lab 06/19/2019 1714 06/24/19 0858  AST 33 28  ALT 28 23  ALKPHOS 224* 186*  BILITOT 0.8 1.0  PROT 7.3 6.5  ALBUMIN 2.2* 2.0*   No results for input(s): LIPASE, AMYLASE in the last 168 hours. Recent Labs  Lab 07/09/2019 1714  AMMONIA 14   Coagulation Profile: No results for input(s): INR, PROTIME in the last 168 hours. Cardiac Enzymes: No results for input(s): CKTOTAL, CKMB, CKMBINDEX, TROPONINI in the last 168 hours. BNP (last 3 results) No results for input(s): PROBNP in the last 8760 hours. HbA1C: Recent Labs    06/24/19 0854  HGBA1C 6.6*   CBG: No results for input(s): GLUCAP in the last 168 hours. Lipid Profile:  No results for input(s): CHOL, HDL, LDLCALC, TRIG, CHOLHDL, LDLDIRECT in the last 72 hours. Thyroid Function Tests: No results for input(s): TSH, T4TOTAL, FREET4, T3FREE, THYROIDAB in the last 72 hours. Anemia Panel: No results for input(s): VITAMINB12, FOLATE, FERRITIN, TIBC, IRON, RETICCTPCT in the last 72 hours. Sepsis Labs: Recent Labs  Lab 06/14/2019 1714 07/09/2019 1825  LATICACIDVEN 1.0 0.8    Recent Results (from the past 240 hour(s))  Culture, blood (routine x 2)     Status: None (Preliminary result)   Collection Time: 06/28/2019  4:56 PM   Specimen: BLOOD  Result Value Ref Range Status   Specimen Description   Final    BLOOD RIGHT ANTECUBITAL Performed at [facility] [address] [place], [place] [address]    Special Requests   Final    BOTTLES DRAWN AEROBIC ONLY Blood Culture  results may not be optimal due to an excessive volume of blood received in culture bottles Performed at [facility] [address] [place], [place] [address]    Culture   Final    NO GROWTH < 12 HOURS Performed at [facility] [address], [place], [place] [address]    Report Status PENDING  Incomplete  Culture, blood (routine x 2)     Status: None (Preliminary result)   Collection Time: 07/07/2019  5:16 PM   Specimen: BLOOD  Result Value Ref Range Status   Specimen Description   Final    BLOOD RIGHT ANTECUBITAL Performed at [facility] [address] [place], [place] [address]    Special Requests   Final    BOTTLES DRAWN AEROBIC AND ANAEROBIC Blood Culture adequate volume Performed at [facility] [address], [place], [place] [address]    Culture  Setup Time   Final    GRAM POSITIVE COCCI IN CHAINS ANAEROBIC BOTTLE ONLY CRITICAL RESULT CALLED TO, READ BACK BY AND VERIFIED WITH: Minette Brine [id number] Performed at [facility], [place] [address] [place],  [address]    Culture GRAM POSITIVE COCCI  Final   Report Status PENDING  Incomplete  SARS CORONAVIRUS 2 (TAT 6-24 HRS) Nasopharyngeal Nasopharyngeal Swab     Status: None   Collection Time: 07/10/2019  5:20 PM   Specimen: Nasopharyngeal Swab  Result Value Ref Range Status   SARS Coronavirus 2 NEGATIVE NEGATIVE Final    Comment: (NOTE) SARS-CoV-2 target nucleic acids are NOT DETECTED. The SARS-CoV-2 RNA is generally detectable in upper and lower respiratory specimens during the acute phase of infection. Negative results do not preclude SARS-CoV-2 infection, do not rule out co-infections with other pathogens, and should not be used as the sole basis for treatment or other patient management decisions. Negative results must be combined with clinical observations, patient history, and epidemiological information. The expected result is  Negative. Fact Sheet for Patients: SugarRoll.be Fact Sheet for Healthcare Providers: https://www.woods-mathews.com/ This test is not yet approved or cleared by the [place] FDA and  has been authorized for detection and/or diagnosis of SARS-CoV-2 by FDA under an Emergency Use Authorization (EUA). This EUA will remain  in effect (meaning this test can be used) for the duration of the COVID-19 declaration under Section 56 4(b)(1) of the Act, 21 U.S.C. section 360bbb-3(b)(1), unless the authorization is terminated or revoked sooner. Performed at [facility], [place] [address], [place],  [address]   MRSA PCR Screening     Status: None  Collection Time: 06/24/19  1:08 AM   Specimen: Nasal Mucosa; Nasopharyngeal  Result Value Ref Range Status   MRSA by PCR NEGATIVE NEGATIVE Final    Comment:        The GeneXpert MRSA Assay (FDA approved for NASAL specimens only), is one component of a comprehensive MRSA colonization surveillance program. It is not intended to diagnose MRSA infection nor to guide or monitor treatment for MRSA infections. Performed at [facility], [place] [address], [place], [place] [address]          Radiology Studies: DG Chest 2 View  Result Date: 06/17/2019 CLINICAL DATA:  Confusion. Decreased oxygen saturation. Weakness. History of prostate cancer. EXAM: CHEST - 2 VIEW COMPARISON:  Most recent radiograph 06/10/2019 FINDINGS: Upper normal heart size with unchanged mediastinal contours. Decreased right pleural effusion from prior exam. No definite acute airspace disease, blastic osseous lesions partially obscure evaluation of the underlying pulmonary parenchyma. No pulmonary edema. No pneumothorax. Multifocal blastic osseous metastatic disease, as seen on prior exam. Prior left fifth and sixth rib fractures. IMPRESSION: 1. Decreased right pleural effusion from prior exam. No definite acute  abnormality. 2. Multifocal blastic osseous metastatic disease. Electronically Signed   By: Keith Rake M.D.   On: 06/30/2019 16:45   CT Head Wo Contrast  Result Date: 06/17/2019 CLINICAL DATA:  Delirium.  History of prostate cancer and seizures. EXAM: CT HEAD WITHOUT CONTRAST TECHNIQUE: Contiguous axial images were obtained from the base of the skull through the vertex without intravenous contrast. COMPARISON:  MRI 05/20/2019 FINDINGS: Brain: The brain shows a normal appearance without evidence of malformation, atrophy, old or acute small or large vessel infarction, mass lesion, hemorrhage, hydrocephalus or extra-axial collection. Vascular: No hyperdense vessel. No evidence of atherosclerotic calcification. Skull: Sclerotic and mixed lytic changes of C1, the skull base and calvarium consistent with the patient's known osseous metastatic disease. Sinuses/Orbits: Sinuses are clear. Orbits appear normal. Mastoids are clear. Other: None significant IMPRESSION: Normal appearance of the brain itself. Known metastatic disease of the bone. Electronically Signed   By: Nelson Chimes M.D.   On: 06/30/2019 17:05   CT RENAL STONE STUDY  Result Date: 06/17/2019 CLINICAL DATA:  History of prostate carcinoma with renal failure EXAM: CT ABDOMEN AND PELVIS WITHOUT CONTRAST TECHNIQUE: Multidetector CT imaging of the abdomen and pelvis was performed following the standard protocol without IV contrast. COMPARISON:  12/23/2018 FINDINGS: Lower chest: Lung bases demonstrate small left pleural effusion and consolidation improved when compared with the prior exam. No new focal infiltrate is seen. Decreased attenuation is noted in the cardiac blood pool suggestive of anemia. Hepatobiliary: Liver is within normal limits. The gallbladder is well distended with a single dependent gallstone in the gallbladder neck. No pericholecystic fluid is seen. Pancreas: Unremarkable. No pancreatic ductal dilatation or surrounding inflammatory  changes. Spleen: Normal in size without focal abnormality. Adrenals/Urinary Tract: Adrenal glands are within normal limits bilaterally. Severe hydronephrosis is noted bilaterally. This extends to the level of the urinary bladder. The bladder is partially distended. Considerable in growth of the known prostate carcinoma is noted inferiorly contributing to the hydronephrosis. Bladder wall thickening is noted likely related to a degree of bladder outlet obstruction. Stomach/Bowel: The appendix is within normal limits. The colon shows no obstructive or inflammatory changes. No small bowel or gastric abnormality is noted. Vascular/Lymphatic: Atherosclerotic calcifications of the aorta and iliac vessels are seen. No sizable lymphadenopathy is noted. Reproductive: Prostate again demonstrates in growth of tumor into the inferior aspect of  bladder. This appears increased when compared with the prior exam measuring approximately 6.3 x 4.4 cm. Other: No abdominal wall hernia or abnormality. No abdominopelvic ascites. Musculoskeletal: Sclerotic bony metastatic disease is noted IMPRESSION: Enlarging prostate neoplasm with further ingrowth into the inferior aspect of the bladder with resulting severe hydronephrosis bilaterally. The degree of hydronephrosis is relatively stable when compared with the prior exam. Cholelithiasis. Right lower lobe consolidation with right-sided effusion improved from the prior exam. No other focal abnormality is noted. Electronically Signed   By: Inez Catalina M.D.   On: 06/27/2019 21:07        Scheduled Meds: . bisacodyl  20 mg Oral Once  . Chlorhexidine Gluconate Cloth  6 each Topical Daily  . insulin aspart  0-9 Units Subcutaneous Q6H  . mouth rinse  15 mL Mouth Rinse BID  . metoCLOPramide (REGLAN) injection  10 mg Intravenous Once   Followed by  . metoCLOPramide (REGLAN) injection  10 mg Intravenous Once  . [START ON 06/16/2019] pantoprazole  40 mg Oral Q0600   Continuous  Infusions: . sodium chloride Stopped (06/26/2019 1846)  . sodium chloride 100 mL/hr at 06/24/19 1203  . cefTRIAXone (ROCEPHIN)  IV Stopped (06/24/19 0905)     LOS: 1 day    Time spent: 40 minutes    Barb Merino, MD [facility] Pager [phone]

## 2019-06-24 NOTE — Progress Notes (Signed)
Notified Dr. Sloan Leiter of the presence of small blood clots in patients foley bag. Urine was amber colored during catheter insertion and now it is pink colored with blood clots.

## 2019-06-24 NOTE — Progress Notes (Signed)
Chart was reviewed.  I am not on-call today but I will try to see the patient if I am able to get to the hospital.    He has acute blood loss anemia.  I would recommend controlling this prior to proceeding with any intervention for the kidneys.  If creatinine does not improve, I think he would likely benefit from bilateral nephrostomy tube placement.  His nephrologist is Dr. Justin Mend.  Recommend touching base with him as well.  The following is my most recent outpatient visit back in January:  CC/HPI: CC: History of urinary retention, bilateral hydronephrosis, metastatic prostate cancer  HPI:  08/04/2018  Patient has been seen in the past by our resident clinic for history of urinary retention. He initially failed a voiding trial, but then subsequent pass. His PSA in 2018 was normal. He did not follow-up for repeat PSA. He has a family history of prostate cancer. He has a history of diabetes. He has not undergone urodynamics. He is a self-pay patient. Last basic metabolic panel revealed a creatinine of 1.94 with a GFR of 36. Hemoglobin 13.2. Due to the renal insufficiency, he underwent a renal ultrasound which revealed mild to moderate right-sided hydronephrosis and hydroureter. He also had some minimal left hydronephrosis. There was irregular wall thickening in the posterior aspect of the bladder with a differential of bladder mass versus debris. He had a significant postvoid residual Within the bladder. He continues to have weak stream and incomplete emptying. He also has some abdominal fullness. He denies hematuria or dysuria. Urinalysis was negative.   09/17/2018  Patient presents today for prostate biopsy.   10/27/2018  Patient was recently hospitalized with acute renal insufficiency. He had a CT scan performed which showed widespread bony metastasis. He also had metastatic adenopathy. He is now status post bilateral simple orchiectomy on 10/11/2018. He is doing well in this regard. He also had a urethral  dilation for a panurethral stricture. Prostate wasn't really too enlarged. The bladder itself had severe edema and appearance of locally advanced prostate cancer. I was unable to visualize ureteral orifices. He continues with a Foley catheter.   11/05/2018: Returns for TOV. He has had f/u with Dr Alen Blew in the interval who recommended Zytiga for continued treatment of his metastatic disease, bone scan also recommended. Pt wanted to discuss with his PCP before proceeding with Zytiga and concurrent low dose prednisone therapy.   Catheter today continues to drain well. He only has urgency if collection bag is not positioned to gravity drainage. No interval f/c, no noted gross hematuria. He reports urine output has been good.   04/28/2019  Patient continues to be followed by Dr. Alen Blew and is currently taking Zytiga. Most recent PSA on 04/19/2019 was 19.1. Most recent creatinine is 2.16 which is relatively stable from his prior values but is slightly increased from prior. He does not have an interest in nephrostomy tubes at this point. I was unable to place retrograde stents. Hydronephrosis is persistent on renal ultrasound.     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 Prostate Cancer - 10/22/2018 Elevated PSA - 09/17/2018 Encounter for Prostate Cancer screening - 08/04/2018 Hydronephrosis - 08/04/2018 BPH w/LUTS (Stable) - 2018, - 2018 Family Hx of Prostate Cancer - 2018 Urinary Retention (Stable) - 2018, - 2018 BPH w/o LUTS, Benign Prostatic  Hypertrophy - 2014 Personal Hx Oth male genital organs diseases, History of epididymitis - 2014      PMH Notes:  1898-04-15 00:00:00 - Note: Normal Routine History And Physical Adult   NON-GU PMH: Personal history of other diseases of the nervous system and sense organs, History of sleep apnea - 2014 Diabetes Type 2 Sleep Apnea    FAMILY HISTORY: 1 son - Son Family Health Status Number - Father Prostate Cancer - Father   SOCIAL HISTORY: Marital Status: Divorced Preferred Language: English; Ethnicity: Not Hispanic Or Latino; Race: White Current Smoking Status: Patient does not smoke anymore. Has not smoked since 07/14/2012.   Tobacco Use Assessment Completed: Used Tobacco in last 30 days? Has never drank.  Does not drink caffeine.     Notes: Tobacco Use, Caffeine Use, Occupation:, Death In The Family Father, Alcohol Use, Marital History - Currently Married, Death In The Family Mother   REVIEW OF SYSTEMS:    GU Review Male:   Patient reports hard to postpone urination. Patient denies frequent urination, burning/ pain with urination, get up at night to urinate, leakage of urine, stream starts and stops, trouble starting your stream, have to strain to urinate , erection problems, and penile pain.  Gastrointestinal (Upper):   Patient denies nausea, vomiting, and indigestion/ heartburn.  Gastrointestinal (Lower):   Patient denies diarrhea and constipation.  Constitutional:   Patient denies fever, night sweats, weight loss, and fatigue.  Skin:   Patient denies skin rash/ lesion and itching.  Eyes:   Patient denies blurred vision and double vision.  Ears/ Nose/ Throat:   Patient denies sore throat and sinus problems.  Hematologic/Lymphatic:   Patient denies swollen glands and easy bruising.  Cardiovascular:   Patient denies leg swelling and chest pains.  Respiratory:   Patient denies cough and shortness of breath.  Endocrine:   Patient denies excessive thirst.  Musculoskeletal:   Patient  denies back pain and joint pain.  Neurological:   Patient denies headaches and dizziness.  Psychologic:   Patient denies depression and anxiety.   Notes: hematuria---very little arm numbness (L)    VITAL SIGNS:      04/28/2019 04:05 PM  BP 117/77 mmHg  Heart Rate 114 /min  Temperature 98.0 F / 36.6 C   MULTI-SYSTEM PHYSICAL EXAMINATION:    Constitutional: Well-nourished. No physical deformities. Normally developed. Good grooming.  Respiratory: No labored breathing, no use of accessory muscles.   Cardiovascular: Normal temperature, adequate perfusion of extremities  Skin: No paleness, no jaundice  Neurologic / Psychiatric: Oriented to time, oriented to place, oriented to person. No depression, no anxiety, no agitation.  Gastrointestinal: No mass, no tenderness, no rigidity, non obese abdomen.  Eyes: Normal conjunctivae. Normal eyelids.  Musculoskeletal: Normal gait and station of head and neck.     PAST DATA REVIEWED:  Source Of History:  Patient  Lab Test Review:   PSA, CMP  Records Review:   Previous Doctor Records, Previous Patient Records  Urine Test Review:   Urinalysis  X-Ray Review: Renal Ultrasound: Reviewed Films. Discussed With Patient.     08/20/18 04/29/06  PSA  Total PSA 410.00 ng/mL 0.81     PROCEDURES:  Renal Ultrasound - [id number]  Right Kidney: Length: 12.0 cm Depth:7.8 cm Cortical Width: 1.5 cm Width: 6.3 cm  Left Kidney: Length: 13.1 cm Depth:6.8 cm Cortical Width: 1.4 cm Width:5.4 cm  Left Kidney/Ureter:  SEVERE HYDRO W HYDROURETER-Renal pelvis 2.7 cm  Right Kidney/Ureter:  SEVERE HYDRO W HYDROURETER - Renal pelvis 2.7 cm - appears to be possible debris in prox ureter?  Bladder:  PVR 390 ml There is a irregular, solid mass seen posterior bladder wall that measures 2.9x2.5x3.1 cm. This mass has internal vascularity noted.      . Patient confirmed No Neulasta OnPro Device.           Urinalysis w/Scope Dipstick Dipstick Cont'd Micro  Color:  Yellow Bilirubin: Neg mg/dL WBC/hpf: 20 - 40/hpf  Appearance: Cloudy Ketones: Neg mg/dL RBC/hpf: 3 - 10/hpf  Specific Gravity: <=1.005 Blood: 3+ ery/uL Bacteria: Rare (0-9/hpf)  pH: <=5.0 Protein: Neg mg/dL Cystals: NS (Not Seen)  Glucose: Neg mg/dL Urobilinogen: 0.2 mg/dL Casts: NS (Not Seen)    Nitrites: Neg Trichomonas: Not Present    Leukocyte Esterase: 2+ leu/uL Mucous: Not Present      Epithelial Cells: 0 - 5/hpf      Yeast: NS (Not Seen)      Sperm: Not Present    ASSESSMENT:      ICD-10 Details  1 GU:   Prostate Cancer - C61 Chronic, Stable  2   Metastatic pelvic lymphadenopathy - C77.5 Chronic, Stable  3   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 Alen Blew.   I discussed bilateral nephrostomy tubes for his bilateral hydronephrosis and worsening creatinine. He is currently against this. He understands that he may have continued deterioration of renal function without them. I certainly would recommend them if he ends up needing to proceed with chemotherapy which Dr. Alen Blew is considering in the future

## 2019-06-24 NOTE — Progress Notes (Signed)
[facility] Documentation  Pt was scheduled to be admitted into [place] [facility] program tomorrow at 10am. [facility] will reschedule admission and liaison will follow to ensure follow up upon discharge.   Please reach out to [facility] should pt's needs change and he becomes hospice eligible. We are happy to admit him under hospice care pending his goals of care.   Thank you,  Freddie Breech, RN [facility] Liaison  [phone]

## 2019-06-24 NOTE — Progress Notes (Signed)
IP PROGRESS NOTE  Subjective:   Patient well-known to me with a history of advanced prostate cancer with disease to the bone.  He is currently on Zytiga and status post orchiectomy.  His PSA on February 11 was 0.2 indicating positive response to therapy and a drop from 19.8 in October 2020.  He was admitted with a shortness of breath and found to have worsening anemia fecal occult positive for blood.  CT scan of the abdomen and pelvis does show bilateral hydronephrosis as well as worsening renal failure.  He is currently receiving packed red cell transfusion and plan for endoscopy and colonoscopy in the next 24 hours.  Clinically, he is lethargic but arousable and answers questions appropriately.  He appears without distress at this time.  Objective:  Vital signs in last 24 hours: Temp:  [97.8 F (36.6 C)-102.4 F (39.1 C)] 100 F (37.8 C) (03/11 1142) Pulse Rate:  [118-140] 133 (03/11 1100) Resp:  [18-29] 20 (03/11 1100) BP: (96-144)/(51-128) 119/66 (03/11 1100) SpO2:  [94 %-100 %] 94 % (03/11 1100) Weight:  [217 lb 2.5 oz (98.5 kg)] 217 lb 2.5 oz (98.5 kg) (03/11 0051) Weight change:  Last BM Date: (unknown by patient)  Intake/Output from previous day: 03/10 0701 - 03/11 0700 In: 1311.2 [I.V.:636.6; Blood:674.6] Out: 100 [Urine:100] General: Alert, awake without distress. Head: Normocephalic atraumatic. Mouth: mucous membranes moist, pharynx normal without lesions Eyes: No scleral icterus.  Pupils are equal and round reactive to light. Resp: clear to auscultation bilaterally without rhonchi or wheezes or dullness to percussion. Cardio: regular rate and rhythm, S1, S2 normal, no murmur, click, rub or gallop GI: soft, non-tender; bowel sounds normal; no masses,  no organomegaly Musculoskeletal: No joint deformity or effusion. Neurological: No motor, sensory deficits.  Intact deep tendon reflexes. Skin: No rashes or lesions.    Lab Results: Recent Labs    06/24/19 0112  06/24/19 0858  WBC 9.6 10.2  HGB 5.7* 7.5*  HCT 20.2* 26.0*  PLT 372 336    BMET Recent Labs    07/07/2019 1714 06/24/19 0858  NA 139 146*  K 4.6 4.4  CL 100 108  CO2 27 24  GLUCOSE 182* 141*  BUN 59* 61*  CREATININE 3.54* 3.79*  CALCIUM 8.7* 8.2*     CT Head Wo Contrast  Result Date: 07/12/2019 CLINICAL DATA:  Delirium.  History of prostate cancer and seizures. EXAM: CT HEAD WITHOUT CONTRAST TECHNIQUE: Contiguous axial images were obtained from the base of the skull through the vertex without intravenous contrast. COMPARISON:  MRI 05/20/2019 FINDINGS: Brain: The brain shows a normal appearance without evidence of malformation, atrophy, old or acute small or large vessel infarction, mass lesion, hemorrhage, hydrocephalus or extra-axial collection. Vascular: No hyperdense vessel. No evidence of atherosclerotic calcification. Skull: Sclerotic and mixed lytic changes of C1, the skull base and calvarium consistent with the patient's known osseous metastatic disease. Sinuses/Orbits: Sinuses are clear. Orbits appear normal. Mastoids are clear. Other: None significant IMPRESSION: Normal appearance of the brain itself. Known metastatic disease of the bone. Electronically Signed   By: Nelson Chimes M.D.   On: 07/07/2019 17:05   CT RENAL STONE STUDY  Result Date: 06/22/2019 CLINICAL DATA:  History of prostate carcinoma with renal failure EXAM: CT ABDOMEN AND PELVIS WITHOUT CONTRAST TECHNIQUE: Multidetector CT imaging of the abdomen and pelvis was performed following the standard protocol without IV contrast. COMPARISON:  12/23/2018 FINDINGS: Lower chest: Lung bases demonstrate small left pleural effusion and consolidation improved when compared with  the prior exam. No new focal infiltrate is seen. Decreased attenuation is noted in the cardiac blood pool suggestive of anemia. Hepatobiliary: Liver is within normal limits. The gallbladder is well distended with a single dependent gallstone in the  gallbladder neck. No pericholecystic fluid is seen. Pancreas: Unremarkable. No pancreatic ductal dilatation or surrounding inflammatory changes. Spleen: Normal in size without focal abnormality. Adrenals/Urinary Tract: Adrenal glands are within normal limits bilaterally. Severe hydronephrosis is noted bilaterally. This extends to the level of the urinary bladder. The bladder is partially distended. Considerable in growth of the known prostate carcinoma is noted inferiorly contributing to the hydronephrosis. Bladder wall thickening is noted likely related to a degree of bladder outlet obstruction. Stomach/Bowel: The appendix is within normal limits. The colon shows no obstructive or inflammatory changes. No small bowel or gastric abnormality is noted. Vascular/Lymphatic: Atherosclerotic calcifications of the aorta and iliac vessels are seen. No sizable lymphadenopathy is noted. Reproductive: Prostate again demonstrates in growth of tumor into the inferior aspect of bladder. This appears increased when compared with the prior exam measuring approximately 6.3 x 4.4 cm. Other: No abdominal wall hernia or abnormality. No abdominopelvic ascites. Musculoskeletal: Sclerotic bony metastatic disease is noted IMPRESSION: Enlarging prostate neoplasm with further ingrowth into the inferior aspect of the bladder with resulting severe hydronephrosis bilaterally. The degree of hydronephrosis is relatively stable when compared with the prior exam. Cholelithiasis. Right lower lobe consolidation with right-sided effusion improved from the prior exam. No other focal abnormality is noted. Electronically Signed   By: Inez Catalina M.D.   On: 06/16/2019 21:07    Medications: I have reviewed the patient's current medications.  Assessment/Plan:  59 year old with:  1.  Advanced prostate cancer wi   LOS: 1 day   Zola Button 06/24/2019, 12:29 PM th lymphadenopathy as well as bone disease.  He is status post orchiectomy and  currently on Zytiga.  He is experiencing reasonable benefit from his current therapy.  I have no objections to holding Zytiga temporarily during his acute illness which can be resumed once he is clinically stable and discharge.  I do not feel that Fabio Asa has contributed to his acute illness at this time.  2.  Bilateral hydronephrosis: This is associated with worsening renal failure.  It is unclear whether his renal failure may be exacerbated by his recent GI bleed and dehydration.  Input from urology and nephrology would be helpful at this time.  Nephrostomy tube placement would be reasonable if felt that his kidney function continues to decline.  3.  Anemia: Appears to be related to GI loss as well as possibly worsening renal failure.  I agree with packed red cell transfusion.  4.  Prognosis and goals of care: He has an incurable malignancy but disease that is currently treatable.  His PSA is responding to treatment and aggressive measures reasonable at this time.  We will continue to follow his progress and address any oncology related issues in the interim.   25  minutes were spent on the encounter.  More than 50% of time was face-to-face was dedicated to reviewing laboratory data, imaging studies and addressing future plan of care.

## 2019-06-24 NOTE — Progress Notes (Addendum)
PHARMACY - PHYSICIAN COMMUNICATION CRITICAL VALUE ALERT - BLOOD CULTURE IDENTIFICATION (BCID)  Jeffery [name] is an 59 y.o. male who presented to [facility] on 06/19/2019 with a chief complaint of shortness of breath  Assessment:  59 yo M with metastatic prostate CA to bladder with severe hydronephrosis.  He was febrile on admission with elevated WBC.  Presumed urinary source of infection.  1/2 Blood cx + GPC in chains in anaerobic bottle only.  No BCID performed but likely strep that Rocephin will cover.  Urine cx pending.   Name of physician (or Provider) Contacted: K Ghimire (already aware per progress note  Current antibiotics: Rocephin 2gm IV q24h  Changes to prescribed antibiotics recommended:  Patient is on recommended antibiotics - No changes needed  No results found for this or any previous visit.  Netta Cedars PharmD 06/24/2019  12:50 PM

## 2019-06-24 NOTE — ED Notes (Addendum)
TEMP of 102.0 rectally. MD made aware. Will continue to monitor.

## 2019-06-24 NOTE — TOC Initial Note (Signed)
Transition of Care [facility]) - Initial/Assessment Note    Patient Details  Name: Jeffery [name] MRN: [id number] Date of Birth: Sep 14, 1960  Transition of [facility]) CM/SW Contact:    Leeroy Cha, RN Phone Number: 06/24/2019, 9:58 AM  Clinical Narrative:                 [facility] in [place]-[phone] if opatient needs home hospice.        Patient Goals and CMS Choice        Expected Discharge Plan and Services                                                Prior Living Arrangements/Services                       Activities of Daily Living      Permission Sought/Granted                  Emotional Assessment              Admission diagnosis:  Metabolic encephalopathy [Y63.78] Prostate cancer ([place]) [C61] GI bleed [K92.2] Hypoxia [R09.02] Left leg weakness [R29.898] AKI (acute kidney injury) ([place]) [N17.9] Urinary tract infection without hematuria, site unspecified [N39.0] Anemia, unspecified type [D64.9] Patient Active Problem List   Diagnosis Date Noted  . GI bleed 07/07/2019  . Seizure-like activity ([place]) 05/20/2019  . Metastatic cancer to spine ([place]) 04/20/2019  . Cervical radiculopathy 02/23/2019  . Pain in thoracic spine 02/23/2019  . Neck pain 02/18/2019  . Open fracture of base of distal phalanx of left thumb 02/04/2019  . Acute renal failure superimposed on stage 3 chronic kidney disease ([place]) 10/13/2018  . Vitamin B12 deficiency 10/13/2018  . Carcinoma of prostate ([place]) 10/13/2018  . Acute cystitis with hematuria   . Acute hyperkalemia   . DM2 (diabetes mellitus, type 2) ([place]) 10/12/2018  . Dysuria 10/12/2018  . CKD (chronic kidney disease) stage 3, GFR 30-59 ml/min 10/12/2018  . Hyperkalemia 10/12/2018  . Metabolic acidosis, normal anion gap (NAG) 10/12/2018  . Sinus tachycardia 07/31/2016  . Diabetes mellitus, new onset ([place])   . Hyperglycemia 07/29/2016  . Urinary  retention 07/29/2016  . AKI (acute kidney injury) ([place]) 07/29/2016  . Cholelithiasis 07/29/2016  . Fatty liver 07/29/2016  . Hydronephrosis 07/29/2016  . Hypertension 07/29/2016   PCP:  [name], MD Pharmacy:   [facility] [address] [place]), [place] - [place] [address] [place] ([place])  [address] Phone: [phone] Fax: [phone]  [place], [place] - [place] [address] Phone: [phone] Fax: [phone]  [id number] [place], [place] [address]  [place] [address] Phone: [phone] Fax: [phone]     Social Determinants of Health (SDOH) Interventions    Readmission Risk Interventions No flowsheet data found.

## 2019-06-25 ENCOUNTER — Inpatient Hospital Stay (HOSPITAL_COMMUNITY): Payer: Medicaid Other

## 2019-06-25 ENCOUNTER — Encounter (HOSPITAL_COMMUNITY): Payer: Self-pay | Admitting: Internal Medicine

## 2019-06-25 ENCOUNTER — Encounter (HOSPITAL_COMMUNITY): Payer: Self-pay | Admitting: Certified Registered"

## 2019-06-25 ENCOUNTER — Encounter (HOSPITAL_COMMUNITY): Admission: EM | Disposition: E | Payer: Self-pay | Source: Home / Self Care | Attending: Internal Medicine

## 2019-06-25 ENCOUNTER — Other Ambulatory Visit: Payer: Self-pay | Admitting: Internal Medicine

## 2019-06-25 DIAGNOSIS — N1330 Unspecified hydronephrosis: Secondary | ICD-10-CM

## 2019-06-25 DIAGNOSIS — R7881 Bacteremia: Secondary | ICD-10-CM

## 2019-06-25 DIAGNOSIS — R195 Other fecal abnormalities: Secondary | ICD-10-CM

## 2019-06-25 DIAGNOSIS — B952 Enterococcus as the cause of diseases classified elsewhere: Secondary | ICD-10-CM

## 2019-06-25 DIAGNOSIS — Z87891 Personal history of nicotine dependence: Secondary | ICD-10-CM

## 2019-06-25 DIAGNOSIS — N179 Acute kidney failure, unspecified: Secondary | ICD-10-CM

## 2019-06-25 DIAGNOSIS — C799 Secondary malignant neoplasm of unspecified site: Secondary | ICD-10-CM

## 2019-06-25 DIAGNOSIS — D500 Iron deficiency anemia secondary to blood loss (chronic): Secondary | ICD-10-CM

## 2019-06-25 DIAGNOSIS — C61 Malignant neoplasm of prostate: Secondary | ICD-10-CM

## 2019-06-25 LAB — TYPE AND SCREEN
ABO/RH(D): O POS
Antibody Screen: NEGATIVE
Unit division: 0
Unit division: 0

## 2019-06-25 LAB — COMPREHENSIVE METABOLIC PANEL
ALT: 22 U/L (ref 0–44)
AST: 29 U/L (ref 15–41)
Albumin: 2 g/dL — ABNORMAL LOW (ref 3.5–5.0)
Alkaline Phosphatase: 158 U/L — ABNORMAL HIGH (ref 38–126)
Anion gap: 13 (ref 5–15)
BUN: 57 mg/dL — ABNORMAL HIGH (ref 6–20)
CO2: 22 mmol/L (ref 22–32)
Calcium: 8.6 mg/dL — ABNORMAL LOW (ref 8.9–10.3)
Chloride: 119 mmol/L — ABNORMAL HIGH (ref 98–111)
Creatinine, Ser: 3.82 mg/dL — ABNORMAL HIGH (ref 0.61–1.24)
GFR calc Af Amer: 19 mL/min — ABNORMAL LOW (ref >60)
GFR calc non Af Amer: 17 mL/min — ABNORMAL LOW (ref >60)
Glucose, Bld: 113 mg/dL — ABNORMAL HIGH (ref 70–99)
Potassium: 4.2 mmol/L (ref 3.5–5.1)
Sodium: 154 mmol/L — ABNORMAL HIGH (ref 135–145)
Total Bilirubin: 0.5 mg/dL (ref 0.3–1.2)
Total Protein: 6.1 g/dL — ABNORMAL LOW (ref 6.5–8.1)

## 2019-06-25 LAB — BLOOD CULTURE ID PANEL (REFLEXED)

## 2019-06-25 LAB — CBC
HCT: 26.4 % — ABNORMAL LOW (ref 39.0–52.0)
Hemoglobin: 7.4 g/dL — ABNORMAL LOW (ref 13.0–17.0)
MCH: 26 pg (ref 26.0–34.0)
MCHC: 28 g/dL — ABNORMAL LOW (ref 30.0–36.0)
MCV: 92.6 fL (ref 80.0–100.0)
Platelets: 249 10*3/uL (ref 150–400)
RBC: 2.85 MIL/uL — ABNORMAL LOW (ref 4.22–5.81)
RDW: 18.6 % — ABNORMAL HIGH (ref 11.5–15.5)
WBC: 9.7 10*3/uL (ref 4.0–10.5)
nRBC: 0 % (ref 0.0–0.2)

## 2019-06-25 LAB — BPAM RBC
Blood Product Expiration Date: 202104112359
Blood Product Expiration Date: 202104112359
ISSUE DATE / TIME: 202103110249
ISSUE DATE / TIME: 202103110446
Unit Type and Rh: 5100
Unit Type and Rh: 5100

## 2019-06-25 LAB — MAGNESIUM: Magnesium: 2.1 mg/dL (ref 1.7–2.4)

## 2019-06-25 LAB — GLUCOSE, CAPILLARY
Glucose-Capillary: 129 mg/dL — ABNORMAL HIGH (ref 70–99)
Glucose-Capillary: 139 mg/dL — ABNORMAL HIGH (ref 70–99)
Glucose-Capillary: 140 mg/dL — ABNORMAL HIGH (ref 70–99)
Glucose-Capillary: 160 mg/dL — ABNORMAL HIGH (ref 70–99)

## 2019-06-25 LAB — PHOSPHORUS: Phosphorus: 5.7 mg/dL — ABNORMAL HIGH (ref 2.5–4.6)

## 2019-06-25 LAB — ECHOCARDIOGRAM COMPLETE
Height: 69 in
Weight: 3474.45 oz

## 2019-06-25 SURGERY — CANCELLED PROCEDURE

## 2019-06-25 MED ORDER — SODIUM CHLORIDE 0.9 % IV SOLN
2.0000 g | Freq: Four times a day (QID) | INTRAVENOUS | Status: DC
  Administered 2019-06-25 (×2): 2 g via INTRAVENOUS
  Filled 2019-06-25: qty 2000
  Filled 2019-06-25: qty 2
  Filled 2019-06-25: qty 2000

## 2019-06-25 MED ORDER — DIGOXIN 0.25 MG/ML IJ SOLN
0.2500 mg | Freq: Every day | INTRAMUSCULAR | Status: AC
  Administered 2019-06-25: 0.25 mg via INTRAVENOUS
  Filled 2019-06-25: qty 2

## 2019-06-25 MED ORDER — SODIUM CHLORIDE 0.9 % IV SOLN
2.0000 g | Freq: Three times a day (TID) | INTRAVENOUS | Status: DC
  Administered 2019-06-25 – 2019-07-05 (×31): 2 g via INTRAVENOUS
  Filled 2019-06-25 (×7): qty 2
  Filled 2019-06-25 (×7): qty 2000
  Filled 2019-06-25: qty 2
  Filled 2019-06-25: qty 2000
  Filled 2019-06-25 (×2): qty 2
  Filled 2019-06-25: qty 2000
  Filled 2019-06-25: qty 2
  Filled 2019-06-25 (×2): qty 2000
  Filled 2019-06-25: qty 2
  Filled 2019-06-25 (×2): qty 2000
  Filled 2019-06-25 (×2): qty 2
  Filled 2019-06-25: qty 2000
  Filled 2019-06-25: qty 2
  Filled 2019-06-25: qty 2000
  Filled 2019-06-25: qty 2
  Filled 2019-06-25: qty 2000

## 2019-06-25 MED ORDER — SALINE SPRAY 0.65 % NA SOLN
1.0000 | NASAL | Status: DC | PRN
  Filled 2019-06-25: qty 44

## 2019-06-25 MED ORDER — CALCIUM CARBONATE 1250 (500 CA) MG PO TABS
1.0000 | ORAL_TABLET | Freq: Three times a day (TID) | ORAL | Status: AC
  Administered 2019-06-25 (×2): 500 mg via ORAL
  Filled 2019-06-25 (×2): qty 1

## 2019-06-25 MED ORDER — SODIUM CHLORIDE 0.45 % IV SOLN: INTRAVENOUS | Status: DC

## 2019-06-25 MED ORDER — OXYCODONE HCL 5 MG PO TABS
5.0000 mg | ORAL_TABLET | Freq: Four times a day (QID) | ORAL | Status: DC | PRN
  Administered 2019-06-25 – 2019-07-03 (×9): 5 mg via ORAL
  Filled 2019-06-25 (×9): qty 1

## 2019-06-25 MED ORDER — GABAPENTIN 300 MG PO CAPS
300.0000 mg | ORAL_CAPSULE | Freq: Three times a day (TID) | ORAL | Status: DC
  Administered 2019-06-25 – 2019-06-28 (×10): 300 mg via ORAL
  Filled 2019-06-25 (×10): qty 1

## 2019-06-25 SURGICAL SUPPLY — 24 items

## 2019-06-25 NOTE — H&P (View-Only) (Signed)
[facility]       Reason for Consult: Enterococcal bacteremia    Referring Physician: [facility] autoconsult, Dr. Sloan Leiter  Principal Problem:   Severe sepsis [facility]) Active Problems:   Urinary retention   AKI (acute kidney injury) ([place])   Cholelithiasis   Diabetes mellitus, new onset ([place])   DM2 (diabetes mellitus, type 2) ([place])   CKD (chronic kidney disease) stage 3, GFR 30-59 ml/min   Prostate cancer (HCC)   GI bleed   Heme positive stool   Anemia due to chronic blood loss   . calcium carbonate  1 tablet Oral TID WC  . Chlorhexidine Gluconate Cloth  6 each Topical Daily  . insulin aspart  0-9 Units Subcutaneous Q6H  . mouth rinse  15 mL Mouth Rinse BID  . pantoprazole  40 mg Oral Q0600    Recommendations: Continue ampicillin Repeat blood cultures for clearance TTE   Assessment: He has Enterococcal in blood cultures in the setting of prostate cancer and hydronephrosis c/w a urinary source.  Though his urine culture shows multiple species, still c/w this as a source.  GI also a potential source with possible active bleeding.    Dr. Megan Salon on over the weekend if needed, otherwise we will follow up on Monday  Antibiotics: Ceftriaxone Ampicillin day 1  HPI: Jeffery [name] is a 59 y.o. male with incurable, metastatic prostate cancer on Zytiga who came in with fever, weakness, malaise and now with positive blood cultures in both sets as above. + anemia and concern for blood loss.  Has bilateral hydronephrosis and acute on chronic renal failure.  Enlarging prostate cancer on CT scan.  At this time is reluctant to have nephrostomy tubes.     Review of Systems:  Constitutional: positive for fevers and chills Integument/breast: negative for rash All other systems reviewed and are negative    Past Medical History:  Diagnosis Date  . Cancer ([place])    stage IV prostate cancer per patient  . Diabetes mellitus, new onset ([place]) 07/2016  .  Enlarged prostate   . HLD (hyperlipidemia)     Social History   Tobacco Use  . Smoking status: Former Smoker    Packs/day: 1.50    Years: 42.00    Pack years: 63.00    Types: Cigarettes    Quit date: 2015    Years since quitting: 6.1  . Smokeless tobacco: Never Used  . Tobacco comment: 2015  Substance Use Topics  . Alcohol use: No  . Drug use: No    Family History  Problem Relation Age of Onset  . Prostate cancer Father   . Heart disease Father   . Prostate cancer Paternal Grandfather     No Known Allergies  Physical Exam: Constitutional: in no apparent distress, tired appearing Vitals:   07/01/2019 0700 06/14/2019 0800  BP: 117/73   Pulse: (!) 125   Resp: (!) 22   Temp:  98.9 F (37.2 C)  SpO2: 98%    EYES: anicteric Cardiovascular: Cor RRR Respiratory: clear; GI: Bowel sounds are normal, liver is not enlarged, spleen is not enlarged Musculoskeletal: no pedal edema noted Skin: no rash Hematologic: no cervical lad  Lab Results  Component Value Date   WBC 9.7 07/04/2019   HGB 7.4 (L) 07/03/2019   HCT 26.4 (L) 07/10/2019   MCV 92.6 07/11/2019   PLT 249 06/28/2019    Lab Results  Component Value Date   CREATININE 3.82 (H) 06/17/2019  BUN 57 (H) 07/01/2019   NA 154 (H) 07/02/2019   K 4.2 07/02/2019   CL 119 (H) 06/17/2019   CO2 22 06/22/2019    Lab Results  Component Value Date   ALT 22 06/19/2019   AST 29 07/09/2019   ALKPHOS 158 (H) 06/23/2019     Microbiology: Recent Results (from the past 240 hour(s))  Urine culture     Status: Abnormal   Collection Time: 06/17/2019  3:37 PM   Specimen: Urine, Random  Result Value Ref Range Status   Specimen Description   Final    URINE, RANDOM Performed at [facility], [place] [address] [place], [place] [address]    Special Requests   Final    NONE Performed at [facility], [place] [address], [place], [place] [address]    Culture MULTIPLE SPECIES PRESENT, SUGGEST  RECOLLECTION (A)  Final   Report Status 06/24/2019 FINAL  Final  Culture, blood (routine x 2)     Status: Abnormal (Preliminary result)   Collection Time: 07/04/2019  4:56 PM   Specimen: BLOOD  Result Value Ref Range Status   Specimen Description   Final    BLOOD RIGHT ANTECUBITAL Performed at [facility] [address] [place], [place] [address]    Special Requests   Final    BOTTLES DRAWN AEROBIC ONLY Blood Culture results may not be optimal due to an excessive volume of blood received in culture bottles Performed at [facility] [address] [place], [place] [address]    Culture  Setup Time   Final    AEROBIC BOTTLE ONLY GRAM POSITIVE COCCI IN CHAINS CRITICAL RESULT CALLED TO, READ BACK BY AND VERIFIED WITH: Lavell Luster [facility] 07/09/2019 0032 JDW    Culture (A)  Final    ENTEROCOCCUS FAECALIS SUSCEPTIBILITIES TO FOLLOW Performed at [facility], [place] [address] [place], [place] [address]    Report Status PENDING  Incomplete  Blood Culture ID Panel (Reflexed)     Status: Abnormal   Collection Time: 07/12/2019  4:56 PM  Result Value Ref Range Status   Enterococcus species DETECTED (A) NOT DETECTED Final    Comment: CRITICAL RESULT CALLED TO, READ BACK BY AND VERIFIED WITH: J. GRIMSLEY PHARMD 06/22/2019 0032 JDW    Vancomycin resistance NOT DETECTED NOT DETECTED Final   Listeria monocytogenes NOT DETECTED NOT DETECTED Final   Staphylococcus species NOT DETECTED NOT DETECTED Final   Staphylococcus aureus (BCID) NOT DETECTED NOT DETECTED Final   Streptococcus species NOT DETECTED NOT DETECTED Final   Streptococcus agalactiae NOT DETECTED NOT DETECTED Final   Streptococcus pneumoniae NOT DETECTED NOT DETECTED Final   Streptococcus pyogenes NOT DETECTED NOT DETECTED Final   Acinetobacter baumannii NOT DETECTED NOT DETECTED Final   Enterobacteriaceae species NOT DETECTED NOT DETECTED Final   Enterobacter cloacae complex NOT DETECTED NOT  DETECTED Final   Escherichia coli NOT DETECTED NOT DETECTED Final   Klebsiella oxytoca NOT DETECTED NOT DETECTED Final   Klebsiella pneumoniae NOT DETECTED NOT DETECTED Final   Proteus species NOT DETECTED NOT DETECTED Final   Serratia marcescens NOT DETECTED NOT DETECTED Final   Haemophilus influenzae NOT DETECTED NOT DETECTED Final   Neisseria meningitidis NOT DETECTED NOT DETECTED Final   Pseudomonas aeruginosa NOT DETECTED NOT DETECTED Final   Candida albicans NOT DETECTED NOT DETECTED Final   Candida glabrata NOT DETECTED NOT DETECTED Final   Candida krusei NOT DETECTED NOT DETECTED Final   Candida parapsilosis  NOT DETECTED NOT DETECTED Final   Candida tropicalis NOT DETECTED NOT DETECTED Final    Comment: Performed at [facility], [place] [address], [place], [place] [address]  Culture, blood (routine x 2)     Status: Abnormal (Preliminary result)   Collection Time: 06/17/2019  5:16 PM   Specimen: BLOOD  Result Value Ref Range Status   Specimen Description   Final    BLOOD RIGHT ANTECUBITAL Performed at [place] [facility] [address], [place], [place] [address]    Special Requests   Final    BOTTLES DRAWN AEROBIC AND ANAEROBIC Blood Culture adequate volume Performed at [facility] [address], [place], [place] [address]    Culture  Setup Time   Final    GRAM POSITIVE COCCI IN CHAINS IN BOTH AEROBIC AND ANAEROBIC BOTTLES CRITICAL RESULT CALLED TO, READ BACK BY AND VERIFIED WITH: Minette Brine [id number] Performed at [facility], [place] [address], [place], [place] [address]    Culture ENTEROCOCCUS FAECALIS (A)  Final   Report Status PENDING  Incomplete  SARS CORONAVIRUS 2 (TAT 6-24 HRS) Nasopharyngeal Nasopharyngeal Swab     Status: None   Collection Time: 06/15/2019  5:20 PM   Specimen: Nasopharyngeal Swab  Result Value Ref Range Status   SARS Coronavirus 2 NEGATIVE NEGATIVE Final    Comment: (NOTE) SARS-CoV-2 target  nucleic acids are NOT DETECTED. The SARS-CoV-2 RNA is generally detectable in upper and lower respiratory specimens during the acute phase of infection. Negative results do not preclude SARS-CoV-2 infection, do not rule out co-infections with other pathogens, and should not be used as the sole basis for treatment or other patient management decisions. Negative results must be combined with clinical observations, patient history, and epidemiological information. The expected result is Negative. Fact Sheet for Patients: SugarRoll.be Fact Sheet for Healthcare Providers: https://www.woods-mathews.com/ This test is not yet approved or cleared by the [place] FDA and  has been authorized for detection and/or diagnosis of SARS-CoV-2 by FDA under an Emergency Use Authorization (EUA). This EUA will remain  in effect (meaning this test can be used) for the duration of the COVID-19 declaration under Section 56 4(b)(1) of the Act, 21 U.S.C. section 360bbb-3(b)(1), unless the authorization is terminated or revoked sooner. Performed at [facility], [place] [address] [place], [place] [address]   MRSA PCR Screening     Status: None   Collection Time: 06/24/19  1:08 AM   Specimen: Nasal Mucosa; Nasopharyngeal  Result Value Ref Range Status   MRSA by PCR NEGATIVE NEGATIVE Final    Comment:        The GeneXpert MRSA Assay (FDA approved for NASAL specimens only), is one component of a comprehensive MRSA colonization surveillance program. It is not intended to diagnose MRSA infection nor to guide or monitor treatment for MRSA infections. Performed at [facility], [place] [address] [place], [place] [address]     Marcelino Campos W Edinson Domeier, MD [facility] [email or web address] 06/15/2019, 12:11 PM

## 2019-06-25 NOTE — Progress Notes (Signed)
   Patient Name: Jeffery [name] Date of Encounter: 07/05/2019, 4:49 PM    Subjective  Tachycardic, tachypneic and febrile   Objective  BP 138/77   Pulse (!) 140   Temp (!) 101.4 F (38.6 C) (Oral)   Resp (!) 26   Ht 5\' 9"  (1.753 m)   Wt 98.5 kg   SpO2 93%   BMI 32.07 kg/m      Assessment and Plan  59 year old male with type 2 diabetes, stage IV prostate cancer with worsening anemia  Hemoglobin improved s/p PRBC transfusion  SIRS with sepsis, Enterococcus faecalis bacteremia  Patient has persistent tachycardia to 140s and remains febrile  We will hold off egd and colonoscopy for evaluation of worsening anemia until hemodynamic status improves  Continue IV antibiotics and supportive care   Damaris Hippo , MD [phone]

## 2019-06-25 NOTE — Progress Notes (Signed)
PROGRESS NOTE    Jeffery [name]  YKD:[id number] DOB: 1961/03/23 DOA: 07/12/2019 PCP: [name], MD    Brief Narrative:  Patient is a 59 year old gentleman with metastatic prostate cancer, diabetes, hyperlipidemia, chronic kidney disease stage IV with baseline creatinine about 2 who presented to the emergency room with generalized weakness, hypoxia and tachycardia, painful urination.  Patient also has chronic anemia and chronic guaiac positive stools.  He was working with therapies at home, he was found shaky and tremulous, oncology office was called who directed him to the ER.  In the emergency room, temperature 100.6, blood pressure stable.  Heart rate 140.  95% on room air.  WC count 11.8.  Hemoglobin 6.4.  Platelets were adequate.  BUN/creatinine was 59/3.54.  Patient was started on resuscitation, blood transfusions and admitted. A CT scan shows invasive prostate cancer into the bladder with severe bilateral hydronephrosis.  Assessment & Plan:   Principal Problem:   Severe sepsis ([place]) Active Problems:   Urinary retention   AKI (acute kidney injury) ([place])   Cholelithiasis   Diabetes mellitus, new onset ([place])   DM2 (diabetes mellitus, type 2) ([facility])   CKD (chronic kidney disease) stage 3, GFR 30-59 ml/min   Prostate cancer ([facility])   GI bleed   Heme positive stool   Anemia due to chronic blood loss  Sepsis, present on admission suspect from urinary source, Enterococcus bacteremia. Blood pressures stabilized after resuscitation.  Continue maintenance IV fluids, changed to half-normal saline due to hypernatremia. Intake and output monitoring.  Foley catheter for relief of obstruction. Urine cultures pending. Growing Enterococcus, on ampicillin, continue until final cultures. Bilateral hydronephrosis, will need relief of pressure.  Patient did not agree for nephrostomy, followed by urology.  Acute kidney injury on chronic kidney disease stage IV: Outpatient available  renal functions with creatinine about 2.  Patient also has significant obstruction and now with sepsis. Foley catheter, intake output monitoring.  Continue maintenance fluid. Discussed with, Dr. Justin Mend, patient's nephrology who recommended to continue resuscitation and monitoring.  Anemia of chronic disease, positive FOBT: Probably acute on chronic anemia.  Received 2 units of PRBC.  Hemoglobin more this is stable after that.  Mostly chronic anemia, will transfuse for less than 7.  Undergoing EGD and colonoscopy today.  Will keep on Protonix twice a day.   Hypernatremia: Due to isotonic fluid resuscitation.  Changed to half-normal saline and monitor levels.  Type 2 diabetes: Controlled.  On Metformin at home.  Discontinue all oral hypoglycemics.  Will keep on sliding scale insulin.  Metastatic prostate cancer, bladder obstruction, debility and chronic anemia: Followed by his oncologist.  Followed by his oncologist.  Does not look like he is ready for any chemotherapy now, will have outpatient follow-up after stabilization.  DVT prophylaxis: DVT prophylaxis with SCDs Code Status: Full code, patient on outpatient palliative therapy follow-up as per reports.  Will be seen by palliative medicine.  Family Communication: I have called and updated his mom on 3/11, I will update his family after procedure. Disposition Plan: patient is from home.. Anticipated DC to home, Barriers to discharge active treatment, antibiotics, inpatient procedures.   Consultants:   Urology  GI  Oncology  Palliative medicine  Procedures:   None  Antimicrobials:  Antibiotics Given (last 72 hours)    Date/Time Action Medication Dose Rate   06/17/2019 1837 New Bag/Given   cefTRIAXone (ROCEPHIN) 1 g in sodium chloride 0.9 % 100 mL IVPB 1 g 200 mL/hr   06/24/19 0834 New  Bag/Given   cefTRIAXone (ROCEPHIN) 2 g in sodium chloride 0.9 % 100 mL IVPB 2 g 200 mL/hr   07/02/2019 0125 New Bag/Given   ampicillin (OMNIPEN) 2  g in sodium chloride 0.9 % 100 mL IVPB 2 g 300 mL/hr   06/24/2019 0504 New Bag/Given   ampicillin (OMNIPEN) 2 g in sodium chloride 0.9 % 100 mL IVPB 2 g 300 mL/hr         Subjective: Patient seen and examined.  He was somehow more awake today.  He did have periods of confusion last night and he agrees with it. Has some suprapubic pain and discomfort.  Denies any nausea vomiting. He took some bowel prep, changing position while cleaning was not comfortable to him.  Objective: Vitals:   06/30/2019 0500 07/14/2019 0600 06/21/2019 0700 06/29/2019 0800  BP:  130/75 117/73   Pulse: (!) 126 (!) 122 (!) 125   Resp: (!) 23 20 (!) 22   Temp:    98.9 F (37.2 C)  TempSrc:    Axillary  SpO2: 98% (!) 89% 98%   Weight:      Height:        Intake/Output Summary (Last 24 hours) at 06/17/2019 1114 Last data filed at 07/02/2019 1101 Gross per 24 hour  Intake 5888.59 ml  Output 5950 ml  Net -61.41 ml   Filed Weights   06/24/19 0051  Weight: 98.5 kg    Examination:  General exam: Sick looking, currently comfortable, mucous membranes are moist.  Alert and awake but he looks debilitated and tired. Respiratory system: Clear to auscultation. Respiratory effort normal.  No added sounds. Cardiovascular system: S1 & S2 heard, tachycardic.  RRR. No JVD, No pedal edema. Gastrointestinal system: Abdomen is nondistended, soft .does have some palpable tenderness on the suprapubic area.  Bowel sounds present.   Central nervous system: Alert and awake, sick looking and tired.  Able to keep up with conversation.   Extremities: Symmetric 5 x 5 power.  Generalized weakness. Skin: No rashes, lesions or ulcers Psychiatry: Judgement and insight appear normal. Mood & affect appropriate.  Foley catheter with pink urine.   Data Reviewed: I have personally reviewed following labs and imaging studies  CBC: Recent Labs  Lab 06/16/2019 1714 06/24/19 0112 06/24/19 0858 06/20/2019 0216  WBC 11.8* 9.6 10.2 9.7    NEUTROABS 9.3*  --   --   --   HGB 6.4* 5.7* 7.5* 7.4*  HCT 22.6* 20.2* 26.0* 26.4*  MCV 88.6 89.0 89.3 92.6  PLT 271 372 336 008   Basic Metabolic Panel: Recent Labs  Lab 06/15/2019 1714 06/24/19 0858 07/11/2019 0216  NA 139 146* 154*  K 4.6 4.4 4.2  CL 100 108 119*  CO2 27 24 22   GLUCOSE 182* 141* 113*  BUN 59* 61* 57*  CREATININE 3.54* 3.79* 3.82*  CALCIUM 8.7* 8.2* 8.6*  MG  --   --  2.1  PHOS  --   --  5.7*   GFR: Estimated Creatinine Clearance: 25 mL/min (A) (by C-G formula based on SCr of 3.82 mg/dL (H)). Liver Function Tests: Recent Labs  Lab 06/24/2019 1714 06/24/19 0858 06/25/19 0216  AST 33 28 29  ALT 28 23 22   ALKPHOS 224* 186* 158*  BILITOT 0.8 1.0 0.5  PROT 7.3 6.5 6.1*  ALBUMIN 2.2* 2.0* 2.0*   No results for input(s): LIPASE, AMYLASE in the last 168 hours. Recent Labs  Lab 07/03/2019 1714  AMMONIA 14   Coagulation Profile: No results for input(s):  INR, PROTIME in the last 168 hours. Cardiac Enzymes: No results for input(s): CKTOTAL, CKMB, CKMBINDEX, TROPONINI in the last 168 hours. BNP (last 3 results) No results for input(s): PROBNP in the last 8760 hours. HbA1C: Recent Labs    06/24/19 0854  HGBA1C 6.6*   CBG: No results for input(s): GLUCAP in the last 168 hours. Lipid Profile: No results for input(s): CHOL, HDL, LDLCALC, TRIG, CHOLHDL, LDLDIRECT in the last 72 hours. Thyroid Function Tests: No results for input(s): TSH, T4TOTAL, FREET4, T3FREE, THYROIDAB in the last 72 hours. Anemia Panel: No results for input(s): VITAMINB12, FOLATE, FERRITIN, TIBC, IRON, RETICCTPCT in the last 72 hours. Sepsis Labs: Recent Labs  Lab 06/22/2019 1714 07/07/2019 1825  LATICACIDVEN 1.0 0.8    Recent Results (from the past 240 hour(s))  Urine culture     Status: Abnormal   Collection Time: 06/17/2019  3:37 PM   Specimen: Urine, Random  Result Value Ref Range Status   Specimen Description   Final    URINE, RANDOM Performed at [facility] [address], [place], [place] [address]    Special Requests   Final    NONE Performed at [facility], [place] [address] [place], [place] [address]    Culture MULTIPLE SPECIES PRESENT, SUGGEST RECOLLECTION (A)  Final   Report Status 06/24/2019 FINAL  Final  Culture, blood (routine x 2)     Status: Abnormal (Preliminary result)   Collection Time: 07/01/2019  4:56 PM   Specimen: BLOOD  Result Value Ref Range Status   Specimen Description   Final    BLOOD RIGHT ANTECUBITAL Performed at [facility] [address], [place], [place] [address]    Special Requests   Final    BOTTLES DRAWN AEROBIC ONLY Blood Culture results may not be optimal due to an excessive volume of blood received in culture bottles Performed at [facility] [address] [place], [place] [address]    Culture  Setup Time   Final    AEROBIC BOTTLE ONLY GRAM POSITIVE COCCI IN CHAINS CRITICAL RESULT CALLED TO, READ BACK BY AND VERIFIED WITH: Lavell L[facility] 07/07/2019 0032 JDW    Culture (A)  Final    ENTEROCOCCUS FAECALIS SUSCEPTIBILITIES TO FOLLOW Performed at [facility], [place] [address] [place], [place] [address]    Report Status PENDING  Incomplete  Blood Culture ID Panel (Reflexed)     Status: Abnormal   Collection Time: 07/10/2019  4:56 PM  Result Value Ref Range Status   Enterococcus species DETECTED (A) NOT DETECTED Final    Comment: CRITICAL RESULT CALLED TO, READ BACK BY AND VERIFIED WITH: J. GRIMSLEY PHARMD 06/25/19 0032 JDW    Vancomycin resistance NOT DETECTED NOT DETECTED Final   Listeria monocytogenes NOT DETECTED NOT DETECTED Final   Staphylococcus species NOT DETECTED NOT DETECTED Final   Staphylococcus aureus (BCID) NOT DETECTED NOT DETECTED Final   Streptococcus species NOT DETECTED NOT DETECTED Final   Streptococcus agalactiae NOT DETECTED NOT DETECTED Final   Streptococcus pneumoniae NOT DETECTED NOT DETECTED Final    Streptococcus pyogenes NOT DETECTED NOT DETECTED Final   Acinetobacter baumannii NOT DETECTED NOT DETECTED Final   Enterobacteriaceae species NOT DETECTED NOT DETECTED Final   Enterobacter cloacae complex NOT DETECTED NOT DETECTED Final   Escherichia coli NOT DETECTED NOT DETECTED Final   Klebsiella oxytoca NOT DETECTED NOT DETECTED Final   Klebsiella pneumoniae NOT DETECTED NOT DETECTED Final   Proteus species  NOT DETECTED NOT DETECTED Final   Serratia marcescens NOT DETECTED NOT DETECTED Final   Haemophilus influenzae NOT DETECTED NOT DETECTED Final   Neisseria meningitidis NOT DETECTED NOT DETECTED Final   Pseudomonas aeruginosa NOT DETECTED NOT DETECTED Final   Candida albicans NOT DETECTED NOT DETECTED Final   Candida glabrata NOT DETECTED NOT DETECTED Final   Candida krusei NOT DETECTED NOT DETECTED Final   Candida parapsilosis NOT DETECTED NOT DETECTED Final   Candida tropicalis NOT DETECTED NOT DETECTED Final    Comment: Performed at [facility], [place] [address] [place], [place] [address]  Culture, blood (routine x 2)     Status: Abnormal (Preliminary result)   Collection Time: 06/25/2019  5:16 PM   Specimen: BLOOD  Result Value Ref Range Status   Specimen Description   Final    BLOOD RIGHT ANTECUBITAL Performed at [facility] [address], [place], [place] [address]    Special Requests   Final    BOTTLES DRAWN AEROBIC AND ANAEROBIC Blood Culture adequate volume Performed at [facility] [address] [place], [place] [address]    Culture  Setup Time   Final    GRAM POSITIVE COCCI IN CHAINS IN BOTH AEROBIC AND ANAEROBIC BOTTLES CRITICAL RESULT CALLED TO, READ BACK BY AND VERIFIED WITH: Minette Brine [id number] Performed at [facility], [place] [address], [place], [place] [address]    Culture ENTEROCOCCUS FAECALIS (A)  Final   Report Status PENDING  Incomplete  SARS CORONAVIRUS 2 (TAT 6-24 HRS) Nasopharyngeal  Nasopharyngeal Swab     Status: None   Collection Time: 06/20/2019  5:20 PM   Specimen: Nasopharyngeal Swab  Result Value Ref Range Status   SARS Coronavirus 2 NEGATIVE NEGATIVE Final    Comment: (NOTE) SARS-CoV-2 target nucleic acids are NOT DETECTED. The SARS-CoV-2 RNA is generally detectable in upper and lower respiratory specimens during the acute phase of infection. Negative results do not preclude SARS-CoV-2 infection, do not rule out co-infections with other pathogens, and should not be used as the sole basis for treatment or other patient management decisions. Negative results must be combined with clinical observations, patient history, and epidemiological information. The expected result is Negative. Fact Sheet for Patients: SugarRoll.be Fact Sheet for Healthcare Providers: https://www.woods-mathews.com/ This test is not yet approved or cleared by the [place] FDA and  has been authorized for detection and/or diagnosis of SARS-CoV-2 by FDA under an Emergency Use Authorization (EUA). This EUA will remain  in effect (meaning this test can be used) for the duration of the COVID-19 declaration under Section 56 4(b)(1) of the Act, 21 U.S.C. section 360bbb-3(b)(1), unless the authorization is terminated or revoked sooner. Performed at [facility], [place] [address], [place], [place] [address]   MRSA PCR Screening     Status: None   Collection Time: 06/24/19  1:08 AM   Specimen: Nasal Mucosa; Nasopharyngeal  Result Value Ref Range Status   MRSA by PCR NEGATIVE NEGATIVE Final    Comment:        The GeneXpert MRSA Assay (FDA approved for NASAL specimens only), is one component of a comprehensive MRSA colonization surveillance program. It is not intended to diagnose MRSA infection nor to guide or monitor treatment for MRSA infections. Performed at [facility], [place] [address], [place], [place] [address]           Radiology Studies: DG Chest 2 View  Result Date:  06/25/2019 CLINICAL DATA:  Confusion. Decreased oxygen saturation. Weakness. History of prostate cancer. EXAM: CHEST - 2 VIEW COMPARISON:  Most recent radiograph 06/10/2019 FINDINGS: Upper normal heart size with unchanged mediastinal contours. Decreased right pleural effusion from prior exam. No definite acute airspace disease, blastic osseous lesions partially obscure evaluation of the underlying pulmonary parenchyma. No pulmonary edema. No pneumothorax. Multifocal blastic osseous metastatic disease, as seen on prior exam. Prior left fifth and sixth rib fractures. IMPRESSION: 1. Decreased right pleural effusion from prior exam. No definite acute abnormality. 2. Multifocal blastic osseous metastatic disease. Electronically Signed   By: Keith Rake M.D.   On: 07/09/2019 16:45   CT Head Wo Contrast  Result Date: 07/09/2019 CLINICAL DATA:  Delirium.  History of prostate cancer and seizures. EXAM: CT HEAD WITHOUT CONTRAST TECHNIQUE: Contiguous axial images were obtained from the base of the skull through the vertex without intravenous contrast. COMPARISON:  MRI 05/20/2019 FINDINGS: Brain: The brain shows a normal appearance without evidence of malformation, atrophy, old or acute small or large vessel infarction, mass lesion, hemorrhage, hydrocephalus or extra-axial collection. Vascular: No hyperdense vessel. No evidence of atherosclerotic calcification. Skull: Sclerotic and mixed lytic changes of C1, the skull base and calvarium consistent with the patient's known osseous metastatic disease. Sinuses/Orbits: Sinuses are clear. Orbits appear normal. Mastoids are clear. Other: None significant IMPRESSION: Normal appearance of the brain itself. Known metastatic disease of the bone. Electronically Signed   By: Nelson Chimes M.D.   On: 06/27/2019 17:05   CT RENAL STONE STUDY  Result Date: 06/22/2019 CLINICAL DATA:  History of prostate carcinoma with  renal failure EXAM: CT ABDOMEN AND PELVIS WITHOUT CONTRAST TECHNIQUE: Multidetector CT imaging of the abdomen and pelvis was performed following the standard protocol without IV contrast. COMPARISON:  12/23/2018 FINDINGS: Lower chest: Lung bases demonstrate small left pleural effusion and consolidation improved when compared with the prior exam. No new focal infiltrate is seen. Decreased attenuation is noted in the cardiac blood pool suggestive of anemia. Hepatobiliary: Liver is within normal limits. The gallbladder is well distended with a single dependent gallstone in the gallbladder neck. No pericholecystic fluid is seen. Pancreas: Unremarkable. No pancreatic ductal dilatation or surrounding inflammatory changes. Spleen: Normal in size without focal abnormality. Adrenals/Urinary Tract: Adrenal glands are within normal limits bilaterally. Severe hydronephrosis is noted bilaterally. This extends to the level of the urinary bladder. The bladder is partially distended. Considerable in growth of the known prostate carcinoma is noted inferiorly contributing to the hydronephrosis. Bladder wall thickening is noted likely related to a degree of bladder outlet obstruction. Stomach/Bowel: The appendix is within normal limits. The colon shows no obstructive or inflammatory changes. No small bowel or gastric abnormality is noted. Vascular/Lymphatic: Atherosclerotic calcifications of the aorta and iliac vessels are seen. No sizable lymphadenopathy is noted. Reproductive: Prostate again demonstrates in growth of tumor into the inferior aspect of bladder. This appears increased when compared with the prior exam measuring approximately 6.3 x 4.4 cm. Other: No abdominal wall hernia or abnormality. No abdominopelvic ascites. Musculoskeletal: Sclerotic bony metastatic disease is noted IMPRESSION: Enlarging prostate neoplasm with further ingrowth into the inferior aspect of the bladder with resulting severe hydronephrosis  bilaterally. The degree of hydronephrosis is relatively stable when compared with the prior exam. Cholelithiasis. Right lower lobe consolidation with right-sided effusion improved from the prior exam. No other focal abnormality is noted. Electronically Signed   By: Inez Catalina M.D.   On: 07/05/2019 21:07  Scheduled Meds: . calcium carbonate  1 tablet Oral TID WC  . Chlorhexidine Gluconate Cloth  6 each Topical Daily  . insulin aspart  0-9 Units Subcutaneous Q6H  . mouth rinse  15 mL Mouth Rinse BID  . pantoprazole  40 mg Oral Q0600   Continuous Infusions: . sodium chloride 75 mL/hr at 07/02/2019 0521  . sodium chloride Stopped (06/21/2019 1846)  . ampicillin (OMNIPEN) IV       LOS: 2 days    Time spent: 30 minutes    Barb Merino, MD [facility] Pager [phone]

## 2019-06-25 NOTE — Progress Notes (Signed)
PHARMACY - PHYSICIAN COMMUNICATION CRITICAL VALUE ALERT - BLOOD CULTURE IDENTIFICATION (BCID)  Jeffery [name] is an 59 y.o. male who presented to [facility] on 06/29/2019 with a chief complaint of weakness.  Assessment:  Patient with + BCID noted below (include suspected source if known)  Name of physician (or Provider) Contacted: M. Sharlet Salina  Current antibiotics: Ceftriaxone   Changes to prescribed antibiotics recommended:  Change to Ampicillin 2gm iv q6hr (adjusted for renal function)  Results for orders placed or performed during the hospital encounter of 06/15/2019  Blood Culture ID Panel (Reflexed) (Collected: 07/09/2019  4:56 PM)  Result Value Ref Range   Enterococcus species DETECTED (A) NOT DETECTED   Vancomycin resistance NOT DETECTED NOT DETECTED   Listeria monocytogenes NOT DETECTED NOT DETECTED   Staphylococcus species NOT DETECTED NOT DETECTED   Staphylococcus aureus (BCID) NOT DETECTED NOT DETECTED   Streptococcus species NOT DETECTED NOT DETECTED   Streptococcus agalactiae NOT DETECTED NOT DETECTED   Streptococcus pneumoniae NOT DETECTED NOT DETECTED   Streptococcus pyogenes NOT DETECTED NOT DETECTED   Acinetobacter baumannii NOT DETECTED NOT DETECTED   Enterobacteriaceae species NOT DETECTED NOT DETECTED   Enterobacter cloacae complex NOT DETECTED NOT DETECTED   Escherichia coli NOT DETECTED NOT DETECTED   Klebsiella oxytoca NOT DETECTED NOT DETECTED   Klebsiella pneumoniae NOT DETECTED NOT DETECTED   Proteus species NOT DETECTED NOT DETECTED   Serratia marcescens NOT DETECTED NOT DETECTED   Haemophilus influenzae NOT DETECTED NOT DETECTED   Neisseria meningitidis NOT DETECTED NOT DETECTED   Pseudomonas aeruginosa NOT DETECTED NOT DETECTED   Candida albicans NOT DETECTED NOT DETECTED   Candida glabrata NOT DETECTED NOT DETECTED   Candida krusei NOT DETECTED NOT DETECTED   Candida parapsilosis NOT DETECTED NOT DETECTED   Candida tropicalis NOT DETECTED NOT  DETECTED    [name] 06/23/2019  4:45 AM

## 2019-06-25 NOTE — Consult Note (Signed)
[facility] for Infectious Disease       Reason for Consult: Enterococcal bacteremia    Referring Physician: CH[facility] autoconsult, Dr. Sloan Leiter  Principal Problem:   Severe sepsis [facility]) Active Problems:   Urinary retention   AKI (acute kidney injury) ([place])   Cholelithiasis   Diabetes mellitus, new onset ([place])   DM2 (diabetes mellitus, type 2) ([place])   CKD (chronic kidney disease) stage 3, GFR 30-59 ml/min   Prostate cancer (HCC)   GI bleed   Heme positive stool   Anemia due to chronic blood loss   . calcium carbonate  1 tablet Oral TID WC  . Chlorhexidine Gluconate Cloth  6 each Topical Daily  . insulin aspart  0-9 Units Subcutaneous Q6H  . mouth rinse  15 mL Mouth Rinse BID  . pantoprazole  40 mg Oral Q0600    Recommendations: Continue ampicillin Repeat blood cultures for clearance TTE   Assessment: He has Enterococcal in blood cultures in the setting of prostate cancer and hydronephrosis c/w a urinary source.  Though his urine culture shows multiple species, still c/w this as a source.  GI also a potential source with possible active bleeding.    Dr. Megan Salon on over the weekend if needed, otherwise we will follow up on Monday  Antibiotics: Ceftriaxone Ampicillin day 1  HPI: Jeffery [name] is a 59 y.o. male with incurable, metastatic prostate cancer on Zytiga who came in with fever, weakness, malaise and now with positive blood cultures in both sets as above. + anemia and concern for blood loss.  Has bilateral hydronephrosis and acute on chronic renal failure.  Enlarging prostate cancer on CT scan.  At this time is reluctant to have nephrostomy tubes.     Review of Systems:  Constitutional: positive for fevers and chills Integument/breast: negative for rash All other systems reviewed and are negative    Past Medical History:  Diagnosis Date  . Cancer ([place])    stage IV prostate cancer per patient  . Diabetes mellitus, new onset ([place]) 07/2016  .  Enlarged prostate   . HLD (hyperlipidemia)     Social History   Tobacco Use  . Smoking status: Former Smoker    Packs/day: 1.50    Years: 42.00    Pack years: 63.00    Types: Cigarettes    Quit date: 2015    Years since quitting: 6.1  . Smokeless tobacco: Never Used  . Tobacco comment: 2015  Substance Use Topics  . Alcohol use: No  . Drug use: No    Family History  Problem Relation Age of Onset  . Prostate cancer Father   . Heart disease Father   . Prostate cancer Paternal Grandfather     No Known Allergies  Physical Exam: Constitutional: in no apparent distress, tired appearing Vitals:   07/11/2019 0700 07/02/2019 0800  BP: 117/73   Pulse: (!) 125   Resp: (!) 22   Temp:  98.9 F (37.2 C)  SpO2: 98%    EYES: anicteric Cardiovascular: Cor RRR Respiratory: clear; GI: Bowel sounds are normal, liver is not enlarged, spleen is not enlarged Musculoskeletal: no pedal edema noted Skin: no rash Hematologic: no cervical lad  Lab Results  Component Value Date   WBC 9.7 06/29/2019   HGB 7.4 (L) 07/02/2019   HCT 26.4 (L) 07/07/2019   MCV 92.6 06/20/2019   PLT 249 06/18/2019    Lab Results  Component Value Date   CREATININE 3.82 (H) 06/24/2019  BUN 57 (H) 07/10/2019   NA 154 (H) 07/07/2019   K 4.2 07/03/2019   CL 119 (H) 07/03/2019   CO2 22 07/01/2019    Lab Results  Component Value Date   ALT 22 06/28/2019   AST 29 07/13/2019   ALKPHOS 158 (H) 06/24/2019     Microbiology: Recent Results (from the past 240 hour(s))  Urine culture     Status: Abnormal   Collection Time: 06/16/2019  3:37 PM   Specimen: Urine, Random  Result Value Ref Range Status   Specimen Description   Final    URINE, RANDOM Performed at [facility], [place] [address] [place], [place] [address]    Special Requests   Final    NONE Performed at [facility], [place] [address], [place], [place] [address]    Culture MULTIPLE SPECIES PRESENT, SUGGEST  RECOLLECTION (A)  Final   Report Status 06/24/2019 FINAL  Final  Culture, blood (routine x 2)     Status: Abnormal (Preliminary result)   Collection Time: 06/22/2019  4:56 PM   Specimen: BLOOD  Result Value Ref Range Status   Specimen Description   Final    BLOOD RIGHT ANTECUBITAL Performed at [place] [address] [place], [place] [address]    Special Requests   Final    BOTTLES DRAWN AEROBIC ONLY Blood Culture results may not be optimal due to an excessive volume of blood received in culture bottles Performed at [place] [address] [place], [place] [address]    Culture  Setup Time   Final    AEROBIC BOTTLE ONLY GRAM POSITIVE COCCI IN CHAINS CRITICAL RESULT CALLED TO, READ BACK BY AND VERIFIED WITH: Lavell Luster [facility] 07/14/2019 0032 JDW    Culture (A)  Final    ENTEROCOCCUS FAECALIS SUSCEPTIBILITIES TO FOLLOW Performed at [facility], [place] [address], [place],  [address]    Report Status PENDING  Incomplete  Blood Culture ID Panel (Reflexed)     Status: Abnormal   Collection Time: 07/13/2019  4:56 PM  Result Value Ref Range Status   Enterococcus species DETECTED (A) NOT DETECTED Final    Comment: CRITICAL RESULT CALLED TO, READ BACK BY AND VERIFIED WITH: J. GRIMSLEY PHARMD 06/16/2019 0032 JDW    Vancomycin resistance NOT DETECTED NOT DETECTED Final   Listeria monocytogenes NOT DETECTED NOT DETECTED Final   Staphylococcus species NOT DETECTED NOT DETECTED Final   Staphylococcus aureus (BCID) NOT DETECTED NOT DETECTED Final   Streptococcus species NOT DETECTED NOT DETECTED Final   Streptococcus agalactiae NOT DETECTED NOT DETECTED Final   Streptococcus pneumoniae NOT DETECTED NOT DETECTED Final   Streptococcus pyogenes NOT DETECTED NOT DETECTED Final   Acinetobacter baumannii NOT DETECTED NOT DETECTED Final   Enterobacteriaceae species NOT DETECTED NOT DETECTED Final   Enterobacter cloacae complex NOT DETECTED NOT  DETECTED Final   Escherichia coli NOT DETECTED NOT DETECTED Final   Klebsiella oxytoca NOT DETECTED NOT DETECTED Final   Klebsiella pneumoniae NOT DETECTED NOT DETECTED Final   Proteus species NOT DETECTED NOT DETECTED Final   Serratia marcescens NOT DETECTED NOT DETECTED Final   Haemophilus influenzae NOT DETECTED NOT DETECTED Final   Neisseria meningitidis NOT DETECTED NOT DETECTED Final   Pseudomonas aeruginosa NOT DETECTED NOT DETECTED Final   Candida albicans NOT DETECTED NOT DETECTED Final   Candida glabrata NOT DETECTED NOT DETECTED Final   Candida krusei NOT DETECTED NOT DETECTED Final   Candida parapsilosis  NOT DETECTED NOT DETECTED Final   Candida tropicalis NOT DETECTED NOT DETECTED Final    Comment: Performed at [facility], [place] [address], [place], [place] [address]  Culture, blood (routine x 2)     Status: Abnormal (Preliminary result)   Collection Time: 07/06/2019  5:16 PM   Specimen: BLOOD  Result Value Ref Range Status   Specimen Description   Final    BLOOD RIGHT ANTECUBITAL Performed at [facility] [address] [place], [place] [address]    Special Requests   Final    BOTTLES DRAWN AEROBIC AND ANAEROBIC Blood Culture adequate volume Performed at [place] [address] [place], [place] [address]    Culture  Setup Time   Final    GRAM POSITIVE COCCI IN CHAINS IN BOTH AEROBIC AND ANAEROBIC BOTTLES CRITICAL RESULT CALLED TO, READ BACK BY AND VERIFIED WITH: Minette Brine [id number] MLM Performed at [facility], [place] [address] [place], [place] [address]    Culture ENTEROCOCCUS FAECALIS (A)  Final   Report Status PENDING  Incomplete  SARS CORONAVIRUS 2 (TAT 6-24 HRS) Nasopharyngeal Nasopharyngeal Swab     Status: None   Collection Time: 06/14/2019  5:20 PM   Specimen: Nasopharyngeal Swab  Result Value Ref Range Status   SARS Coronavirus 2 NEGATIVE NEGATIVE Final    Comment: (NOTE) SARS-CoV-2 target  nucleic acids are NOT DETECTED. The SARS-CoV-2 RNA is generally detectable in upper and lower respiratory specimens during the acute phase of infection. Negative results do not preclude SARS-CoV-2 infection, do not rule out co-infections with other pathogens, and should not be used as the sole basis for treatment or other patient management decisions. Negative results must be combined with clinical observations, patient history, and epidemiological information. The expected result is Negative. Fact Sheet for Patients: SugarRoll.be Fact Sheet for Healthcare Providers: https://www.woods-mathews.com/ This test is not yet approved or cleared by the [place] FDA and  has been authorized for detection and/or diagnosis of SARS-CoV-2 by FDA under an Emergency Use Authorization (EUA). This EUA will remain  in effect (meaning this test can be used) for the duration of the COVID-19 declaration under Section 56 4(b)(1) of the Act, 21 U.S.C. section 360bbb-3(b)(1), unless the authorization is terminated or revoked sooner. Performed at [facility], [place] [address], [place], [place] [address]   MRSA PCR Screening     Status: None   Collection Time: 06/24/19  1:08 AM   Specimen: Nasal Mucosa; Nasopharyngeal  Result Value Ref Range Status   MRSA by PCR NEGATIVE NEGATIVE Final    Comment:        The GeneXpert MRSA Assay (FDA approved for NASAL specimens only), is one component of a comprehensive MRSA colonization surveillance program. It is not intended to diagnose MRSA infection nor to guide or monitor treatment for MRSA infections. Performed at [facility], [place] [address] [place], [place] [address]     Charlies Rayburn W Jaeleigh Monaco, MD [facility] for Infectious Disease [facility] [email or web address] 07/11/2019, 12:11 PM

## 2019-06-25 NOTE — Progress Notes (Signed)
Palliative Medicine Team consult was received.   Chart reviewed.  Patient off the floor for procedure.  Will attempt f/u tomorrow.  If there are urgent needs or questions please call [phone]. Thank you for consulting out team to assist with this patients care.  Micheline Rough, MD [facility]iative Medicine Team [phone]  NO CHARGE NOTE

## 2019-06-25 NOTE — Progress Notes (Signed)
  Echocardiogram 2D Echocardiogram has been performed.  Jeffery [name] 06/19/2019, 3:24 PM

## 2019-06-25 NOTE — Progress Notes (Signed)
Pt intermittently confused.  After cleaning pt up, pt stated "Now what are we going to do about these ants in the bed"   No ants were noted in the bed.  RN will continue to monitor

## 2019-06-25 NOTE — Progress Notes (Signed)
PHARMACY NOTE:  ANTIMICROBIAL RENAL DOSAGE ADJUSTMENT  Current antimicrobial regimen includes a mismatch between antimicrobial dosage and estimated renal function.  As per policy approved by the Pharmacy & Therapeutics and Medical Executive Committees, the antimicrobial dosage will be adjusted accordingly.  Current antimicrobial dosage:  Ampicillin 2gm iv q6hr  Indication: Bacteremia  Renal Function:  Estimated Creatinine Clearance: 25 mL/min (A) (by C-G formula based on SCr of 3.82 mg/dL (H)). []      On intermittent HD, scheduled: []      On CRRT    Antimicrobial dosage has been changed to:  Ampicillin 2gm iv q8hr  Additional comments:   Thank you for allowing pharmacy to be a part of this patient's care.  [place], [place], [facility] 07/10/2019 6:12 AM

## 2019-06-26 DIAGNOSIS — Z515 Encounter for palliative care: Secondary | ICD-10-CM

## 2019-06-26 DIAGNOSIS — Z7189 Other specified counseling: Secondary | ICD-10-CM

## 2019-06-26 LAB — COMPREHENSIVE METABOLIC PANEL
ALT: 26 U/L (ref 0–44)
AST: 42 U/L — ABNORMAL HIGH (ref 15–41)
Albumin: 1.6 g/dL — ABNORMAL LOW (ref 3.5–5.0)
Alkaline Phosphatase: 144 U/L — ABNORMAL HIGH (ref 38–126)
Anion gap: 11 (ref 5–15)
BUN: 44 mg/dL — ABNORMAL HIGH (ref 6–20)
CO2: 21 mmol/L — ABNORMAL LOW (ref 22–32)
Calcium: 9 mg/dL (ref 8.9–10.3)
Chloride: 113 mmol/L — ABNORMAL HIGH (ref 98–111)
Creatinine, Ser: 3.32 mg/dL — ABNORMAL HIGH (ref 0.61–1.24)
GFR calc Af Amer: 23 mL/min — ABNORMAL LOW (ref >60)
GFR calc non Af Amer: 20 mL/min — ABNORMAL LOW (ref >60)
Glucose, Bld: 151 mg/dL — ABNORMAL HIGH (ref 70–99)
Potassium: 3.2 mmol/L — ABNORMAL LOW (ref 3.5–5.1)
Sodium: 145 mmol/L (ref 135–145)
Total Bilirubin: 0.5 mg/dL (ref 0.3–1.2)
Total Protein: 6 g/dL — ABNORMAL LOW (ref 6.5–8.1)

## 2019-06-26 LAB — CBC WITH DIFFERENTIAL/PLATELET
Abs Immature Granulocytes: 0.17 10*3/uL — ABNORMAL HIGH (ref 0.00–0.07)
Basophils Absolute: 0 10*3/uL (ref 0.0–0.1)
Basophils Relative: 0 %
Eosinophils Absolute: 0.1 10*3/uL (ref 0.0–0.5)
Eosinophils Relative: 1 %
HCT: 25 % — ABNORMAL LOW (ref 39.0–52.0)
Hemoglobin: 7 g/dL — ABNORMAL LOW (ref 13.0–17.0)
Immature Granulocytes: 2 %
Lymphocytes Relative: 14 %
Lymphs Abs: 1.3 10*3/uL (ref 0.7–4.0)
MCH: 25.4 pg — ABNORMAL LOW (ref 26.0–34.0)
MCHC: 28 g/dL — ABNORMAL LOW (ref 30.0–36.0)
MCV: 90.6 fL (ref 80.0–100.0)
Monocytes Absolute: 0.5 10*3/uL (ref 0.1–1.0)
Monocytes Relative: 5 %
Neutro Abs: 7.4 10*3/uL (ref 1.7–7.7)
Neutrophils Relative %: 78 %
Platelets: 228 10*3/uL (ref 150–400)
RBC: 2.76 MIL/uL — ABNORMAL LOW (ref 4.22–5.81)
RDW: 18.6 % — ABNORMAL HIGH (ref 11.5–15.5)
WBC: 9.4 10*3/uL (ref 4.0–10.5)
nRBC: 0 % (ref 0.0–0.2)

## 2019-06-26 LAB — GLUCOSE, CAPILLARY
Glucose-Capillary: 124 mg/dL — ABNORMAL HIGH (ref 70–99)
Glucose-Capillary: 133 mg/dL — ABNORMAL HIGH (ref 70–99)

## 2019-06-26 LAB — POTASSIUM: Potassium: 3.5 mmol/L (ref 3.5–5.1)

## 2019-06-26 LAB — CULTURE, BLOOD (ROUTINE X 2): Special Requests: ADEQUATE

## 2019-06-26 LAB — HEMOGLOBIN AND HEMATOCRIT, BLOOD
HCT: 25 % — ABNORMAL LOW (ref 39.0–52.0)
Hemoglobin: 7.2 g/dL — ABNORMAL LOW (ref 13.0–17.0)

## 2019-06-26 LAB — MAGNESIUM: Magnesium: 1.9 mg/dL (ref 1.7–2.4)

## 2019-06-26 MED ORDER — FENTANYL CITRATE (PF) 100 MCG/2ML IJ SOLN
25.0000 ug | INTRAMUSCULAR | Status: DC | PRN
  Administered 2019-07-04 – 2019-07-08 (×4): 25 ug via INTRAVENOUS
  Filled 2019-06-26 (×4): qty 2

## 2019-06-26 MED ORDER — LABETALOL HCL 5 MG/ML IV SOLN
10.0000 mg | Freq: Once | INTRAVENOUS | Status: AC
  Administered 2019-06-26: 10 mg via INTRAVENOUS
  Filled 2019-06-26: qty 4

## 2019-06-26 NOTE — Progress Notes (Signed)
Urology Inpatient Progress Report  Metabolic encephalopathy [J24.26] Prostate cancer ([place]) [C61] GI bleed [K92.2] Hypoxia [R09.02] Left leg weakness [R29.898] AKI (acute kidney injury) ([place]) [N17.9] Urinary tract infection without hematuria, site unspecified [N39.0] Anemia, unspecified type [D64.9]   Intv/Subj: Patient sleeping this morning on rounds.  Gently woke patient to talk about how he is feeling.  He did not want to talk about the possibility of nephrostomy tubes with me.    Principal Problem:   Severe sepsis ([place]) Active Problems:   Urinary retention   AKI (acute kidney injury) ([place])   Cholelithiasis   Diabetes mellitus, new onset ([place])   DM2 (diabetes mellitus, type 2) (H[facility])   CKD (chronic kidney disease) stage 3, GFR 30-59 ml/min   Prostate cancer ([facility])   GI bleed   Heme positive stool   Anemia due to chronic blood loss  Current Facility-Administered Medications  Medication Dose Route Frequency Provider Last Rate Last Admin  . 0.45 % sodium chloride infusion   Intravenous Continuous Lang Snow, FNP 75 mL/hr at 06/26/19 1150 New Bag at 06/26/19 1150  . 0.9 %  sodium chloride infusion  10 mL/hr Intravenous Once Fredia Sorrow, MD   Stopped at 07/04/2019 1846  . acetaminophen (TYLENOL) tablet 650 mg  650 mg Oral Q6H PRN Lang Snow, FNP   650 mg at 06/26/19 0932   Or  . acetaminophen (TYLENOL) suppository 650 mg  650 mg Rectal Q6H PRN Lang Snow, FNP      . ampicillin (OMNIPEN) 2 g in sodium chloride 0.9 % 100 mL IVPB  2 g Intravenous Q8H Barb Merino, MD   Stopped at 06/26/19 0536  . Chlorhexidine Gluconate Cloth 2 % PADS 6 each  6 each Topical Daily Elwyn Reach, MD   6 each at 06/26/19 1150  . fentaNYL (SUBLIMAZE) injection 25 mcg  25 mcg Intravenous Q2H PRN Micheline Rough, MD      . gabapentin (NEURONTIN) capsule 300 mg  300 mg Oral TID Barb Merino, MD   300 mg at 06/26/19 0932  . insulin aspart (novoLOG) injection 0-9 Units  0-9 Units  Subcutaneous Q6H Barb Merino, MD   1 Units at 06/26/19 1148  . MEDLINE mouth rinse  15 mL Mouth Rinse BID Elwyn Reach, MD   15 mL at 06/28/2019 2109  . ondansetron (ZOFRAN) tablet 4 mg  4 mg Oral Q6H PRN Elwyn Reach, MD       Or  . ondansetron (ZOFRAN) injection 4 mg  4 mg Intravenous Q6H PRN Gala Romney L, MD      . oxyCODONE (Oxy IR/ROXICODONE) immediate release tablet 5 mg  5 mg Oral Q6H PRN Barb Merino, MD   5 mg at 06/26/19 0932  . pantoprazole (PROTONIX) EC tablet 40 mg  40 mg Oral Q0600 Vena Rua, PA-C   40 mg at 06/26/19 0516  . sodium chloride (OCEAN) 0.65 % nasal spray 1 spray  1 spray Each Nare PRN Barb Merino, MD         Objective: Vital: Vitals:   06/26/19 0600 06/26/19 0800 06/26/19 0900 06/26/19 1200  BP: (!) 141/83 138/76 (!) 166/85   Pulse: (!) 123 (!) 125 (!) 125   Resp: (!) 22 (!) 23 (!) 22   Temp:  98.6 F (37 C)  98.9 F (37.2 C)  TempSrc:  Axillary  Axillary  SpO2: 96% 92% 92%   Weight:      Height:       I/Os: I/O last 3  completed shifts: In: 5670.9 [P.O.:2210; I.V.:3060.9; IV Piggyback:400] Out: 6950 [Urine:6950]  Physical Exam:  General: Patient is in no apparent distress Lungs: Normal respiratory effort, chest expands symmetrically. Foley: Draining clear yellow urine Ext: lower extremities symmetric  Lab Results: Recent Labs    06/24/19 0858 06/24/19 0858 06/19/2019 0216 06/26/19 0210 06/26/19 0621  WBC 10.2  --  9.7 9.4  --   HGB 7.5*   < > 7.4* 7.0* 7.2*  HCT 26.0*   < > 26.4* 25.0* 25.0*   < > = values in this interval not displayed.   Recent Labs    06/24/19 0858 06/24/19 0858 06/23/2019 0216 06/26/19 0210 06/26/19 0621  NA 146*  --  154* 145  --   K 4.4   < > 4.2 3.2* 3.5  CL 108  --  119* 113*  --   CO2 24  --  22 21*  --   GLUCOSE 141*  --  113* 151*  --   BUN 61*  --  57* 44*  --   CREATININE 3.79*  --  3.82* 3.32*  --   CALCIUM 8.2*  --  8.6* 9.0  --    < > = values in this interval not  displayed.   No results for input(s): LABPT, INR in the last 72 hours. No results for input(s): LABURIN in the last 72 hours. Results for orders placed or performed during the hospital encounter of 06/29/2019  Urine culture     Status: Abnormal   Collection Time: 07/09/2019  3:37 PM   Specimen: Urine, Random  Result Value Ref Range Status   Specimen Description   Final    URINE, RANDOM Performed at [facility] [address] [place], [place] [address]    Special Requests   Final    NONE Performed at [facility], [place] [address] [place], [place] [address]    Culture MULTIPLE SPECIES PRESENT, SUGGEST RECOLLECTION (A)  Final   Report Status 06/24/2019 FINAL  Final  Culture, blood (routine x 2)     Status: Abnormal   Collection Time: 06/22/2019  4:56 PM   Specimen: BLOOD  Result Value Ref Range Status   Specimen Description   Final    BLOOD RIGHT ANTECUBITAL Performed at [facility] [address] [place], [place] [address]    Special Requests   Final    BOTTLES DRAWN AEROBIC ONLY Blood Culture results may not be optimal due to an excessive volume of blood received in culture bottles Performed at [facility] [address], [place], [place] [address]    Culture  Setup Time   Final    AEROBIC BOTTLE ONLY GRAM POSITIVE COCCI IN CHAINS CRITICAL RESULT CALLED TO, READ BACK BY AND VERIFIED WITH: Lavell Luster [name] 07/09/2019 0032 JDW Performed at [facility], [place] [address], [place], [place] [address]    Culture ENTEROCOCCUS FAECALIS (A)  Final   Report Status 06/26/2019 FINAL  Final   Organism ID, Bacteria ENTEROCOCCUS FAECALIS  Final      Susceptibility   Enterococcus faecalis - MIC*    AMPICILLIN <=2 SENSITIVE Sensitive     VANCOMYCIN 1 SENSITIVE Sensitive     GENTAMICIN SYNERGY SENSITIVE Sensitive     * ENTEROCOCCUS FAECALIS  Blood Culture ID Panel (Reflexed)     Status: Abnormal   Collection  Time: 06/29/2019  4:56 PM  Result Value Ref Range Status   Enterococcus species DETECTED (A)  NOT DETECTED Final    Comment: CRITICAL RESULT CALLED TO, READ BACK BY AND VERIFIED WITH: J. GRIMSLEY PHARMD 06/26/2019 0032 JDW    Vancomycin resistance NOT DETECTED NOT DETECTED Final   Listeria monocytogenes NOT DETECTED NOT DETECTED Final   Staphylococcus species NOT DETECTED NOT DETECTED Final   Staphylococcus aureus (BCID) NOT DETECTED NOT DETECTED Final   Streptococcus species NOT DETECTED NOT DETECTED Final   Streptococcus agalactiae NOT DETECTED NOT DETECTED Final   Streptococcus pneumoniae NOT DETECTED NOT DETECTED Final   Streptococcus pyogenes NOT DETECTED NOT DETECTED Final   Acinetobacter baumannii NOT DETECTED NOT DETECTED Final   Enterobacteriaceae species NOT DETECTED NOT DETECTED Final   Enterobacter cloacae complex NOT DETECTED NOT DETECTED Final   Escherichia coli NOT DETECTED NOT DETECTED Final   Klebsiella oxytoca NOT DETECTED NOT DETECTED Final   Klebsiella pneumoniae NOT DETECTED NOT DETECTED Final   Proteus species NOT DETECTED NOT DETECTED Final   Serratia marcescens NOT DETECTED NOT DETECTED Final   Haemophilus influenzae NOT DETECTED NOT DETECTED Final   Neisseria meningitidis NOT DETECTED NOT DETECTED Final   Pseudomonas aeruginosa NOT DETECTED NOT DETECTED Final   Candida albicans NOT DETECTED NOT DETECTED Final   Candida glabrata NOT DETECTED NOT DETECTED Final   Candida krusei NOT DETECTED NOT DETECTED Final   Candida parapsilosis NOT DETECTED NOT DETECTED Final   Candida tropicalis NOT DETECTED NOT DETECTED Final    Comment: Performed at [facility], [place]. [address], [place], [place] [address]  Culture, blood (routine x 2)     Status: Abnormal   Collection Time: 07/12/2019  5:16 PM   Specimen: BLOOD  Result Value Ref Range Status   Specimen Description   Final    BLOOD RIGHT ANTECUBITAL Performed at [facility] [address],  [place], [place] [address]    Special Requests   Final    BOTTLES DRAWN AEROBIC AND ANAEROBIC Blood Culture adequate volume Performed at [facility] [address] [place], [place] [address]    Culture  Setup Time   Final    GRAM POSITIVE COCCI IN CHAINS IN BOTH AEROBIC AND ANAEROBIC BOTTLES CRITICAL RESULT CALLED TO, READ BACK BY AND VERIFIED WITH: PHARMD M RENZ [id number] MLM    Culture (A)  Final    ENTEROCOCCUS FAECALIS SUSCEPTIBILITIES PERFORMED ON PREVIOUS CULTURE WITHIN THE LAST 5 DAYS. Performed at [facility], [place] [address], [place], [place] [address]    Report Status 06/26/2019 FINAL  Final  SARS CORONAVIRUS 2 (TAT 6-24 HRS) Nasopharyngeal Nasopharyngeal Swab     Status: None   Collection Time: 06/25/2019  5:20 PM   Specimen: Nasopharyngeal Swab  Result Value Ref Range Status   SARS Coronavirus 2 NEGATIVE NEGATIVE Final    Comment: (NOTE) SARS-CoV-2 target nucleic acids are NOT DETECTED. The SARS-CoV-2 RNA is generally detectable in upper and lower respiratory specimens during the acute phase of infection. Negative results do not preclude SARS-CoV-2 infection, do not rule out co-infections with other pathogens, and should not be used as the sole basis for treatment or other patient management decisions. Negative results must be combined with clinical observations, patient history, and epidemiological information. The expected result is Negative. Fact Sheet for Patients: SugarRoll.be Fact Sheet for Healthcare Providers: https://www.woods-mathews.com/ This test is not yet approved or cleared by the [place] FDA and  has been authorized for detection and/or diagnosis of SARS-CoV-2 by FDA under an Emergency Use Authorization (EUA). This EUA  will remain  in effect (meaning this test can be used) for the duration of the COVID-19 declaration under Section 56 4(b)(1) of the Act, 21 U.S.C. section  360bbb-3(b)(1), unless the authorization is terminated or revoked sooner. Performed at [facility], [place] [address], [place], [place] [address]   MRSA PCR Screening     Status: None   Collection Time: 06/24/19  1:08 AM   Specimen: Nasal Mucosa; Nasopharyngeal  Result Value Ref Range Status   MRSA by PCR NEGATIVE NEGATIVE Final    Comment:        The GeneXpert MRSA Assay (FDA approved for NASAL specimens only), is one component of a comprehensive MRSA colonization surveillance program. It is not intended to diagnose MRSA infection nor to guide or monitor treatment for MRSA infections. Performed at [facility], [place] [address], [place], [place] [address]     Studies/Results: ECHOCARDIOGRAM COMPLETE  Result Date: 07/07/2019    ECHOCARDIOGRAM REPORT   Patient Name:   KAYNEN MINNER Date of Exam: 06/16/2019 Medical Rec #:  [id number]           Height:       69.0 in Accession #:    [id number]          Weight:       217.2 lb Date of Birth:  [birth year]           BSA:          2.140 m Patient Age:    88 years            BP:           134/76 mmHg Patient Gender: M                   HR:           139 bpm. Exam Location:  Inpatient Procedure: 2D Echo, Cardiac Doppler and Color Doppler Indications:    Bacteremia 790.7 / R78.81  History:        Patient has no prior history of Echocardiogram examinations.                 Risk Factors:Dyslipidemia and Diabetes. Cancer.  Sonographer:    Jonelle Sidle Dance Referring Phys: [place]  1. Left ventricular ejection fraction, by estimation, is 50 to 55%. The left ventricle has low normal function. The left ventricle has no regional wall motion abnormalities. Left ventricular diastolic parameters are consistent with Grade I diastolic dysfunction (impaired relaxation).  2. Right ventricular systolic function is normal. The right ventricular size is normal.  3. The mitral valve is normal in structure. No evidence of  mitral valve regurgitation. No evidence of mitral stenosis.  4. The aortic valve is normal in structure. Aortic valve regurgitation is not visualized. No aortic stenosis is present. FINDINGS  Left Ventricle: Left ventricular ejection fraction, by estimation, is 50 to 55%. The left ventricle has low normal function. The left ventricle has no regional wall motion abnormalities. The left ventricular internal cavity size was normal in size. There is no left ventricular hypertrophy. Left ventricular diastolic parameters are consistent with Grade I diastolic dysfunction (impaired relaxation). Right Ventricle: The right ventricular size is normal. No increase in right ventricular wall thickness. Right ventricular systolic function is normal. Left Atrium: Left atrial size was normal in size. Right Atrium: Right atrial size was normal in size. Pericardium: There is no evidence of pericardial effusion. Mitral Valve: The mitral valve is  normal in structure. No evidence of mitral valve regurgitation. No evidence of mitral valve stenosis. Tricuspid Valve: The tricuspid valve is normal in structure. Tricuspid valve regurgitation is trivial. No evidence of tricuspid stenosis. Aortic Valve: The aortic valve is normal in structure. Aortic valve regurgitation is not visualized. No aortic stenosis is present. Pulmonic Valve: The pulmonic valve was normal in structure. Pulmonic valve regurgitation is not visualized. No evidence of pulmonic stenosis. Aorta: The aortic root and ascending aorta are structurally normal, with no evidence of dilitation. IAS/Shunts: The atrial septum is grossly normal.  LEFT VENTRICLE PLAX 2D LVIDd:         4.40 cm LVIDs:         3.50 cm LV PW:         1.10 cm LV IVS:        1.10 cm LVOT diam:     2.00 cm LV SV:         75 LV SV Index:   35 LVOT Area:     3.14 cm  RIGHT VENTRICLE             IVC RV Basal diam:  2.70 cm     IVC diam: 2.40 cm RV S prime:     21.40 cm/s TAPSE (M-mode): 2.8 cm LEFT ATRIUM              Index       RIGHT ATRIUM           Index LA diam:        3.70 cm 1.73 cm/m  RA Area:     17.20 cm LA Vol (A2C):   29.4 ml 13.74 ml/m RA Volume:   48.40 ml  22.62 ml/m LA Vol (A4C):   29.1 ml 13.60 ml/m LA Biplane Vol: 31.6 ml 14.77 ml/m  AORTIC VALVE LVOT Vmax:   147.50 cm/s LVOT Vmean:  85.250 cm/s LVOT VTI:    0.238 m  AORTA Ao Root diam: 3.30 cm Ao Asc diam:  3.30 cm MITRAL VALVE MV Area (PHT): 5.20 cm     SHUNTS MV Decel Time: 146 msec     Systemic VTI:  0.24 m MV E velocity: 143.00 cm/s  Systemic Diam: 2.00 cm Mertie Moores MD Electronically signed by Mertie Moores MD Signature Date/Time: 06/30/2019/4:50:58 PM    Final     Assessment: 59 year old man with history of metastatic prostate cancer causing bilateral hydronephrosis.  Creatinine is continuing to increase.  Plan: -Patient is being seen by palliative care physician for pain control -I attempted to discuss the possibility of nephrostomy tubes with the patient however he was tired and did not want to speak to me this morning about them -Urology will be aware of this patient and please contact us again if the patient wishes to discuss the placement of nephrostomy tubes by interventional radiology   Jacalyn Lefevre, MD Urology 06/26/2019, 12:53 PM

## 2019-06-26 NOTE — Consult Note (Signed)
Palliative care consult note  Reason for consult: Goals of care in light of bacteremia, GI bleed, and metastatic pancreatic cancer  Palliative care consult received.  Chart reviewed including personal review of pertinent labs and imaging.  Discussed case with primary attending yesterday.  Briefly, Mr. Sobotka is a 59 year old male with past medical history of diabetes, stage IV prostate cancer status post orchiectomy and TURP who is currently on Zytiga.  He has had prior radiation for bone mets to hip and scapula as well as radiation to lumbar spine.  Bladder obstruction is causing high-grade hydronephrosis but he has declined nephrostomy tubes previously.  He has been referred for palliative care at home, but he has not had his first visit yet.  Currently admitted with anemia related to GI bleed, renal failure, and bacteremia.  Palliative consulted for goals of care.  I met today with Mr. Schreur.  He was awake and alert and appeared appropriate on examination but was very slow to answer any questions.  It appeared to take a very long time to process, but he was largely able to answer questions appropriately and in a logical manner.  He reports that his family, including his mother and son are the most important things to him.  He understands that he has incurable disease but is invested in plan to continue with any interventions that may add time or quality to his life.  We discussed his understanding of his situation including bacteremia, anemia related to GI bleed, and renal failure.  He states that he understands that recommendation had been made for nephrostomy tubes, however, he did not have a recommendation from his nephrologist to proceed with placement and that is why he has been declining.  He states he is open to interventions if they are recommended, however he often feels that it is difficult to have all of his specialists coordinate care so that he is sure there is consensus on the  best path moving forward.  He reports that if his care team thinks that nephrostomy tubes will be in his best interest, this is something he would reconsider.  We also discussed any other potential limits of his care moving forward.  He states that this time he had not had any thoughts or discussions regarding potential limitations of care including in regards to his CODE STATUS.  He feels at this point that he would want to continue with any and all aggressive interventions.  I called and was able to speak with his mother, Hassan Rowan.  Hassan Rowan reports that she and patient's son would service his surrogate decision makers if he is unable to make his own decisions.  We reviewed conversations with him from above and she reports that this is also in line with what she would understand to be her son's wishes.  While I am still not completely sure that he understands his condition and severity of his situation, it does appear that the consensus between my conversation with him and discussed with those would be his surrogate decision makers would be for continuation of full code/full scope treatment.  I had also discussed pain management with Mr. Kilner.  At the time of my encounter, he reports that his primary pain was in his head.  He had oxycodone earlier and he says that this did help.  He reports taking oxycodone chronically at home and review of his PDMP reveals home medication of hydrocodone/acetaminophen 5/325.  I agree with his current renal function that oxycodone may be a  better choice at this point.  He also tells me that he had tried fentanyl patch couple of months ago and it gave him a sense of fogginess.  While he is currently utilizing oral medication as his primary pain relief, I did change the morphine that he had ordered to fentanyl as I believe it would be a more appropriate choice with his current renal impairment.  This would also have less effect on blood pressure as sepsis has been a  concern.  -Full code full scope treatment -Discussed with patient as well as his mother.  His son was also present on the phone when I spoke with mother in agreement with care plan moving forward. -I changed his IV pain medication from morphine to fentanyl as needed due to his renal impairment.  He reports that he believes oxycodone works better and would continue with this his primary, however, I wanted to have something available as a backup in case this is ineffective or is unable to swallow effectively. -Discussed with his mother and will plan to check in in follow-up again on Monday.  Please call if there needs in the interim.  Time in: 1000 Time out: 1100 Total time: 60  Greater than 50%  of this time was spent counseling and coordinating care related to the above assessment and plan.  Micheline Rough, MD [facility] [phone]

## 2019-06-26 NOTE — Progress Notes (Addendum)
PROGRESS NOTE  Jeffery [name]:[id number] DOB: September 25, 1960 DOA: 07/14/2019 PCP: [name], MD  HPI/Recap of past 24 hours: Patient is a 59 year old gentleman with metastatic prostate cancer, diabetes, hyperlipidemia, chronic kidney disease stage IV with baseline creatinine about 2 who presented to the emergency room with generalized weakness, hypoxia and tachycardia, painful urination.  Patient also has chronic anemia and chronic guaiac positive stools.  He was working with therapies at home, he was found shaky and tremulous, oncology office was called who directed him to the ER.  In the emergency room, temperature 100.6, blood pressure stable.  Heart rate 140.  95% on room air.  WC count 11.8.  Hemoglobin 6.4.  Platelets were adequate.  BUN/creatinine was 59/3.54.  Patient was started on IV fluids and received blood transfusions.  TRH asked to admit.  A CT scan shows invasive prostate cancer into the bladder with severe bilateral hydronephrosis.  A Foley catheter was inserted.  06/26/19: Seen and examined.  Reports left lower quadrant abdominal pain.  Assessment/Plan: Principal Problem:   Severe sepsis (HCC) Active Problems:   Urinary retention   AKI (acute kidney injury) ([place])   Cholelithiasis   Diabetes mellitus, new onset ([place])   DM2 (diabetes mellitus, type 2) (HCC)   CKD (chronic kidney disease) stage 3, GFR 30-59 ml/min   Prostate cancer (HCC)   GI bleed   Heme positive stool   Anemia due to chronic blood loss  Sepsis, present on admission suspect from urinary source, Enterococcus bacteremia. Sepsis physiology is improving Blood pressure stable maintaining MAP greater than 65 Heart rate elevated in the 120s Continue IV fluid hydration Blood cultures grew Enterococcus faecalis, sensitive to ampicillin, IV vancomycin Ucx multiple species present, suggesting recollection Followed by Urology  Improving acute kidney injury on chronic kidney disease stage IV  likely postrenal 2/2 to obstruction from prostate cancer/bladder obstruction: Outpatient available renal functions with creatinine about 2.0 with GFR 36.   Continue foley cath Continue to monitor UO Dr. Justin Mend, patient's nephrology recommended to continue resuscitation and monitoring. Cr trending down to 3.3 from 3.8 GFR 20 UO 4.4L recorded in the last 24H  Hypokalemia K+ 3.2, repeated 3.5 Judicious replacement due to AKI  Anemia of chronic disease, positive FOBT: Probably acute on chronic anemia.  Received 2 units of PRBC.  Hg 7.2 Transfuse Hg <7.0 Repeat H&H tomorrow GI following for + FOBT Continue Protonix twice a day.   Resolved Hypernatremia: Due to isotonic fluid resuscitation.  Changed to half-normal saline and continue to monitor levels. Serum sodium 144  Type 2 diabetes: Controlled.  A1C 6.6 on 06/24/19.  On Metformin at home.  Discontinue all oral hypoglycemics.  Will keep on sliding scale insulin.  Metastatic prostate cancer, bladder obstruction, debility and chronic anemia: Followed by his oncologist.  Will have outpatient follow-up after stabilization.  Goals of care Followed by Palliative Medicine Full code  DVT prophylaxis: DVT prophylaxis with SCDs Code Status: Full code, patient on outpatient palliative therapy follow-up as per reports.  Will be seen by palliative medicine.  Family Communication: Will call family if ok with the patient.  Disposition Plan: patient is from home.. Anticipated DC to home, Barriers to discharge active treatment, IV antibiotics, inpatient procedures.   Consultants:   Urology  GI  Oncology  Palliative medicine  Procedures:   Foley cath insertion     Objective: Vitals:   06/26/19 0400 06/26/19 0500 06/26/19 0600 06/26/19 0800  BP: 107/61 115/78 (!) 141/83 138/76  Pulse: [name])  117 (!) 118 (!) 123 (!) 125  Resp: (!) 21 19 (!) 22 (!) 23  Temp: 99.8 F (37.7 C)   98.6 F (37 C)  TempSrc: Oral   Axillary  SpO2:  96% 99% 96% 92%  Weight:      Height:        Intake/Output Summary (Last 24 hours) at 06/26/2019 1041 Last data filed at 06/26/2019 0650 Gross per 24 hour  Intake 3307.69 ml  Output 4400 ml  Net -1092.31 ml   Filed Weights   06/24/19 0051 06/28/2019 1259  Weight: 98.5 kg 98.5 kg    Exam:  . General: 59 y.o. year-old male well developed well nourished in no acute distress.  Alert and oriented x3. . Cardiovascular: Regular rate and rhythm with no rubs or gallops.  No thyromegaly or JVD noted.   [name] Respiratory: Clear to auscultation with no wheezes or rales. Good inspiratory effort. . Abdomen: Soft nontender nondistended with normal bowel sounds x4 quadrants. . Musculoskeletal: Trace lower extremity edema. 2/4 pulses in all 4 extremities. [name] Psychiatry: Mood is appropriate for condition and setting   Data Reviewed: CBC: Recent Labs  Lab 06/16/2019 1714 06/18/2019 1714 06/24/19 0112 06/24/19 0858 06/27/2019 0216 06/26/19 0210 06/26/19 0621  WBC 11.8*  --  9.6 10.2 9.7 9.4  --   NEUTROABS 9.3*  --   --   --   --  7.4  --   HGB 6.4*   < > 5.7* 7.5* 7.4* 7.0* 7.2*  HCT 22.6*   < > 20.2* 26.0* 26.4* 25.0* 25.0*  MCV 88.6  --  89.0 89.3 92.6 90.6  --   PLT 271  --  372 336 249 228  --    < > = values in this interval not displayed.   Basic Metabolic Panel: Recent Labs  Lab 06/28/2019 1714 06/24/19 0858 07/11/2019 0216 06/26/19 0210 06/26/19 0621  NA 139 146* 154* 145  --   K 4.6 4.4 4.2 3.2* 3.5  CL 100 108 119* 113*  --   CO2 27 24 22  21*  --   GLUCOSE 182* 141* 113* 151*  --   BUN 59* 61* 57* 44*  --   CREATININE 3.54* 3.79* 3.82* 3.32*  --   CALCIUM 8.7* 8.2* 8.6* 9.0  --   MG  --   --  2.1 1.9  --   PHOS  --   --  5.7*  --   --    GFR: Estimated Creatinine Clearance: 28.7 mL/min (A) (by C-G formula based on SCr of 3.32 mg/dL (H)). Liver Function Tests: Recent Labs  Lab 06/26/2019 1714 06/24/19 0858 06/17/2019 0216 06/26/19 0210  AST 33 28 29 42*  ALT 28 23 22 26     ALKPHOS 224* 186* 158* 144*  BILITOT 0.8 1.0 0.5 0.5  PROT 7.3 6.5 6.1* 6.0*  ALBUMIN 2.2* 2.0* 2.0* 1.6*   No results for input(s): LIPASE, AMYLASE in the last 168 hours. Recent Labs  Lab 07/07/2019 1714  AMMONIA 14   Coagulation Profile: No results for input(s): INR, PROTIME in the last 168 hours. Cardiac Enzymes: No results for input(s): CKTOTAL, CKMB, CKMBINDEX, TROPONINI in the last 168 hours. BNP (last 3 results) No results for input(s): PROBNP in the last 8760 hours. HbA1C: Recent Labs    06/24/19 0854  HGBA1C 6.6*   CBG: Recent Labs  Lab 07/05/2019 0633 07/07/2019 1225 06/26/2019 1725 06/23/2019 2351 06/26/19 0504  GLUCAP 129* 140* 139* 160* 124*   Lipid Profile:  No results for input(s): CHOL, HDL, LDLCALC, TRIG, CHOLHDL, LDLDIRECT in the last 72 hours. Thyroid Function Tests: No results for input(s): TSH, T4TOTAL, FREET4, T3FREE, THYROIDAB in the last 72 hours. Anemia Panel: No results for input(s): VITAMINB12, FOLATE, FERRITIN, TIBC, IRON, RETICCTPCT in the last 72 hours. Urine analysis:    Component Value Date/Time   COLORURINE YELLOW 07/11/2019 1537   APPEARANCEUR CLEAR 06/22/2019 1537   LABSPEC 1.006 06/24/2019 1537   PHURINE 6.0 06/28/2019 1537   GLUCOSEU NEGATIVE 06/29/2019 1537   HGBUR MODERATE (A) 07/14/2019 1537   BILIRUBINUR NEGATIVE 06/16/2019 1537   KETONESUR NEGATIVE 06/19/2019 1537   PROTEINUR 30 (A) 06/30/2019 1537   NITRITE NEGATIVE 07/14/2019 1537   LEUKOCYTESUR LARGE (A) 07/07/2019 1537   Sepsis Labs: @LABRCNTIP (procalcitonin:4,lacticidven:4)  ) Recent Results (from the past 240 hour(s))  Urine culture     Status: Abnormal   Collection Time: 06/21/2019  3:37 PM   Specimen: Urine, Random  Result Value Ref Range Status   Specimen Description   Final    URINE, RANDOM Performed at [facility], [place] [address] [place], [place] [address]    Special Requests   Final    NONE Performed at [facility],  [place] [address] [place], [place] [address]    Culture MULTIPLE SPECIES PRESENT, SUGGEST RECOLLECTION (A)  Final   Report Status 06/24/2019 FINAL  Final  Culture, blood (routine x 2)     Status: Abnormal   Collection Time: 06/26/2019  4:56 PM   Specimen: BLOOD  Result Value Ref Range Status   Specimen Description   Final    BLOOD RIGHT ANTECUBITAL Performed at [place] [address] [place], [place] [address]    Special Requests   Final    BOTTLES DRAWN AEROBIC ONLY Blood Culture results may not be optimal due to an excessive volume of blood received in culture bottles Performed at [place] [address], [place], [place] [address]    Culture  Setup Time   Final    AEROBIC BOTTLE ONLY GRAM POSITIVE COCCI IN CHAINS CRITICAL RESULT CALLED TO, READ BACK BY AND VERIFIED WITH: Lavell Luster [facility] 06/22/2019 0032 JDW Performed at [facility], [place] [address], [place], [place] [address]    Culture ENTEROCOCCUS FAECALIS (A)  Final   Report Status 06/26/2019 FINAL  Final   Organism ID, Bacteria ENTEROCOCCUS FAECALIS  Final      Susceptibility   Enterococcus faecalis - MIC*    AMPICILLIN <=2 SENSITIVE Sensitive     VANCOMYCIN 1 SENSITIVE Sensitive     GENTAMICIN SYNERGY SENSITIVE Sensitive     * ENTEROCOCCUS FAECALIS  Blood Culture ID Panel (Reflexed)     Status: Abnormal   Collection Time: 06/21/2019  4:56 PM  Result Value Ref Range Status   Enterococcus species DETECTED (A) NOT DETECTED Final    Comment: CRITICAL RESULT CALLED TO, READ BACK BY AND VERIFIED WITH: J. GRIMSLEY PHARMD 06/25/19 0032 JDW    Vancomycin resistance NOT DETECTED NOT DETECTED Final   Listeria monocytogenes NOT DETECTED NOT DETECTED Final   Staphylococcus species NOT DETECTED NOT DETECTED Final   Staphylococcus aureus (BCID) NOT DETECTED NOT DETECTED Final   Streptococcus species NOT DETECTED NOT DETECTED Final   Streptococcus agalactiae NOT DETECTED NOT DETECTED  Final   Streptococcus pneumoniae NOT DETECTED NOT DETECTED Final   Streptococcus pyogenes NOT DETECTED NOT DETECTED Final   Acinetobacter baumannii NOT DETECTED NOT DETECTED Final  Enterobacteriaceae species NOT DETECTED NOT DETECTED Final   Enterobacter cloacae complex NOT DETECTED NOT DETECTED Final   Escherichia coli NOT DETECTED NOT DETECTED Final   Klebsiella oxytoca NOT DETECTED NOT DETECTED Final   Klebsiella pneumoniae NOT DETECTED NOT DETECTED Final   Proteus species NOT DETECTED NOT DETECTED Final   Serratia marcescens NOT DETECTED NOT DETECTED Final   Haemophilus influenzae NOT DETECTED NOT DETECTED Final   Neisseria meningitidis NOT DETECTED NOT DETECTED Final   Pseudomonas aeruginosa NOT DETECTED NOT DETECTED Final   Candida albicans NOT DETECTED NOT DETECTED Final   Candida glabrata NOT DETECTED NOT DETECTED Final   Candida krusei NOT DETECTED NOT DETECTED Final   Candida parapsilosis NOT DETECTED NOT DETECTED Final   Candida tropicalis NOT DETECTED NOT DETECTED Final    Comment: Performed at [facility], [place] [address] [place], [place] [address]  Culture, blood (routine x 2)     Status: Abnormal   Collection Time: 07/06/2019  5:16 PM   Specimen: BLOOD  Result Value Ref Range Status   Specimen Description   Final    BLOOD RIGHT ANTECUBITAL Performed at [place] [address], [place], [place] [address]    Special Requests   Final    BOTTLES DRAWN AEROBIC AND ANAEROBIC Blood Culture adequate volume Performed at [place] [address], [place], [place] [address]    Culture  Setup Time   Final    GRAM POSITIVE COCCI IN CHAINS IN BOTH AEROBIC AND ANAEROBIC BOTTLES CRITICAL RESULT CALLED TO, READ BACK BY AND VERIFIED WITH: PHARMD M RENZ [id number] MLM    Culture (A)  Final    ENTEROCOCCUS FAECALIS SUSCEPTIBILITIES PERFORMED ON PREVIOUS CULTURE WITHIN THE LAST 5 DAYS. Performed at [facility], [place] [address], [place], [place] [address]    Report Status 06/26/2019 FINAL  Final  SARS CORONAVIRUS 2 (TAT 6-24 HRS) Nasopharyngeal Nasopharyngeal Swab     Status: None   Collection Time: 07/10/2019  5:20 PM   Specimen: Nasopharyngeal Swab  Result Value Ref Range Status   SARS Coronavirus 2 NEGATIVE NEGATIVE Final    Comment: (NOTE) SARS-CoV-2 target nucleic acids are NOT DETECTED. The SARS-CoV-2 RNA is generally detectable in upper and lower respiratory specimens during the acute phase of infection. Negative results do not preclude SARS-CoV-2 infection, do not rule out co-infections with other pathogens, and should not be used as the sole basis for treatment or other patient management decisions. Negative results must be combined with clinical observations, patient history, and epidemiological information. The expected result is Negative. Fact Sheet for Patients: SugarRoll.be Fact Sheet for Healthcare Providers: https://www.woods-mathews.com/ This test is not yet approved or cleared by the [place] FDA and  has been authorized for detection and/or diagnosis of SARS-CoV-2 by FDA under an Emergency Use Authorization (EUA). This EUA will remain  in effect (meaning this test can be used) for the duration of the COVID-19 declaration under Section 56 4(b)(1) of the Act, 21 U.S.C. section 360bbb-3(b)(1), unless the authorization is terminated or revoked sooner. Performed at [facility], [place] [address], [place], [place] [address]   MRSA PCR Screening     Status: None   Collection Time: 06/24/19  1:08 AM   Specimen: Nasal Mucosa; Nasopharyngeal  Result Value Ref Range Status   MRSA by PCR NEGATIVE NEGATIVE Final    Comment:        The GeneXpert MRSA Assay (FDA approved for NASAL  specimens only), is one component of a comprehensive MRSA colonization surveillance program. It is not intended to diagnose MRSA infection nor to guide or monitor  treatment for MRSA infections. Performed at [facility], [place] [address] [place],  [address]       Studies: ECHOCARDIOGRAM COMPLETE  Result Date: 07/12/2019    ECHOCARDIOGRAM REPORT   Patient Name:   Jeffery [name] Date of Exam: 06/18/2019 Medical Rec #:  [id number]           Height:       69.0 in Accession #:    [id number]          Weight:       217.2 lb Date of Birth:  17-Sep-1960           BSA:          2.140 m Patient Age:    40 years            BP:           134/76 mmHg Patient Gender: M                   HR:           139 bpm. Exam Location:  Inpatient Procedure: 2D Echo, Cardiac Doppler and Color Doppler Indications:    Bacteremia 790.7 / R78.81  History:        Patient has no prior history of Echocardiogram examinations.                 Risk Factors:Dyslipidemia and Diabetes. Cancer.  Sonographer:    Jonelle Sidle Dance Referring Phys: [place]  1. Left ventricular ejection fraction, by estimation, is 50 to 55%. The left ventricle has low normal function. The left ventricle has no regional wall motion abnormalities. Left ventricular diastolic parameters are consistent with Grade I diastolic dysfunction (impaired relaxation).  2. Right ventricular systolic function is normal. The right ventricular size is normal.  3. The mitral valve is normal in structure. No evidence of mitral valve regurgitation. No evidence of mitral stenosis.  4. The aortic valve is normal in structure. Aortic valve regurgitation is not visualized. No aortic stenosis is present. FINDINGS  Left Ventricle: Left ventricular ejection fraction, by estimation, is 50 to 55%. The left ventricle has low normal function. The left ventricle has no regional wall motion abnormalities. The left ventricular internal cavity size was normal in size. There is no left ventricular hypertrophy. Left ventricular diastolic parameters are consistent with Grade I diastolic dysfunction (impaired relaxation).  Right Ventricle: The right ventricular size is normal. No increase in right ventricular wall thickness. Right ventricular systolic function is normal. Left Atrium: Left atrial size was normal in size. Right Atrium: Right atrial size was normal in size. Pericardium: There is no evidence of pericardial effusion. Mitral Valve: The mitral valve is normal in structure. No evidence of mitral valve regurgitation. No evidence of mitral valve stenosis. Tricuspid Valve: The tricuspid valve is normal in structure. Tricuspid valve regurgitation is trivial. No evidence of tricuspid stenosis. Aortic Valve: The aortic valve is normal in structure. Aortic valve regurgitation is not visualized. No aortic stenosis is present. Pulmonic Valve: The pulmonic valve was normal in structure. Pulmonic valve regurgitation is not visualized. No evidence of pulmonic stenosis. Aorta: The aortic root and ascending aorta are structurally normal, with no evidence of dilitation. IAS/Shunts: The atrial septum is grossly normal.  LEFT VENTRICLE PLAX 2D LVIDd:  4.40 cm LVIDs:         3.50 cm LV PW:         1.10 cm LV IVS:        1.10 cm LVOT diam:     2.00 cm LV SV:         75 LV SV Index:   35 LVOT Area:     3.14 cm  RIGHT VENTRICLE             IVC RV Basal diam:  2.70 cm     IVC diam: 2.40 cm RV S prime:     21.40 cm/s TAPSE (M-mode): 2.8 cm LEFT ATRIUM             Index       RIGHT ATRIUM           Index LA diam:        3.70 cm 1.73 cm/m  RA Area:     17.20 cm LA Vol (A2C):   29.4 ml 13.74 ml/m RA Volume:   48.40 ml  22.62 ml/m LA Vol (A4C):   29.1 ml 13.60 ml/m LA Biplane Vol: 31.6 ml 14.77 ml/m  AORTIC VALVE LVOT Vmax:   147.50 cm/s LVOT Vmean:  85.250 cm/s LVOT VTI:    0.238 m  AORTA Ao Root diam: 3.30 cm Ao Asc diam:  3.30 cm MITRAL VALVE MV Area (PHT): 5.20 cm     SHUNTS MV Decel Time: 146 msec     Systemic VTI:  0.24 m MV E velocity: 143.00 cm/s  Systemic Diam: 2.00 cm Mertie Moores MD Electronically signed by Mertie Moores MD  Signature Date/Time: 06/26/2019/4:50:58 PM    Final     Scheduled Meds: . Chlorhexidine Gluconate Cloth  6 each Topical Daily  . gabapentin  300 mg Oral TID  . insulin aspart  0-9 Units Subcutaneous Q6H  . mouth rinse  15 mL Mouth Rinse BID  . pantoprazole  40 mg Oral Q0600    Continuous Infusions: . sodium chloride 75 mL/hr at 06/26/19 0600  . sodium chloride Stopped (07/14/2019 1846)  . ampicillin (OMNIPEN) IV Stopped (06/26/19 0536)     LOS: 3 days     Kayleen Memos, MD [facility] Pager [phone]  If 7PM-7AM, please contact night-coverage [email or web address] Password [facility] 06/26/2019, 10:41 AM

## 2019-06-26 NOTE — Progress Notes (Signed)
Pt had an episode of hemoptysis and epistaxis at 00:50. Pt heart rate sustained at 140's and pt stated he feels anxious. Paged MD. Humphrey Rolls MD ordered Labetalol 10 mg IV. Will continue to monitor pt.

## 2019-06-27 ENCOUNTER — Inpatient Hospital Stay (HOSPITAL_COMMUNITY): Payer: Medicaid Other

## 2019-06-27 ENCOUNTER — Encounter (HOSPITAL_COMMUNITY): Payer: Medicaid Other

## 2019-06-27 LAB — PREPARE RBC (CROSSMATCH)

## 2019-06-27 LAB — CBC WITH DIFFERENTIAL/PLATELET
Abs Immature Granulocytes: 0.31 10*3/uL — ABNORMAL HIGH (ref 0.00–0.07)
Basophils Absolute: 0 10*3/uL (ref 0.0–0.1)
Basophils Relative: 0 %
Eosinophils Absolute: 0.2 10*3/uL (ref 0.0–0.5)
Eosinophils Relative: 2 %
HCT: 27.7 % — ABNORMAL LOW (ref 39.0–52.0)
Hemoglobin: 7.6 g/dL — ABNORMAL LOW (ref 13.0–17.0)
Immature Granulocytes: 4 %
Lymphocytes Relative: 14 %
Lymphs Abs: 1.2 10*3/uL (ref 0.7–4.0)
MCH: 24.9 pg — ABNORMAL LOW (ref 26.0–34.0)
MCHC: 27.4 g/dL — ABNORMAL LOW (ref 30.0–36.0)
MCV: 90.8 fL (ref 80.0–100.0)
Monocytes Absolute: 0.4 10*3/uL (ref 0.1–1.0)
Monocytes Relative: 5 %
Neutro Abs: 6.8 10*3/uL (ref 1.7–7.7)
Neutrophils Relative %: 75 %
Platelets: 225 10*3/uL (ref 150–400)
RBC: 3.05 MIL/uL — ABNORMAL LOW (ref 4.22–5.81)
RDW: 18.3 % — ABNORMAL HIGH (ref 11.5–15.5)
WBC: 9 10*3/uL (ref 4.0–10.5)
nRBC: 0 % (ref 0.0–0.2)

## 2019-06-27 LAB — PROCALCITONIN: Procalcitonin: 8.4 ng/mL

## 2019-06-27 LAB — COMPREHENSIVE METABOLIC PANEL
ALT: 26 U/L (ref 0–44)
AST: 40 U/L (ref 15–41)
Albumin: 1.7 g/dL — ABNORMAL LOW (ref 3.5–5.0)
Alkaline Phosphatase: 158 U/L — ABNORMAL HIGH (ref 38–126)
Anion gap: 11 (ref 5–15)
BUN: 41 mg/dL — ABNORMAL HIGH (ref 6–20)
CO2: 24 mmol/L (ref 22–32)
Calcium: 9.4 mg/dL (ref 8.9–10.3)
Chloride: 113 mmol/L — ABNORMAL HIGH (ref 98–111)
Creatinine, Ser: 3.2 mg/dL — ABNORMAL HIGH (ref 0.61–1.24)
GFR calc Af Amer: 24 mL/min — ABNORMAL LOW (ref >60)
GFR calc non Af Amer: 21 mL/min — ABNORMAL LOW (ref >60)
Glucose, Bld: 128 mg/dL — ABNORMAL HIGH (ref 70–99)
Potassium: 3.6 mmol/L (ref 3.5–5.1)
Sodium: 148 mmol/L — ABNORMAL HIGH (ref 135–145)
Total Bilirubin: 0.8 mg/dL (ref 0.3–1.2)
Total Protein: 5.9 g/dL — ABNORMAL LOW (ref 6.5–8.1)

## 2019-06-27 LAB — BASIC METABOLIC PANEL
Anion gap: 11 (ref 5–15)
BUN: 37 mg/dL — ABNORMAL HIGH (ref 6–20)
CO2: 23 mmol/L (ref 22–32)
Calcium: 9.2 mg/dL (ref 8.9–10.3)
Chloride: 112 mmol/L — ABNORMAL HIGH (ref 98–111)
Creatinine, Ser: 3.14 mg/dL — ABNORMAL HIGH (ref 0.61–1.24)
GFR calc Af Amer: 24 mL/min — ABNORMAL LOW (ref >60)
GFR calc non Af Amer: 21 mL/min — ABNORMAL LOW (ref >60)
Glucose, Bld: 136 mg/dL — ABNORMAL HIGH (ref 70–99)
Potassium: 3.7 mmol/L (ref 3.5–5.1)
Sodium: 146 mmol/L — ABNORMAL HIGH (ref 135–145)

## 2019-06-27 LAB — GLUCOSE, CAPILLARY
Glucose-Capillary: 124 mg/dL — ABNORMAL HIGH (ref 70–99)
Glucose-Capillary: 142 mg/dL — ABNORMAL HIGH (ref 70–99)
Glucose-Capillary: 141 mg/dL — ABNORMAL HIGH (ref 70–99)
Glucose-Capillary: 147 mg/dL — ABNORMAL HIGH (ref 70–99)
Glucose-Capillary: 156 mg/dL — ABNORMAL HIGH (ref 70–99)

## 2019-06-27 LAB — HEMOGLOBIN AND HEMATOCRIT, BLOOD
HCT: 25.1 % — ABNORMAL LOW (ref 39.0–52.0)
Hemoglobin: 7.1 g/dL — ABNORMAL LOW (ref 13.0–17.0)

## 2019-06-27 LAB — LACTIC ACID, PLASMA: Lactic Acid, Venous: 0.8 mmol/L (ref 0.5–1.9)

## 2019-06-27 IMAGING — DX DG CHEST 1V PORT
1 series · 1 of 1 positions shown · non-contrast
Comparison: [DATE]

CLINICAL DATA: Hypoxia

EXAM:
PORTABLE CHEST 1 VIEW

[chest ap]
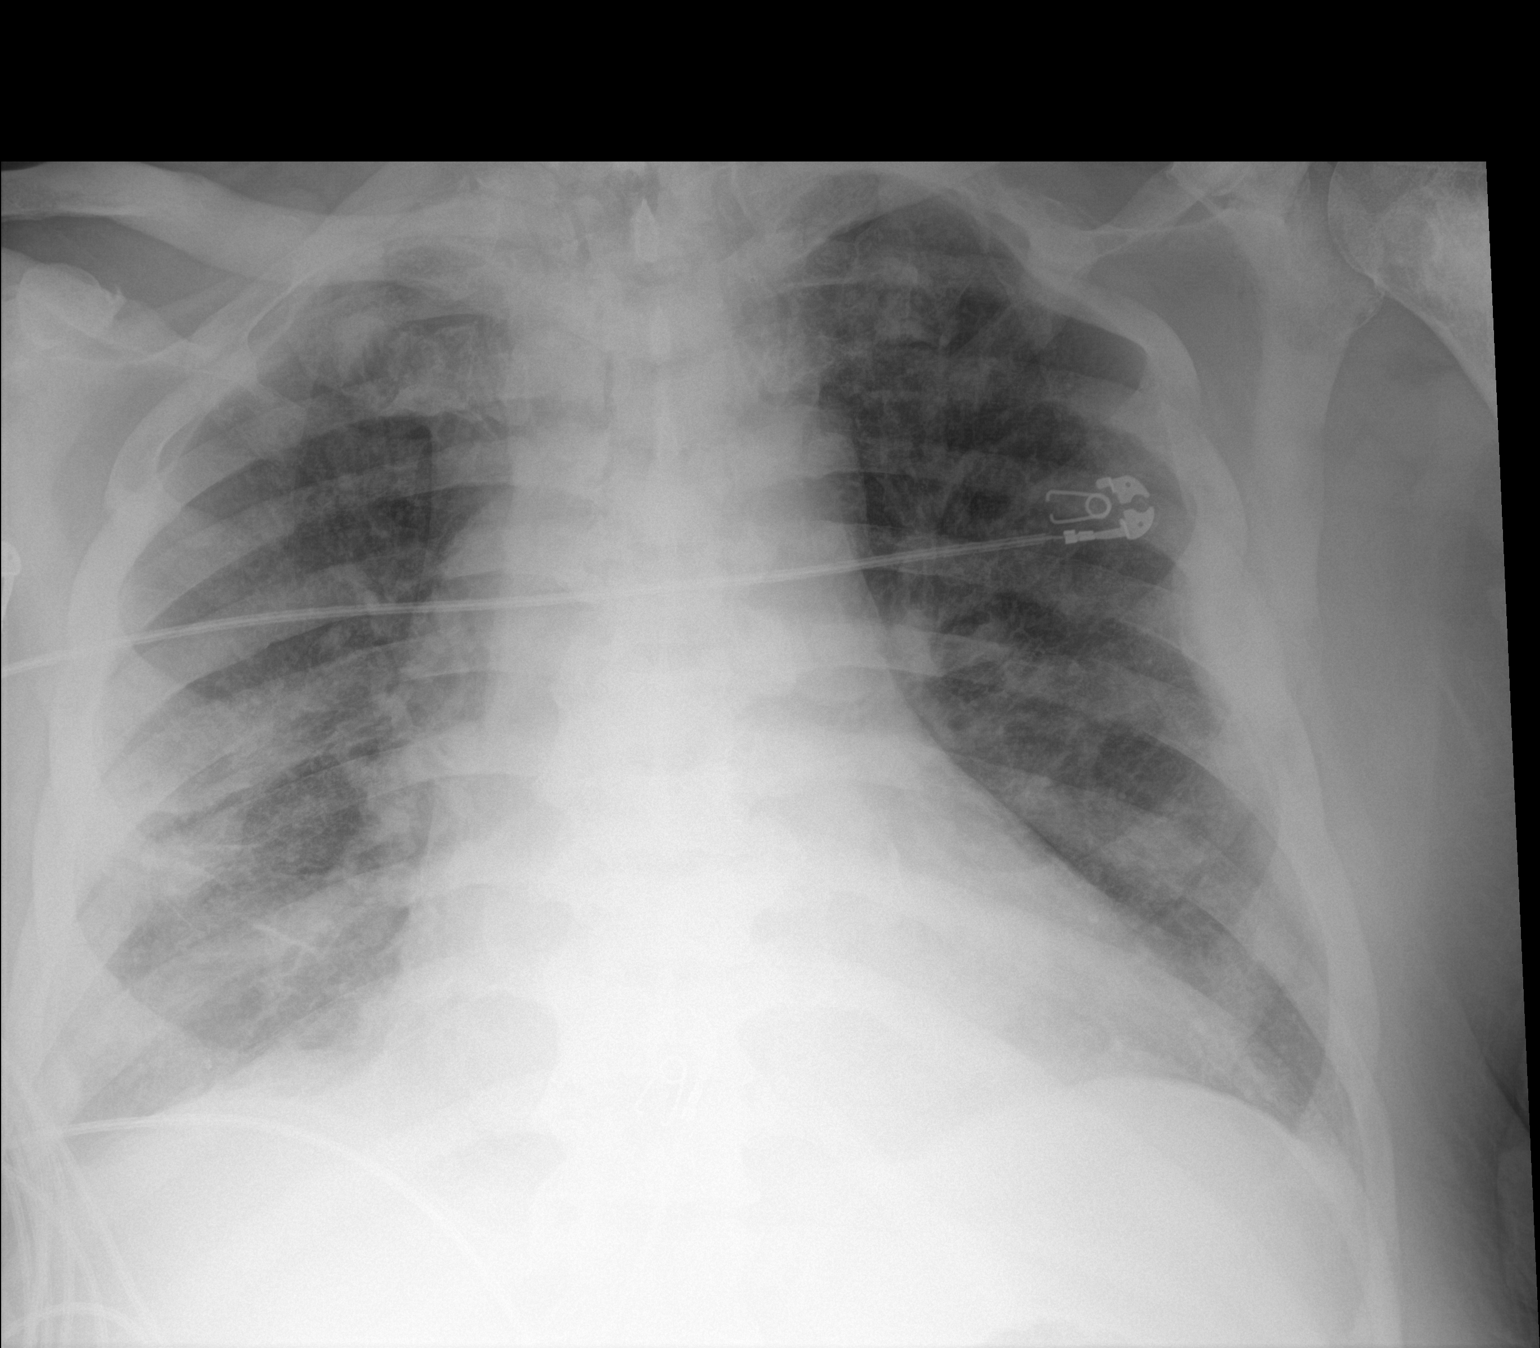

[1 of 1 positions shown; findings below may reference images not displayed]

FINDINGS: Mild patchy right lower lung opacity, more conspicuous than on the
prior, mild infection not excluded. Associated right basilar
opacity, likely reflecting a combination of atelectasis and small
pleural effusion when correlating with prior CT. Left lung is
essentially clear.

Cardiomegaly.

Multifocal/diffuse osseous metastases.
IMPRESSION: Mild patchy right lung opacity, mild infection not excluded.

Small right pleural effusion with mild right lower lobe atelectasis.

Multifocal/diffuse osseous metastases.

## 2019-06-27 MED ORDER — HEPARIN SODIUM (PORCINE) 5000 UNIT/ML IJ SOLN
5000.0000 [IU] | Freq: Three times a day (TID) | INTRAMUSCULAR | Status: DC
  Administered 2019-06-27: 5000 [IU] via SUBCUTANEOUS
  Filled 2019-06-27: qty 1

## 2019-06-27 MED ORDER — FUROSEMIDE 10 MG/ML IJ SOLN
60.0000 mg | Freq: Once | INTRAMUSCULAR | Status: AC
  Administered 2019-06-27: 60 mg via INTRAVENOUS
  Filled 2019-06-27: qty 6

## 2019-06-27 MED ORDER — METOPROLOL TARTRATE 12.5 MG HALF TABLET
12.5000 mg | ORAL_TABLET | Freq: Two times a day (BID) | ORAL | Status: DC
  Administered 2019-06-27 – 2019-07-02 (×10): 12.5 mg via ORAL
  Filled 2019-06-27 (×10): qty 1

## 2019-06-27 MED ORDER — METOPROLOL TARTRATE 5 MG/5ML IV SOLN
2.5000 mg | INTRAVENOUS | Status: DC | PRN
  Administered 2019-06-27 – 2019-07-03 (×8): 2.5 mg via INTRAVENOUS
  Filled 2019-06-27 (×8): qty 5

## 2019-06-27 MED ORDER — FUROSEMIDE 10 MG/ML IJ SOLN
60.0000 mg | Freq: Three times a day (TID) | INTRAMUSCULAR | Status: DC
  Administered 2019-06-27 – 2019-06-28 (×2): 60 mg via INTRAVENOUS
  Filled 2019-06-27 (×2): qty 6

## 2019-06-27 MED ORDER — SODIUM CHLORIDE 0.9 % IV SOLN
2.0000 g | INTRAVENOUS | Status: DC
  Administered 2019-06-27: 2 g via INTRAVENOUS
  Filled 2019-06-27: qty 2
  Filled 2019-06-27: qty 20

## 2019-06-27 MED ORDER — SODIUM CHLORIDE 0.9% IV SOLUTION: Freq: Once | INTRAVENOUS | Status: AC

## 2019-06-27 MED ORDER — HEPARIN SODIUM (PORCINE) 5000 UNIT/ML IJ SOLN: 5000.0000 [IU] | Freq: Three times a day (TID) | INTRAMUSCULAR | Status: DC

## 2019-06-27 NOTE — Progress Notes (Signed)
Urine is red with some small clots noted notified Dr. [place] Crane new order to  hold heparin for now.

## 2019-06-27 NOTE — Progress Notes (Addendum)
PROGRESS NOTE  Jeffery [name] ZDG:[id number] DOB: May 11, 1960 DOA: 06/21/2019 PCP: [name], MD  HPI/Recap of past 24 hours: Patient is a 59 year old gentleman with stage IV prostate cancer, type II diabetes, hyperlipidemia, chronic kidney disease stage IIIB with baseline creatinine about 2 who presented to the emergency room with generalized weakness, hypoxia and tachycardia, painful urination.  Patient also has chronic anemia and chronic guaiac positive stools.  He was working with therapies at home, he was found shaky and tremulous, oncology office was called who directed him to the ER.  Febrile tachycardic in the ED.  Hemoglobin 6.4.  Transfuse 2 unit PRBCs.  Creatinine up to 3.54.   A CT scan shows invasive prostate cancer into the bladder with severe bilateral hydronephrosis.  A Foley catheter was inserted.  TRH was asked to admit.  06/27/19: Seen and examined.  Febrile overnight with T-max 100.5, tachycardic.  Hypoxic requiring 2 L oxygen supplementation to maintain O2 saturation greater than 92%.  Reports worsening left lower extremity pain.  Doppler ultrasound ordered to further assess.   Assessment/Plan: Principal Problem:   Severe sepsis (HCC) Active Problems:   Urinary retention   AKI (acute kidney injury) ([place])   Cholelithiasis   Diabetes mellitus, new onset ([place])   DM2 (diabetes mellitus, type 2) (HCC)   CKD (chronic kidney disease) stage 3, GFR 30-59 ml/min   Prostate cancer (HCC)   GI bleed   Heme positive stool   Anemia due to chronic blood loss  Sepsis, present on admission suspect from urinary source, Enterococcus bacteremia. Sepsis physiology is improving Blood pressure stable maintaining MAP greater than 65 Heart rate elevated in the 120s IV fluid stopped due to suspected pulmonary edema on CXR and hypoxia Blood cultures grew Enterococcus faecalis, sensitive to ampicillin Ucx multiple species present, suggesting recollection  Enterococcus  bacteremia 2/2 bottles Management as per above Procalcitonin is 8.4 Repeated blood cultures on 06/26/2019 in process.  Follow cultures TTE shows no evidence of endocarditis Colonoscopy planned once hemodynamically stable  Acute hypoxic respiratory failure secondary to suspected pulmonary edema versus CAP Not on oxygen supplementation at baseline Plan requiring 2 L to maintain O2 sats was under 92% Independently viewed chest x-ray done this morning which showed increasing pulmonary vascularity and right pleural effusion. Stopped IV fluid and given 1 dose of IV Lasix 60 mg once  Suspected CAP Independently reviewed CXR showing increase in pulmonary vascularity with RLL opacity.  CAP could not be excluded Start Rocephin empirically Monitor fever curve and WBC  Refractory sinus tachycardia likely multifactorial 2/2 to sepsis versus fever versus other ST on 12 lead EKG Fever with Tmax 102.2 Added po lopressor 12.5 mg BID IV lopressor 2.5 mg q4H prn with HR>130 when MAP>70  Resolved prolonged QTC 12 lead EKG on 3/12 QTC >540 Repeated 12 lead EKG done on 3/14 QTc 353  Left lower extremity pain Chronic left lower extremity weakness with worsening pain Obtain duplex ultrasound to rule out DVT  Improving acute kidney injury on chronic kidney disease stage IIIB likely postrenal versus prerenal 2/2 to obstructive uropathy from prostate cancer versus intravascular volume depletion:  Baseline creatinine appears to be 2.0 with GFR of 36 Creatinine up to 3.8 Creatinine trending down to 3.2 Continue to avoid nephrotoxins IV fluids discontinued due to suspected pulmonary edema and hypoxia  Continue Foley catheter #3 Continue to monitor urine output Nephrology Dr. Jonnie Finner will see in consultation.  Resolved post repletion: Hypokalemia K+ 3.2, repeated 3.5>> 3.6. BMP repeated at  1700 post IV Lasix 60 mg once.  Anemia of chronic disease, positive FOBT: Probably acute on chronic anemia.    Received 2 units of PRBC.  Hg 7.2>>7.6 Transfuse Hg <7.0 Continue to monitor H&H GI following for + FOBT Continue Protonix daily.  Hypervolemic Hypernatremia:  Encourage free water oral intake instead DCed IV fluid  Type 2 diabetes: Controlled.  A1C 6.6 on 06/24/19.  On Metformin at home.  Continue sliding scale.  Metastatic prostate cancer on Zytiga, bladder obstruction, debility and chronic anemia: Followed by his oncologist.  Will have outpatient follow-up after stabilization. Zytiga on hold  Goals of care Followed by Palliative Medicine Full code  DVT prophylaxis: DVT prophylaxis with SCDs Code Status: Full code  Family Communication:  Updated the patient's mother via phone on 06/27/2019.  Disposition Plan: patient is from home. Anticipated DC to home, Barriers to discharge active treatment, IV antibiotics, inpatient procedures.   Consultants:   Urology  GI  Oncology  Palliative medicine  Nephrology  Procedures:   Foley cath insertion     Objective: Vitals:   06/27/19 0745 06/27/19 0800 06/27/19 1020 06/27/19 1228  BP:   139/85   Pulse:   (!) 126   Resp:      Temp: 98.3 F (36.8 C) 98.6 F (37 C)  98.6 F (37 C)  TempSrc: Oral Oral  Oral  SpO2:      Weight:      Height:        Intake/Output Summary (Last 24 hours) at 06/27/2019 1233 Last data filed at 06/27/2019 1228 Gross per 24 hour  Intake 2934.67 ml  Output 4600 ml  Net -1665.33 ml   Filed Weights   06/24/19 0051 07/03/2019 1259  Weight: 98.5 kg 98.5 kg    Exam:  . General: 59 y.o. year-old male well-developed well-nourished no acute distress.  Alert and oriented x3.   . Cardiovascular: Regular rate and rhythm no rubs or gallops. [name] Respiratory: Mild rales at bases.  No wheezing noted.  .  Abdomen: Soft nontender monitor normal bowel sounds present . Musculoskeletal: Trace lower extremity edema bilaterally.  Tenderness to palpation on left lower extremity calf. [name] Psychiatry:  Mood is appropriate for condition and setting.   Data Reviewed: CBC: Recent Labs  Lab 06/19/2019 1714 07/14/2019 1714 06/24/19 0112 06/24/19 0112 06/24/19 1093 06/19/2019 0216 06/26/19 0210 06/26/19 0621 06/27/19 0317  WBC 11.8*   < > 9.6  --  10.2 9.7 9.4  --  9.0  NEUTROABS 9.3*  --   --   --   --   --  7.4  --  6.8  HGB 6.4*   < > 5.7*   < > 7.5* 7.4* 7.0* 7.2* 7.6*  HCT 22.6*   < > 20.2*   < > 26.0* 26.4* 25.0* 25.0* 27.7*  MCV 88.6   < > 89.0  --  89.3 92.6 90.6  --  90.8  PLT 271   < > 372  --  336 249 228  --  225   < > = values in this interval not displayed.   Basic Metabolic Panel: Recent Labs  Lab 06/16/2019 1714 07/13/2019 1714 06/24/19 2355 06/17/2019 0216 06/26/19 0210 06/26/19 0621 06/27/19 0317  NA 139  --  146* 154* 145  --  148*  K 4.6   < > 4.4 4.2 3.2* 3.5 3.6  CL 100  --  108 119* 113*  --  113*  CO2 27  --  24 22 21*  --  24  GLUCOSE 182*  --  141* 113* 151*  --  128*  BUN 59*  --  61* 57* 44*  --  41*  CREATININE 3.54*  --  3.79* 3.82* 3.32*  --  3.20*  CALCIUM 8.7*  --  8.2* 8.6* 9.0  --  9.4  MG  --   --   --  2.1 1.9  --   --   PHOS  --   --   --  5.7*  --   --   --    < > = values in this interval not displayed.   GFR: Estimated Creatinine Clearance: 29.8 mL/min (A) (by C-G formula based on SCr of 3.2 mg/dL (H)). Liver Function Tests: Recent Labs  Lab 06/16/2019 1714 06/24/19 0858 06/29/2019 0216 06/26/19 0210 06/27/19 0317  AST 33 28 29 42* 40  ALT 28 23 22 26 26   ALKPHOS 224* 186* 158* 144* 158*  BILITOT 0.8 1.0 0.5 0.5 0.8  PROT 7.3 6.5 6.1* 6.0* 5.9*  ALBUMIN 2.2* 2.0* 2.0* 1.6* 1.7*   No results for input(s): LIPASE, AMYLASE in the last 168 hours. Recent Labs  Lab 06/21/2019 1714  AMMONIA 14   Coagulation Profile: No results for input(s): INR, PROTIME in the last 168 hours. Cardiac Enzymes: No results for input(s): CKTOTAL, CKMB, CKMBINDEX, TROPONINI in the last 168 hours. BNP (last 3 results) No results for input(s): PROBNP in  the last 8760 hours. HbA1C: No results for input(s): HGBA1C in the last 72 hours. CBG: Recent Labs  Lab 06/25/19 2351 06/26/19 0504 06/26/19 1143 06/26/19 1725 06/26/19 2337  GLUCAP 160* 124* 133* 142* 124*   Lipid Profile: No results for input(s): CHOL, HDL, LDLCALC, TRIG, CHOLHDL, LDLDIRECT in the last 72 hours. Thyroid Function Tests: No results for input(s): TSH, T4TOTAL, FREET4, T3FREE, THYROIDAB in the last 72 hours. Anemia Panel: No results for input(s): VITAMINB12, FOLATE, FERRITIN, TIBC, IRON, RETICCTPCT in the last 72 hours. Urine analysis:    Component Value Date/Time   COLORURINE YELLOW 06/29/2019 1537   APPEARANCEUR CLEAR 06/18/2019 1537   LABSPEC 1.006 06/27/2019 1537   PHURINE 6.0 06/24/2019 1537   GLUCOSEU NEGATIVE 07/14/2019 1537   HGBUR MODERATE (A) 06/15/2019 1537   BILIRUBINUR NEGATIVE 06/28/2019 1537   KETONESUR NEGATIVE 06/14/2019 1537   PROTEINUR 30 (A) 06/15/2019 1537   NITRITE NEGATIVE 07/04/2019 1537   LEUKOCYTESUR LARGE (A) 07/09/2019 1537   Sepsis Labs: @LABRCNTIP (procalcitonin:4,lacticidven:4)  ) Recent Results (from the past 240 hour(s))  Urine culture     Status: Abnormal   Collection Time: 06/17/2019  3:37 PM   Specimen: Urine, Random  Result Value Ref Range Status   Specimen Description   Final    URINE, RANDOM Performed at [facility], [place] [address], [place], [place] [address]    Special Requests   Final    NONE Performed at [facility], [place] [address], [place], [place] [address]    Culture MULTIPLE SPECIES PRESENT, SUGGEST RECOLLECTION (A)  Final   Report Status 06/24/2019 FINAL  Final  Culture, blood (routine x 2)     Status: Abnormal   Collection Time: 06/19/2019  4:56 PM   Specimen: BLOOD  Result Value Ref Range Status   Specimen Description   Final    BLOOD RIGHT ANTECUBITAL Performed at [place] [address] [place], [place] [address]    Special Requests    Final    BOTTLES DRAWN AEROBIC ONLY Blood Culture results may  not be optimal due to an excessive volume of blood received in culture bottles Performed at [facility] [address] [place], [place] [address]    Culture  Setup Time   Final    AEROBIC BOTTLE ONLY GRAM POSITIVE COCCI IN CHAINS CRITICAL RESULT CALLED TO, READ BACK BY AND VERIFIED WITH: Lavell Luster [facility] 07/09/2019 0032 JDW Performed at [facility], [place] [address] [place], [place] [address]    Culture ENTEROCOCCUS FAECALIS (A)  Final   Report Status 06/26/2019 FINAL  Final   Organism ID, Bacteria ENTEROCOCCUS FAECALIS  Final      Susceptibility   Enterococcus faecalis - MIC*    AMPICILLIN <=2 SENSITIVE Sensitive     VANCOMYCIN 1 SENSITIVE Sensitive     GENTAMICIN SYNERGY SENSITIVE Sensitive     * ENTEROCOCCUS FAECALIS  Blood Culture ID Panel (Reflexed)     Status: Abnormal   Collection Time: 06/24/2019  4:56 PM  Result Value Ref Range Status   Enterococcus species DETECTED (A) NOT DETECTED Final    Comment: CRITICAL RESULT CALLED TO, READ BACK BY AND VERIFIED WITH: J. GRIMSLEY PHARMD 06/26/2019 0032 JDW    Vancomycin resistance NOT DETECTED NOT DETECTED Final   Listeria monocytogenes NOT DETECTED NOT DETECTED Final   Staphylococcus species NOT DETECTED NOT DETECTED Final   Staphylococcus aureus (BCID) NOT DETECTED NOT DETECTED Final   Streptococcus species NOT DETECTED NOT DETECTED Final   Streptococcus agalactiae NOT DETECTED NOT DETECTED Final   Streptococcus pneumoniae NOT DETECTED NOT DETECTED Final   Streptococcus pyogenes NOT DETECTED NOT DETECTED Final   Acinetobacter baumannii NOT DETECTED NOT DETECTED Final   Enterobacteriaceae species NOT DETECTED NOT DETECTED Final   Enterobacter cloacae complex NOT DETECTED NOT DETECTED Final   Escherichia coli NOT DETECTED NOT DETECTED Final   Klebsiella oxytoca NOT DETECTED NOT DETECTED Final   Klebsiella pneumoniae NOT DETECTED NOT DETECTED  Final   Proteus species NOT DETECTED NOT DETECTED Final   Serratia marcescens NOT DETECTED NOT DETECTED Final   Haemophilus influenzae NOT DETECTED NOT DETECTED Final   Neisseria meningitidis NOT DETECTED NOT DETECTED Final   Pseudomonas aeruginosa NOT DETECTED NOT DETECTED Final   Candida albicans NOT DETECTED NOT DETECTED Final   Candida glabrata NOT DETECTED NOT DETECTED Final   Candida krusei NOT DETECTED NOT DETECTED Final   Candida parapsilosis NOT DETECTED NOT DETECTED Final   Candida tropicalis NOT DETECTED NOT DETECTED Final    Comment: Performed at [facility], [place] [address] [place], [place] [address]  Culture, blood (routine x 2)     Status: Abnormal   Collection Time: 07/02/2019  5:16 PM   Specimen: BLOOD  Result Value Ref Range Status   Specimen Description   Final    BLOOD RIGHT ANTECUBITAL Performed at [facility] [address] [place], [place] [address]    Special Requests   Final    BOTTLES DRAWN AEROBIC AND ANAEROBIC Blood Culture adequate volume Performed at [facility] [address] [place], [place] [address]    Culture  Setup Time   Final    GRAM POSITIVE COCCI IN CHAINS IN BOTH AEROBIC AND ANAEROBIC BOTTLES CRITICAL RESULT CALLED TO, READ BACK BY AND VERIFIED WITH: PHARMD M RENZ [id number] MLM    Culture (A)  Final    ENTEROCOCCUS FAECALIS SUSCEPTIBILITIES PERFORMED ON PREVIOUS CULTURE WITHIN THE LAST 5 DAYS. Performed at [facility], [place] [address], [place], [place]  [address]    Report Status 06/26/2019 FINAL  Final  SARS CORONAVIRUS 2 (TAT 6-24 HRS) Nasopharyngeal Nasopharyngeal Swab     Status: None   Collection Time: 07/04/2019  5:20 PM   Specimen: Nasopharyngeal Swab  Result Value Ref Range Status   SARS Coronavirus 2 NEGATIVE NEGATIVE Final    Comment: (NOTE) SARS-CoV-2 target nucleic acids are NOT DETECTED. The SARS-CoV-2 RNA is generally detectable in upper and lower respiratory  specimens during the acute phase of infection. Negative results do not preclude SARS-CoV-2 infection, do not rule out co-infections with other pathogens, and should not be used as the sole basis for treatment or other patient management decisions. Negative results must be combined with clinical observations, patient history, and epidemiological information. The expected result is Negative. Fact Sheet for Patients: SugarRoll.be Fact Sheet for Healthcare Providers: https://www.woods-mathews.com/ This test is not yet approved or cleared by the [place] FDA and  has been authorized for detection and/or diagnosis of SARS-CoV-2 by FDA under an Emergency Use Authorization (EUA). This EUA will remain  in effect (meaning this test can be used) for the duration of the COVID-19 declaration under Section 56 4(b)(1) of the Act, 21 U.S.C. section 360bbb-3(b)(1), unless the authorization is terminated or revoked sooner. Performed at [facility], [place] [address] [place], [place] [address]   MRSA PCR Screening     Status: None   Collection Time: 06/24/19  1:08 AM   Specimen: Nasal Mucosa; Nasopharyngeal  Result Value Ref Range Status   MRSA by PCR NEGATIVE NEGATIVE Final    Comment:        The GeneXpert MRSA Assay (FDA approved for NASAL specimens only), is one component of a comprehensive MRSA colonization surveillance program. It is not intended to diagnose MRSA infection nor to guide or monitor treatment for MRSA infections. Performed at [facility], [place] [address], [place], [place] [address]       Studies: DG CHEST PORT 1 VIEW  Result Date: 06/27/2019 CLINICAL DATA:  Hypoxia EXAM: PORTABLE CHEST 1 VIEW COMPARISON:  07/07/2019 FINDINGS: Mild patchy right lower lung opacity, more conspicuous than on the prior, mild infection not excluded. Associated right basilar opacity, likely reflecting a combination of  atelectasis and small pleural effusion when correlating with prior CT. Left lung is essentially clear. Cardiomegaly. Multifocal/diffuse osseous metastases. IMPRESSION: Mild patchy right lung opacity, mild infection not excluded. Small right pleural effusion with mild right lower lobe atelectasis. Multifocal/diffuse osseous metastases. Electronically Signed   By: Julian Hy M.D.   On: 06/27/2019 08:17    Scheduled Meds: . Chlorhexidine Gluconate Cloth  6 each Topical Daily  . gabapentin  300 mg Oral TID  . heparin injection (subcutaneous)  5,000 Units Subcutaneous Q8H  . insulin aspart  0-9 Units Subcutaneous Q6H  . mouth rinse  15 mL Mouth Rinse BID  . metoprolol tartrate  12.5 mg Oral BID  . pantoprazole  40 mg Oral Q0600    Continuous Infusions: . sodium chloride Stopped (07/06/2019 1846)  . ampicillin (OMNIPEN) IV Stopped (06/27/19 0532)     LOS: 4 days     Kayleen Memos, MD [facility] Pager [phone]  If 7PM-7AM, please contact night-coverage [email or web address] Password [facility] 06/27/2019, 12:33 PM

## 2019-06-27 NOTE — Progress Notes (Signed)
   Patient Name: Jeffery [name] Date of Encounter: 06/27/2019, 1:23 PM    Subjective  Somnolent, arousable responds to questions Tachycardic, heart rate 120s to 130s Febrile   Objective  BP 120/71 (BP Location: Left Arm)   Pulse (!) 110   Temp 98.6 F (37 C) (Oral)   Resp (!) 25   Ht 5\' 9"  (1.753 m)   Wt 98.5 kg   SpO2 97%   BMI 32.07 kg/m      Assessment and Plan  59 year old male with stage IV prostate cancer, chronic kidney disease with sepsis Enterococcus faecalis bacteremia He continues to be febrile, tachycardic and lethargic Will hold off EGD and colonoscopy until hemodynamic status improves and bacteremia resolves  Continue IV antibiotics and supportive care Monitor hemoglobin and transfuse as needed if hemoglobin less than 7  Please consult GI back once patient's hemodynamic status improves to consider endoscopic evaluation for worsening anemia, heme positive stool and Enterococcus faecalis bacteremia.  Available if have any questions or concerns. Dr. Hilarie Fredrickson will assume inpatient GI care tomorrow  K. Denzil Magnuson , MD [phone]

## 2019-06-27 NOTE — Consult Note (Addendum)
Renal Service Consult Note [name]  Jeffery [name] 06/27/2019 Jeffery [name] Requesting Physician:  Jeffery [name]  Reason for Consult:  REnal failure HPI: The patient is a 59 y.o. year-old w/ hx of metastatic prostate cancer DM2 and HL admitted on 3/10 w/ hypoxia and ^HR, also dysuria, anemia and guiac +stools. Admit labs showed Hb 6, creat 3.5.   Pt admitted for GIB, anemia and AKI on CKD3.  CT showed bilat renal hydro and enlarging prostate mass.  Baseline creat was around 2.  Asked to see for renal faliure.   Pt sedated after narc injection, mother is here.     Date  Creat   eGFR  2018  2.5 >> 1.4  2020  1.80- 2.06  34- 41  Feb 2021 2.38   29  this admit 3.54 >> 3.20 today 3/11 > 3/14      Date  Imaging Findings   07/29/2016 CT abd Mild bilat fullness L and R collect system   06/26/18  [place]   R mild-mod, L minimal   10/12/18 CT abd Moderate bilat hydro   12/23/2018 CT abd Severe bilat hydronephrosis/ hydroureter   01/18/2019 [place]  bilat hydro, similar to recent CT   07/01/2019 CT abd  bilat severe hydro similar to 12/23/18 study      ROS  n/a     Past Medical History  Past Medical History:  Diagnosis Date  . Cancer ([place])    stage IV prostate cancer per patient  . Diabetes mellitus, new onset ([place]) 07/2016  . Enlarged prostate   . HLD (hyperlipidemia)    Past Surgical History  Past Surgical History:  Procedure Laterality Date  . I & D EXTREMITY Left 01/27/2019   Procedure: IRRIGATION AND DEBRIDEMENT EXTREMITY;  Surgeon: Jeffery Planas, [name];  Location: [place];  Service: Orthopedics;  Laterality: Left;  . KNEE ARTHROSCOPY Right   . ORCHIECTOMY Bilateral 10/12/2018   Procedure: ORCHIECTOMY;  Surgeon: Jeffery [name];  Location: [facility];  Service: Urology;  Laterality: Bilateral;  . TRANSURETHRAL RESECTION OF PROSTATE  10/12/2018   Procedure: CYSTOSCOPY WITH URETHRAL DILATION;  Surgeon: Jeffery [name];  Location: [facility];  Service: Urology;;   Family  History  Family History  Problem Relation Age of Onset  . Prostate cancer Father   . Heart disease Father   . Prostate cancer Paternal Grandfather    Social History  reports that he quit smoking about 6 years ago. His smoking use included cigarettes. He has a 63.00 pack-year smoking history. He has never used smokeless tobacco. He reports that he does not drink alcohol or use drugs. Allergies No Known Allergies Home medications Prior to Admission medications   Medication Sig Start Date End Date Taking? Authorizing Provider  Amino Acids-Protein Hydrolys (FEEDING SUPPLEMENT, PRO-STAT SUGAR FREE 64,) LIQD Take 30 mLs by mouth 3 (three) times daily. 10/14/18  Yes Jeffery [name], Jeffery Dickinson, [name]  feeding supplement, ENSURE ENLIVE, (ENSURE ENLIVE) LIQD Take 237 mLs by mouth daily. 10/14/18  Yes Jeffery [name], Jeffery Dickinson, [name]  finasteride (PROSCAR) 5 MG tablet Take 5 mg by mouth daily. 02/12/18  Yes Provider, Historical, [name]  furosemide (LASIX) 40 MG tablet Take 40 mg by mouth daily.   Yes Provider, Historical, [name]  gabapentin (NEURONTIN) 300 MG capsule Take 1-2 capsules (300-600 mg total) by mouth 3 (three) times daily. 352m in am and 6076min evening Patient taking differently: Take 600 mg by mouth 3 (three) times daily.  05/31/19  Yes Jeffery [name]  HYDROcodone-acetaminophen (NORCO/VICODIN) 5-325 MG tablet TAKE 1 TO 2 TABLETS BY MOUTH EVERY 4 TO 6 HOURS AS NEEDED FOR PAIN (MAX OF 8 TABLETS IN 24 HOURS) Patient taking differently: Take 1-2 tablets by mouth every 4 (four) hours as needed for moderate pain or severe pain. TAKE 1 TO 2 TABLETS BY MOUTH EVERY 4 TO 6 HOURS AS NEEDED FOR PAIN (MAX OF 8 TABLETS IN 24 HOURS) 06/15/19  Yes Jeffery [name]  metFORMIN (GLUCOPHAGE-XR) 500 MG 24 hr tablet Take 500-1,000 mg by mouth 2 (two) times daily. Take 2 tablets (1000 mg) in the am and Take 1 tablet at bedtime 01/12/19  Yes Provider, Historical, [name]  Multiple Vitamin (MULTIVITAMIN WITH MINERALS) TABS tablet Take 1 tablet by  mouth daily. 10/15/18  Yes Jeffery [name], Jeffery Dickinson, [name]  predniSONE (DELTASONE) 5 MG tablet Take 1 tablet (5 mg total) by mouth daily with breakfast. 11/03/18  Yes Shadad, Jeffery Dad, [name]  tamsulosin (FLOMAX) 0.4 MG CAPS capsule Take 2 capsules (0.8 mg total) by mouth daily after supper. 10/14/18  Yes Jeffery [name], Jeffery Dickinson, [name]  vitamin B-12 (CYANOCOBALAMIN) 1000 MCG tablet Take 1 tablet (1,000 mcg total) by mouth daily. 10/14/18  Yes Jeffery [name], Jeffery Dickinson, [name]  ZYTIGA 250 MG tablet TAKE 4 TABLETS BY MOUTH ONCE DAILY AS DIRECTED.  TAKE 1 HOUR BEFORE OR 2 HOURS AFTER A MEAL Patient taking differently: Take 1,000 mg by mouth daily.  12/18/18  Yes Jeffery [name]  Blood Glucose Monitoring Suppl (RELION CONFIRM GLUCOSE MONITOR) w/Device KIT Use 4 times daily before meals and bedtime as directed. 07/31/16   Jeffery [name]  glucose blood (RELION GLUCOSE TEST STRIPS) test strip Use as instructed 07/31/16   Jeffery [name]  Lancets 30G MISC Use 4 times daily before meals as directed. 07/31/16   Jeffery [name]     Vitals:   06/27/19 1217 06/27/19 1228 06/27/19 1300 06/27/19 1400  BP: 120/71  125/76 131/70  Pulse: (!) 110  (!) 125 (!) 130  Resp: (!) 25  (!) 22 20  Temp:  98.6 F (37 C)    TempSrc:  Oral    SpO2: 97%  98% 96%  Weight:      Height:       Exam Gen drowsy, disheveled, not in distress, lying at 30deg No rash, cyanosis or gangrene Sclera anicteric, throat slightly dry  No jvd or bruits Chest clear bilat to bases RRR no MRG Abd soft ntnd no mass or ascites +bs GU normal male MS no joint effusions or deformity Ext 2+ bilat hip edema, no other edema Neuro is drowsy, nonfocal, looks weak in general    Home meds:  - furosemide 40 qd  - proscar 5 qd/ tamsulosin 0.8 qd  - gabapentin 600 tid/ hydrocodone- aceta prn  - metformin 1000 am+ 500 pm  - prn's/ vitamins/ supplements    UA 3/10  -- > 50 wbc, many bact, 6- 10 rbcs, 30 prot   CT stone study no contrast 3/10 > Adrenals/Urinary Tract: Severe  hydronephrosis is noted bilaterally. This extends to the level of the urinary bladder. The bladder is partially distended. Considerable in growth of the known prostate carcinoma is noted inferiorly contributing to the hydronephrosis. Bladder wall thickening is noted likely related to a degree of bladder outlet obstruction.     Assessment/ Plan: 1. AoCKD 3 - in pt w/ chronic > 1 year hx of progressive bilat hydro felt to be due  to local prostate cancer spread w/ bilat ureteral obstruction. Pt has not agreed to recommended bilat perc nephrostomy in the past. Creat was up in 2018 at 1.4, and 1.8- 2.0 in 2020. Now creat 3.0- 3.5. get urine lytes.  Question is would bilat perc neph tubes help? He likely has some perm renal damage. Placing tubes could help his renal function.  Not sure it will improve his QOL w/ the progressive metastatic prostate cancer. Palliative care will be meeting w/ pt and family here soon and will try to help them to decide how aggressive to be.  2. BP/ volume - BP's okay, no BP meds here or at home, bp's wnl. Has LE dependent edema, no resp issues.  Has been getting IVF's for several days but UOP has been large and I/O's en total are even. However today's CXR shows new pulm edema, so IVF's stopped and IV lasix given 87m.  Will cont tid for now. Keep foley in for now.  3. Bacteremia - enterococcus, presumably urinary source, on IV abx, fevers improving. Still quite tachy.  4. Prostate cancer - mets to bones, ureteral obstruction 5. DM2 - recent onset      Rob Zoe Creasman  [name] 06/27/2019, 2:43 PM  Recent Labs  Lab 06/26/19 0210 06/26/19 0210 06/26/19 0621 06/27/19 0317  WBC 9.4  --   --  9.0  HGB 7.0*   < > 7.2* 7.6*   < > = values in this interval not displayed.   Recent Labs  Lab 07/12/2019 1714 06/19/2019 1714 06/24/19 0858 06/24/19 0858 06/17/2019 0216 07/04/2019 0216 06/26/19 0210 06/26/19 0210 06/26/19 0621 06/27/19 0317  K 4.6   < > 4.4   < > 4.2   < > 3.2*   < >  3.5 3.6  BUN 59*   < > 61*   < > 57*   < > 44*  --   --  41*  CREATININE 3.54*   < > 3.79*   < > 3.82*   < > 3.32*  --   --  3.20*  CALCIUM 8.7*  --  8.2*  --  8.6*  --  9.0  --   --  9.4  PHOS  --   --   --   --  5.7*  --   --   --   --   --    < > = values in this interval not displayed.

## 2019-06-28 ENCOUNTER — Inpatient Hospital Stay (HOSPITAL_COMMUNITY): Payer: Medicaid Other

## 2019-06-28 DIAGNOSIS — D649 Anemia, unspecified: Secondary | ICD-10-CM

## 2019-06-28 DIAGNOSIS — R52 Pain, unspecified: Secondary | ICD-10-CM

## 2019-06-28 DIAGNOSIS — M7989 Other specified soft tissue disorders: Secondary | ICD-10-CM

## 2019-06-28 DIAGNOSIS — R609 Edema, unspecified: Secondary | ICD-10-CM

## 2019-06-28 DIAGNOSIS — B952 Enterococcus as the cause of diseases classified elsewhere: Secondary | ICD-10-CM

## 2019-06-28 LAB — BASIC METABOLIC PANEL
Anion gap: 13 (ref 5–15)
BUN: 39 mg/dL — ABNORMAL HIGH (ref 6–20)
CO2: 22 mmol/L (ref 22–32)
Calcium: 9.1 mg/dL (ref 8.9–10.3)
Chloride: 110 mmol/L (ref 98–111)
Creatinine, Ser: 3.36 mg/dL — ABNORMAL HIGH (ref 0.61–1.24)
GFR calc Af Amer: 22 mL/min — ABNORMAL LOW (ref >60)
GFR calc non Af Amer: 19 mL/min — ABNORMAL LOW (ref >60)
Glucose, Bld: 124 mg/dL — ABNORMAL HIGH (ref 70–99)
Potassium: 3.6 mmol/L (ref 3.5–5.1)
Sodium: 145 mmol/L (ref 135–145)

## 2019-06-28 LAB — CBC WITH DIFFERENTIAL/PLATELET
Abs Immature Granulocytes: 0.38 10*3/uL — ABNORMAL HIGH (ref 0.00–0.07)
Basophils Absolute: 0 10*3/uL (ref 0.0–0.1)
Basophils Relative: 0 %
Eosinophils Absolute: 0.1 10*3/uL (ref 0.0–0.5)
Eosinophils Relative: 2 %
HCT: 31.1 % — ABNORMAL LOW (ref 39.0–52.0)
Hemoglobin: 8.8 g/dL — ABNORMAL LOW (ref 13.0–17.0)
Immature Granulocytes: 4 %
Lymphocytes Relative: 18 %
Lymphs Abs: 1.6 10*3/uL (ref 0.7–4.0)
MCH: 25.8 pg — ABNORMAL LOW (ref 26.0–34.0)
MCHC: 28.3 g/dL — ABNORMAL LOW (ref 30.0–36.0)
MCV: 91.2 fL (ref 80.0–100.0)
Monocytes Absolute: 0.6 10*3/uL (ref 0.1–1.0)
Monocytes Relative: 6 %
Neutro Abs: 6.6 10*3/uL (ref 1.7–7.7)
Neutrophils Relative %: 70 %
Platelets: 229 10*3/uL (ref 150–400)
RBC: 3.41 MIL/uL — ABNORMAL LOW (ref 4.22–5.81)
RDW: 18.3 % — ABNORMAL HIGH (ref 11.5–15.5)
WBC: 9.3 10*3/uL (ref 4.0–10.5)
nRBC: 0.3 % — ABNORMAL HIGH (ref 0.0–0.2)

## 2019-06-28 LAB — GLUCOSE, CAPILLARY
Glucose-Capillary: 128 mg/dL — ABNORMAL HIGH (ref 70–99)
Glucose-Capillary: 125 mg/dL — ABNORMAL HIGH (ref 70–99)
Glucose-Capillary: 121 mg/dL — ABNORMAL HIGH (ref 70–99)
Glucose-Capillary: 141 mg/dL — ABNORMAL HIGH (ref 70–99)
Glucose-Capillary: 196 mg/dL — ABNORMAL HIGH (ref 70–99)

## 2019-06-28 LAB — MAGNESIUM: Magnesium: 2 mg/dL (ref 1.7–2.4)

## 2019-06-28 MED ORDER — GABAPENTIN 100 MG PO CAPS: 200.0000 mg | ORAL_CAPSULE | Freq: Two times a day (BID) | ORAL | Status: DC

## 2019-06-28 MED ORDER — DIGOXIN 0.25 MG/ML IJ SOLN
0.2500 mg | Freq: Every day | INTRAMUSCULAR | Status: AC
  Administered 2019-06-28: 0.25 mg via INTRAVENOUS
  Filled 2019-06-28: qty 2

## 2019-06-28 MED ORDER — ENSURE ENLIVE PO LIQD
237.0000 mL | Freq: Two times a day (BID) | ORAL | Status: DC
  Administered 2019-06-28 – 2019-06-29 (×2): 237 mL via ORAL

## 2019-06-28 MED ORDER — DIGOXIN 0.25 MG/ML IJ SOLN: 0.2500 mg | Freq: Every day | INTRAMUSCULAR | Status: DC

## 2019-06-28 NOTE — Progress Notes (Signed)
Admit: 07/05/2019 LOS: 5  77M metastatic prostate cancer, DM2, admitted with symptomatic anemia likely GI source; AoCKD3 with progressive bilateral hydronephrosis with enlarging prostate mass  Subjective:  . 6.6L UOP yesterday;   . SCr 3.1 to 3.36 . K 3.6 and HCO3 22, Na 145 . Pt has declined b/l PCNs to date . Enterococcal bacteremia and UTI  03/14 0701 - 03/15 0700 In: 960.7 [P.O.:250; Blood:315; IV Piggyback:395.7] Out: 6650 [Urine:6650]  Filed Weights   06/24/2019 1259 06/28/19 0500 06/28/19 0515  Weight: 98.5 kg 99.8 kg 99.8 kg    Scheduled Meds: . Chlorhexidine Gluconate Cloth  6 each Topical Daily  . gabapentin  300 mg Oral TID  . insulin aspart  0-9 Units Subcutaneous Q6H  . mouth rinse  15 mL Mouth Rinse BID  . metoprolol tartrate  12.5 mg Oral BID  . pantoprazole  40 mg Oral Q0600   Continuous Infusions: . ampicillin (OMNIPEN) IV Stopped (06/28/19 0558)  . cefTRIAXone (ROCEPHIN)  IV Stopped (06/27/19 1839)   PRN Meds:.acetaminophen **OR** acetaminophen, fentaNYL (SUBLIMAZE) injection, metoprolol tartrate, ondansetron **OR** ondansetron (ZOFRAN) IV, oxyCODONE, sodium chloride  Current Labs: reviewed    Physical Exam:  Blood pressure 105/65, pulse (!) 132, temperature (!) 100.4 F (38 C), temperature source Axillary, resp. rate 19, height 5\' 9"  (1.753 m), weight 99.8 kg, SpO2 96 %. Chronically ill-appearing, NAD, conversant Tachycardic, regular, no rub Clear bilaterally Trace edema bilaterally Soft nontender, no suprapubic fullness  A 1. AoCKD3: obstructive from progressive metastatic prostate cancer with hydronephrosis; excellent urine output after placement of Foley catheter; will need long-term relief of obstruction, per urology, but suspect that bilateral percutaneous nephrostomy tubes would be best which patient has declined to date; sees [facility] at [facility] 2. Enterococcal UTI and bacteremia with sepsis, per TRH and ID; ampicillin and ceftriaxone 3. Metastatic  prostate cancer, progressive 4. Anemia, possible GI source, GI following transfuse, hemoglobin 8.8 this morning 5. DM2  P . Would hold diuretics moving forward . Will need goals of care established and if aggressive consideration of PCNs . Cont supportive care . Daily weights, Daily Renal Panel, Strict I/Os, Avoid nephrotoxins (NSAIDs, judicious IV Contrast)    Pearson Grippe MD 06/28/2019, 1:23 PM  Recent Labs  Lab 07/11/2019 0216 06/26/19 0210 06/27/19 0317 06/27/19 1716 06/28/19 0316  NA 154*   < > 148* 146* 145  K 4.2   < > 3.6 3.7 3.6  CL 119*   < > 113* 112* 110  CO2 22   < > 24 23 22   GLUCOSE 113*   < > 128* 136* 124*  BUN 57*   < > 41* 37* 39*  CREATININE 3.82*   < > 3.20* 3.14* 3.36*  CALCIUM 8.6*   < > 9.4 9.2 9.1  PHOS 5.7*  --   --   --   --    < > = values in this interval not displayed.   Recent Labs  Lab 06/26/19 0210 06/26/19 0621 06/27/19 0317 06/27/19 1930 06/28/19 0316  WBC 9.4  --  9.0  --  9.3  NEUTROABS 7.4  --  6.8  --  6.6  HGB 7.0*   < > 7.6* 7.1* 8.8*  HCT 25.0*   < > 27.7* 25.1* 31.1*  MCV 90.6  --  90.8  --  91.2  PLT 228  --  225  --  229   < > = values in this interval not displayed.

## 2019-06-28 NOTE — Progress Notes (Signed)
PROGRESS NOTE  Jeffery [name] DJS:[id number] DOB: 1961/04/06 DOA: 07/03/2019 PCP: [name], MD  HPI/Recap of past 24 hours: Patient is a 59 year old gentleman with stage IV prostate cancer, type II diabetes, hyperlipidemia, chronic kidney disease stage IIIB with baseline creatinine about 2 who presented to the emergency room with generalized weakness, hypoxia and tachycardia, painful urination.  Patient also has chronic anemia and chronic guaiac positive stools.  He was working with therapies at home, he was found shaky and tremulous, oncology office was called who directed him to the ER.  Febrile tachycardic in the ED.  Hemoglobin 6.4.  Transfused 2 unit PRBCs.  Creatinine up to 3.54.   A CT scan shows invasive prostate cancer into the bladder with severe bilateral hydronephrosis.  A Foley catheter was inserted.  TRH was asked to admit.  Intermittent high grade fevers, suspected hematuria with Hg drop down to 7.1.  Transfused another 1U PRBC on 3/14, Hg up to 8.8 post RBC transfusion.  Received IV lasix due to volume overload with acute hypoxia.  Responded well with improvement of hypoxia. IV lasix dced on 3/15 due to rise in creatinine and soft Bps.  06/28/19:  Seen and examined.  Drowsy this AM.  Answering questions appropriately.  States he feels tired.   Assessment/Plan: Principal Problem:   Severe sepsis (HCC) Active Problems:   Urinary retention   AKI (acute kidney injury) ([place])   Cholelithiasis   Diabetes mellitus, new onset ([place])   DM2 (diabetes mellitus, type 2) (HCC)   CKD (chronic kidney disease) stage 3, GFR 30-59 ml/min   Prostate cancer (HCC)   GI bleed   Heme positive stool   Anemia due to chronic blood loss  Sepsis, present on admission suspect from urinary source, Enterococcus bacteremia. Intermittent high-grade fevers with T-max 102.1 this morning Tachycardic with rates between 115 and 130s Maintain MAP greater than 65 IV fluid stopped due to  suspected pulmonary edema on CXR and hypoxia Blood cultures x2 grew Enterococcus faecalis, sensitive to ampicillin Repeated blood cultures negative to date, continue to follow cultures Ucx multiple species present, suggesting recollection  Enterococcus bacteremia 2/2 bottles Management as per above Procalcitonin is 8.4 Repeated blood cultures on 06/26/2019 negative to date.  Continue to follow cultures TTE shows no evidence of endocarditis Colonoscopy planned once hemodynamically stable Infectious disease has been consulted and following.  Acute hypoxic respiratory failure secondary to suspected pulmonary edema versus CAP Not on oxygen supplementation at baseline O2 saturation 96% on 2 L after receiving IV diuretics Continue to maintain O2 saturation greater than 92%  Suspected CAP Independently reviewed CXR showing increase in pulmonary vascularity with RLL opacity.  CAP could not be excluded Continue Rocephin empirically Monitor fever curve and WBC  Severe protein calorie malnutrition Albumin 1.7 BMI 32 with loss of muscle mass Poor oral intake, encourage increase in protein calorie intake Dietitian consult  Refractory sinus tachycardia likely multifactorial 2/2 to sepsis versus fever versus other ST on 12 lead EKG Fever with Tmax 102.1 this morning Continue po lopressor 12.5 mg BID IV lopressor 2.5 mg q4H prn with HR>130 when MAP>70  Resolved prolonged QTC 12 lead EKG on 3/12 QTC >540 Repeated 12 lead EKG done on 3/14 QTc 353  Left lower extremity pain, DVT ruled out. Chronic left lower extremity weakness with worsening pain Bilateral Doppler ultrasound negative for DVT.  Acute kidney injury on chronic kidney disease stage IIIB likely postrenal versus prerenal 2/2 to obstructive uropathy from prostate cancer versus intravascular  volume depletion:  Baseline creatinine appears to be 2.0 with GFR of 36 Presented with creatinine up to 3.8 Creatinine slowly trending up to  3.3. IV Lasix held. Continue to avoid nephrotoxins and hypotension Nephrology following, appreciate assistance. Continue Foley catheter #4 6.6 L urine output recorded in the last 24-hour Net I&O -6.4 L.  Resolved Hypokalemia K+ 3.2, repeated 3.5>> 3.6.  Anemia of chronic disease, positive FOBT and hematuria:  Received 2 units of PRBC.  Hg 7.2>>7.6> 7.1 Transfused 1 unit PRBC on 06/27/2019 Hemoglobin 8.8 Continue PPI Continue to monitor H&H.  Resolved hypervolemic Hypernatremia:  Encourage free water oral intake instead of IV fluids for now  Type 2 diabetes: Controlled.  A1C 6.6 on 06/24/19.  On Metformin at home.  Continue sliding scale.  Metastatic prostate cancer on Zytiga, bladder obstruction, debility and chronic anemia: Followed by his oncologist.  Will have outpatient follow-up after stabilization. Zytiga on hold  Goals of care Palliative care following  DVT prophylaxis: DVT prophylaxis with SCDs Code Status: Full code  Family Communication:  Updated the patient's mother via phone on 06/27/2019.  Disposition Plan: patient is from home. Anticipated DC to home, Barriers to discharge active treatment, IV antibiotics, inpatient procedures.   Consultants:   Urology  GI  Oncology  Palliative medicine  Nephrology  Dietitian  Procedures:   Foley cath insertion     Objective: Vitals:   06/28/19 0515 06/28/19 0600 06/28/19 0644 06/28/19 1000  BP:  107/68  105/65  Pulse:  (!) 127  (!) 132  Resp:  (!) 34  19  Temp:   (!) 101.1 F (38.4 C) 98.1 F (36.7 C)  TempSrc:   Axillary Axillary  SpO2:  97%  96%  Weight: 99.8 kg     Height:        Intake/Output Summary (Last 24 hours) at 06/28/2019 1127 Last data filed at 06/28/2019 0700 Gross per 24 hour  Intake 960.65 ml  Output 5950 ml  Net -4989.35 ml   Filed Weights   06/24/2019 1259 06/28/19 0500 06/28/19 0515  Weight: 98.5 kg 99.8 kg 99.8 kg    Exam:  . General: 59 y.o. year-old male  well-developed well-nourished in no acute distress.  Somnolent and minimally interactive.   . Cardiovascular: Tachycardic no rubs or gallops. [name] Respiratory: Clear to auscultation no wheezes noted.  Poor inspiratory effort. .  Abdomen: Soft nontender normal bowel sounds present.   . Musculoskeletal: Trace lower extremity edema bilaterally.   [name] Psychiatry: Unable to assess mood due to somnolence.   Data Reviewed: CBC: Recent Labs  Lab 07/04/2019 1714 06/24/19 0112 06/24/19 9937 06/24/19 1696 06/19/2019 0216 06/28/2019 0216 06/26/19 0210 06/26/19 7893 06/27/19 0317 06/27/19 1930 06/28/19 0316  WBC 11.8*   < > 10.2  --  9.7  --  9.4  --  9.0  --  9.3  NEUTROABS 9.3*  --   --   --   --   --  7.4  --  6.8  --  6.6  HGB 6.4*   < > 7.5*   < > 7.4*   < > 7.0* 7.2* 7.6* 7.1* 8.8*  HCT 22.6*   < > 26.0*   < > 26.4*   < > 25.0* 25.0* 27.7* 25.1* 31.1*  MCV 88.6   < > 89.3  --  92.6  --  90.6  --  90.8  --  91.2  PLT 271   < > 336  --  249  --  228  --  225  --  229   < > = values in this interval not displayed.   Basic Metabolic Panel: Recent Labs  Lab 07/13/2019 0216 06/24/2019 0216 06/26/19 0210 06/26/19 4174 06/27/19 0317 06/27/19 1716 06/28/19 0316  NA 154*  --  145  --  148* 146* 145  K 4.2   < > 3.2* 3.5 3.6 3.7 3.6  CL 119*  --  113*  --  113* 112* 110  CO2 22  --  21*  --  24 23 22   GLUCOSE 113*  --  151*  --  128* 136* 124*  BUN 57*  --  44*  --  41* 37* 39*  CREATININE 3.82*  --  3.32*  --  3.20* 3.14* 3.36*  CALCIUM 8.6*  --  9.0  --  9.4 9.2 9.1  MG 2.1  --  1.9  --   --   --  2.0  PHOS 5.7*  --   --   --   --   --   --    < > = values in this interval not displayed.   GFR: Estimated Creatinine Clearance: 28.6 mL/min (A) (by C-G formula based on SCr of 3.36 mg/dL (H)). Liver Function Tests: Recent Labs  Lab 06/22/2019 1714 06/24/19 0858 06/15/2019 0216 06/26/19 0210 06/27/19 0317  AST 33 28 29 42* 40  ALT 28 23 22 26 26   ALKPHOS 224* 186* 158* 144* 158*  BILITOT  0.8 1.0 0.5 0.5 0.8  PROT 7.3 6.5 6.1* 6.0* 5.9*  ALBUMIN 2.2* 2.0* 2.0* 1.6* 1.7*   No results for input(s): LIPASE, AMYLASE in the last 168 hours. Recent Labs  Lab 06/16/2019 1714  AMMONIA 14   Coagulation Profile: No results for input(s): INR, PROTIME in the last 168 hours. Cardiac Enzymes: No results for input(s): CKTOTAL, CKMB, CKMBINDEX, TROPONINI in the last 168 hours. BNP (last 3 results) No results for input(s): PROBNP in the last 8760 hours. HbA1C: No results for input(s): HGBA1C in the last 72 hours. CBG: Recent Labs  Lab 06/27/19 0706 06/27/19 1212 06/27/19 1737 06/27/19 2318 06/28/19 0517  GLUCAP 128* 141* 147* 156* 125*   Lipid Profile: No results for input(s): CHOL, HDL, LDLCALC, TRIG, CHOLHDL, LDLDIRECT in the last 72 hours. Thyroid Function Tests: No results for input(s): TSH, T4TOTAL, FREET4, T3FREE, THYROIDAB in the last 72 hours. Anemia Panel: No results for input(s): VITAMINB12, FOLATE, FERRITIN, TIBC, IRON, RETICCTPCT in the last 72 hours. Urine analysis:    Component Value Date/Time   COLORURINE YELLOW 06/29/2019 1537   APPEARANCEUR CLEAR 07/06/2019 1537   LABSPEC 1.006 06/25/2019 1537   PHURINE 6.0 06/26/2019 1537   GLUCOSEU NEGATIVE 07/09/2019 1537   HGBUR MODERATE (A) 07/07/2019 1537   BILIRUBINUR NEGATIVE 07/04/2019 1537   KETONESUR NEGATIVE 07/11/2019 1537   PROTEINUR 30 (A) 07/07/2019 1537   NITRITE NEGATIVE 07/07/2019 1537   LEUKOCYTESUR LARGE (A) 07/11/2019 1537   Sepsis Labs: @LABRCNTIP (procalcitonin:4,lacticidven:4)  ) Recent Results (from the past 240 hour(s))  Urine culture     Status: Abnormal   Collection Time: 06/19/2019  3:37 PM   Specimen: Urine, Random  Result Value Ref Range Status   Specimen Description   Final    URINE, RANDOM Performed at [facility], [place] [address], [place], [place] [address]    Special Requests   Final    NONE Performed at [facility], [place] [address], [place], [place] [address]    Culture MULTIPLE SPECIES PRESENT, SUGGEST  RECOLLECTION (A)  Final   Report Status 06/24/2019 FINAL  Final  Culture, blood (routine x 2)     Status: Abnormal   Collection Time: 06/22/2019  4:56 PM   Specimen: BLOOD  Result Value Ref Range Status   Specimen Description   Final    BLOOD RIGHT ANTECUBITAL Performed at [facility] [address] [place], [place] [address]    Special Requests   Final    BOTTLES DRAWN AEROBIC ONLY Blood Culture results may not be optimal due to an excessive volume of blood received in culture bottles Performed at [facility] [address], [place], [place] [address]    Culture  Setup Time   Final    AEROBIC BOTTLE ONLY GRAM POSITIVE COCCI IN CHAINS CRITICAL RESULT CALLED TO, READ BACK BY AND VERIFIED WITH: Lavell Luster [facility] 07/04/2019 0032 JDW Performed at [facility], [place] [address], [place], [place] [address]    Culture ENTEROCOCCUS FAECALIS (A)  Final   Report Status 06/26/2019 FINAL  Final   Organism ID, Bacteria ENTEROCOCCUS FAECALIS  Final      Susceptibility   Enterococcus faecalis - MIC*    AMPICILLIN <=2 SENSITIVE Sensitive     VANCOMYCIN 1 SENSITIVE Sensitive     GENTAMICIN SYNERGY SENSITIVE Sensitive     * ENTEROCOCCUS FAECALIS  Blood Culture ID Panel (Reflexed)     Status: Abnormal   Collection Time: 06/21/2019  4:56 PM  Result Value Ref Range Status   Enterococcus species DETECTED (A) NOT DETECTED Final    Comment: CRITICAL RESULT CALLED TO, READ BACK BY AND VERIFIED WITH: J. GRIMSLEY PHARMD 07/03/2019 0032 JDW    Vancomycin resistance NOT DETECTED NOT DETECTED Final   Listeria monocytogenes NOT DETECTED NOT DETECTED Final   Staphylococcus species NOT DETECTED NOT DETECTED Final   Staphylococcus aureus (BCID) NOT DETECTED NOT DETECTED Final   Streptococcus species NOT DETECTED NOT DETECTED Final   Streptococcus agalactiae NOT DETECTED NOT DETECTED Final    Streptococcus pneumoniae NOT DETECTED NOT DETECTED Final   Streptococcus pyogenes NOT DETECTED NOT DETECTED Final   Acinetobacter baumannii NOT DETECTED NOT DETECTED Final   Enterobacteriaceae species NOT DETECTED NOT DETECTED Final   Enterobacter cloacae complex NOT DETECTED NOT DETECTED Final   Escherichia coli NOT DETECTED NOT DETECTED Final   Klebsiella oxytoca NOT DETECTED NOT DETECTED Final   Klebsiella pneumoniae NOT DETECTED NOT DETECTED Final   Proteus species NOT DETECTED NOT DETECTED Final   Serratia marcescens NOT DETECTED NOT DETECTED Final   Haemophilus influenzae NOT DETECTED NOT DETECTED Final   Neisseria meningitidis NOT DETECTED NOT DETECTED Final   Pseudomonas aeruginosa NOT DETECTED NOT DETECTED Final   Candida albicans NOT DETECTED NOT DETECTED Final   Candida glabrata NOT DETECTED NOT DETECTED Final   Candida krusei NOT DETECTED NOT DETECTED Final   Candida parapsilosis NOT DETECTED NOT DETECTED Final   Candida tropicalis NOT DETECTED NOT DETECTED Final    Comment: Performed at [facility], [place] [address] [place], [place] [address]  Culture, blood (routine x 2)     Status: Abnormal   Collection Time: 06/24/2019  5:16 PM   Specimen: BLOOD  Result Value Ref Range Status   Specimen Description   Final    BLOOD RIGHT ANTECUBITAL Performed at [facility] [address], [place], [place] [address]    Special Requests   Final    BOTTLES DRAWN AEROBIC AND ANAEROBIC Blood Culture adequate volume  Performed at [facility], [place] [address], [place], [place] [address]    Culture  Setup Time   Final    GRAM POSITIVE COCCI IN CHAINS IN BOTH AEROBIC AND ANAEROBIC BOTTLES CRITICAL RESULT CALLED TO, READ BACK BY AND VERIFIED WITH: PHARMD M RENZ [id number] [phone]    Culture (A)  Final    ENTEROCOCCUS FAECALIS SUSCEPTIBILITIES PERFORMED ON PREVIOUS CULTURE WITHIN THE LAST 5 DAYS. Performed at [facility], [place] [address], [place], [place] [address]    Report Status 06/26/2019 FINAL  Final  SARS CORONAVIRUS 2 (TAT 6-24 HRS) Nasopharyngeal Nasopharyngeal Swab     Status: None   Collection Time: 07/12/2019  5:20 PM   Specimen: Nasopharyngeal Swab  Result Value Ref Range Status   SARS Coronavirus 2 NEGATIVE NEGATIVE Final    Comment: (NOTE) SARS-CoV-2 target nucleic acids are NOT DETECTED. The SARS-CoV-2 RNA is generally detectable in upper and lower respiratory specimens during the acute phase of infection. Negative results do not preclude SARS-CoV-2 infection, do not rule out co-infections with other pathogens, and should not be used as the sole basis for treatment or other patient management decisions. Negative results must be combined with clinical observations, patient history, and epidemiological information. The expected result is Negative. Fact Sheet for Patients: SugarRoll.be Fact Sheet for Healthcare Providers: https://www.woods-mathews.com/ This test is not yet approved or cleared by the [place] FDA and  has been authorized for detection and/or diagnosis of SARS-CoV-2 by FDA under an Emergency Use Authorization (EUA). This EUA will remain  in effect (meaning this test can be used) for the duration of the COVID-19 declaration under Section 56 4(b)(1) of the Act, 21 U.S.C. section 360bbb-3(b)(1), unless the authorization is terminated or revoked sooner. Performed at [facility], [place] [address], [place], [place] [address]   MRSA PCR Screening     Status: None   Collection Time: 06/24/19  1:08 AM   Specimen: Nasal Mucosa; Nasopharyngeal  Result Value Ref Range Status   MRSA by PCR NEGATIVE NEGATIVE Final    Comment:        The GeneXpert MRSA Assay (FDA approved for NASAL specimens only), is one component of a comprehensive MRSA colonization surveillance program. It is not intended to diagnose MRSA infection nor to guide or monitor  treatment for MRSA infections. Performed at [facility], [place] [address], [place], [place] [address]   Culture, blood (routine x 2)     Status: None (Preliminary result)   Collection Time: 06/26/19  2:10 AM   Specimen: BLOOD  Result Value Ref Range Status   Specimen Description BLOOD RIGHT ARM  Final   Special Requests   Final    BOTTLES DRAWN AEROBIC ONLY Blood Culture adequate volume Performed at [facility] [address] [place], [place] [address]    Culture NO GROWTH 2 DAYS  Final   Report Status PENDING  Incomplete  Culture, blood (routine x 2)     Status: None (Preliminary result)   Collection Time: 06/26/19  2:10 AM   Specimen: BLOOD  Result Value Ref Range Status   Specimen Description BLOOD RIGHT HAND  Final   Special Requests   Final    BOTTLES DRAWN AEROBIC ONLY Blood Culture adequate volume Performed at [place] [address] [place], [place] [address]    Culture NO GROWTH 2 DAYS  Final   Report Status PENDING  Incomplete  Studies: VAS [place] LOWER EXTREMITY VENOUS (DVT)  Result Date: 06/28/2019  Lower Venous DVTStudy Indications: Swelling, Edema, and Pain.  Risk Factors: Cancer prostate. Limitations: Poor ultrasound/tissue interface. Comparison Study: No prior exam. Performing Technologist: Baldwin Crown ARDMS, RVT  Examination Guidelines: A complete evaluation includes B-mode imaging, spectral Doppler, color Doppler, and power Doppler as needed of all accessible portions of each vessel. Bilateral testing is considered an integral part of a complete examination. Limited examinations for reoccurring indications may be performed as noted. The reflux portion of the exam is performed with the patient in reverse Trendelenburg.  +---------+---------------+---------+-----------+----------+------------------+ RIGHT    CompressibilityPhasicitySpontaneityPropertiesThrombus Aging      +---------+---------------+---------+-----------+----------+------------------+ CFV      Full           Yes      Yes                                     +---------+---------------+---------+-----------+----------+------------------+ SFJ      Full                                                            +---------+---------------+---------+-----------+----------+------------------+ FV Prox  Full                                                            +---------+---------------+---------+-----------+----------+------------------+ FV Mid   Full                                                            +---------+---------------+---------+-----------+----------+------------------+ FV DistalFull                                                            +---------+---------------+---------+-----------+----------+------------------+ PFV      Full                                                            +---------+---------------+---------+-----------+----------+------------------+ POP      Full           Yes      Yes                                     +---------+---------------+---------+-----------+----------+------------------+ PTV      Full  visualized with                                                          color              +---------+---------------+---------+-----------+----------+------------------+ PERO     Full                                         visualized with                                                          color              +---------+---------------+---------+-----------+----------+------------------+ Hypoechoic, oval structure with echogenic center visualized in right groin measuring 4.3 x 1.4 x 2.3.  +---------+---------------+---------+-----------+----------+------------------+ LEFT     CompressibilityPhasicitySpontaneityPropertiesThrombus Aging      +---------+---------------+---------+-----------+----------+------------------+ CFV      Full           Yes      Yes                                     +---------+---------------+---------+-----------+----------+------------------+ SFJ      Full                                                            +---------+---------------+---------+-----------+----------+------------------+ FV Prox  Full                                                            +---------+---------------+---------+-----------+----------+------------------+ FV Mid   Full                                                            +---------+---------------+---------+-----------+----------+------------------+ FV DistalFull                                                            +---------+---------------+---------+-----------+----------+------------------+ PFV      Full                                                            +---------+---------------+---------+-----------+----------+------------------+  POP      Full           Yes      Yes                                     +---------+---------------+---------+-----------+----------+------------------+ PTV      Full                                         visualized with                                                          color              +---------+---------------+---------+-----------+----------+------------------+ PERO     Full                                         visualized with                                                          color              +---------+---------------+---------+-----------+----------+------------------+     Summary: RIGHT: - There is no evidence of deep vein thrombosis in the lower extremity. However, portions of this examination were limited- see technologist comments above.  - No cystic structure found in the popliteal fossa. - Hypoechoic, oval structure with  echogenic center visualized in right groin measuring 4.3 x 1.4 x 2.3.  LEFT: - There is no evidence of deep vein thrombosis in the lower extremity. However, portions of this examination were limited- see technologist comments above.  - No cystic structure found in the popliteal fossa.  *See table(s) above for measurements and observations.    Preliminary     Scheduled Meds: . Chlorhexidine Gluconate Cloth  6 each Topical Daily  . gabapentin  300 mg Oral TID  . insulin aspart  0-9 Units Subcutaneous Q6H  . mouth rinse  15 mL Mouth Rinse BID  . metoprolol tartrate  12.5 mg Oral BID  . pantoprazole  40 mg Oral Q0600    Continuous Infusions: . ampicillin (OMNIPEN) IV Stopped (06/28/19 0558)  . cefTRIAXone (ROCEPHIN)  IV Stopped (06/27/19 1839)     LOS: 5 days     Kayleen Memos, MD [facility] Pager [phone]  If 7PM-7AM, please contact night-coverage [email or web address] Password [facility] 06/28/2019, 11:27 AM

## 2019-06-28 NOTE — Progress Notes (Signed)
Bilateral lower extremity venous duplex exam completed.  Preliminary results can be found under CV proc under chart review.  06/28/2019 9:30 AM  Damisha Wolff, K., RDMS, RVT

## 2019-06-28 NOTE — Progress Notes (Signed)
Paged Sharlet Salina, NP regarding patient's blood pressure 89/46 with MAP of 61.  BP low while asleep and improves with waking. Sinus tach rate 130's consistent.

## 2019-06-28 NOTE — Progress Notes (Signed)
Palliative Care Progress Note  Reason for Consult: Goals of care in light of prostate cancer with bacteremia and renal failure  I met today with [name].    We discussed clinical course as well as wishes moving forward in light of his advanced cancer with bacteremia and renal failure.  He was sleepy this evening but he did seem to follow conversation.  Discussed concern that this may be irreversible process.  Also discussed if he would like to pursue aggressive interventions, recommendations would include interventions that he had stated he would not want in the past, such as nephrostomy tubes.  He then stated that he still is not certain that nephrostomy tubes are something that he would want.  I gently discussed that we need to determine what his limits of care should be, and that if he is going to forgo nephrostomy tubes, then it would not be recommended to pursue other aggressive interventions such as mechanical ventilation or CPR in the event of cardiac or respiratory arrest.  Concepts specific to code status and care plan this hospitalization discussed.    -At this point, [name] stated that he needs to talk with his mother and son about his wishes moving forward. -I attempted to call his mother to discuss care plan but was unable to reach her this evening.  I left a voicemail requesting a return call.   - Will ask another member of PMT to f/u with him again tomorrow.  Questions and concerns addressed.   PMT will continue to support holistically.  Time in: 1820 Time out: 1900 Total time: 40 minutes  Greater than 50%  of this time was spent counseling and coordinating care related to the above assessment and plan.  Micheline Rough, MD [facility] [phone]

## 2019-06-28 NOTE — Progress Notes (Signed)
Subjective: No new complaints   Antibiotics:  Anti-infectives (From admission, onward)   Start     Dose/Rate Route Frequency Ordered Stop   06/27/19 1800  cefTRIAXone (ROCEPHIN) 2 g in sodium chloride 0.9 % 100 mL IVPB  Status:  Discontinued     2 g 200 mL/hr over 30 Minutes Intravenous Every 24 hours 06/27/19 1635 06/28/19 1426   07/11/2019 1400  ampicillin (OMNIPEN) 2 g in sodium chloride 0.9 % 100 mL IVPB     2 g 300 mL/hr over 20 Minutes Intravenous Every 8 hours 06/26/2019 0612     06/27/2019 0115  ampicillin (OMNIPEN) 2 g in sodium chloride 0.9 % 100 mL IVPB  Status:  Discontinued     2 g 300 mL/hr over 20 Minutes Intravenous Every 6 hours 07/10/2019 0107 06/21/2019 0611   06/24/19 0800  cefTRIAXone (ROCEPHIN) 2 g in sodium chloride 0.9 % 100 mL IVPB  Status:  Discontinued     2 g 200 mL/hr over 30 Minutes Intravenous Every 24 hours 06/24/19 0722 06/29/2019 0104   06/25/2019 1700  cefTRIAXone (ROCEPHIN) 1 g in sodium chloride 0.9 % 100 mL IVPB     1 g 200 mL/hr over 30 Minutes Intravenous  Once 07/09/2019 1652 06/29/2019 1949      Medications: Scheduled Meds: . Chlorhexidine Gluconate Cloth  6 each Topical Daily  . gabapentin  300 mg Oral TID  . insulin aspart  0-9 Units Subcutaneous Q6H  . mouth rinse  15 mL Mouth Rinse BID  . metoprolol tartrate  12.5 mg Oral BID  . pantoprazole  40 mg Oral Q0600   Continuous Infusions: . ampicillin (OMNIPEN) IV Stopped (06/28/19 0558)   PRN Meds:.acetaminophen **OR** acetaminophen, fentaNYL (SUBLIMAZE) injection, metoprolol tartrate, ondansetron **OR** ondansetron (ZOFRAN) IV, oxyCODONE, sodium chloride    Objective: Weight change:   Intake/Output Summary (Last 24 hours) at 06/28/2019 1426 Last data filed at 06/28/2019 0700 Gross per 24 hour  Intake 960.65 ml  Output 4850 ml  Net -3889.35 ml   Blood pressure 105/65, pulse (!) 132, temperature (!) 100.4 F (38 C), temperature source Axillary, resp. rate 19, height 5\' 9"  (1.753  m), weight 99.8 kg, SpO2 96 %. Temp:  [98.1 F (36.7 C)-102.2 F (39 C)] 100.4 F (38 C) (03/15 1200) Pulse Rate:  [117-145] 132 (03/15 1000) Resp:  [19-34] 19 (03/15 1000) BP: (103-182)/(55-110) 105/65 (03/15 1000) SpO2:  [93 %-99 %] 96 % (03/15 1000) Weight:  [99.8 kg] 99.8 kg (03/15 0515)  Physical Exam: General: sleepy but aroasable HEENT: anicteric sclera, EOMI CVS tachycardic  normal  Chest: , no wheezing, no respiratory distress Abdomen: soft non-distended,  Extremities: edematous Skin: no rashes Neuro: nonfocal  CBC:    BMET Recent Labs    06/27/19 1716 06/28/19 0316  NA 146* 145  K 3.7 3.6  CL 112* 110  CO2 23 22  GLUCOSE 136* 124*  BUN 37* 39*  CREATININE 3.14* 3.36*  CALCIUM 9.2 9.1     Liver Panel  Recent Labs    06/26/19 0210 06/27/19 0317  PROT 6.0* 5.9*  ALBUMIN 1.6* 1.7*  AST 42* 40  ALT 26 26  ALKPHOS 144* 158*  BILITOT 0.5 0.8       Sedimentation Rate No results for input(s): ESRSEDRATE in the last 72 hours. C-Reactive Protein No results for input(s): CRP in the last 72 hours.  Micro Results: Recent Results (from the past 720 hour(s))  Urine culture  Status: Abnormal   Collection Time: 06/14/2019  3:37 PM   Specimen: Urine, Random  Result Value Ref Range Status   Specimen Description   Final    URINE, RANDOM Performed at [facility] [address] [place], [place] [address]    Special Requests   Final    NONE Performed at [facility], [place] [address], [place], [place] [address]    Culture MULTIPLE SPECIES PRESENT, SUGGEST RECOLLECTION (A)  Final   Report Status 06/24/2019 FINAL  Final  Culture, blood (routine x 2)     Status: Abnormal   Collection Time: 06/22/2019  4:56 PM   Specimen: BLOOD  Result Value Ref Range Status   Specimen Description   Final    BLOOD RIGHT ANTECUBITAL Performed at [facility] [address], [place], [place] [address]     Special Requests   Final    BOTTLES DRAWN AEROBIC ONLY Blood Culture results may not be optimal due to an excessive volume of blood received in culture bottles Performed at [facility] [address], [place], [place] [address]    Culture  Setup Time   Final    AEROBIC BOTTLE ONLY GRAM POSITIVE COCCI IN CHAINS CRITICAL RESULT CALLED TO, READ BACK BY AND VERIFIED WITH: Lavell Luster [facility] 07/01/2019 0032 JDW Performed at [facility], [place] [address], [place], [place] [address]    Culture ENTEROCOCCUS FAECALIS (A)  Final   Report Status 06/26/2019 FINAL  Final   Organism ID, Bacteria ENTEROCOCCUS FAECALIS  Final      Susceptibility   Enterococcus faecalis - MIC*    AMPICILLIN <=2 SENSITIVE Sensitive     VANCOMYCIN 1 SENSITIVE Sensitive     GENTAMICIN SYNERGY SENSITIVE Sensitive     * ENTEROCOCCUS FAECALIS  Blood Culture ID Panel (Reflexed)     Status: Abnormal   Collection Time: 07/03/2019  4:56 PM  Result Value Ref Range Status   Enterococcus species DETECTED (A) NOT DETECTED Final    Comment: CRITICAL RESULT CALLED TO, READ BACK BY AND VERIFIED WITH: J. GRIMSLEY PHARMD 07/03/2019 0032 JDW    Vancomycin resistance NOT DETECTED NOT DETECTED Final   Listeria monocytogenes NOT DETECTED NOT DETECTED Final   Staphylococcus species NOT DETECTED NOT DETECTED Final   Staphylococcus aureus (BCID) NOT DETECTED NOT DETECTED Final   Streptococcus species NOT DETECTED NOT DETECTED Final   Streptococcus agalactiae NOT DETECTED NOT DETECTED Final   Streptococcus pneumoniae NOT DETECTED NOT DETECTED Final   Streptococcus pyogenes NOT DETECTED NOT DETECTED Final   Acinetobacter baumannii NOT DETECTED NOT DETECTED Final   Enterobacteriaceae species NOT DETECTED NOT DETECTED Final   Enterobacter cloacae complex NOT DETECTED NOT DETECTED Final   Escherichia coli NOT DETECTED NOT DETECTED Final   Klebsiella oxytoca NOT DETECTED NOT DETECTED Final   Klebsiella pneumoniae NOT DETECTED  NOT DETECTED Final   Proteus species NOT DETECTED NOT DETECTED Final   Serratia marcescens NOT DETECTED NOT DETECTED Final   Haemophilus influenzae NOT DETECTED NOT DETECTED Final   Neisseria meningitidis NOT DETECTED NOT DETECTED Final   Pseudomonas aeruginosa NOT DETECTED NOT DETECTED Final   Candida albicans NOT DETECTED NOT DETECTED Final   Candida glabrata NOT DETECTED NOT DETECTED Final   Candida krusei NOT DETECTED NOT DETECTED Final   Candida parapsilosis NOT DETECTED NOT DETECTED Final   Candida tropicalis NOT DETECTED NOT DETECTED Final    Comment: Performed at [facility], [place]  [address] [place], [place] [address]  Culture, blood (routine x 2)     Status: Abnormal   Collection Time: 07/01/2019  5:16 PM   Specimen: BLOOD  Result Value Ref Range Status   Specimen Description   Final    BLOOD RIGHT ANTECUBITAL Performed at [facility] [address] [place], [place] [address]    Special Requests   Final    BOTTLES DRAWN AEROBIC AND ANAEROBIC Blood Culture adequate volume Performed at [facility] [address], [place], [place] [address]    Culture  Setup Time   Final    GRAM POSITIVE COCCI IN CHAINS IN BOTH AEROBIC AND ANAEROBIC BOTTLES CRITICAL RESULT CALLED TO, READ BACK BY AND VERIFIED WITH: PHARMD M RENZ [id number] MLM    Culture (A)  Final    ENTEROCOCCUS FAECALIS SUSCEPTIBILITIES PERFORMED ON PREVIOUS CULTURE WITHIN THE LAST 5 DAYS. Performed at [facility], [place] [address], [place], [place] [address]    Report Status 06/26/2019 FINAL  Final  SARS CORONAVIRUS 2 (TAT 6-24 HRS) Nasopharyngeal Nasopharyngeal Swab     Status: None   Collection Time: 06/29/2019  5:20 PM   Specimen: Nasopharyngeal Swab  Result Value Ref Range Status   SARS Coronavirus 2 NEGATIVE NEGATIVE Final    Comment: (NOTE) SARS-CoV-2 target nucleic acids are NOT DETECTED. The SARS-CoV-2 RNA is generally detectable in upper and  lower respiratory specimens during the acute phase of infection. Negative results do not preclude SARS-CoV-2 infection, do not rule out co-infections with other pathogens, and should not be used as the sole basis for treatment or other patient management decisions. Negative results must be combined with clinical observations, patient history, and epidemiological information. The expected result is Negative. Fact Sheet for Patients: SugarRoll.be Fact Sheet for Healthcare Providers: https://www.woods-mathews.com/ This test is not yet approved or cleared by the [place] FDA and  has been authorized for detection and/or diagnosis of SARS-CoV-2 by FDA under an Emergency Use Authorization (EUA). This EUA will remain  in effect (meaning this test can be used) for the duration of the COVID-19 declaration under Section 56 4(b)(1) of the Act, 21 U.S.C. section 360bbb-3(b)(1), unless the authorization is terminated or revoked sooner. Performed at [facility], [place] [address], [place], [place] [address]   MRSA PCR Screening     Status: None   Collection Time: 06/24/19  1:08 AM   Specimen: Nasal Mucosa; Nasopharyngeal  Result Value Ref Range Status   MRSA by PCR NEGATIVE NEGATIVE Final    Comment:        The GeneXpert MRSA Assay (FDA approved for NASAL specimens only), is one component of a comprehensive MRSA colonization surveillance program. It is not intended to diagnose MRSA infection nor to guide or monitor treatment for MRSA infections. Performed at [facility], [place] [address], [place], [place] [address]   Culture, blood (routine x 2)     Status: None (Preliminary result)   Collection Time: 06/26/19  2:10 AM   Specimen: BLOOD  Result Value Ref Range Status   Specimen Description BLOOD RIGHT ARM  Final   Special Requests   Final    BOTTLES DRAWN AEROBIC ONLY Blood Culture adequate volume Performed at [facility] [address], [place], [place] [address]    Culture NO GROWTH 2 DAYS  Final   Report Status PENDING  Incomplete  Culture, blood (routine x 2)     Status: None (Preliminary result)  Collection Time: 06/26/19  2:10 AM   Specimen: BLOOD  Result Value Ref Range Status   Specimen Description BLOOD RIGHT HAND  Final   Special Requests   Final    BOTTLES DRAWN AEROBIC ONLY Blood Culture adequate volume Performed at [facility] [address], [place], [place] [address]    Culture NO GROWTH 2 DAYS  Final   Report Status PENDING  Incomplete    Studies/Results: DG CHEST PORT 1 VIEW  Result Date: 06/27/2019 CLINICAL DATA:  Hypoxia EXAM: PORTABLE CHEST 1 VIEW COMPARISON:  07/13/2019 FINDINGS: Mild patchy right lower lung opacity, more conspicuous than on the prior, mild infection not excluded. Associated right basilar opacity, likely reflecting a combination of atelectasis and small pleural effusion when correlating with prior CT. Left lung is essentially clear. Cardiomegaly. Multifocal/diffuse osseous metastases. IMPRESSION: Mild patchy right lung opacity, mild infection not excluded. Small right pleural effusion with mild right lower lobe atelectasis. Multifocal/diffuse osseous metastases. Electronically Signed   By: Julian Hy M.D.   On: 06/27/2019 08:17   VAS [place] LOWER EXTREMITY VENOUS (DVT)  Result Date: 06/28/2019  Lower Venous DVTStudy Indications: Swelling, Edema, and Pain.  Risk Factors: Cancer prostate. Limitations: Poor ultrasound/tissue interface. Comparison Study: No prior exam. Performing Technologist: Baldwin Crown ARDMS, RVT  Examination Guidelines: A complete evaluation includes B-mode imaging, spectral Doppler, color Doppler, and power Doppler as needed of all accessible portions of each vessel. Bilateral testing is considered an integral part of a complete examination. Limited examinations for reoccurring indications may be  performed as noted. The reflux portion of the exam is performed with the patient in reverse Trendelenburg.  +---------+---------------+---------+-----------+----------+------------------+ RIGHT    CompressibilityPhasicitySpontaneityPropertiesThrombus Aging     +---------+---------------+---------+-----------+----------+------------------+ CFV      Full           Yes      Yes                                     +---------+---------------+---------+-----------+----------+------------------+ SFJ      Full                                                            +---------+---------------+---------+-----------+----------+------------------+ FV Prox  Full                                                            +---------+---------------+---------+-----------+----------+------------------+ FV Mid   Full                                                            +---------+---------------+---------+-----------+----------+------------------+ FV DistalFull                                                            +---------+---------------+---------+-----------+----------+------------------+  PFV      Full                                                            +---------+---------------+---------+-----------+----------+------------------+ POP      Full           Yes      Yes                                     +---------+---------------+---------+-----------+----------+------------------+ PTV      Full                                         visualized with                                                          color              +---------+---------------+---------+-----------+----------+------------------+ PERO     Full                                         visualized with                                                          color              +---------+---------------+---------+-----------+----------+------------------+  Hypoechoic, oval structure with echogenic center visualized in right groin measuring 4.3 x 1.4 x 2.3.  +---------+---------------+---------+-----------+----------+------------------+ LEFT     CompressibilityPhasicitySpontaneityPropertiesThrombus Aging     +---------+---------------+---------+-----------+----------+------------------+ CFV      Full           Yes      Yes                                     +---------+---------------+---------+-----------+----------+------------------+ SFJ      Full                                                            +---------+---------------+---------+-----------+----------+------------------+ FV Prox  Full                                                            +---------+---------------+---------+-----------+----------+------------------+ FV Mid   Full                                                            +---------+---------------+---------+-----------+----------+------------------+  FV DistalFull                                                            +---------+---------------+---------+-----------+----------+------------------+ PFV      Full                                                            +---------+---------------+---------+-----------+----------+------------------+ POP      Full           Yes      Yes                                     +---------+---------------+---------+-----------+----------+------------------+ PTV      Full                                         visualized with                                                          color              +---------+---------------+---------+-----------+----------+------------------+ PERO     Full                                         visualized with                                                          color              +---------+---------------+---------+-----------+----------+------------------+      Summary: RIGHT: - There is no evidence of deep vein thrombosis in the lower extremity. However, portions of this examination were limited- see technologist comments above.  - No cystic structure found in the popliteal fossa. - Hypoechoic, oval structure with echogenic center visualized in right groin measuring 4.3 x 1.4 x 2.3.  LEFT: - There is no evidence of deep vein thrombosis in the lower extremity. However, portions of this examination were limited- see technologist comments above.  - No cystic structure found in the popliteal fossa.  *See table(s) above for measurements and observations.    Preliminary       Assessment/Plan:  INTERVAL HISTORY:   Patient decliing pCN   Principal Problem:   Severe sepsis ([facility]) Active Problems:   Urinary retention   AKI (acute kidney injury) ([facility])   Cholelithiasis   Diabetes mellitus, new onset ([facility])   DM2 (diabetes mellitus, type 2) ([facility])   CKD (chronic kidney disease) stage 3,  GFR 30-59 ml/min   Prostate cancer ([facility])   GI bleed   Heme positive stool   Anemia due to chronic blood loss    Jeffery [name] is a 59 y.o. male with progressive metastatic prostate cancer bilateral hydronephrosis and obstructive uropathy with AMP S enterococcal bacteremia, thought to be from urinary source  #1 AMP S E faecalis bacteremia: Likely from urinary source.  Transthoracic echocardiogram does not show evidence of vegetations  If were being aggressive we will obtain a transesophageal echocardiogram.  I am not however certain that this is the most prudent endeavor given his current condition.  For now I will continue ampicillin  2.  Question of pneumonia I do not think he has a pneumonia that requires addition of ceftriaxone I would add ceftriaxone if I thought he had endocarditis but I do not have clear-cut evidence for this.  3.  Progressive metastatic prostate cancer: Agree with goals of care being continue to be discussed and continued conversations  with palliative care   LOS: 5 days   Alcide Evener 06/28/2019, 2:26 PM

## 2019-06-28 NOTE — Progress Notes (Signed)
Updated the patient's mother Hassan Rowan via phone.  All questions answered.

## 2019-06-29 ENCOUNTER — Encounter (HOSPITAL_COMMUNITY): Payer: Self-pay | Admitting: Internal Medicine

## 2019-06-29 ENCOUNTER — Inpatient Hospital Stay (HOSPITAL_COMMUNITY): Payer: Medicaid Other

## 2019-06-29 DIAGNOSIS — R509 Fever, unspecified: Secondary | ICD-10-CM

## 2019-06-29 DIAGNOSIS — Z515 Encounter for palliative care: Secondary | ICD-10-CM

## 2019-06-29 DIAGNOSIS — Z7189 Other specified counseling: Secondary | ICD-10-CM

## 2019-06-29 LAB — GLUCOSE, CAPILLARY
Glucose-Capillary: 141 mg/dL — ABNORMAL HIGH (ref 70–99)
Glucose-Capillary: 105 mg/dL — ABNORMAL HIGH (ref 70–99)
Glucose-Capillary: 107 mg/dL — ABNORMAL HIGH (ref 70–99)
Glucose-Capillary: 110 mg/dL — ABNORMAL HIGH (ref 70–99)
Glucose-Capillary: 132 mg/dL — ABNORMAL HIGH (ref 70–99)
Glucose-Capillary: 239 mg/dL — ABNORMAL HIGH (ref 70–99)
Glucose-Capillary: 210 mg/dL — ABNORMAL HIGH (ref 70–99)

## 2019-06-29 LAB — PHOSPHORUS: Phosphorus: 5.1 mg/dL — ABNORMAL HIGH (ref 2.5–4.6)

## 2019-06-29 LAB — CBC WITH DIFFERENTIAL/PLATELET
Abs Immature Granulocytes: 0.37 10*3/uL — ABNORMAL HIGH (ref 0.00–0.07)
Basophils Absolute: 0 10*3/uL (ref 0.0–0.1)
Basophils Relative: 0 %
Eosinophils Absolute: 0.1 10*3/uL (ref 0.0–0.5)
Eosinophils Relative: 1 %
HCT: 27.8 % — ABNORMAL LOW (ref 39.0–52.0)
Hemoglobin: 7.7 g/dL — ABNORMAL LOW (ref 13.0–17.0)
Immature Granulocytes: 4 %
Lymphocytes Relative: 14 %
Lymphs Abs: 1.4 10*3/uL (ref 0.7–4.0)
MCH: 25.8 pg — ABNORMAL LOW (ref 26.0–34.0)
MCHC: 27.7 g/dL — ABNORMAL LOW (ref 30.0–36.0)
MCV: 93.3 fL (ref 80.0–100.0)
Monocytes Absolute: 0.6 10*3/uL (ref 0.1–1.0)
Monocytes Relative: 6 %
Neutro Abs: 7.4 10*3/uL (ref 1.7–7.7)
Neutrophils Relative %: 75 %
Platelets: 206 10*3/uL (ref 150–400)
RBC: 2.98 MIL/uL — ABNORMAL LOW (ref 4.22–5.81)
RDW: 18.6 % — ABNORMAL HIGH (ref 11.5–15.5)
WBC: 10 10*3/uL (ref 4.0–10.5)
nRBC: 0 % (ref 0.0–0.2)

## 2019-06-29 LAB — BASIC METABOLIC PANEL
Anion gap: 12 (ref 5–15)
BUN: 44 mg/dL — ABNORMAL HIGH (ref 6–20)
CO2: 24 mmol/L (ref 22–32)
Calcium: 9.1 mg/dL (ref 8.9–10.3)
Chloride: 112 mmol/L — ABNORMAL HIGH (ref 98–111)
Creatinine, Ser: 3.52 mg/dL — ABNORMAL HIGH (ref 0.61–1.24)
GFR calc Af Amer: 21 mL/min — ABNORMAL LOW (ref >60)
GFR calc non Af Amer: 18 mL/min — ABNORMAL LOW (ref >60)
Glucose, Bld: 129 mg/dL — ABNORMAL HIGH (ref 70–99)
Potassium: 3.5 mmol/L (ref 3.5–5.1)
Sodium: 148 mmol/L — ABNORMAL HIGH (ref 135–145)

## 2019-06-29 LAB — CREATININE, URINE, RANDOM: Creatinine, Urine: 25.54 mg/dL

## 2019-06-29 LAB — PROCALCITONIN: Procalcitonin: 16.72 ng/mL

## 2019-06-29 LAB — HEMOGLOBIN AND HEMATOCRIT, BLOOD
HCT: 26 % — ABNORMAL LOW (ref 39.0–52.0)
Hemoglobin: 7.5 g/dL — ABNORMAL LOW (ref 13.0–17.0)

## 2019-06-29 LAB — SODIUM, URINE, RANDOM: Sodium, Ur: 18 mmol/L

## 2019-06-29 IMAGING — CT CT CHEST W/O CM
2 of 4 series · 14 of 36 positions shown, 17 images · non-contrast
Comparison: CT [DATE], bone scan [DATE] CT

CLINICAL DATA: Stage IV prostate carcinoma. Chronic renal disease.
Generalized weakness. Painful urination. Foley catheter placed upon
admission. Anemia.

EXAM:
CT CHEST, ABDOMEN AND PELVIS WITHOUT CONTRAST
TECHNIQUE: Multidetector CT imaging of the chest, abdomen and pelvis was
performed following the standard protocol without IV contrast.

[Series 2: cap w/o · axial · non-contrast · 0.91mm/px · z∈[-575,-20]mm · 11 of 133 slices shown, 14 images]
[im 11/133  mediastinal]
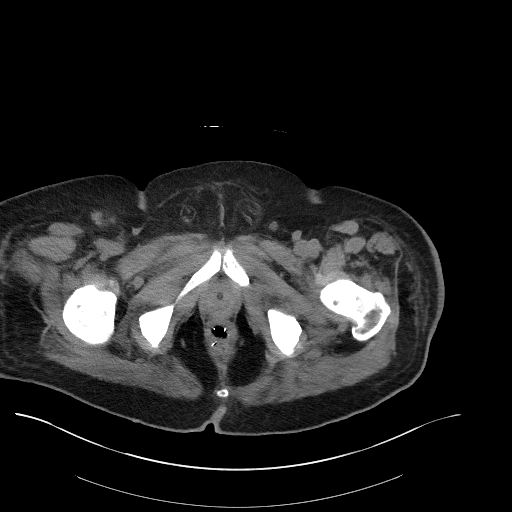
[im 11/133  lung]
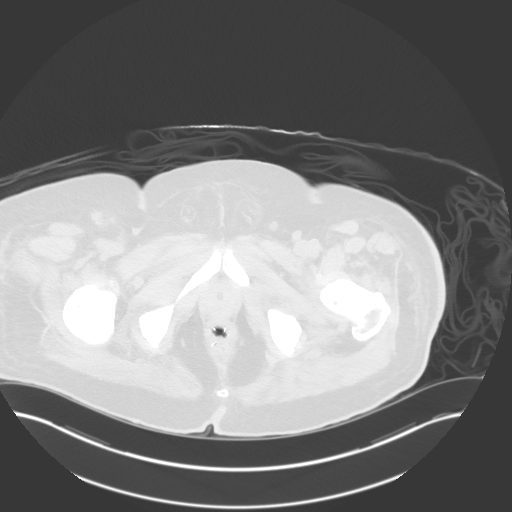
[im 21/133  lung]
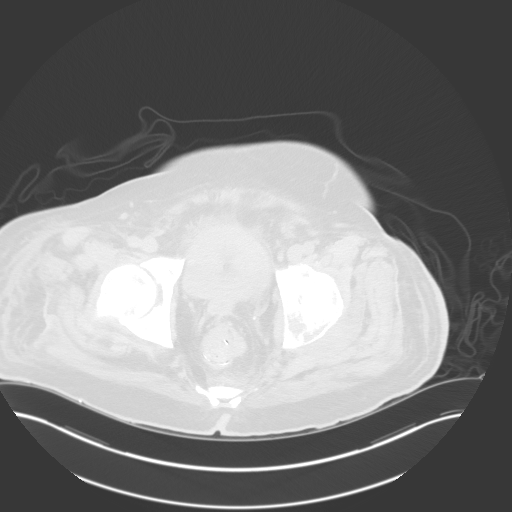
[im 31/133  lung]
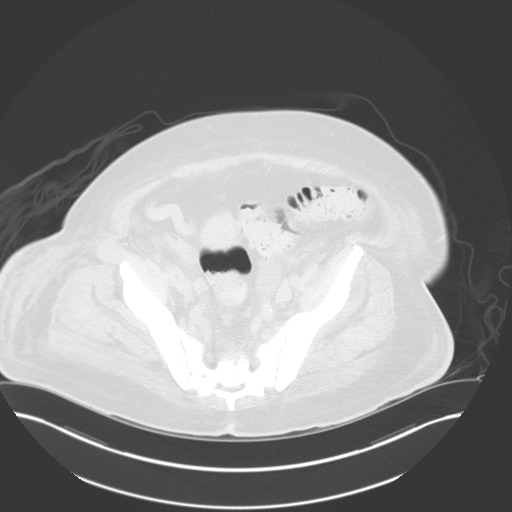
[im 41/133  lung]
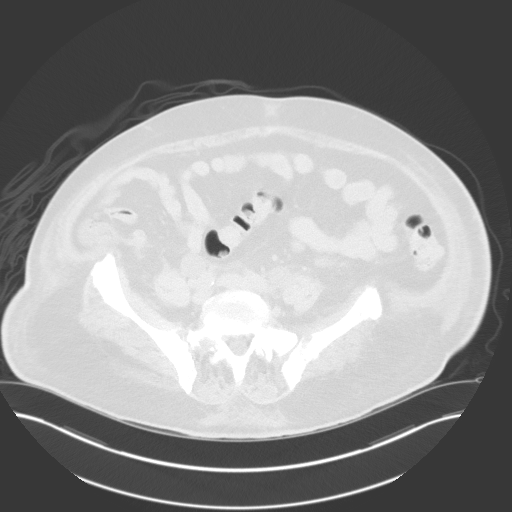
[im 51/133  mediastinal]
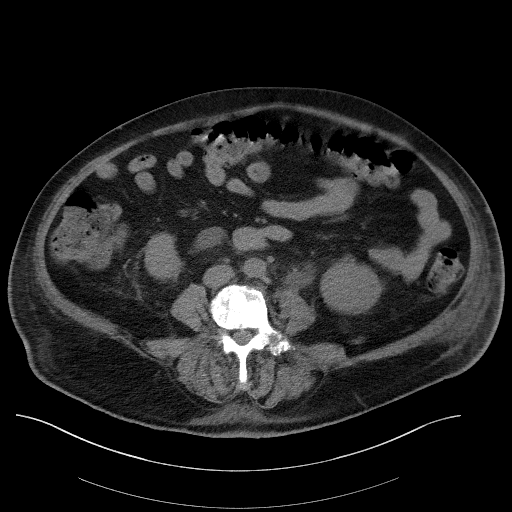
[im 51/133  lung]
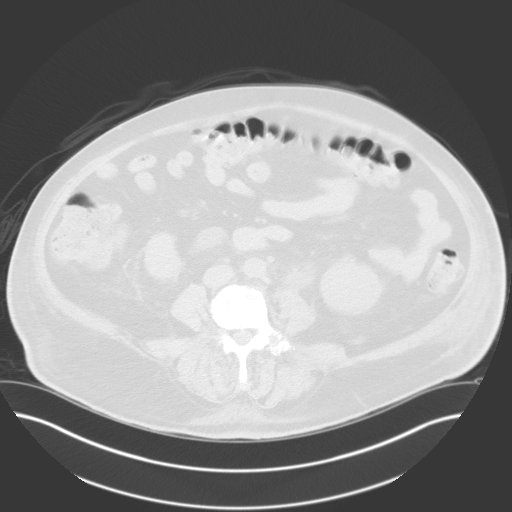
[im 72/133  lung]
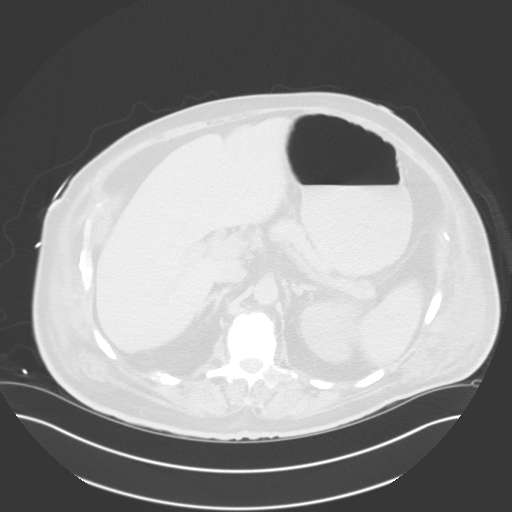
[im 82/133  lung]
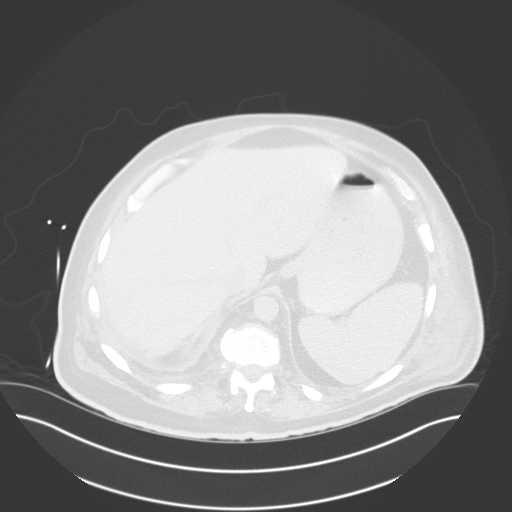
[im 92/133  lung]
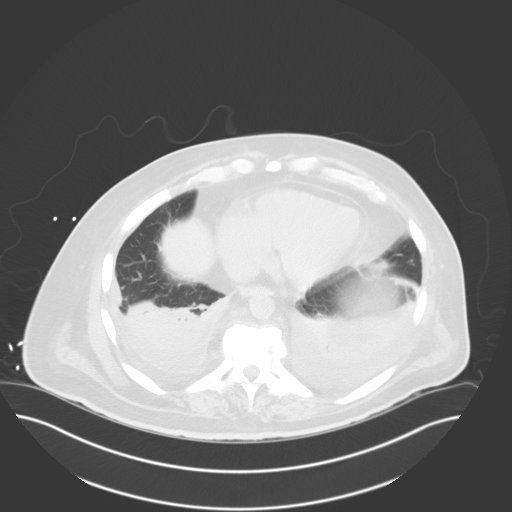
[im 102/133  mediastinal]
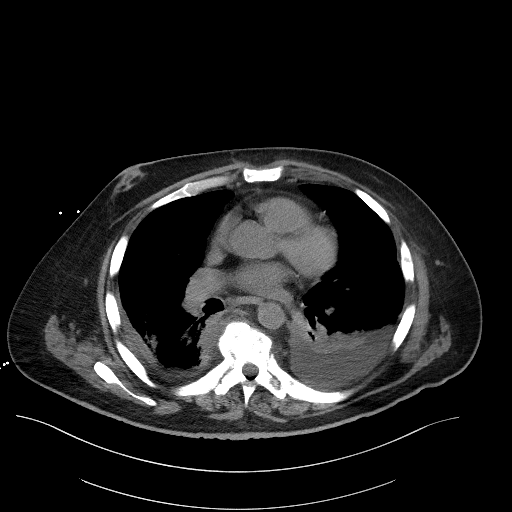
[im 102/133  lung]
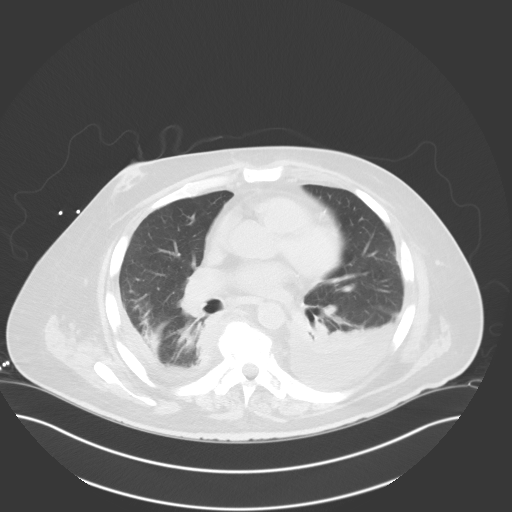
[im 112/133  lung]
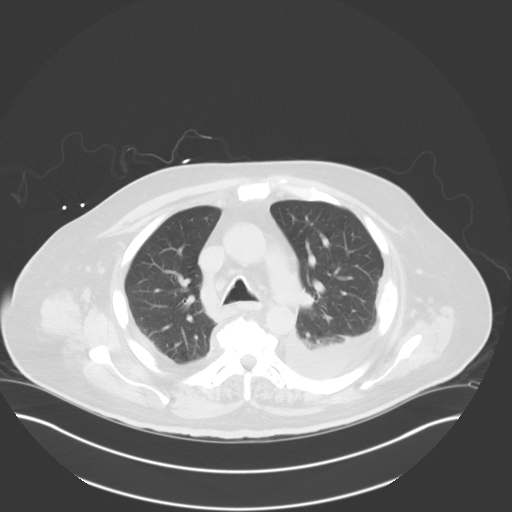
[im 122/133  lung]
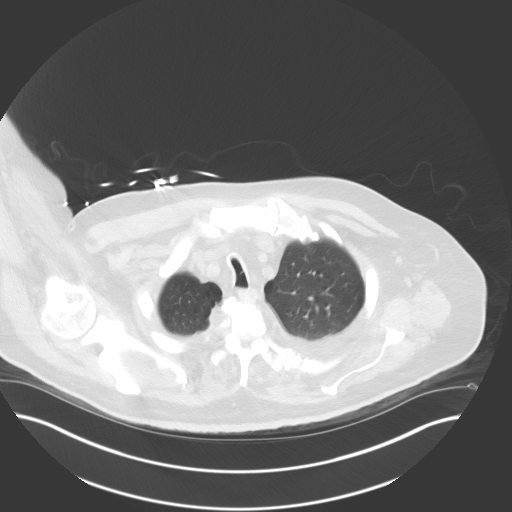

[Series 4: coronals · coronal · 0.97mm/px · 3 of 165 slices shown]
[im 33/165  lung]
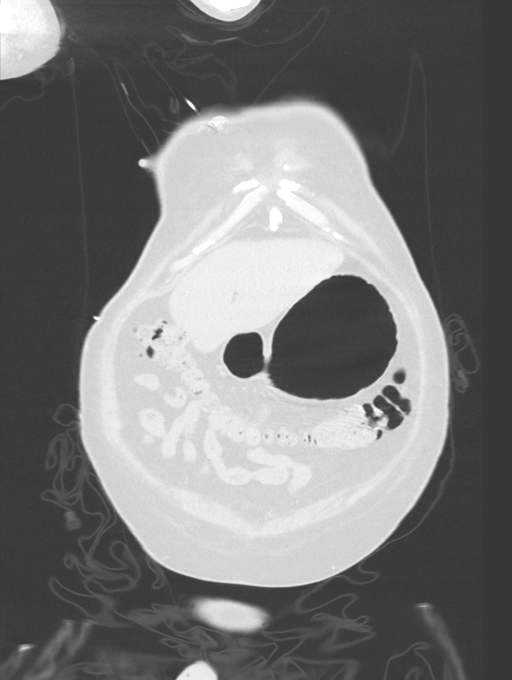
[im 66/165  lung]
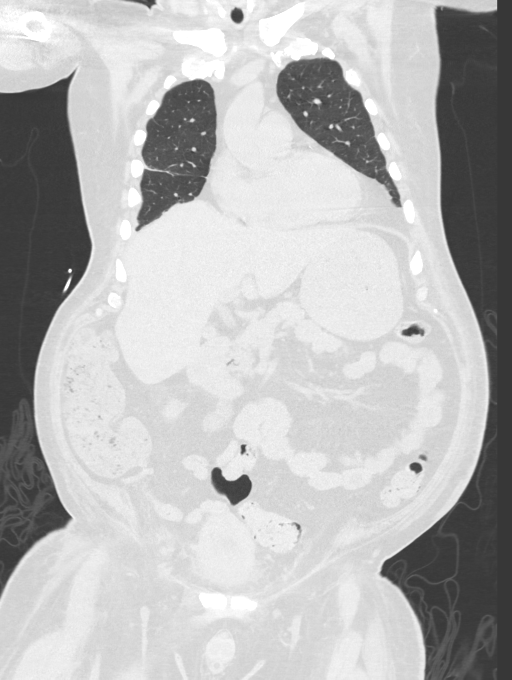
[im 99/165  lung]
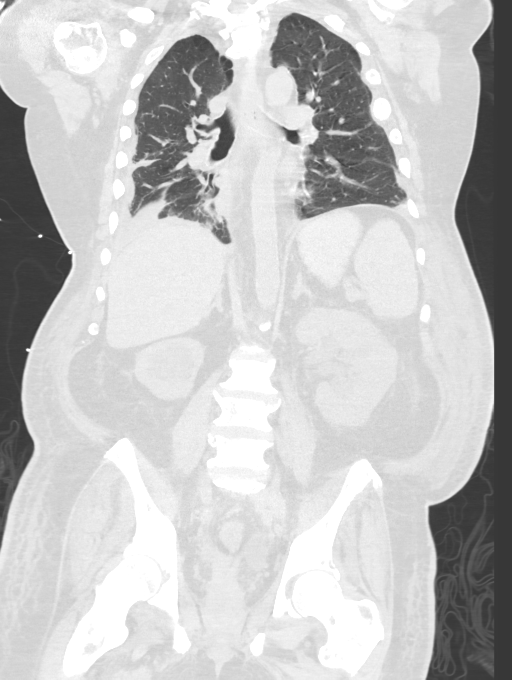

[14 of 36 positions shown; findings below may reference images not displayed]

FINDINGS: CT CHEST FINDINGS

Cardiovascular: No significant vascular findings. Normal heart size.
No pericardial effusion.

Mediastinum/Nodes: No axillary supraclavicular adenopathy. No
mediastinal hilar adenopathy. No pericardial effusion

Lungs/Pleura: Bilateral pleural effusions slightly increased from
prior exam. There is a bibasilar atelectasis also mildly increased
in LEFT lower lobe. No suspicious pulmonary nodules.

Musculoskeletal: Diffuse sclerotic skeletal metastasis throughout
the ribs and spine . Several lesions are expansile within the ribs.
For example the LEFT 5th rib (image 60/6)

CT ABDOMEN AND PELVIS FINDINGS

Hepatobiliary: No focal hepatic lesion on noncontrast exam.
Gallstone within neck of the gallbladder. No gallbladder distension

Pancreas: Pancreas is normal. No ductal dilatation. No pancreatic
inflammation.

Spleen: Normal spleen

Adrenals/urinary tract: Adrenal glands are normal. There is
bilateral severe hydronephrosis and hydroureter. Hydroureter extends
to the urinary bladder. Findings are similar to comparison CT
[DATE]. There is interval placement of a Foley catheter. The
previous described nodule thickening the bladder is not well
appreciated. The bladder is thick-walled and collapsed.

Stomach/Bowel: Stomach, small-bowel and cecum are normal. The
appendix is not identified but there is no pericecal inflammation to
suggest appendicitis. The colon and rectosigmoid colon are normal.
Rectal thermometer noted

Vascular/Lymphatic: Abdominal aorta is normal caliber with
atherosclerotic calcification. There is no retroperitoneal or
periportal lymphadenopathy. No pelvic lymphadenopathy.

Reproductive: Prostate normal

Other: No free fluid.

Musculoskeletal: Widespread sclerotic skeletal metastasis unchanged.
Pathologic fracture identified.
IMPRESSION: Chest Impression:

1. No evidence of thoracic metastasis.
2. Interval increase in moderate effusions and basilar atelectasis.
3. Widespread skeletal metastasis

Abdomen / Pelvis Impression:

1. Persistent bilateral hydronephrosis and hydroureter to the level
of the bladder. Interval placement Foley catheter within thickened
high-density bladder.
2. No inflammation or infection identified in the abdomen pelvis.
3. No source of hemorrhage other than high-density thickening within
the bladder.

## 2019-06-29 IMAGING — CT CT ABD-PELV W/O CM
2 of 4 series · 14 of 36 positions shown, 17 images · non-contrast
Comparison: CT [DATE], bone scan [DATE] CT

CLINICAL DATA: Stage IV prostate carcinoma. Chronic renal disease.
Generalized weakness. Painful urination. Foley catheter placed upon
admission. Anemia.

EXAM:
CT CHEST, ABDOMEN AND PELVIS WITHOUT CONTRAST
TECHNIQUE: Multidetector CT imaging of the chest, abdomen and pelvis was
performed following the standard protocol without IV contrast.

[Series 2: cap w/o · axial · non-contrast · 0.91mm/px · z∈[-575,-20]mm · 11 of 133 slices shown, 14 images]
[im 11/133  mediastinal]
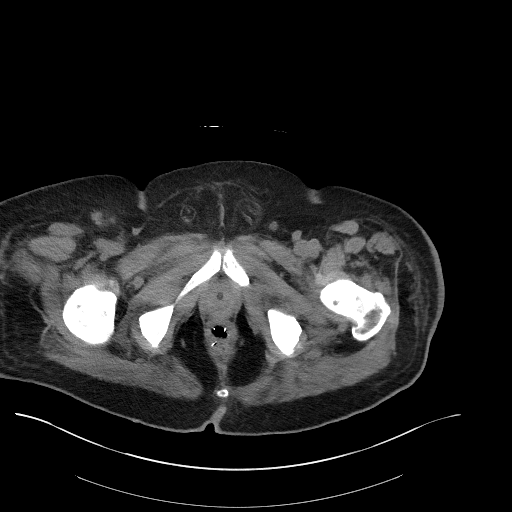
[im 11/133  lung]
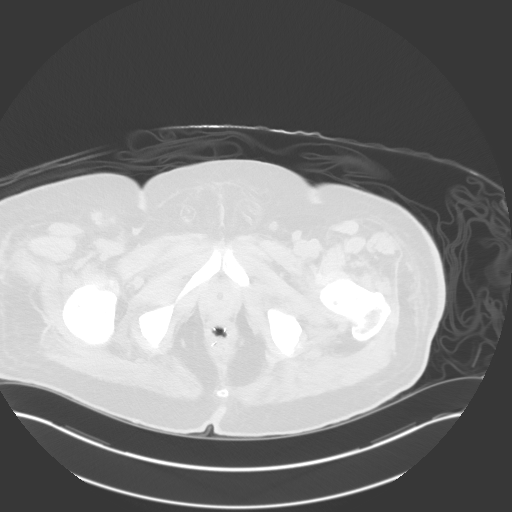
[im 21/133  lung]
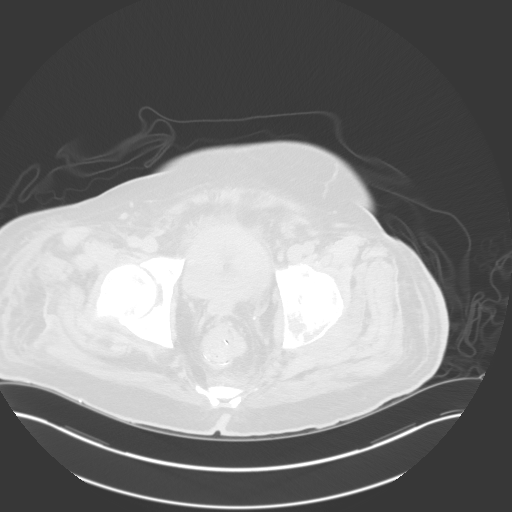
[im 31/133  lung]
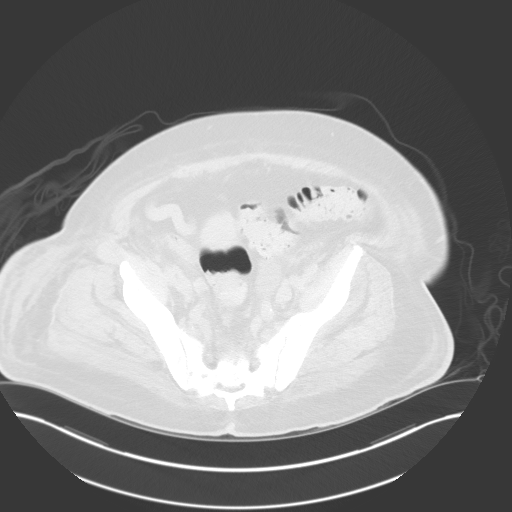
[im 41/133  lung]
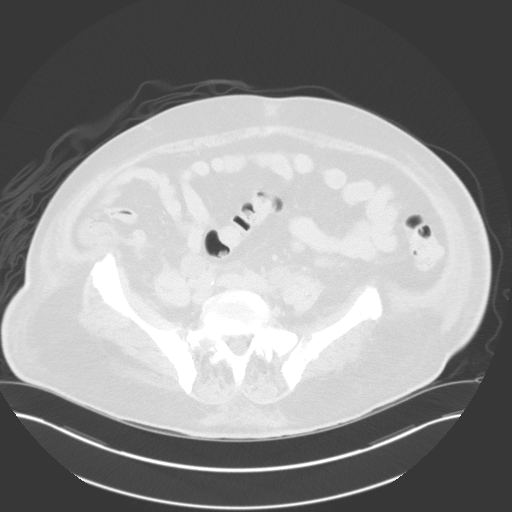
[im 51/133  mediastinal]
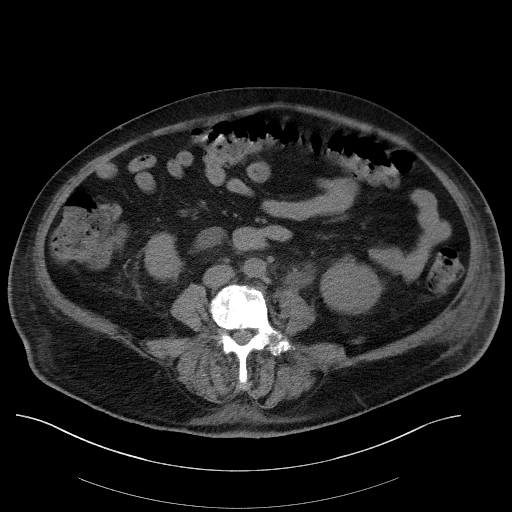
[im 51/133  lung]
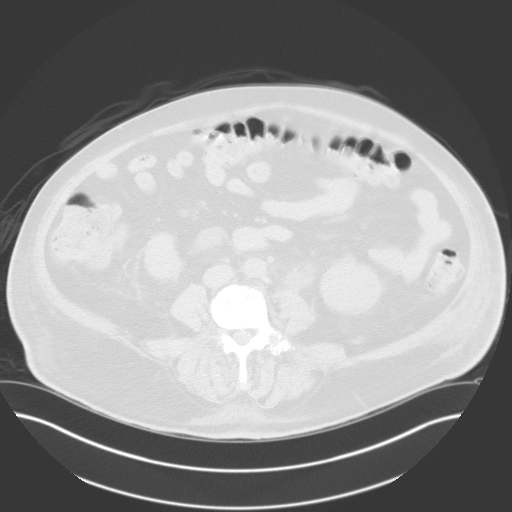
[im 72/133  lung]
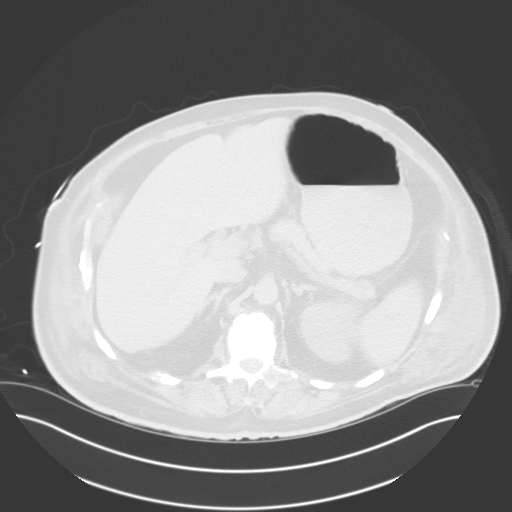
[im 82/133  lung]
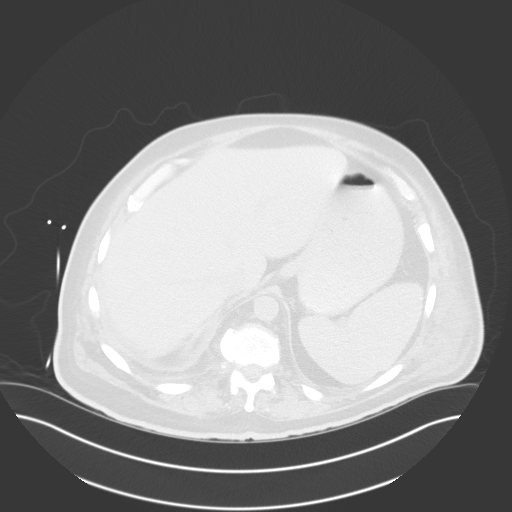
[im 92/133  lung]
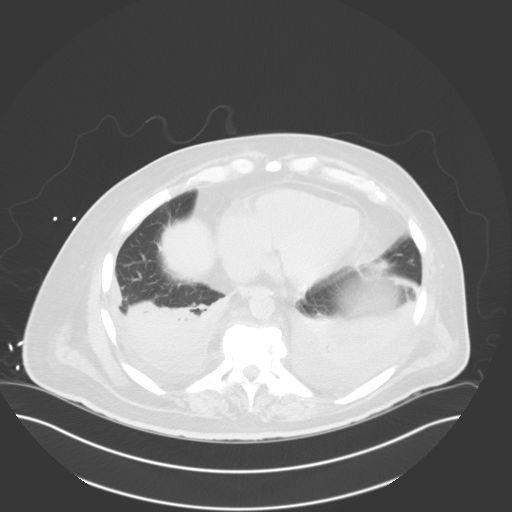
[im 102/133  mediastinal]
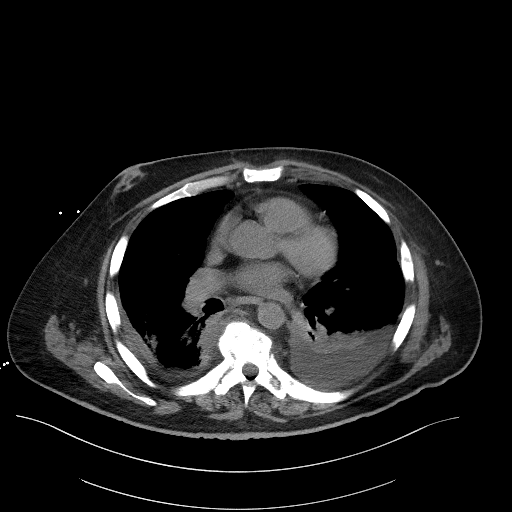
[im 102/133  lung]
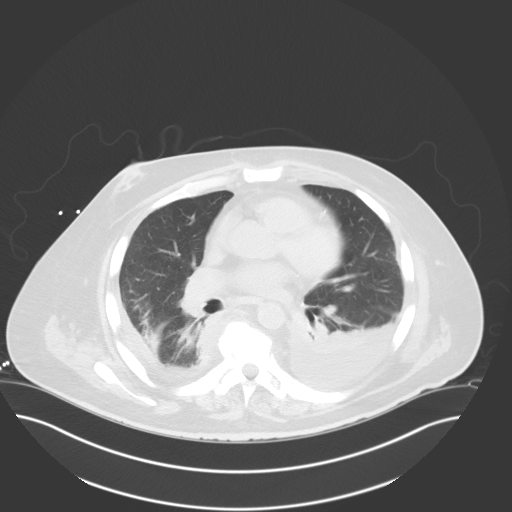
[im 112/133  lung]
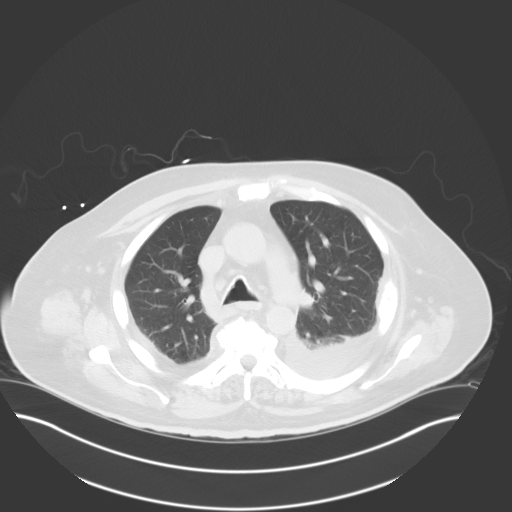
[im 122/133  lung]
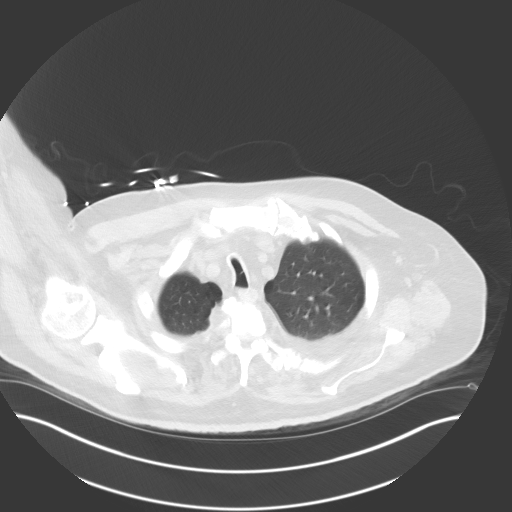

[Series 4: coronals · coronal · 0.97mm/px · 3 of 165 slices shown]
[im 33/165  lung]
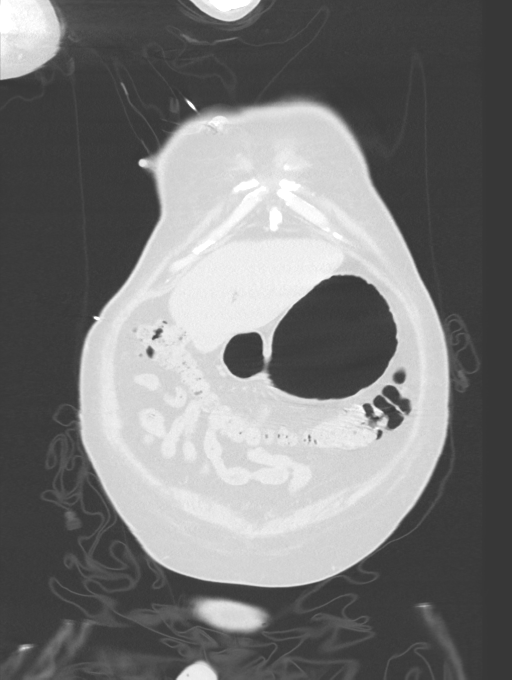
[im 66/165  lung]
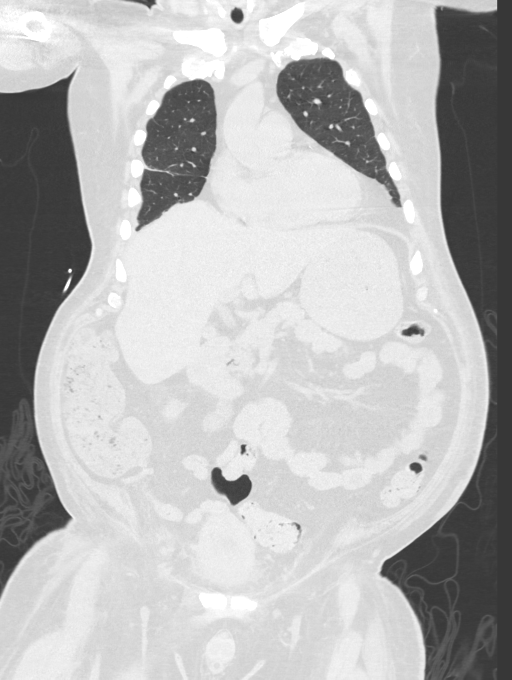
[im 99/165  lung]
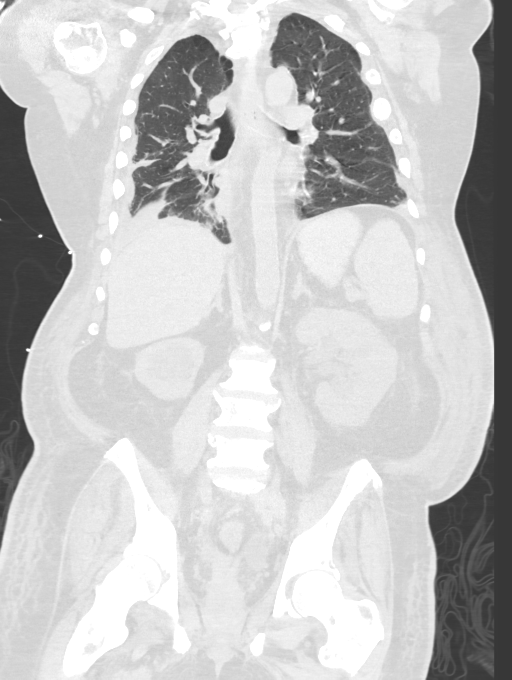

[14 of 36 positions shown; findings below may reference images not displayed]

FINDINGS: CT CHEST FINDINGS

Cardiovascular: No significant vascular findings. Normal heart size.
No pericardial effusion.

Mediastinum/Nodes: No axillary supraclavicular adenopathy. No
mediastinal hilar adenopathy. No pericardial effusion

Lungs/Pleura: Bilateral pleural effusions slightly increased from
prior exam. There is a bibasilar atelectasis also mildly increased
in LEFT lower lobe. No suspicious pulmonary nodules.

Musculoskeletal: Diffuse sclerotic skeletal metastasis throughout
the ribs and spine . Several lesions are expansile within the ribs.
For example the LEFT 5th rib (image 60/6)

CT ABDOMEN AND PELVIS FINDINGS

Hepatobiliary: No focal hepatic lesion on noncontrast exam.
Gallstone within neck of the gallbladder. No gallbladder distension

Pancreas: Pancreas is normal. No ductal dilatation. No pancreatic
inflammation.

Spleen: Normal spleen

Adrenals/urinary tract: Adrenal glands are normal. There is
bilateral severe hydronephrosis and hydroureter. Hydroureter extends
to the urinary bladder. Findings are similar to comparison CT
[DATE]. There is interval placement of a Foley catheter. The
previous described nodule thickening the bladder is not well
appreciated. The bladder is thick-walled and collapsed.

Stomach/Bowel: Stomach, small-bowel and cecum are normal. The
appendix is not identified but there is no pericecal inflammation to
suggest appendicitis. The colon and rectosigmoid colon are normal.
Rectal thermometer noted

Vascular/Lymphatic: Abdominal aorta is normal caliber with
atherosclerotic calcification. There is no retroperitoneal or
periportal lymphadenopathy. No pelvic lymphadenopathy.

Reproductive: Prostate normal

Other: No free fluid.

Musculoskeletal: Widespread sclerotic skeletal metastasis unchanged.
Pathologic fracture identified.
IMPRESSION: Chest Impression:

1. No evidence of thoracic metastasis.
2. Interval increase in moderate effusions and basilar atelectasis.
3. Widespread skeletal metastasis

Abdomen / Pelvis Impression:

1. Persistent bilateral hydronephrosis and hydroureter to the level
of the bladder. Interval placement Foley catheter within thickened
high-density bladder.
2. No inflammation or infection identified in the abdomen pelvis.
3. No source of hemorrhage other than high-density thickening within
the bladder.

## 2019-06-29 MED ORDER — RESOURCE INSTANT PROTEIN PO PWD PACKET
1.0000 | Freq: Three times a day (TID) | ORAL | Status: DC
  Administered 2019-06-29 – 2019-07-07 (×17): 6 g via ORAL
  Filled 2019-06-29 (×24): qty 6

## 2019-06-29 MED ORDER — METOPROLOL TARTRATE 5 MG/5ML IV SOLN
2.5000 mg | Freq: Once | INTRAVENOUS | Status: AC
  Administered 2019-06-29: 2.5 mg via INTRAVENOUS
  Filled 2019-06-29: qty 5

## 2019-06-29 MED ORDER — BENEPROTEIN PO POWD
1.0000 | Freq: Three times a day (TID) | ORAL | Status: DC
  Filled 2019-06-29 (×2): qty 227

## 2019-06-29 MED ORDER — ADULT MULTIVITAMIN W/MINERALS CH
1.0000 | ORAL_TABLET | Freq: Every day | ORAL | Status: DC
  Administered 2019-06-30 – 2019-07-05 (×6): 1 via ORAL
  Filled 2019-06-29 (×6): qty 1

## 2019-06-29 MED ORDER — ENSURE ENLIVE PO LIQD
237.0000 mL | Freq: Three times a day (TID) | ORAL | Status: DC
  Administered 2019-06-29 – 2019-07-02 (×9): 237 mL via ORAL
  Administered 2019-07-03: 22:00:00 150 mL via ORAL
  Administered 2019-07-03 – 2019-07-05 (×5): 237 mL via ORAL

## 2019-06-29 MED ORDER — PRO-STAT SUGAR FREE PO LIQD
30.0000 mL | Freq: Two times a day (BID) | ORAL | Status: DC
  Administered 2019-06-29 – 2019-07-06 (×12): 30 mL via ORAL
  Filled 2019-06-29 (×13): qty 30

## 2019-06-29 MED ORDER — GABAPENTIN 100 MG PO CAPS
100.0000 mg | ORAL_CAPSULE | Freq: Two times a day (BID) | ORAL | Status: DC
  Administered 2019-06-29 – 2019-07-05 (×14): 100 mg via ORAL
  Filled 2019-06-29 (×15): qty 1

## 2019-06-29 MED ORDER — IBUPROFEN 200 MG PO TABS
400.0000 mg | ORAL_TABLET | Freq: Once | ORAL | Status: AC
  Administered 2019-06-29: 400 mg via ORAL
  Filled 2019-06-29: qty 2

## 2019-06-29 MED ORDER — LACTATED RINGERS IV SOLN: INTRAVENOUS | Status: DC

## 2019-06-29 NOTE — Progress Notes (Signed)
Paged Jeffery Salina, NP and she called back and is aware of temperature of 103 therefore NP gave order to go ahead and give tylenol early since it can be given at midnight, NP also gave order for cooling blanket.

## 2019-06-29 NOTE — Progress Notes (Signed)
Paged DR Nevada Crane in [place] now.

## 2019-06-29 NOTE — Progress Notes (Signed)
Cooling blanket turned off- patient temp 98.105F

## 2019-06-29 NOTE — Progress Notes (Signed)
Daily Progress Note   Patient Name: Jeffery [name]       Date: 06/29/2019 DOB: May 30, 1960  Age: 59 y.o. MRN#: [id number] Attending Physician: Kayleen Memos, DO Primary Care Physician: [name], MD Admit Date: 06/25/2019  Reason for Consultation/Follow-up: Establishing goals of care  Subjective:  awake alert Resting in bed Discussed with patient, also with mom on the phone. Discussed with TRH MD.   Length of Stay: 6  Current Medications: Scheduled Meds:  . Chlorhexidine Gluconate Cloth  6 each Topical Daily  . feeding supplement (ENSURE ENLIVE)  237 mL Oral TID BM  . feeding supplement (PRO-STAT SUGAR FREE 64)  30 mL Oral BID  . gabapentin  100 mg Oral BID  . insulin aspart  0-9 Units Subcutaneous Q6H  . mouth rinse  15 mL Mouth Rinse BID  . metoprolol tartrate  12.5 mg Oral BID  . multivitamin with minerals  1 tablet Oral Daily  . pantoprazole  40 mg Oral Q0600  . protein supplement  1 Scoop Oral TID WC    Continuous Infusions: . ampicillin (OMNIPEN) IV Stopped (06/29/19 1354)  . lactated ringers 75 mL/hr at 06/29/19 1105    PRN Meds: acetaminophen **OR** acetaminophen, fentaNYL (SUBLIMAZE) injection, metoprolol tartrate, ondansetron **OR** ondansetron (ZOFRAN) IV, oxyCODONE, sodium chloride  Physical Exam         Appears with generalized weakness Awake alert resting in bed Avoids eye contact Flat affect Regular work of breathing Has edema Abdomen not distended  Vital Signs: BP 123/72 (BP Location: Left Arm)   Pulse (!) 126   Temp 99.7 F (37.6 C) (Rectal)   Resp 20   Ht 5\' 9"  (1.753 m)   Wt 99.9 kg   SpO2 94%   BMI 32.52 kg/m  SpO2: SpO2: 94 % O2 Device: O2 Device: Nasal Cannula O2 Flow Rate: O2 Flow Rate (L/min): 1 L/min  Intake/output  summary:   Intake/Output Summary (Last 24 hours) at 06/29/2019 1428 Last data filed at 06/29/2019 1334 Gross per 24 hour  Intake 2494 ml  Output 2700 ml  Net -206 ml   LBM: Last BM Date: 06/26/19 Baseline Weight: Weight: 98.5 kg Most recent weight: Weight: 99.9 kg       Palliative Assessment/Data:    Flowsheet Rows     Most Recent Value  Intake Tab  Referral Department  Hospitalist  Unit at Time of Referral  ICU  Palliative Care Primary Diagnosis  Sepsis/Infectious Disease  Date Notified  06/24/19  Palliative Care Type  New Palliative care  Reason for referral  Clarify Goals of Care  Date of Admission  06/22/2019  Date first seen by Palliative Care  06/22/2019  # of days Palliative referral response time  1 Day(s)  # of days IP prior to Palliative referral  1  Clinical Assessment  Palliative Performance Scale Score  30%  Psychosocial & Spiritual Assessment  Palliative Care Outcomes  Patient/Family meeting held?  Yes  Who was at the meeting?  patient, mother via phone      Patient Active Problem List   Diagnosis Date Noted  . Anemia   . Enterococcal bacteremia   . Severe sepsis ([place]) 06/24/2019  . Heme positive stool   . Anemia due to chronic blood loss   . GI bleed 07/06/2019  . Seizure-like activity ([place]) 05/20/2019  . Metastatic cancer to spine ([place]) 04/20/2019  . Cervical radiculopathy 02/23/2019  . Pain in thoracic spine 02/23/2019  . Neck pain 02/18/2019  . Open fracture of base of distal phalanx of left thumb 02/04/2019  . Acute renal failure superimposed on stage 3 chronic kidney disease ([place]) 10/13/2018  . Vitamin B12 deficiency 10/13/2018  . Prostate cancer ([place]) 10/13/2018  . Acute cystitis with hematuria   . Acute hyperkalemia   . DM2 (diabetes mellitus, type 2) ([place]) 10/12/2018  . Dysuria 10/12/2018  . CKD (chronic kidney disease) stage 3, GFR 30-59 ml/min 10/12/2018  . Hyperkalemia 10/12/2018  . Metabolic acidosis, normal anion gap (NAG)  10/12/2018  . Sinus tachycardia 07/31/2016  . Diabetes mellitus, new onset ([place])   . Hyperglycemia 07/29/2016  . Urinary retention 07/29/2016  . AKI (acute kidney injury) ([place]) 07/29/2016  . Cholelithiasis 07/29/2016  . Fatty liver 07/29/2016  . Hydronephrosis 07/29/2016  . Hypertension 07/29/2016    Palliative Care Assessment & Plan   Patient Profile:    Assessment: 59 year old gentleman with progressive metastatic cancer bilateral hydronephrosis obstructive uropathy.  Admitted with enterococcal bacteremia and worsening renal function.  Infectious disease specialists urology and nephrology following.  Palliative care consulted for goals of care discussions.  Recommendations/Plan:  Family meeting separately with patient's mother on the phone to extensively discuss CODE STATUS and goals of care, subsequently also arrived at the bedside and discussed in detail with the patient.  He avoids eye contact and has flat affect will not engage much.  He states that he rested well overnight.  We reviewed the patient's underlying serious irreversible illness as well as his acute conditions requiring this hospitalization.   Goals wishes and values attempted to be explored.  Discussed with patient about the difference between full code, full scope and aggressive mode of care versus a more palliative/symptom focused approach to care.  Discussed about hospice philosophy of care specifically home with hospice was explained to him.  We also discussed the differences between full code and DO NOT RESUSCITATE.  Patient is thankful for the information provided and thanked me for my visit.  He will continue discussions with his mother as well as his son.  Offered active listening and supportive care.  Patient has not made any decisions yet.  PMT to follow along.  Goals of Care and Additional Recommendations:  Limitations on Scope of Treatment: Full Scope Treatment  Code Status:    Code Status Orders    (From admission,  onward)         Start     Ordered   06/24/19 0050  Full code  Continuous     06/24/19 0049        Code Status History    Date Active Date Inactive Code Status Order ID Comments User Context   10/12/2018 0205 10/14/2018 1836 Full Code [id number]  Etta Quill, DO ED   07/29/2016 1840 08/01/2016 2043 Full Code [id number]  Florinda Marker, MD Inpatient   Advance Care Planning Activity       Prognosis:   Unable to determine  Discharge Planning:  To Be Determined  Care plan was discussed with patient, mother on the phone.    Thank you for allowing the Palliative Medicine Team to assist in the care of this patient.   Time In: 1300 Time Out: 1335 Total Time 35 Prolonged Time Billed  no       Greater than 50%  of this time was spent counseling and coordinating care related to the above assessment and plan.  Loistine Chance, MD  Please contact Palliative Medicine Team phone at [phone] for questions and concerns.

## 2019-06-29 NOTE — Progress Notes (Signed)
Cooling blanket turned back on set at 99.38F. will continue to monitor

## 2019-06-29 NOTE — Progress Notes (Signed)
Spoke with Dr Nevada Crane and updated her on patient status. Dr Nevada Crane is placing new orders.

## 2019-06-29 NOTE — Evaluation (Signed)
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  Clinical Impression  The patient was very drowsy initially. Aroused and requested a bed pan. Patient had a large BM. Assisted to be cleaned, then assisted to sitting on bed edge. Stood x 2 at [employer] a few small side steps. The patient is very weak, was at home with mother assisting ang was having HHPT. Will further assess DC plan as patient progresses.     Follow Up Recommendations SNF;Home health PT- 24/7 assistance    Equipment Recommendations  (tba)    Recommendations for Other Services       Precautions / Restrictions Precautions Precautions: Fall Precaution Comments: loose BM Restrictions Weight Bearing Restrictions: No      Mobility  Bed Mobility Overal bed mobility: Needs Assistance Bed Mobility: Rolling;Supine to Sit;Sit to Supine Rolling: Mod assist   Supine to sit: +2 for safety/equipment;Max assist Sit to supine: +2 for safety/equipment;Max assist   General bed mobility comments: multimodal cues  for rolling, patient had a large BM. extra time and encouragement to mobilize, assisting with legs and trunk.  Transfers Overall transfer level: Needs assistance Equipment used: Rolling walker (2 wheeled) Transfers: Sit to/from Stand Sit to Stand: Mod assist;+2 physical assistance;+2 safety/equipment         General transfer comment: did take small side steps along th  bed edge.  Ambulation/Gait                Stairs            Wheelchair Mobility    Modified Rankin (Stroke Patients Only)       Balance Overall balance assessment: Needs assistance   Sitting balance-Leahy Scale: Fair     Standing balance support: Bilateral upper extremity supported Standing balance-Leahy Scale: Poor                               Pertinent Vitals/Pain Pain Assessment: No/denies pain    Home Living Family/patient expects to be discharged to:: Private residence Living Arrangements: Parent Available Help at Discharge: Available PRN/intermittently Type of Home: House Home Access: Stairs to enter Entrance Stairs-Rails: None Entrance Stairs-Number of Steps: 3 Home Layout: One level Home Equipment: Cane - single point      Prior Function Level of Independence: Independent with assistive device(s)         Comments: Cane to mobilize with in environment     Hand Dominance   Dominant Hand: Right    Extremity/Trunk Assessment        Lower Extremity Assessment Lower Extremity Assessment: Generalized weakness    Cervical / Trunk Assessment Cervical / Trunk Assessment: Normal  Communication   Communication: Receptive difficulties;Expressive difficulties  Cognition Arousal/Alertness: Awake/alert Behavior During Therapy: WFL for tasks assessed/performed Overall Cognitive Status: Impaired/Different from baseline Area of Impairment: Attention;Safety/judgement;Awareness;Problem solving                   Current Attention Level: Selective     Safety/Judgement: Decreased awareness of  deficits;Decreased awareness of safety Awareness: Emergent Problem Solving: Slow processing;Decreased initiation;Requires verbal cues        General Comments      Exercises     Assessment/Plan    PT Assessment Patient needs continued PT services  PT Problem List Decreased strength;Decreased balance;Decreased  cognition;Decreased range of motion;Decreased mobility;Decreased knowledge of use of DME;Decreased activity tolerance;Decreased safety awareness;Cardiopulmonary status limiting activity       PT Treatment Interventions DME instruction;Functional mobility training;Gait training;Therapeutic activities;Therapeutic exercise;Balance training;Cognitive remediation;Patient/family education    PT Goals (Current goals can be found in the Care Plan section)  Acute Rehab PT Goals Patient Stated Goal: to go home PT Goal Formulation: With patient Time For Goal Achievement: 07/13/19 Potential to Achieve Goals: Fair    Frequency Min 2X/week(incr if DC Home)   Barriers to discharge Decreased caregiver support      Co-evaluation PT/OT/SLP Co-Evaluation/Treatment: Yes Reason for Co-Treatment: For patient/therapist safety PT goals addressed during session: Mobility/safety with mobility OT goals addressed during session: ADL's and self-care       AM-PAC PT "6 Clicks" Mobility  Outcome Measure Help needed turning from your back to your side while in a flat bed without using bedrails?: A Lot Help needed moving from lying on your back to sitting on the side of a flat bed without using bedrails?: A Lot Help needed moving to and from a bed to a chair (including a wheelchair)?: Total Help needed standing up from a chair using your arms (e.g., wheelchair or bedside chair)?: Total Help needed to walk in hospital room?: Total Help needed climbing 3-5 steps with a railing? : Total 6 Click Score: 8    End of Session Equipment Utilized During Treatment: Gait belt;Oxygen Activity Tolerance: Patient limited by fatigue Patient left: in bed;with call bell/phone within reach Nurse Communication: Mobility status PT Visit Diagnosis: Unsteadiness on feet (R26.81);Difficulty in walking, not elsewhere classified (R26.2)    Time: 1610-9604 PT Time Calculation (min) (ACUTE ONLY): 33 min   Charges:   PT  Evaluation $PT Eval Moderate Complexity: [place] Pager [phone] Office [phone]   Claretha Cooper 06/29/2019, 4:29 PM

## 2019-06-29 NOTE — Progress Notes (Addendum)
Rectal probe replaced, temp reading 103.24F, cooling blanket still on and ice pack placed on bilateral groin and right neck/chest. Paged Dr Nevada Crane and set V.S to cycle every 30 mins . Will continue to monitor.

## 2019-06-29 NOTE — Progress Notes (Signed)
Urology Inpatient Progress Report  Metabolic encephalopathy [G38.75] Prostate cancer ([place]) [C61] GI bleed [K92.2] Hypoxia [R09.02] Left leg weakness [R29.898] AKI (acute kidney injury) ([place]) [N17.9] Urinary tract infection without hematuria, site unspecified [N39.0] Anemia, unspecified type [D64.9]   Intv/Subj: Patient seems more alert today.  He has had good urine output but his creatinine remains worse at 3.5.  He has intermittent fevers and persistent tachycardia.  Principal Problem:   Severe sepsis ([place]) Active Problems:   Urinary retention   AKI (acute kidney injury) ([place])   Cholelithiasis   Diabetes mellitus, new onset ([place])   DM2 (diabetes mellitus, type 2) ([facility])   CKD (chronic kidney disease) stage 3, GFR 30-59 ml/min   Prostate cancer ([facility])   GI bleed   Heme positive stool   Anemia due to chronic blood loss   Anemia   Enterococcal bacteremia   Palliative care by specialist   Goals of care, counseling/discussion  Current Facility-Administered Medications  Medication Dose Route Frequency Provider Last Rate Last Admin  . acetaminophen (TYLENOL) tablet 650 mg  650 mg Oral Q6H PRN Lang Snow, FNP   650 mg at 06/29/19 0715   Or  . acetaminophen (TYLENOL) suppository 650 mg  650 mg Rectal Q6H PRN Lang Snow, FNP      . ampicillin (OMNIPEN) 2 g in sodium chloride 0.9 % 100 mL IVPB  2 g Intravenous Q8H Barb Merino, MD   Stopped at 06/29/19 1354  . Chlorhexidine Gluconate Cloth 2 % PADS 6 each  6 each Topical Daily Elwyn Reach, MD   6 each at 06/29/19 0944  . feeding supplement (ENSURE ENLIVE) (ENSURE ENLIVE) liquid 237 mL  237 mL Oral TID BM Hall, Carole N, DO   237 mL at 06/29/19 1334  . feeding supplement (PRO-STAT SUGAR FREE 64) liquid 30 mL  30 mL Oral BID Irene Pap N, DO   30 mL at 06/29/19 1236  . fentaNYL (SUBLIMAZE) injection 25 mcg  25 mcg Intravenous Q2H PRN Micheline Rough, MD      . gabapentin (NEURONTIN) capsule 100 mg  100 mg Oral BID Irene Pap N, DO   100 mg at 06/29/19 0944  . insulin aspart (novoLOG) injection 0-9 Units  0-9 Units Subcutaneous Q6H Barb Merino, MD   3 Units at 06/29/19 1236  . lactated ringers infusion   Intravenous Continuous Rexene Agent, MD 75 mL/hr at 06/29/19 1105 New Bag at 06/29/19 1105  . MEDLINE mouth rinse  15 mL Mouth Rinse BID Gala Romney L, MD   15 mL at 06/29/19 0944  . metoprolol tartrate (LOPRESSOR) injection 2.5 mg  2.5 mg Intravenous Q4H PRN Irene Pap N, DO   2.5 mg at 06/28/19 0530  . metoprolol tartrate (LOPRESSOR) tablet 12.5 mg  12.5 mg Oral BID Irene Pap N, DO   12.5 mg at 06/29/19 0944  . multivitamin with minerals tablet 1 tablet  1 tablet Oral Daily Hall, Carole N, DO      . ondansetron (ZOFRAN) tablet 4 mg  4 mg Oral Q6H PRN Elwyn Reach, MD       Or  . ondansetron (ZOFRAN) injection 4 mg  4 mg Intravenous Q6H PRN Gala Romney L, MD      . oxyCODONE (Oxy IR/ROXICODONE) immediate release tablet 5 mg  5 mg Oral Q6H PRN Barb Merino, MD   5 mg at 06/28/19 1801  . pantoprazole (PROTONIX) EC tablet 40 mg  40 mg Oral Q0600 Vena Rua, PA-C  40 mg at 06/29/19 0530  . protein supplement (RESOURCE BENEPROTEIN) powder 6 g  1 Scoop Oral TID WC Hall, Carole N, DO      . sodium chloride (OCEAN) 0.65 % nasal spray 1 spray  1 spray Each Nare PRN Barb Merino, MD         Objective: Vital: Vitals:   06/29/19 1300 06/29/19 1338 06/29/19 1400 06/29/19 1500  BP: 118/69  123/72 130/70  Pulse: (!) 125 (!) 119 (!) 126 (!) 123  Resp: 17 20 20  (!) 22  Temp: 99.7 F (37.6 C)  97.6 F (36.4 C)   TempSrc: Rectal  Oral   SpO2: 97% 94% 94% 95%  Weight:      Height:       I/Os: I/O last 3 completed shifts: In: 1730.7 [P.O.:720; Blood:315; IV Piggyback:695.7] Out: 7850 [Urine:7850]  Physical Exam:  General: Patient is in no apparent distress .  Flat affect but is responding more appropriately today. Lungs: Normal respiratory effort, chest expands  symmetrically. GI: The abdomen is soft and nontender without mass. Foley:   Draining clear yellow urine  Ext: lower extremities symmetric  Lab Results: Recent Labs    06/27/19 0317 06/27/19 1930 06/28/19 0316 06/29/19 0212 06/29/19 1054  WBC 9.0  --  9.3 10.0  --   HGB 7.6*   < > 8.8* 7.7* 7.5*  HCT 27.7*   < > 31.1* 27.8* 26.0*   < > = values in this interval not displayed.   Recent Labs    06/27/19 1716 06/28/19 0316 06/29/19 0212  NA 146* 145 148*  K 3.7 3.6 3.5  CL 112* 110 112*  CO2 23 22 24   GLUCOSE 136* 124* 129*  BUN 37* 39* 44*  CREATININE 3.14* 3.36* 3.52*  CALCIUM 9.2 9.1 9.1   No results for input(s): LABPT, INR in the last 72 hours. No results for input(s): LABURIN in the last 72 hours. Results for orders placed or performed during the hospital encounter of 06/14/2019  Urine culture     Status: Abnormal   Collection Time: 06/22/2019  3:37 PM   Specimen: Urine, Random  Result Value Ref Range Status   Specimen Description   Final    URINE, RANDOM Performed at [facility] [address] [place], [place] [address]    Special Requests   Final    NONE Performed at [facility], [place] [address] [place], [place] [address]    Culture MULTIPLE SPECIES PRESENT, SUGGEST RECOLLECTION (A)  Final   Report Status 06/24/2019 FINAL  Final  Culture, blood (routine x 2)     Status: Abnormal   Collection Time: 06/20/2019  4:56 PM   Specimen: BLOOD  Result Value Ref Range Status   Specimen Description   Final    BLOOD RIGHT ANTECUBITAL Performed at [facility] [address], [place], [place] [address]    Special Requests   Final    BOTTLES DRAWN AEROBIC ONLY Blood Culture results may not be optimal due to an excessive volume of blood received in culture bottles Performed at [facility] [address], [place], [place] [address]    Culture  Setup Time   Final    AEROBIC BOTTLE ONLY GRAM  POSITIVE COCCI IN CHAINS CRITICAL RESULT CALLED TO, READ BACK BY AND VERIFIED WITH: Lavell Luster [facility] 07/14/2019 0032 JDW Performed at [facility], [place] [address], [place], [place] [address]    Culture ENTEROCOCCUS FAECALIS (  A)  Final   Report Status 06/26/2019 FINAL  Final   Organism ID, Bacteria ENTEROCOCCUS FAECALIS  Final      Susceptibility   Enterococcus faecalis - MIC*    AMPICILLIN <=2 SENSITIVE Sensitive     VANCOMYCIN 1 SENSITIVE Sensitive     GENTAMICIN SYNERGY SENSITIVE Sensitive     * ENTEROCOCCUS FAECALIS  Blood Culture ID Panel (Reflexed)     Status: Abnormal   Collection Time: 06/22/2019  4:56 PM  Result Value Ref Range Status   Enterococcus species DETECTED (A) NOT DETECTED Final    Comment: CRITICAL RESULT CALLED TO, READ BACK BY AND VERIFIED WITH: J. GRIMSLEY PHARMD 06/24/2019 0032 JDW    Vancomycin resistance NOT DETECTED NOT DETECTED Final   Listeria monocytogenes NOT DETECTED NOT DETECTED Final   Staphylococcus species NOT DETECTED NOT DETECTED Final   Staphylococcus aureus (BCID) NOT DETECTED NOT DETECTED Final   Streptococcus species NOT DETECTED NOT DETECTED Final   Streptococcus agalactiae NOT DETECTED NOT DETECTED Final   Streptococcus pneumoniae NOT DETECTED NOT DETECTED Final   Streptococcus pyogenes NOT DETECTED NOT DETECTED Final   Acinetobacter baumannii NOT DETECTED NOT DETECTED Final   Enterobacteriaceae species NOT DETECTED NOT DETECTED Final   Enterobacter cloacae complex NOT DETECTED NOT DETECTED Final   Escherichia coli NOT DETECTED NOT DETECTED Final   Klebsiella oxytoca NOT DETECTED NOT DETECTED Final   Klebsiella pneumoniae NOT DETECTED NOT DETECTED Final   Proteus species NOT DETECTED NOT DETECTED Final   Serratia marcescens NOT DETECTED NOT DETECTED Final   Haemophilus influenzae NOT DETECTED NOT DETECTED Final   Neisseria meningitidis NOT DETECTED NOT DETECTED Final   Pseudomonas aeruginosa NOT DETECTED NOT DETECTED Final   Candida  albicans NOT DETECTED NOT DETECTED Final   Candida glabrata NOT DETECTED NOT DETECTED Final   Candida krusei NOT DETECTED NOT DETECTED Final   Candida parapsilosis NOT DETECTED NOT DETECTED Final   Candida tropicalis NOT DETECTED NOT DETECTED Final    Comment: Performed at [facility], [address] [place], [place] [address]  Culture, blood (routine x 2)     Status: Abnormal   Collection Time: 06/25/2019  5:16 PM   Specimen: BLOOD  Result Value Ref Range Status   Specimen Description   Final    BLOOD RIGHT ANTECUBITAL Performed at [facility] [address] [place], [place] [address]    Special Requests   Final    BOTTLES DRAWN AEROBIC AND ANAEROBIC Blood Culture adequate volume Performed at [facility] [address] [place], [place] [address]    Culture  Setup Time   Final    GRAM POSITIVE COCCI IN CHAINS IN BOTH AEROBIC AND ANAEROBIC BOTTLES CRITICAL RESULT CALLED TO, READ BACK BY AND VERIFIED WITH: PHARMD M RENZ [id number]    Culture (A)  Final    ENTEROCOCCUS FAECALIS SUSCEPTIBILITIES PERFORMED ON PREVIOUS CULTURE WITHIN THE LAST 5 DAYS. Performed at [facility], [place] [address], [place], [place] [address]    Report Status 06/26/2019 FINAL  Final  SARS CORONAVIRUS 2 (TAT 6-24 HRS) Nasopharyngeal Nasopharyngeal Swab     Status: None   Collection Time: 06/25/2019  5:20 PM   Specimen: Nasopharyngeal Swab  Result Value Ref Range Status   SARS Coronavirus 2 NEGATIVE NEGATIVE Final    Comment: (NOTE) SARS-CoV-2 target nucleic acids are NOT DETECTED. The SARS-CoV-2 RNA is generally detectable in upper and lower respiratory specimens during the acute phase of infection.  Negative results do not preclude SARS-CoV-2 infection, do not rule out co-infections with other pathogens, and should not be used as the sole basis for treatment or other patient management decisions. Negative results must be combined with clinical  observations, patient history, and epidemiological information. The expected result is Negative. Fact Sheet for Patients: SugarRoll.be Fact Sheet for Healthcare Providers: https://www.woods-mathews.com/ This test is not yet approved or cleared by the [place] FDA and  has been authorized for detection and/or diagnosis of SARS-CoV-2 by FDA under an Emergency Use Authorization (EUA). This EUA will remain  in effect (meaning this test can be used) for the duration of the COVID-19 declaration under Section 56 4(b)(1) of the Act, 21 U.S.C. section 360bbb-3(b)(1), unless the authorization is terminated or revoked sooner. Performed at [facility], [place] [address], [place], [place] [address]   MRSA PCR Screening     Status: None   Collection Time: 06/24/19  1:08 AM   Specimen: Nasal Mucosa; Nasopharyngeal  Result Value Ref Range Status   MRSA by PCR NEGATIVE NEGATIVE Final    Comment:        The GeneXpert MRSA Assay (FDA approved for NASAL specimens only), is one component of a comprehensive MRSA colonization surveillance program. It is not intended to diagnose MRSA infection nor to guide or monitor treatment for MRSA infections. Performed at [facility], [place] [address], [place], [place] [address]   Culture, blood (routine x 2)     Status: None (Preliminary result)   Collection Time: 06/26/19  2:10 AM   Specimen: BLOOD  Result Value Ref Range Status   Specimen Description   Final    BLOOD RIGHT ARM Performed at [facility] [address] [place], [place] [address]    Special Requests   Final    BOTTLES DRAWN AEROBIC ONLY Blood Culture adequate volume Performed at [facility] [address] [place], [place] [address]    Culture   Final    NO GROWTH 3 DAYS Performed at [facility], [place] [address], [place], [place] [address]    Report Status PENDING   Incomplete  Culture, blood (routine x 2)     Status: None (Preliminary result)   Collection Time: 06/26/19  2:10 AM   Specimen: BLOOD  Result Value Ref Range Status   Specimen Description   Final    BLOOD RIGHT HAND Performed at [facility] [address], [place], [place] [address]    Special Requests   Final    BOTTLES DRAWN AEROBIC ONLY Blood Culture adequate volume Performed at [facility] [address] [place], [place] [address]    Culture   Final    NO GROWTH 3 DAYS Performed at [facility], [place] [address] [place], [place] [address]    Report Status PENDING  Incomplete    Studies/Results: CT ABDOMEN PELVIS WO CONTRAST  Result Date: 06/29/2019 CLINICAL DATA:  Stage IV prostate carcinoma. Chronic renal disease. Generalized weakness. Painful urination. Foley catheter placed upon admission. Anemia. EXAM: CT CHEST, ABDOMEN AND PELVIS WITHOUT CONTRAST TECHNIQUE: Multidetector CT imaging of the chest, abdomen and pelvis was performed following the standard protocol without IV contrast. COMPARISON:  CT 07/03/2019, bone scan 12/23/2018 CT FINDINGS: CT CHEST FINDINGS Cardiovascular: No significant vascular findings. Normal heart size. No pericardial effusion. Mediastinum/Nodes: No axillary supraclavicular adenopathy. No mediastinal hilar adenopathy. No pericardial effusion Lungs/Pleura: Bilateral pleural effusions slightly increased from prior  exam. There is a bibasilar atelectasis also mildly increased in LEFT lower lobe. No suspicious pulmonary nodules. 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 Several lesions are expansile within the ribs. For example the LEFT 5th rib (image 60/6) CT ABDOMEN AND PELVIS FINDINGS Hepatobiliary: No focal hepatic lesion on noncontrast exam. Gallstone within neck of the gallbladder. No gallbladder distension Pancreas: Pancreas is normal. No ductal dilatation. No  pancreatic inflammation. Spleen: Normal spleen Adrenals/urinary tract: Adrenal glands are normal. There is bilateral severe hydronephrosis and hydroureter. Hydroureter extends to the urinary bladder. Findings are similar to comparison CT 06/22/2019. There is interval placement of a Foley catheter. The previous described nodule thickening the bladder is not well appreciated. The bladder is thick-walled and collapsed. Stomach/Bowel: Stomach, small-bowel and cecum are normal. The appendix is not identified but there is no pericecal inflammation to suggest appendicitis. The colon and rectosigmoid colon are normal. Rectal thermometer noted Vascular/Lymphatic: Abdominal aorta is normal caliber with atherosclerotic calcification. There is no retroperitoneal or periportal lymphadenopathy. No pelvic lymphadenopathy. Reproductive: Prostate normal Other: No free fluid. Musculoskeletal: Widespread sclerotic skeletal metastasis unchanged. Pathologic fracture identified. IMPRESSION: Chest Impression: 1. No evidence of thoracic metastasis. 2. Interval increase in moderate effusions and basilar atelectasis. 3. Widespread skeletal metastasis Abdomen / Pelvis Impression: 1. Persistent bilateral hydronephrosis and hydroureter to the level of the bladder. Interval placement Foley catheter within thickened high-density bladder. 2. No inflammation or infection identified in the abdomen pelvis. 3. No source of hemorrhage other than high-density thickening within the bladder. Electronically Signed   By: Suzy Bouchard M.D.   On: 06/29/2019 13:05   CT CHEST WO CONTRAST  Result Date: 06/29/2019 CLINICAL DATA:  Stage IV prostate carcinoma. Chronic renal disease. Generalized weakness. Painful urination. Foley catheter placed upon admission. Anemia. EXAM: CT CHEST, ABDOMEN AND PELVIS WITHOUT CONTRAST TECHNIQUE: Multidetector CT imaging of the chest, abdomen and pelvis was performed following the standard protocol without IV contrast.  COMPARISON:  CT 07/13/2019, bone scan 12/23/2018 CT FINDINGS: CT CHEST FINDINGS Cardiovascular: No significant vascular findings. Normal heart size. No pericardial effusion. Mediastinum/Nodes: No axillary supraclavicular adenopathy. No mediastinal hilar adenopathy. No pericardial effusion Lungs/Pleura: Bilateral pleural effusions slightly increased from prior exam. There is a bibasilar atelectasis also mildly increased in LEFT lower lobe. No suspicious pulmonary nodules. 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 Several lesions are expansile within the ribs. For example the LEFT 5th rib (image 60/6) CT ABDOMEN AND PELVIS FINDINGS Hepatobiliary: No focal hepatic lesion on noncontrast exam. Gallstone within neck of the gallbladder. No gallbladder distension Pancreas: Pancreas is normal. No ductal dilatation. No pancreatic inflammation. Spleen: Normal spleen Adrenals/urinary tract: Adrenal glands are normal. There is bilateral severe hydronephrosis and hydroureter. Hydroureter extends to the urinary bladder. Findings are similar to comparison CT 06/14/2019. There is interval placement of a Foley catheter. The previous described nodule thickening the bladder is not well appreciated. The bladder is thick-walled and collapsed. Stomach/Bowel: Stomach, small-bowel and cecum are normal. The appendix is not identified but there is no pericecal inflammation to suggest appendicitis. The colon and rectosigmoid colon are normal. Rectal thermometer noted Vascular/Lymphatic: Abdominal aorta is normal caliber with atherosclerotic calcification. There is no retroperitoneal or periportal lymphadenopathy. No pelvic lymphadenopathy. Reproductive: Prostate normal Other: No free fluid. Musculoskeletal: Widespread sclerotic skeletal metastasis unchanged. Pathologic fracture identified. IMPRESSION: Chest Impression: 1. No evidence of thoracic metastasis. 2. Interval increase in moderate effusions  and basilar atelectasis. 3. Widespread skeletal metastasis Abdomen / Pelvis  Impression: 1. Persistent bilateral hydronephrosis and hydroureter to the level of the bladder. Interval placement Foley catheter within thickened high-density bladder. 2. No inflammation or infection identified in the abdomen pelvis. 3. No source of hemorrhage other than high-density thickening within the bladder. Electronically Signed   By: Suzy Bouchard M.D.   On: 06/29/2019 13:05   VAS [place] LOWER EXTREMITY VENOUS (DVT)  Result Date: 06/28/2019  Lower Venous DVTStudy Indications: Swelling, Edema, and Pain.  Risk Factors: Cancer prostate. Limitations: Poor ultrasound/tissue interface. Comparison Study: No prior exam. Performing Technologist: Baldwin Crown ARDMS, RVT  Examination Guidelines: A complete evaluation includes B-mode imaging, spectral Doppler, color Doppler, and power Doppler as needed of all accessible portions of each vessel. Bilateral testing is considered an integral part of a complete examination. Limited examinations for reoccurring indications may be performed as noted. The reflux portion of the exam is performed with the patient in reverse Trendelenburg.  +---------+---------------+---------+-----------+----------+------------------+ RIGHT    CompressibilityPhasicitySpontaneityPropertiesThrombus Aging     +---------+---------------+---------+-----------+----------+------------------+ CFV      Full           Yes      Yes                                     +---------+---------------+---------+-----------+----------+------------------+ SFJ      Full                                                            +---------+---------------+---------+-----------+----------+------------------+ FV Prox  Full                                                            +---------+---------------+---------+-----------+----------+------------------+ FV Mid   Full                                                             +---------+---------------+---------+-----------+----------+------------------+ FV DistalFull                                                            +---------+---------------+---------+-----------+----------+------------------+ PFV      Full                                                            +---------+---------------+---------+-----------+----------+------------------+ POP      Full           Yes      Yes                                     +---------+---------------+---------+-----------+----------+------------------+  PTV      Full                                         visualized with                                                          color              +---------+---------------+---------+-----------+----------+------------------+ PERO     Full                                         visualized with                                                          color              +---------+---------------+---------+-----------+----------+------------------+ Hypoechoic, oval structure with echogenic center visualized in right groin measuring 4.3 x 1.4 x 2.3.  +---------+---------------+---------+-----------+----------+------------------+ LEFT     CompressibilityPhasicitySpontaneityPropertiesThrombus Aging     +---------+---------------+---------+-----------+----------+------------------+ CFV      Full           Yes      Yes                                     +---------+---------------+---------+-----------+----------+------------------+ SFJ      Full                                                            +---------+---------------+---------+-----------+----------+------------------+ FV Prox  Full                                                            +---------+---------------+---------+-----------+----------+------------------+ FV Mid   Full                                                             +---------+---------------+---------+-----------+----------+------------------+ FV DistalFull                                                            +---------+---------------+---------+-----------+----------+------------------+ PFV      Full                                                            +---------+---------------+---------+-----------+----------+------------------+  POP      Full           Yes      Yes                                     +---------+---------------+---------+-----------+----------+------------------+ PTV      Full                                         visualized with                                                          color              +---------+---------------+---------+-----------+----------+------------------+ PERO     Full                                         visualized with                                                          color              +---------+---------------+---------+-----------+----------+------------------+     Summary: RIGHT: - There is no evidence of deep vein thrombosis in the lower extremity. However, portions of this examination were limited- see technologist comments above.  - No cystic structure found in the popliteal fossa. - Hypoechoic, oval structure with echogenic center visualized in right groin measuring 4.3 x 1.4 x 2.3.  LEFT: - There is no evidence of deep vein thrombosis in the lower extremity. However, portions of this examination were limited- see technologist comments above.  - No cystic structure found in the popliteal fossa.  *See table(s) above for measurements and observations. Electronically signed by Ruta Hinds MD on 06/28/2019 at 7:06:18 PM.    Final     Assessment:  bilateral hydronephrosis secondary to obstruction from advanced prostate cancer  metastatic prostate cancer  sepsis, likely secondary to urinary tract infection   Acute on chronic  stage III renal insufficiency  Plan:  I talked to him again about bilateral nephrostomy tubes. He is making good urine but has persistent severe hydronephrosis   And creatinine is worsening.  Repeat CT scan is ordered for today.  We will see if there is any change in hydronephrosis after he has had a Foley catheter for  Several days.  I think without nephrostomy tubes, his renal function will slowly continue to worsen.  Also told him that I think nephrostomy tubes would drain his urinary system better in the event that this is hindering him from completely clearing his infection.  He remains undecided.  Please call with any questions or concerns or if he decides that he wants to proceed with nephrostomy tubes.  I can work with interventional radiology to help set this up   If he decides to go this direction.  For now, he is declining and palliative care is on board  And it is certainly reasonable to decline  In his current state of health.Link Snuffer, MD Urology 06/29/2019, 4:16 PM

## 2019-06-29 NOTE — Progress Notes (Signed)
Subjective: No new complaints   Antibiotics:  Anti-infectives (From admission, onward)   Start     Dose/Rate Route Frequency Ordered Stop   06/27/19 1800  cefTRIAXone (ROCEPHIN) 2 g in sodium chloride 0.9 % 100 mL IVPB  Status:  Discontinued     2 g 200 mL/hr over 30 Minutes Intravenous Every 24 hours 06/27/19 1635 06/28/19 1426   07/01/2019 1400  ampicillin (OMNIPEN) 2 g in sodium chloride 0.9 % 100 mL IVPB     2 g 300 mL/hr over 20 Minutes Intravenous Every 8 hours 06/22/2019 0612     06/18/2019 0115  ampicillin (OMNIPEN) 2 g in sodium chloride 0.9 % 100 mL IVPB  Status:  Discontinued     2 g 300 mL/hr over 20 Minutes Intravenous Every 6 hours 07/12/2019 0107 06/26/2019 0611   06/24/19 0800  cefTRIAXone (ROCEPHIN) 2 g in sodium chloride 0.9 % 100 mL IVPB  Status:  Discontinued     2 g 200 mL/hr over 30 Minutes Intravenous Every 24 hours 06/24/19 0722 06/26/2019 0104   07/03/2019 1700  cefTRIAXone (ROCEPHIN) 1 g in sodium chloride 0.9 % 100 mL IVPB     1 g 200 mL/hr over 30 Minutes Intravenous  Once 06/15/2019 1652 06/24/2019 1949      Medications: Scheduled Meds: . Chlorhexidine Gluconate Cloth  6 each Topical Daily  . feeding supplement (ENSURE ENLIVE)  237 mL Oral BID BM  . gabapentin  100 mg Oral BID  . insulin aspart  0-9 Units Subcutaneous Q6H  . mouth rinse  15 mL Mouth Rinse BID  . metoprolol tartrate  12.5 mg Oral BID  . pantoprazole  40 mg Oral Q0600   Continuous Infusions: . ampicillin (OMNIPEN) IV Stopped (06/29/19 0551)   PRN Meds:.acetaminophen **OR** acetaminophen, fentaNYL (SUBLIMAZE) injection, metoprolol tartrate, ondansetron **OR** ondansetron (ZOFRAN) IV, oxyCODONE, sodium chloride    Objective: Weight change: 0.1 kg  Intake/Output Summary (Last 24 hours) at 06/29/2019 1031 Last data filed at 06/29/2019 0900 Gross per 24 hour  Intake 1760 ml  Output 4000 ml  Net -2240 ml   Blood pressure 125/76, pulse (!) 110, temperature 98.7 F (37.1 C),  temperature source Rectal, resp. rate 18, height 5\' 9"  (1.753 m), weight 99.9 kg, SpO2 99 %. Temp:  [98.6 F (37 C)-103.4 F (39.7 C)] 98.7 F (37.1 C) (03/16 1000) Pulse Rate:  [110-142] 110 (03/16 1015) Resp:  [14-36] 18 (03/16 1015) BP: (80-127)/(43-93) 125/76 (03/16 1000) SpO2:  [91 %-100 %] 99 % (03/16 1015) Weight:  [99.9 kg] 99.9 kg (03/16 0500)  Physical Exam: General: alert and oriented HEENT: anicteric sclera, EOMI CVS tachycardic   Chest: , no wheezing, no respiratory distress Abdomen: soft non-distended,  Extremities: edematous Skin: no rashes Neuro: nonfocal  CBC:    BMET Recent Labs    06/28/19 0316 06/29/19 0212  NA 145 148*  K 3.6 3.5  CL 110 112*  CO2 22 24  GLUCOSE 124* 129*  BUN 39* 44*  CREATININE 3.36* 3.52*  CALCIUM 9.1 9.1     Liver Panel  Recent Labs    06/27/19 0317  PROT 5.9*  ALBUMIN 1.7*  AST 40  ALT 26  ALKPHOS 158*  BILITOT 0.8       Sedimentation Rate No results for input(s): ESRSEDRATE in the last 72 hours. C-Reactive Protein No results for input(s): CRP in the last 72 hours.  Micro Results: Recent Results (from the past 720 hour(s))  Urine culture  Status: Abnormal   Collection Time: 07/09/2019  3:37 PM   Specimen: Urine, Random  Result Value Ref Range Status   Specimen Description   Final    URINE, RANDOM Performed at [facility] [address] [place], [place] [address]    Special Requests   Final    NONE Performed at [facility], [place] [address], [place], [place] [address]    Culture MULTIPLE SPECIES PRESENT, SUGGEST RECOLLECTION (A)  Final   Report Status 06/24/2019 FINAL  Final  Culture, blood (routine x 2)     Status: Abnormal   Collection Time: 06/18/2019  4:56 PM   Specimen: BLOOD  Result Value Ref Range Status   Specimen Description   Final    BLOOD RIGHT ANTECUBITAL Performed at [facility] [address] [place], [place]  [address]    Special Requests   Final    BOTTLES DRAWN AEROBIC ONLY Blood Culture results may not be optimal due to an excessive volume of blood received in culture bottles Performed at [facility] [address], [place], [place] [address]    Culture  Setup Time   Final    AEROBIC BOTTLE ONLY GRAM POSITIVE COCCI IN CHAINS CRITICAL RESULT CALLED TO, READ BACK BY AND VERIFIED WITH: Lavell Luster [facility] 07/01/2019 0032 JDW Performed at [facility], [place] [address] [place], [place] [address]    Culture ENTEROCOCCUS FAECALIS (A)  Final   Report Status 06/26/2019 FINAL  Final   Organism ID, Bacteria ENTEROCOCCUS FAECALIS  Final      Susceptibility   Enterococcus faecalis - MIC*    AMPICILLIN <=2 SENSITIVE Sensitive     VANCOMYCIN 1 SENSITIVE Sensitive     GENTAMICIN SYNERGY SENSITIVE Sensitive     * ENTEROCOCCUS FAECALIS  Blood Culture ID Panel (Reflexed)     Status: Abnormal   Collection Time: 06/22/2019  4:56 PM  Result Value Ref Range Status   Enterococcus species DETECTED (A) NOT DETECTED Final    Comment: CRITICAL RESULT CALLED TO, READ BACK BY AND VERIFIED WITH: J. GRIMSLEY PHARMD 07/07/2019 0032 JDW    Vancomycin resistance NOT DETECTED NOT DETECTED Final   Listeria monocytogenes NOT DETECTED NOT DETECTED Final   Staphylococcus species NOT DETECTED NOT DETECTED Final   Staphylococcus aureus (BCID) NOT DETECTED NOT DETECTED Final   Streptococcus species NOT DETECTED NOT DETECTED Final   Streptococcus agalactiae NOT DETECTED NOT DETECTED Final   Streptococcus pneumoniae NOT DETECTED NOT DETECTED Final   Streptococcus pyogenes NOT DETECTED NOT DETECTED Final   Acinetobacter baumannii NOT DETECTED NOT DETECTED Final   Enterobacteriaceae species NOT DETECTED NOT DETECTED Final   Enterobacter cloacae complex NOT DETECTED NOT DETECTED Final   Escherichia coli NOT DETECTED NOT DETECTED Final   Klebsiella oxytoca NOT DETECTED NOT DETECTED Final   Klebsiella pneumoniae  NOT DETECTED NOT DETECTED Final   Proteus species NOT DETECTED NOT DETECTED Final   Serratia marcescens NOT DETECTED NOT DETECTED Final   Haemophilus influenzae NOT DETECTED NOT DETECTED Final   Neisseria meningitidis NOT DETECTED NOT DETECTED Final   Pseudomonas aeruginosa NOT DETECTED NOT DETECTED Final   Candida albicans NOT DETECTED NOT DETECTED Final   Candida glabrata NOT DETECTED NOT DETECTED Final   Candida krusei NOT DETECTED NOT DETECTED Final   Candida parapsilosis NOT DETECTED NOT DETECTED Final   Candida tropicalis NOT DETECTED NOT DETECTED Final    Comment: Performed at [facility], [place]  [address] [place], [place] [address]  Culture, blood (routine x 2)     Status: Abnormal   Collection Time: 06/30/2019  5:16 PM   Specimen: BLOOD  Result Value Ref Range Status   Specimen Description   Final    BLOOD RIGHT ANTECUBITAL Performed at [facility] [address] [place], [place] [address]    Special Requests   Final    BOTTLES DRAWN AEROBIC AND ANAEROBIC Blood Culture adequate volume Performed at [facility] [address], [place], [place] [address]    Culture  Setup Time   Final    GRAM POSITIVE COCCI IN CHAINS IN BOTH AEROBIC AND ANAEROBIC BOTTLES CRITICAL RESULT CALLED TO, READ BACK BY AND VERIFIED WITH: PHARMD M RENZ [id number] MLM    Culture (A)  Final    ENTEROCOCCUS FAECALIS SUSCEPTIBILITIES PERFORMED ON PREVIOUS CULTURE WITHIN THE LAST 5 DAYS. Performed at [facility], [place] [address], [place], [place] [address]    Report Status 06/26/2019 FINAL  Final  SARS CORONAVIRUS 2 (TAT 6-24 HRS) Nasopharyngeal Nasopharyngeal Swab     Status: None   Collection Time: 07/11/2019  5:20 PM   Specimen: Nasopharyngeal Swab  Result Value Ref Range Status   SARS Coronavirus 2 NEGATIVE NEGATIVE Final    Comment: (NOTE) SARS-CoV-2 target nucleic acids are NOT DETECTED. The SARS-CoV-2 RNA is generally detectable in upper and  lower respiratory specimens during the acute phase of infection. Negative results do not preclude SARS-CoV-2 infection, do not rule out co-infections with other pathogens, and should not be used as the sole basis for treatment or other patient management decisions. Negative results must be combined with clinical observations, patient history, and epidemiological information. The expected result is Negative. Fact Sheet for Patients: SugarRoll.be Fact Sheet for Healthcare Providers: https://www.woods-mathews.com/ This test is not yet approved or cleared by the [place] FDA and  has been authorized for detection and/or diagnosis of SARS-CoV-2 by FDA under an Emergency Use Authorization (EUA). This EUA will remain  in effect (meaning this test can be used) for the duration of the COVID-19 declaration under Section 56 4(b)(1) of the Act, 21 U.S.C. section 360bbb-3(b)(1), unless the authorization is terminated or revoked sooner. Performed at [facility], [place] [address] [place], [place] [address]   MRSA PCR Screening     Status: None   Collection Time: 06/24/19  1:08 AM   Specimen: Nasal Mucosa; Nasopharyngeal  Result Value Ref Range Status   MRSA by PCR NEGATIVE NEGATIVE Final    Comment:        The GeneXpert MRSA Assay (FDA approved for NASAL specimens only), is one component of a comprehensive MRSA colonization surveillance program. It is not intended to diagnose MRSA infection nor to guide or monitor treatment for MRSA infections. Performed at [facility], [place] [address], [place], [place] [address]   Culture, blood (routine x 2)     Status: None (Preliminary result)   Collection Time: 06/26/19  2:10 AM   Specimen: BLOOD  Result Value Ref Range Status   Specimen Description   Final    BLOOD RIGHT ARM Performed at [facility] [place] [address] [place], [place] [address]    Special  Requests   Final    BOTTLES DRAWN AEROBIC ONLY Blood Culture adequate volume Performed at [facility] [address], [place], [place] [address]    Culture   Final    NO GROWTH 3  DAYS Performed at [facility], [place] [address] [place], [place] [address]    Report Status PENDING  Incomplete  Culture, blood (routine x 2)     Status: None (Preliminary result)   Collection Time: 06/26/19  2:10 AM   Specimen: BLOOD  Result Value Ref Range Status   Specimen Description   Final    BLOOD RIGHT HAND Performed at [facility] [address], [place], [place] [address]    Special Requests   Final    BOTTLES DRAWN AEROBIC ONLY Blood Culture adequate volume Performed at [facility] [address], [place], [place] [address]    Culture   Final    NO GROWTH 3 DAYS Performed at [facility], [place] [address] [place], [place] [address]    Report Status PENDING  Incomplete    Studies/Results: VAS [place] LOWER EXTREMITY VENOUS (DVT)  Result Date: 06/28/2019  Lower Venous DVTStudy Indications: Swelling, Edema, and Pain.  Risk Factors: Cancer prostate. Limitations: Poor ultrasound/tissue interface. Comparison Study: No prior exam. Performing Technologist: Baldwin Crown ARDMS, RVT  Examination Guidelines: A complete evaluation includes B-mode imaging, spectral Doppler, color Doppler, and power Doppler as needed of all accessible portions of each vessel. Bilateral testing is considered an integral part of a complete examination. Limited examinations for reoccurring indications may be performed as noted. The reflux portion of the exam is performed with the patient in reverse Trendelenburg.  +---------+---------------+---------+-----------+----------+------------------+ RIGHT    CompressibilityPhasicitySpontaneityPropertiesThrombus Aging     +---------+---------------+---------+-----------+----------+------------------+ CFV       Full           Yes      Yes                                     +---------+---------------+---------+-----------+----------+------------------+ SFJ      Full                                                            +---------+---------------+---------+-----------+----------+------------------+ FV Prox  Full                                                            +---------+---------------+---------+-----------+----------+------------------+ FV Mid   Full                                                            +---------+---------------+---------+-----------+----------+------------------+ FV DistalFull                                                            +---------+---------------+---------+-----------+----------+------------------+ PFV      Full                                                            +---------+---------------+---------+-----------+----------+------------------+  POP      Full           Yes      Yes                                     +---------+---------------+---------+-----------+----------+------------------+ PTV      Full                                         visualized with                                                          color              +---------+---------------+---------+-----------+----------+------------------+ PERO     Full                                         visualized with                                                          color              +---------+---------------+---------+-----------+----------+------------------+ Hypoechoic, oval structure with echogenic center visualized in right groin measuring 4.3 x 1.4 x 2.3.  +---------+---------------+---------+-----------+----------+------------------+ LEFT     CompressibilityPhasicitySpontaneityPropertiesThrombus Aging     +---------+---------------+---------+-----------+----------+------------------+ CFV      Full            Yes      Yes                                     +---------+---------------+---------+-----------+----------+------------------+ SFJ      Full                                                            +---------+---------------+---------+-----------+----------+------------------+ FV Prox  Full                                                            +---------+---------------+---------+-----------+----------+------------------+ FV Mid   Full                                                            +---------+---------------+---------+-----------+----------+------------------+ FV DistalFull                                                            +---------+---------------+---------+-----------+----------+------------------+  PFV      Full                                                            +---------+---------------+---------+-----------+----------+------------------+ POP      Full           Yes      Yes                                     +---------+---------------+---------+-----------+----------+------------------+ PTV      Full                                         visualized with                                                          color              +---------+---------------+---------+-----------+----------+------------------+ PERO     Full                                         visualized with                                                          color              +---------+---------------+---------+-----------+----------+------------------+     Summary: RIGHT: - There is no evidence of deep vein thrombosis in the lower extremity. However, portions of this examination were limited- see technologist comments above.  - No cystic structure found in the popliteal fossa. - Hypoechoic, oval structure with echogenic center visualized in right groin measuring 4.3 x 1.4 x 2.3.  LEFT: - There is no evidence of  deep vein thrombosis in the lower extremity. However, portions of this examination were limited- see technologist comments above.  - No cystic structure found in the popliteal fossa.  *See table(s) above for measurements and observations. Electronically signed by Ruta Hinds MD on 06/28/2019 at 7:06:18 PM.    Final       Assessment/Plan:  INTERVAL HISTORY:   Febrile to 103   Principal Problem:   Severe sepsis ([place]) Active Problems:   Urinary retention   AKI (acute kidney injury) ([place])   Cholelithiasis   Diabetes mellitus, new onset ([place])   DM2 (diabetes mellitus, type 2) ([place])   CKD (chronic kidney disease) stage 3, GFR 30-59 ml/min   Prostate cancer (H[facility])   GI bleed   Heme positive stool   Anemia due to chronic blood loss   Anemia   Enterococcal bacteremia    Jeffery [name] is a 59 y.o. male with progressive metastatic prostate cancer bilateral hydronephrosis and  obstructive uropathy with AMP S enterococcal bacteremia, thought to be from urinary source  #1 AMP S E faecalis bacteremia: Likely from urinary source.  Transthoracic echocardiogram does not show evidence of vegetations  If were being aggressive we will obtain a transesophageal echocardiogram.   2. Fevers: would get repeat CT chest, abdomen and pelvis  3.  Progressive metastatic prostate cancer: Agree with goals of care being continue to be discussed and continued conversations with palliative care   LOS: 6 days   Alcide Evener 06/29/2019, 10:31 AM

## 2019-06-29 NOTE — Progress Notes (Addendum)
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   Clinical Impression   Pt reports living home with mother and performing all self-care tasks and functional mobility at PLOF with Modified  Independence. Pt mobilized around home and community with cane. Overall pt requires set-up to Total assist for self-care tasks. Pt required increased time with processing of information and impulsive with functional transfers. Patient required total assist with bowel incontinence episode in bed during supine to sit attempt. Pt was able to tolerate sitting unsupported EOB with SBA-Moderate of trunk support to ensure safety. Pt attempted 1 sit to stand and was able to complete another sit to stand transfer with Mod A x 2 using RW. O2 stats dropped to low 80s on 1L during sidelying for cleaning during toileting, verbal cues for PLB. Pt will benefit from acute skilled services to decrease caregiver assistance upon d/c.     Follow Up Recommendations  Home health OT(versus CIR pending patient's progression in acute setting)    Equipment Recommendations       Recommendations for Other Services       Precautions / Restrictions Precautions Precautions: Fall Precaution Comments: loose BM Restrictions Weight Bearing Restrictions: No      Mobility Bed Mobility Overal bed mobility: Needs Assistance Bed Mobility: Rolling;Supine to Sit;Sit to Supine Rolling: Mod  assist   Supine to sit: +2 for safety/equipment;Max assist Sit to supine: +2 for safety/equipment;Max assist   General bed mobility comments: multimodal cues  for rolling, patient had a large BM. extra time and encouragement to mobilize, assisting with legs and trunk.  Transfers Overall transfer level: Needs assistance Equipment used: Rolling walker (2 wheeled) Transfers: Sit to/from Stand Sit to Stand: Mod assist;+2 physical assistance;+2 safety/equipment         General transfer comment: did take small side steps along th bed edge.    Balance Overall balance assessment: Needs assistance   Sitting balance-Leahy Scale: Fair     Standing balance support: Bilateral upper extremity supported Standing balance-Leahy Scale: Poor                             ADL either performed or assessed with clinical judgement   ADL Overall ADL's : Needs assistance/impaired Eating/Feeding: Set up   Grooming: Wash/dry face;Wash/dry hands;Set up   Upper Body Bathing: Moderate assistance   Lower Body Bathing: Maximal assistance   Upper Body Dressing : Moderate assistance   Lower Body Dressing: Total assistance   Toilet Transfer: Maximal assistance;+2 for physical assistance;+2 for safety/equipment   Toileting- Clothing Manipulation and Hygiene: Total assistance   Tub/ Shower Transfer: Maximal assistance;+2 for physical assistance;+2 for safety/equipment   Functional mobility during ADLs: Moderate assistance;+2 for physical assistance;+2 for safety/equipment;Rolling walker       Vision Baseline Vision/History: No visual deficits       Perception     Praxis      Pertinent Vitals/Pain Pain Assessment: No/denies pain     Hand Dominance Right   Extremity/Trunk Assessment  Lower Extremity Assessment Lower Extremity Assessment: Generalized weakness   Cervical / Trunk Assessment Cervical / Trunk Assessment: Normal   Communication  Communication Communication: Receptive difficulties;Expressive difficulties   Cognition Arousal/Alertness: Awake/alert Behavior During Therapy: WFL for tasks assessed/performed Overall Cognitive Status: Impaired/Different from baseline Area of Impairment: Attention;Safety/judgement;Awareness;Problem solving                   Current Attention Level: Selective     Safety/Judgement: Decreased awareness of deficits;Decreased awareness of safety Awareness: Emergent Problem Solving: Slow processing;Decreased initiation;Requires verbal cues     General Comments       Exercises     Shoulder Instructions      Home Living Family/patient expects to be discharged to:: Private residence Living Arrangements: Parent Available Help at Discharge: Available PRN/intermittently Type of Home: House Home Access: Stairs to enter Technical brewer of Steps: 3 Entrance Stairs-Rails: None Home Layout: One level     Bathroom Shower/Tub: Occupational psychologist: Standard Bathroom Accessibility: Yes How Accessible: Accessible via walker Home Equipment: [place] - single point          Prior Functioning/Environment Level of Independence: Independent with assistive device(s)        Comments: Cane to mobilize with in environment        OT Problem List: Decreased strength;Decreased activity tolerance;Impaired balance (sitting and/or standing);Decreased safety awareness;Decreased knowledge of use of DME or AE;Cardiopulmonary status limiting activity;Decreased cognition      OT Treatment/Interventions:      OT Goals(Current goals can be found in the care plan section) Acute Rehab OT Goals Patient Stated Goal: to go home OT Goal Formulation: With patient Time For Goal Achievement: 07/13/19 Potential to Achieve Goals: Good  OT Frequency: Min 2X/week   Barriers to D/C:            Co-evaluation PT/OT/SLP Co-Evaluation/Treatment: Yes Reason for Co-Treatment: For  patient/therapist safety PT goals addressed during session: Mobility/safety with mobility OT goals addressed during session: ADL's and self-care      AM-PAC OT "6 Clicks" Daily Activity     Outcome Measure Help from another person eating meals?: A Little Help from another person taking care of personal grooming?: A Little Help from another person toileting, which includes using toliet, bedpan, or urinal?: Total Help from another person bathing (including washing, rinsing, drying)?: A Lot Help from another person to put on and taking off regular upper body clothing?: A Lot Help from another person to put on and taking off regular lower body clothing?: Total 6 Click Score: 12   End of Session Equipment Utilized During Treatment: Gait belt;Rolling walker Nurse Communication: Mobility status  Activity Tolerance: Patient limited by fatigue Patient left: in bed;with call bell/phone within reach;with bed alarm set  OT Visit Diagnosis: Unsteadiness on feet (R26.81);Muscle weakness (generalized) (M62.81)                Time: 9798-9211 OT Time Calculation (min): 51 min Charges:  OT General Charges $OT Visit: 1 Visit OT Evaluation $OT Eval Moderate Complexity: 1 Mod OT Treatments $Self Care/Home Management : 8-22 mins  Delayla Hoffmaster OTR/L   Nance Mccombs 06/29/2019, 4:28 PM

## 2019-06-29 NOTE — Plan of Care (Signed)
Patient resting in bed, states he feels fine, cooling blanket still on, temp decreasing now at 100.52F rectal, BP 82/50 paged Dr [place] Crane with findings. Set BP monitoring for every 15 mins. Will continue to monitor.  Problem: Education: Goal: Knowledge of General Education information will improve Description: Including pain rating scale, medication(s)/side effects and non-pharmacologic comfort measures Outcome: Progressing   Problem: Health Behavior/Discharge Planning: Goal: Ability to manage health-related needs will improve Outcome: Progressing   Problem: Clinical Measurements: Goal: Ability to maintain clinical measurements within normal limits will improve Outcome: Progressing Goal: Will remain free from infection Outcome: Progressing Goal: Diagnostic test results will improve Outcome: Progressing Goal: Respiratory complications will improve Outcome: Progressing Goal: Cardiovascular complication will be avoided Outcome: Progressing   Problem: Activity: Goal: Risk for activity intolerance will decrease Outcome: Progressing   Problem: Nutrition: Goal: Adequate nutrition will be maintained Outcome: Progressing   Problem: Coping: Goal: Level of anxiety will decrease Outcome: Progressing   Problem: Elimination: Goal: Will not experience complications related to bowel motility Outcome: Progressing Goal: Will not experience complications related to urinary retention Outcome: Progressing   Problem: Pain Managment: Goal: General experience of comfort will improve Outcome: Progressing   Problem: Safety: Goal: Ability to remain free from injury will improve Outcome: Progressing   Problem: Skin Integrity: Goal: Risk for impaired skin integrity will decrease Outcome: Progressing

## 2019-06-29 NOTE — Progress Notes (Signed)
PROGRESS NOTE  Jeffery [name] SHF:[id number] DOB: 1960-06-26 DOA: 07/04/2019 PCP: [name], MD  HPI/Recap of past 24 hours: Patient is a 59 year old gentleman with PMH of stage IV prostate cancer with mets to bones, type II diabetes, polyneuropathy, hyperlipidemia, chronic kidney disease stage IIIB, anemia of chronic disease, chronic guaiac positive stools who presented to the emergency room with generalized weakness, hypoxia and tachycardia, painful urination.   He was working with therapies at home, he was found shaky and tremulous, oncology office was called who directed him to the ER.  Febrile tachycardic in the ED.  Hemoglobin 6.4.  Transfused 2 unit PRBCs.  Creatinine up to 3.54.   CT renal 3/10 showed invasive prostate cancer into the bladder with severe bilateral hydronephrosis.  A Foley catheter was inserted.  TRH was asked to admit.  Intermittent high grade fevers, suspected hematuria with Hg drop down to 7.1.  Transfused 1U PRBC on 3/14, Hg up to 8.8 post RBC transfusion.  Received IV lasix due to volume overload with acute hypoxia.  Responded well with improvement of hypoxia. IV lasix dced on 3/15 due to rise in creatinine and soft BPs.  ID, nephrology, and palliative medicine team assisting with the management.  06/29/19:  Seen and examined.  Gabapentin dose reduced.  More alert and interactive.  Asking for water.  States he prefers liquids to solid foods but agrees to increase his oral protein calorie intake.   Assessment/Plan: Principal Problem:   Severe sepsis (HCC) Active Problems:   Urinary retention   AKI (acute kidney injury) ([place])   Cholelithiasis   Diabetes mellitus, new onset ([place])   DM2 (diabetes mellitus, type 2) (HCC)   CKD (chronic kidney disease) stage 3, GFR 30-59 ml/min   Prostate cancer (HCC)   GI bleed   Heme positive stool   Anemia due to chronic blood loss   Anemia   Enterococcal bacteremia   Palliative care by specialist   Goals of  care, counseling/discussion  Sepsis, present on admission suspect from urinary source, Enterococcus bacteremia. Intermittent high-grade fevers with T-max 102.1 this morning Tachycardic with rates between 115 and 130s Maintain MAP greater than 65 IV fluid stopped due to suspected pulmonary edema on CXR and hypoxia Blood cultures x2 grew Enterococcus faecalis, sensitive to ampicillin Repeated blood cultures negative to date, continue to follow cultures until finalized. Ucx multiple species present, suggesting recollection CT chest, abdomen and pelvis completed on 06/29/2019 showing persistent bilateral hydronephrosis and hydroureter to the level of the bladder.  No source of hemorrhage other than high density thickening within the bladder.  No inflammation no infection identified in the abdomen and pelvis.  Enterococcus bacteremia 2/2 bottles Management as per above Procalcitonin is 8.4 Repeated blood cultures on 06/26/2019 negative to date.  Continue to follow cultures TTE shows no evidence of endocarditis Colonoscopy planned once hemodynamically stable Infectious disease has been consulted and following. Cardiology consult for possible TEE. Possibly on Thursday, 07/01/19.  NPO after midnight.  Acute hypoxic respiratory failure secondary to suspected pulmonary edema/bilateral pleural effusion Not on oxygen supplementation at baseline O2 saturation 95% on 1 L after receiving IV diuretics Continue to maintain O2 saturation greater than 92%  Suspected CAP Independently reviewed CXR showing increase in pulmonary vascularity with RLL opacity.  CAP could not be excluded Monitor fever curve and WBC Obtain procalcitonin in the morning  Severe protein calorie malnutrition Albumin 1.7 BMI 32 with loss of muscle mass Poor oral intake, encourage increase in protein calorie intake  Dietitian consulted, appreciate recommendations.  Refractory sinus tachycardia likely multifactorial 2/2 to sepsis  versus fever versus other ST on 12 lead EKG Intermittent fevers Continue po lopressor 12.5 mg BID IV lopressor 2.5 mg q4H prn with HR>130 when MAP>70  Resolved prolonged QTC 12 lead EKG on 3/12 QTC >540 Repeated 12 lead EKG done on 3/14 QTc 353  Left lower extremity pain, DVT ruled out. Chronic left lower extremity weakness with worsening pain Bilateral Doppler ultrasound negative for DVT.  Acute kidney injury on chronic kidney disease stage IIIB likely postrenal versus prerenal 2/2 to obstructive uropathy from prostate cancer versus intravascular volume depletion:  Baseline creatinine appears to be 2.0 with GFR of 36 Presented with creatinine up to 3.8 Creatinine slowly trending up to 3.3> 3.52. Continue to avoid nephrotoxins and hypotension Nephrology following, appreciate assistance. Continue Foley catheter #5  Resolved Hypokalemia K+ 3.2, repeated 3.5>> 3.6> 3.5.  Hyperphosphatemia in the setting of acute renal failure Phosphorous 5.1 Defer to nephrology  Anemia of chronic disease, positive FOBT and hematuria:  Received 2 units of PRBC.  Hg 7.2>>7.6> 7.1 Transfused 1 unit PRBC on 06/27/2019 Hemoglobin 8.8>> 7.5 Continue PPI Continue to monitor H&H.  Hypernatremia:  Na+ trending up 148 Encourage free water oral intake instead of IV fluids for now  Type 2 diabetes: Controlled.  A1C 6.6 on 06/24/19.  On Metformin at home.  Continue sliding scale.  Metastatic prostate cancer on Zytiga, bladder obstruction, debility and chronic anemia: Followed by his oncologist.  Will have outpatient follow-up after stabilization. Zytiga on hold  Goals of care Palliative care following  DVT prophylaxis: DVT prophylaxis with SCDs Code Status: Full code  Family Communication:  Updated the patient's mother via phone on 06/28/2019.  Disposition Plan: patient is from home. Anticipated DC to home, Barriers to discharge active treatment, IV antibiotics, inpatient  procedures.   Consultants:   Urology  GI  Oncology  Palliative medicine  Nephrology  Dietitian  Procedures:   Foley cath insertion     Objective: Vitals:   06/29/19 1200 06/29/19 1300 06/29/19 1338 06/29/19 1400  BP: 109/72 118/69  123/72  Pulse: (!) 120 (!) 125 (!) 119 (!) 126  Resp: 20 17 20 20   Temp: 98 F (36.7 C) 99.7 F (37.6 C)  97.6 F (36.4 C)  TempSrc: Oral Rectal  Oral  SpO2: 98% 97% 94% 94%  Weight:      Height:        Intake/Output Summary (Last 24 hours) at 06/29/2019 1508 Last data filed at 06/29/2019 1334 Gross per 24 hour  Intake 2494 ml  Output 2700 ml  Net -206 ml   Filed Weights   06/28/19 0500 06/28/19 0515 06/29/19 0500  Weight: 99.8 kg 99.8 kg 99.9 kg    Exam:  . General: 60 y.o. year-old male well-developed well-nourished in no acute distress.  Alert and oriented x3. . Cardiovascular: Tachycardia no rubs or gallops. [name] Respiratory: Clear to auscultation no wheezes or rales.  Good inspiratory effort. .  Abdomen: Obese nontender normal bowel sounds present . Musculoskeletal: Trace lower extremity edema bilaterally. [name] Psychiatry: Mood is appropriate for condition and setting.   Data Reviewed: CBC: Recent Labs  Lab 06/28/2019 1714 06/24/19 0112 06/16/2019 0216 06/27/2019 0216 06/26/19 0210 06/26/19 3016 06/27/19 0317 06/27/19 1930 06/28/19 0316 06/29/19 0212 06/29/19 1054  WBC 11.8*   < > 9.7  --  9.4  --  9.0  --  9.3 10.0  --   NEUTROABS 9.3*  --   --   --  7.4  --  6.8  --  6.6 7.4  --   HGB 6.4*   < > 7.4*   < > 7.0*   < > 7.6* 7.1* 8.8* 7.7* 7.5*  HCT 22.6*   < > 26.4*   < > 25.0*   < > 27.7* 25.1* 31.1* 27.8* 26.0*  MCV 88.6   < > 92.6  --  90.6  --  90.8  --  91.2 93.3  --   PLT 271   < > 249  --  228  --  225  --  229 206  --    < > = values in this interval not displayed.   Basic Metabolic Panel: Recent Labs  Lab 06/20/2019 0216 06/18/2019 0216 06/26/19 0210 06/26/19 0210 06/26/19 7622 06/27/19 6333  06/27/19 1716 06/28/19 0316 06/29/19 0212 06/29/19 1054  NA 154*   < > 145  --   --  148* 146* 145 148*  --   K 4.2   < > 3.2*   < > 3.5 3.6 3.7 3.6 3.5  --   CL 119*   < > 113*  --   --  113* 112* 110 112*  --   CO2 22   < > 21*  --   --  24 23 22 24   --   GLUCOSE 113*   < > 151*  --   --  128* 136* 124* 129*  --   BUN 57*   < > 44*  --   --  41* 37* 39* 44*  --   CREATININE 3.82*   < > 3.32*  --   --  3.20* 3.14* 3.36* 3.52*  --   CALCIUM 8.6*   < > 9.0  --   --  9.4 9.2 9.1 9.1  --   MG 2.1  --  1.9  --   --   --   --  2.0  --   --   PHOS 5.7*  --   --   --   --   --   --   --   --  5.1*   < > = values in this interval not displayed.   GFR: Estimated Creatinine Clearance: 27.3 mL/min (A) (by C-G formula based on SCr of 3.52 mg/dL (H)). Liver Function Tests: Recent Labs  Lab 07/05/2019 1714 06/24/19 0858 06/17/2019 0216 06/26/19 0210 06/27/19 0317  AST 33 28 29 42* 40  ALT 28 23 22 26 26   ALKPHOS 224* 186* 158* 144* 158*  BILITOT 0.8 1.0 0.5 0.5 0.8  PROT 7.3 6.5 6.1* 6.0* 5.9*  ALBUMIN 2.2* 2.0* 2.0* 1.6* 1.7*   No results for input(s): LIPASE, AMYLASE in the last 168 hours. Recent Labs  Lab 06/26/2019 1714  AMMONIA 14   Coagulation Profile: No results for input(s): INR, PROTIME in the last 168 hours. Cardiac Enzymes: No results for input(s): CKTOTAL, CKMB, CKMBINDEX, TROPONINI in the last 168 hours. BNP (last 3 results) No results for input(s): PROBNP in the last 8760 hours. HbA1C: No results for input(s): HGBA1C in the last 72 hours. CBG: Recent Labs  Lab 06/28/19 1131 06/28/19 1757 06/28/19 2323 06/29/19 0523 06/29/19 1129  GLUCAP 121* 141* 196* 141* 239*   Lipid Profile: No results for input(s): CHOL, HDL, LDLCALC, TRIG, CHOLHDL, LDLDIRECT in the last 72 hours. Thyroid Function Tests: No results for input(s): TSH, T4TOTAL, FREET4, T3FREE, THYROIDAB in the last 72 hours. Anemia Panel: No results for input(s): VITAMINB12, FOLATE, FERRITIN,  TIBC, IRON,  RETICCTPCT in the last 72 hours. Urine analysis:    Component Value Date/Time   COLORURINE YELLOW 07/13/2019 1537   APPEARANCEUR CLEAR 07/14/2019 1537   LABSPEC 1.006 06/15/2019 1537   PHURINE 6.0 07/07/2019 1537   GLUCOSEU NEGATIVE 07/07/2019 1537   HGBUR MODERATE (A) 06/17/2019 1537   BILIRUBINUR NEGATIVE 07/07/2019 1537   KETONESUR NEGATIVE 07/11/2019 1537   PROTEINUR 30 (A) 07/06/2019 1537   NITRITE NEGATIVE 07/01/2019 1537   LEUKOCYTESUR LARGE (A) 06/21/2019 1537   Sepsis Labs: @LABRCNTIP (procalcitonin:4,lacticidven:4)  ) Recent Results (from the past 240 hour(s))  Urine culture     Status: Abnormal   Collection Time: 07/06/2019  3:37 PM   Specimen: Urine, Random  Result Value Ref Range Status   Specimen Description   Final    URINE, RANDOM Performed at [facility], [place] [address], [place], [place] [address]    Special Requests   Final    NONE Performed at [facility], [place] [address] [place], [place] [address]    Culture MULTIPLE SPECIES PRESENT, SUGGEST RECOLLECTION (A)  Final   Report Status 06/24/2019 FINAL  Final  Culture, blood (routine x 2)     Status: Abnormal   Collection Time: 06/22/2019  4:56 PM   Specimen: BLOOD  Result Value Ref Range Status   Specimen Description   Final    BLOOD RIGHT ANTECUBITAL Performed at [facility] [address] [place], [place] [address]    Special Requests   Final    BOTTLES DRAWN AEROBIC ONLY Blood Culture results may not be optimal due to an excessive volume of blood received in culture bottles Performed at [facility] [address], [place], [place] [address]    Culture  Setup Time   Final    AEROBIC BOTTLE ONLY GRAM POSITIVE COCCI IN CHAINS CRITICAL RESULT CALLED TO, READ BACK BY AND VERIFIED WITH: Lavell Luster [facility] 06/24/2019 0032 JDW Performed at [facility], [place] [address] [place], [place] [address]    Culture ENTEROCOCCUS  FAECALIS (A)  Final   Report Status 06/26/2019 FINAL  Final   Organism ID, Bacteria ENTEROCOCCUS FAECALIS  Final      Susceptibility   Enterococcus faecalis - MIC*    AMPICILLIN <=2 SENSITIVE Sensitive     VANCOMYCIN 1 SENSITIVE Sensitive     GENTAMICIN SYNERGY SENSITIVE Sensitive     * ENTEROCOCCUS FAECALIS  Blood Culture ID Panel (Reflexed)     Status: Abnormal   Collection Time: 07/09/2019  4:56 PM  Result Value Ref Range Status   Enterococcus species DETECTED (A) NOT DETECTED Final    Comment: CRITICAL RESULT CALLED TO, READ BACK BY AND VERIFIED WITH: J. GRIMSLEY PHARMD 06/25/19 0032 JDW    Vancomycin resistance NOT DETECTED NOT DETECTED Final   Listeria monocytogenes NOT DETECTED NOT DETECTED Final   Staphylococcus species NOT DETECTED NOT DETECTED Final   Staphylococcus aureus (BCID) NOT DETECTED NOT DETECTED Final   Streptococcus species NOT DETECTED NOT DETECTED Final   Streptococcus agalactiae NOT DETECTED NOT DETECTED Final   Streptococcus pneumoniae NOT DETECTED NOT DETECTED Final   Streptococcus pyogenes NOT DETECTED NOT DETECTED Final   Acinetobacter baumannii NOT DETECTED NOT DETECTED Final   Enterobacteriaceae species NOT DETECTED NOT DETECTED Final   Enterobacter cloacae complex NOT DETECTED NOT DETECTED Final   Escherichia coli NOT DETECTED NOT DETECTED Final   Klebsiella oxytoca NOT DETECTED NOT DETECTED Final   Klebsiella pneumoniae NOT DETECTED  NOT DETECTED Final   Proteus species NOT DETECTED NOT DETECTED Final   Serratia marcescens NOT DETECTED NOT DETECTED Final   Haemophilus influenzae NOT DETECTED NOT DETECTED Final   Neisseria meningitidis NOT DETECTED NOT DETECTED Final   Pseudomonas aeruginosa NOT DETECTED NOT DETECTED Final   Candida albicans NOT DETECTED NOT DETECTED Final   Candida glabrata NOT DETECTED NOT DETECTED Final   Candida krusei NOT DETECTED NOT DETECTED Final   Candida parapsilosis NOT DETECTED NOT DETECTED Final   Candida tropicalis NOT  DETECTED NOT DETECTED Final    Comment: Performed at [facility], [place] [address], [place], [place] [address]  Culture, blood (routine x 2)     Status: Abnormal   Collection Time: 06/28/2019  5:16 PM   Specimen: BLOOD  Result Value Ref Range Status   Specimen Description   Final    BLOOD RIGHT ANTECUBITAL Performed at [facility] [address], [place], [place] [address]    Special Requests   Final    BOTTLES DRAWN AEROBIC AND ANAEROBIC Blood Culture adequate volume Performed at [place] [address] [place], [place] [address]    Culture  Setup Time   Final    GRAM POSITIVE COCCI IN CHAINS IN BOTH AEROBIC AND ANAEROBIC BOTTLES CRITICAL RESULT CALLED TO, READ BACK BY AND VERIFIED WITH: PHARMD M RENZ [id number] MLM    Culture (A)  Final    ENTEROCOCCUS FAECALIS SUSCEPTIBILITIES PERFORMED ON PREVIOUS CULTURE WITHIN THE LAST 5 DAYS. Performed at [facility], [place] [address], [place], [place] [address]    Report Status 06/26/2019 FINAL  Final  SARS CORONAVIRUS 2 (TAT 6-24 HRS) Nasopharyngeal Nasopharyngeal Swab     Status: None   Collection Time: 07/03/2019  5:20 PM   Specimen: Nasopharyngeal Swab  Result Value Ref Range Status   SARS Coronavirus 2 NEGATIVE NEGATIVE Final    Comment: (NOTE) SARS-CoV-2 target nucleic acids are NOT DETECTED. The SARS-CoV-2 RNA is generally detectable in upper and lower respiratory specimens during the acute phase of infection. Negative results do not preclude SARS-CoV-2 infection, do not rule out co-infections with other pathogens, and should not be used as the sole basis for treatment or other patient management decisions. Negative results must be combined with clinical observations, patient history, and epidemiological information. The expected result is Negative. Fact Sheet for Patients: SugarRoll.be Fact Sheet for Healthcare  Providers: https://www.woods-mathews.com/ This test is not yet approved or cleared by the [place] FDA and  has been authorized for detection and/or diagnosis of SARS-CoV-2 by FDA under an Emergency Use Authorization (EUA). This EUA will remain  in effect (meaning this test can be used) for the duration of the COVID-19 declaration under Section 56 4(b)(1) of the Act, 21 U.S.C. section 360bbb-3(b)(1), unless the authorization is terminated or revoked sooner. Performed at [facility], [place] [address] [place], [place] [address]   MRSA PCR Screening     Status: None   Collection Time: 06/24/19  1:08 AM   Specimen: Nasal Mucosa; Nasopharyngeal  Result Value Ref Range Status   MRSA by PCR NEGATIVE NEGATIVE Final    Comment:        The GeneXpert MRSA Assay (FDA approved for NASAL specimens only), is one component of a comprehensive MRSA colonization surveillance program. It is not intended to diagnose MRSA infection nor to guide or monitor treatment for MRSA infections. Performed at [facility], [place] [address] [place],  [place] [address]   Culture, blood (routine x 2)     Status: None (Preliminary result)   Collection Time: 06/26/19  2:10 AM   Specimen: BLOOD  Result Value Ref Range Status   Specimen Description   Final    BLOOD RIGHT ARM Performed at [facility] [address] [place], [place] [address]    Special Requests   Final    BOTTLES DRAWN AEROBIC ONLY Blood Culture adequate volume Performed at [facility] [address], [place], [place] [address]    Culture   Final    NO GROWTH 3 DAYS Performed at [facility], [place] [address], [place], [place] [address]    Report Status PENDING  Incomplete  Culture, blood (routine x 2)     Status: None (Preliminary result)   Collection Time: 06/26/19  2:10 AM   Specimen: BLOOD  Result Value Ref Range Status   Specimen Description   Final     BLOOD RIGHT HAND Performed at [facility] [address] [place], [place] [address]    Special Requests   Final    BOTTLES DRAWN AEROBIC ONLY Blood Culture adequate volume Performed at [facility] [address] [place], [place] [address]    Culture   Final    NO GROWTH 3 DAYS Performed at [facility], [place] [address] [place],  [address]    Report Status PENDING  Incomplete      Studies: CT ABDOMEN PELVIS WO CONTRAST  Result Date: 06/29/2019 CLINICAL DATA:  Stage IV prostate carcinoma. Chronic renal disease. Generalized weakness. Painful urination. Foley catheter placed upon admission. Anemia. EXAM: CT CHEST, ABDOMEN AND PELVIS WITHOUT CONTRAST TECHNIQUE: Multidetector CT imaging of the chest, abdomen and pelvis was performed following the standard protocol without IV contrast. COMPARISON:  CT 06/27/2019, bone scan 12/23/2018 CT FINDINGS: CT CHEST FINDINGS Cardiovascular: No significant vascular findings. Normal heart size. No pericardial effusion. Mediastinum/Nodes: No axillary supraclavicular adenopathy. No mediastinal hilar adenopathy. No pericardial effusion Lungs/Pleura: Bilateral pleural effusions slightly increased from prior exam. There is a bibasilar atelectasis also mildly increased in LEFT lower lobe. No suspicious pulmonary nodules. 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 Several lesions are expansile within the ribs. For example the LEFT 5th rib (image 60/6) CT ABDOMEN AND PELVIS FINDINGS Hepatobiliary: No focal hepatic lesion on noncontrast exam. Gallstone within neck of the gallbladder. No gallbladder distension Pancreas: Pancreas is normal. No ductal dilatation. No pancreatic inflammation. Spleen: Normal spleen Adrenals/urinary tract: Adrenal glands are normal. There is bilateral severe hydronephrosis and hydroureter. Hydroureter extends to the urinary bladder. Findings are  similar to comparison CT 06/24/2019. There is interval placement of a Foley catheter. The previous described nodule thickening the bladder is not well appreciated. The bladder is thick-walled and collapsed. Stomach/Bowel: Stomach, small-bowel and cecum are normal. The appendix is not identified but there is no pericecal inflammation to suggest appendicitis. The colon and rectosigmoid colon are normal. Rectal thermometer noted Vascular/Lymphatic: Abdominal aorta is normal caliber with atherosclerotic calcification. There is no retroperitoneal or periportal lymphadenopathy. No pelvic lymphadenopathy. Reproductive: Prostate normal Other: No free fluid. Musculoskeletal: Widespread sclerotic skeletal metastasis unchanged. Pathologic fracture identified. IMPRESSION: Chest Impression: 1. No evidence of thoracic metastasis. 2. Interval increase in moderate effusions and basilar atelectasis. 3. Widespread skeletal metastasis Abdomen / Pelvis Impression: 1. Persistent bilateral hydronephrosis and hydroureter to the level of the bladder. Interval placement Foley catheter within thickened high-density  bladder. 2. No inflammation or infection identified in the abdomen pelvis. 3. No source of hemorrhage other than high-density thickening within the bladder. Electronically Signed   By: Suzy Bouchard M.D.   On: 06/29/2019 13:05   CT CHEST WO CONTRAST  Result Date: 06/29/2019 CLINICAL DATA:  Stage IV prostate carcinoma. Chronic renal disease. Generalized weakness. Painful urination. Foley catheter placed upon admission. Anemia. EXAM: CT CHEST, ABDOMEN AND PELVIS WITHOUT CONTRAST TECHNIQUE: Multidetector CT imaging of the chest, abdomen and pelvis was performed following the standard protocol without IV contrast. COMPARISON:  CT 06/18/2019, bone scan 12/23/2018 CT FINDINGS: CT CHEST FINDINGS Cardiovascular: No significant vascular findings. Normal heart size. No pericardial effusion. Mediastinum/Nodes: No axillary  supraclavicular adenopathy. No mediastinal hilar adenopathy. No pericardial effusion Lungs/Pleura: Bilateral pleural effusions slightly increased from prior exam. There is a bibasilar atelectasis also mildly increased in LEFT lower lobe. No suspicious pulmonary nodules. 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 Several lesions are expansile within the ribs. For example the LEFT 5th rib (image 60/6) CT ABDOMEN AND PELVIS FINDINGS Hepatobiliary: No focal hepatic lesion on noncontrast exam. Gallstone within neck of the gallbladder. No gallbladder distension Pancreas: Pancreas is normal. No ductal dilatation. No pancreatic inflammation. Spleen: Normal spleen Adrenals/urinary tract: Adrenal glands are normal. There is bilateral severe hydronephrosis and hydroureter. Hydroureter extends to the urinary bladder. Findings are similar to comparison CT 07/06/2019. There is interval placement of a Foley catheter. The previous described nodule thickening the bladder is not well appreciated. The bladder is thick-walled and collapsed. Stomach/Bowel: Stomach, small-bowel and cecum are normal. The appendix is not identified but there is no pericecal inflammation to suggest appendicitis. The colon and rectosigmoid colon are normal. Rectal thermometer noted Vascular/Lymphatic: Abdominal aorta is normal caliber with atherosclerotic calcification. There is no retroperitoneal or periportal lymphadenopathy. No pelvic lymphadenopathy. Reproductive: Prostate normal Other: No free fluid. Musculoskeletal: Widespread sclerotic skeletal metastasis unchanged. Pathologic fracture identified. IMPRESSION: Chest Impression: 1. No evidence of thoracic metastasis. 2. Interval increase in moderate effusions and basilar atelectasis. 3. Widespread skeletal metastasis Abdomen / Pelvis Impression: 1. Persistent bilateral hydronephrosis and hydroureter to the level of the bladder. Interval placement Foley  catheter within thickened high-density bladder. 2. No inflammation or infection identified in the abdomen pelvis. 3. No source of hemorrhage other than high-density thickening within the bladder. Electronically Signed   By: Suzy Bouchard M.D.   On: 06/29/2019 13:05    Scheduled Meds: . Chlorhexidine Gluconate Cloth  6 each Topical Daily  . feeding supplement (ENSURE ENLIVE)  237 mL Oral TID BM  . feeding supplement (PRO-STAT SUGAR FREE 64)  30 mL Oral BID  . gabapentin  100 mg Oral BID  . insulin aspart  0-9 Units Subcutaneous Q6H  . mouth rinse  15 mL Mouth Rinse BID  . metoprolol tartrate  12.5 mg Oral BID  . multivitamin with minerals  1 tablet Oral Daily  . pantoprazole  40 mg Oral Q0600  . protein supplement  1 Scoop Oral TID WC    Continuous Infusions: . ampicillin (OMNIPEN) IV Stopped (06/29/19 1354)  . lactated ringers 75 mL/hr at 06/29/19 1105     LOS: 6 days     Kayleen Memos, MD [facility] Pager [phone]  If 7PM-7AM, please contact night-coverage [email or web address] Password [facility] 06/29/2019, 3:08 PM

## 2019-06-29 NOTE — Progress Notes (Signed)
Initial Nutrition Assessment  DOCUMENTATION CODES:   Obesity unspecified  INTERVENTION:  - will increase Ensure Enlive from BID to TID, each supplement provides 350 kcal and 20 grams of protein. - will order 30 mL Prostat BID, each supplement provides 100 kcal and 15 grams of protein. - will order Magic Cup BID with meals, each supplement provides 290 kcal and 9 grams of protein. - will order 1 scoop Beneprotein TID with meals, each scoop provides 25 kcal and 6 grams protein.  - will order daily multivitamin with minerals.   NUTRITION DIAGNOSIS:   Increased nutrient needs related to acute illness, chronic illness, cancer and cancer related treatments as evidenced by estimated needs.  GOAL:   Patient will meet greater than or equal to 90% of their needs  MONITOR:   PO intake, Supplement acceptance, Labs, Weight trends  REASON FOR ASSESSMENT:   Consult Assessment of nutrition requirement/status  ASSESSMENT:   59 year old gentleman with stage IV prostate cancer, type 2 DM, hyperlipidemia, chronic anemia, and stage 3 CKD. He presented to the ED with generalized weakness, hypoxia, tachycardia, and painful urination. He was working with therapy at home and was shaky, tremulous, and went to Oncologist's office who then directed him to the ED. CT showed invasive prostate cancer with bladder mets and severe bilateral hydronephrosis; Foley inserted.  Per flow sheet documentation, patient consumed 25% of dinner on 3/14; 25% of lunch and dinner on 3/15. Patient is very slow to respond and seems to get fixated on things. Brief visit d/t patient needing to be transported to CT. He drank 2 cups of juice and 3/4 of a bottle of Ensure for breakfast this AM. He denies any abdominal pain/pressure. He states L leg tenderness and states that MD is aware of this. Unable to obtain any further information at this time.   Per chart review, weight on 3/11 was 217 lb and weight today is 220 lb. Weight on  05/31/19 was 234 lb. This indicates 14 lb weight loss (6% body weight) in the past 1 month; significant for time frame. Patient is at very high risk for malnutrition.   Palliative Care following and last saw patient on 3/15 at which time patient requested that his mom and son be able to be a part of decisions.   Per notes: - sepsis on admission with ongoing intermittent high-grade fevers - s/p TEE with no evidence of endocarditis  - suspected CAP - severe protein calorie malnutrition d/t hypoalbuminemia and poor oral intake - LLE pain, DVT ruled out - AKI on CKD - anemia of chronic disease with positive FOBT and hematuria  - metastatic prostate cancer on zytiga    Labs reviewed; CBG: 141 mg/dl, Na: 148 mmol/l, Cl: 112 mmol/l, BUN: 44 mg/dl, creatinine: 3.52 mg/dl, GFR: 18 ml/min. Medications reviewed; sliding scale novolog, 40 mg oral protonix/day.  IVF; LR @ 75 ml/hr.     NUTRITION - FOCUSED PHYSICAL EXAM:  completed; no muscle or fat wasting, no edema at this time.   Diet Order:   Diet Order            Diet regular Room service appropriate? Yes; Fluid consistency: Thin  Diet effective now              EDUCATION NEEDS:   Not appropriate for education at this time  Skin:  Skin Assessment: Reviewed RN Assessment  Last BM:  3/13  Height:   Ht Readings from Last 1 Encounters:  06/24/19 5\' 9"  (1.753 m)  Weight:   Wt Readings from Last 1 Encounters:  06/29/19 99.9 kg    Ideal Body Weight:  72.7 kg  BMI:  Body mass index is 32.52 kg/m.  Estimated Nutritional Needs:   Kcal:  2500-2700 kcal  Protein:  125-140 grams  Fluid:  >/= 2.5     Jarome Matin, MS, RD, LDN, CNSC Inpatient Clinical Dietitian RD pager # available in [facility]  After hours/weekend pager # available in [facility]

## 2019-06-29 NOTE — Progress Notes (Signed)
Patient placed back on cooling blanket

## 2019-06-29 NOTE — Progress Notes (Signed)
Admit: 06/26/2019 LOS: 6  27M metastatic prostate cancer, DM2, admitted with symptomatic anemia likely GI source; AoCKD3 with progressive bilateral hydronephrosis with enlarging prostate mass  Subjective:  . 4.4L UOP yesterday;   . SCr up further to 3.5, K 3.5, Na 148 . No c/o . On ampicillin for enterococcal bacteremia/UTI . Urine clearing, not pink today . Still intermittent fevers, 1L [place], persistent tachycardia . AM UNa 18  03/15 0701 - 03/16 0700 In: 1020 [P.O.:720; IV Piggyback:300] Out: 4350 [Urine:4350]  Filed Weights   06/28/19 0500 06/28/19 0515 06/29/19 0500  Weight: 99.8 kg 99.8 kg 99.9 kg    Scheduled Meds: . Chlorhexidine Gluconate Cloth  6 each Topical Daily  . feeding supplement (ENSURE ENLIVE)  237 mL Oral BID BM  . gabapentin  100 mg Oral BID  . insulin aspart  0-9 Units Subcutaneous Q6H  . mouth rinse  15 mL Mouth Rinse BID  . metoprolol tartrate  12.5 mg Oral BID  . pantoprazole  40 mg Oral Q0600   Continuous Infusions: . ampicillin (OMNIPEN) IV Stopped (06/29/19 0551)  . lactated ringers     PRN Meds:.acetaminophen **OR** acetaminophen, fentaNYL (SUBLIMAZE) injection, metoprolol tartrate, ondansetron **OR** ondansetron (ZOFRAN) IV, oxyCODONE, sodium chloride  Current Labs: reviewed    Physical Exam:  Blood pressure 125/76, pulse (!) 110, temperature 98.7 F (37.1 C), temperature source Rectal, resp. rate 18, height 5\' 9"  (1.753 m), weight 99.9 kg, SpO2 99 %. Chronically ill-appearing, NAD, conversant Tachycardic, regular, no rub Clear bilaterally Trace edema bilaterally Soft nontender, no suprapubic fullness  A 1. AoCKD3: obstructive from progressive metastatic prostate cancer with hydronephrosis; robust urine output after placement of Foley catheter suspect some solute diuresis and impaired tubular function; will need long-term relief of obstruction, per urology, but suspect that bilateral percutaneous nephrostomy tubes would be best which patient  has declined to date; sees Dr. Justin Mend at [facility] 2. Enterococcal UTI and bacteremia with sepsis, per TRH and ID; ampicillin 3. Metastatic prostate cancer, progressive 4. Anemia, possible GI source, GI following transfuse, hemoglobin 8.8 this morning 5. DM2 6. Mild hypernatremia  P . Given ongoing high UOP and increasing SNa, will add some LR @ 53mL/hr  . Not a candidate for long term HD . Agree with continued goals of care and if full scope consideration of PCNs . Cont supportive care . Daily weights, Daily Renal Panel, Strict I/Os, Avoid nephrotoxins (NSAIDs, judicious IV Contrast)    Pearson Grippe MD 06/29/2019, 11:03 AM  Recent Labs  Lab 07/10/2019 0216 06/26/19 0210 06/27/19 1716 06/28/19 0316 06/29/19 0212  NA 154*   < > 146* 145 148*  K 4.2   < > 3.7 3.6 3.5  CL 119*   < > 112* 110 112*  CO2 22   < > 23 22 24   GLUCOSE 113*   < > 136* 124* 129*  BUN 57*   < > 37* 39* 44*  CREATININE 3.82*   < > 3.14* 3.36* 3.52*  CALCIUM 8.6*   < > 9.2 9.1 9.1  PHOS 5.7*  --   --   --   --    < > = values in this interval not displayed.   Recent Labs  Lab 06/27/19 0317 06/27/19 0317 06/27/19 1930 06/28/19 0316 06/29/19 0212  WBC 9.0  --   --  9.3 10.0  NEUTROABS 6.8  --   --  6.6 7.4  HGB 7.6*   < > 7.1* 8.8* 7.7*  HCT 27.7*   < >  25.1* 31.1* 27.8*  MCV 90.8  --   --  91.2 93.3  PLT 225  --   --  229 206   < > = values in this interval not displayed.

## 2019-06-30 ENCOUNTER — Inpatient Hospital Stay: Payer: Medicaid Other

## 2019-06-30 ENCOUNTER — Inpatient Hospital Stay: Payer: Medicaid Other | Admitting: Oncology

## 2019-06-30 DIAGNOSIS — G9341 Metabolic encephalopathy: Secondary | ICD-10-CM

## 2019-06-30 DIAGNOSIS — C787 Secondary malignant neoplasm of liver and intrahepatic bile duct: Secondary | ICD-10-CM

## 2019-06-30 DIAGNOSIS — J90 Pleural effusion, not elsewhere classified: Secondary | ICD-10-CM

## 2019-06-30 LAB — CBC WITH DIFFERENTIAL/PLATELET
Abs Immature Granulocytes: 0.27 10*3/uL — ABNORMAL HIGH (ref 0.00–0.07)
Basophils Absolute: 0 10*3/uL (ref 0.0–0.1)
Basophils Relative: 0 %
Eosinophils Absolute: 0.1 10*3/uL (ref 0.0–0.5)
Eosinophils Relative: 1 %
HCT: 24.6 % — ABNORMAL LOW (ref 39.0–52.0)
Hemoglobin: 6.9 g/dL — CL (ref 13.0–17.0)
Immature Granulocytes: 3 %
Lymphocytes Relative: 14 %
Lymphs Abs: 1.2 10*3/uL (ref 0.7–4.0)
MCH: 26 pg (ref 26.0–34.0)
MCHC: 28 g/dL — ABNORMAL LOW (ref 30.0–36.0)
MCV: 92.8 fL (ref 80.0–100.0)
Monocytes Absolute: 0.5 10*3/uL (ref 0.1–1.0)
Monocytes Relative: 5 %
Neutro Abs: 6.4 10*3/uL (ref 1.7–7.7)
Neutrophils Relative %: 77 %
Platelets: 159 10*3/uL (ref 150–400)
RBC: 2.65 MIL/uL — ABNORMAL LOW (ref 4.22–5.81)
RDW: 18.2 % — ABNORMAL HIGH (ref 11.5–15.5)
WBC: 8.5 10*3/uL (ref 4.0–10.5)
nRBC: 0 % (ref 0.0–0.2)

## 2019-06-30 LAB — GLUCOSE, CAPILLARY
Glucose-Capillary: 199 mg/dL — ABNORMAL HIGH (ref 70–99)
Glucose-Capillary: 181 mg/dL — ABNORMAL HIGH (ref 70–99)
Glucose-Capillary: 202 mg/dL — ABNORMAL HIGH (ref 70–99)
Glucose-Capillary: 180 mg/dL — ABNORMAL HIGH (ref 70–99)

## 2019-06-30 LAB — HEMOGLOBIN AND HEMATOCRIT, BLOOD
HCT: 26.2 % — ABNORMAL LOW (ref 39.0–52.0)
Hemoglobin: 7.4 g/dL — ABNORMAL LOW (ref 13.0–17.0)

## 2019-06-30 LAB — BASIC METABOLIC PANEL
Anion gap: 9 (ref 5–15)
BUN: 52 mg/dL — ABNORMAL HIGH (ref 6–20)
CO2: 23 mmol/L (ref 22–32)
Calcium: 9.4 mg/dL (ref 8.9–10.3)
Chloride: 112 mmol/L — ABNORMAL HIGH (ref 98–111)
Creatinine, Ser: 3.33 mg/dL — ABNORMAL HIGH (ref 0.61–1.24)
GFR calc Af Amer: 23 mL/min — ABNORMAL LOW (ref >60)
GFR calc non Af Amer: 20 mL/min — ABNORMAL LOW (ref >60)
Glucose, Bld: 214 mg/dL — ABNORMAL HIGH (ref 70–99)
Potassium: 3.4 mmol/L — ABNORMAL LOW (ref 3.5–5.1)
Sodium: 144 mmol/L (ref 135–145)

## 2019-06-30 LAB — PREPARE RBC (CROSSMATCH)

## 2019-06-30 MED ORDER — IBUPROFEN 200 MG PO TABS
400.0000 mg | ORAL_TABLET | Freq: Once | ORAL | Status: AC
  Administered 2019-06-30: 400 mg via ORAL
  Filled 2019-06-30: qty 2

## 2019-06-30 MED ORDER — IBUPROFEN 200 MG PO TABS: 400.0000 mg | ORAL_TABLET | Freq: Once | ORAL | Status: DC

## 2019-06-30 MED ORDER — ACETAMINOPHEN 500 MG PO TABS
1000.0000 mg | ORAL_TABLET | Freq: Four times a day (QID) | ORAL | Status: DC | PRN
  Administered 2019-06-30 – 2019-07-06 (×10): 1000 mg via ORAL
  Filled 2019-06-30 (×10): qty 2

## 2019-06-30 MED ORDER — SODIUM CHLORIDE 0.9 % IV SOLN
2.0000 g | Freq: Every day | INTRAVENOUS | Status: DC
  Administered 2019-06-30 – 2019-07-04 (×5): 2 g via INTRAVENOUS
  Filled 2019-06-30: qty 2
  Filled 2019-06-30: qty 20
  Filled 2019-06-30 (×3): qty 2

## 2019-06-30 MED ORDER — SODIUM CHLORIDE 0.9% IV SOLUTION: Freq: Once | INTRAVENOUS | Status: AC

## 2019-06-30 MED ORDER — POTASSIUM CHLORIDE CRYS ER 10 MEQ PO TBCR
10.0000 meq | EXTENDED_RELEASE_TABLET | Freq: Once | ORAL | Status: AC
  Administered 2019-06-30: 10:00:00 10 meq via ORAL
  Filled 2019-06-30: qty 1

## 2019-06-30 MED ORDER — SODIUM CHLORIDE 0.9 % IV SOLN
500.0000 mg | Freq: Every day | INTRAVENOUS | Status: DC
  Administered 2019-06-30 – 2019-07-03 (×4): 500 mg via INTRAVENOUS
  Filled 2019-06-30 (×5): qty 500

## 2019-06-30 NOTE — Progress Notes (Signed)
While nurse went to lunch break patient pulled out rectal temperature probe that was placed at 0035.  Will continue to monitor oral temperatures for now.

## 2019-06-30 NOTE — Progress Notes (Signed)
Admit: 06/27/2019 LOS: 7  89M metastatic prostate cancer, DM2, admitted with symptomatic anemia likely GI source; AoCKD3 with progressive bilateral hydronephrosis with enlarging prostate mass  Subjective:  [name] SCr slightly improved . UOP 2.3L . Mother at bedside, updated . Discussed rationale for PCNs, which I strongly recommended; they are still processing this decision . SNa better 144  03/16 0701 - 03/17 0700 In: 6216.7 [P.O.:3434; I.V.:1236.3; Blood:350; IV Piggyback:296.3] Out: 2300 [Urine:2300]  Filed Weights   06/28/19 0515 06/29/19 0500 06/30/19 0500  Weight: 99.8 kg 99.9 kg 102.9 kg    Scheduled Meds: . Chlorhexidine Gluconate Cloth  6 each Topical Daily  . feeding supplement (ENSURE ENLIVE)  237 mL Oral TID BM  . feeding supplement (PRO-STAT SUGAR FREE 64)  30 mL Oral BID  . gabapentin  100 mg Oral BID  . insulin aspart  0-9 Units Subcutaneous Q6H  . mouth rinse  15 mL Mouth Rinse BID  . metoprolol tartrate  12.5 mg Oral BID  . multivitamin with minerals  1 tablet Oral Daily  . pantoprazole  40 mg Oral Q0600  . protein supplement  1 Scoop Oral TID WC   Continuous Infusions: . ampicillin (OMNIPEN) IV Stopped (06/30/19 9741)  . azithromycin Stopped (06/30/19 6384)  . cefTRIAXone (ROCEPHIN)  IV Stopped (06/30/19 0803)  . lactated ringers 75 mL/hr at 06/30/19 0700   PRN Meds:.[DISCONTINUED] acetaminophen **OR** acetaminophen, acetaminophen, fentaNYL (SUBLIMAZE) injection, metoprolol tartrate, ondansetron **OR** ondansetron (ZOFRAN) IV, oxyCODONE, sodium chloride  Current Labs: reviewed    Physical Exam:  Blood pressure (!) 135/105, pulse (!) 113, temperature 98 F (36.7 C), temperature source Oral, resp. rate (!) 22, height 5\' 9"  (1.753 m), weight 102.9 kg, SpO2 100 %. Chronically ill-appearing, NAD, conversant Tachycardic, regular, no rub Clear bilaterally Trace edema bilaterally Soft nontender, no suprapubic fullness  A 1. AoCKD3: obstructive from progressive  metastatic prostate cancer with hydronephrosis; robust urine output after placement of Foley catheter suspect some solute diuresis and impaired tubular function; will need long-term relief of obstruction, per urology, but suspect that bilateral percutaneous nephrostomy tubes would be best which patient has declined to date -- discussd 3/17 with family and recommended PCNs no matter if aggressive or palliative course from here; sees Dr. Justin Mend at [facility] 2. Enterococcal UTI and bacteremia with sepsis, per TRH and ID; ampicillin; to have TEE 3/18 3. Metastatic prostate cancer, progressive 4. Anemia, possible GI source, GI following, hemoglobin 7.4 this morning 5. DM2 6. Mild hypernatremia; iimproved with IVFs  P . Cont LR for another 24h given iimproved labs, stable UOP . Not a candidate for long term HD, probably not short term, pt and mother aware and accepting of this . Recommended he have PCNs . Cont supportive care . Daily weights, Daily Renal Panel, Strict I/Os, Avoid nephrotoxins (NSAIDs, judicious IV Contrast)    Pearson Grippe MD 06/30/2019, 12:40 PM  Recent Labs  Lab 06/23/2019 0216 06/26/19 0210 06/28/19 0316 06/29/19 0212 06/29/19 1054 06/30/19 0156  NA 154*   < > 145 148*  --  144  K 4.2   < > 3.6 3.5  --  3.4*  CL 119*   < > 110 112*  --  112*  CO2 22   < > 22 24  --  23  GLUCOSE 113*   < > 124* 129*  --  214*  BUN 57*   < > 39* 44*  --  52*  CREATININE 3.82*   < > 3.36* 3.52*  --  3.33*  CALCIUM 8.6*   < > 9.1 9.1  --  9.4  PHOS 5.7*  --   --   --  5.1*  --    < > = values in this interval not displayed.   Recent Labs  Lab 06/28/19 0316 06/28/19 0316 06/29/19 0212 06/29/19 0212 06/29/19 1054 06/30/19 0156 06/30/19 0839  WBC 9.3  --  10.0  --   --  8.5  --   NEUTROABS 6.6  --  7.4  --   --  6.4  --   HGB 8.8*   < > 7.7*   < > 7.5* 6.9* 7.4*  HCT 31.1*   < > 27.8*   < > 26.0* 24.6* 26.2*  MCV 91.2  --  93.3  --   --  92.8  --   PLT 229  --  206  --   --  159  --     < > = values in this interval not displayed.

## 2019-06-30 NOTE — Progress Notes (Signed)
Events the last few days noted.  He remains overall lethargic but responsive although not providing much insight or history.  Mother is at bedside at this time.  Multiple complications including bilateral hydronephrosis, renal failure, sepsis as well as GI bleeding is complicated and prolonged hospital course.    Laboratory data reviewed with kidney function overall stable based on his BUN and creatinine.  He still has some reasonable urine output.  CT scan of the chest abdomen and pelvis obtained on June 29, 2019 was personally reviewed: He continues to have widespread bone disease although no clear progression.  He continues to have bilateral hydronephrosis despite Foley placement.   Impression and recommendations:  59 year old man with:  1.  Advanced prostate cancer with metastatic disease to the bone as well as locally advanced disease causing bilateral hydronephrosis.  The natural course of this disease was discussed today in detail with the patient and his mother at bedside.  He was not able to cooperate too much with the discussion today.  He continues to have incurable malignancy although disease that has been palliated with androgen deprivation therapy and Zytiga.  Eventually his disease will progress further and will cause more deterioration in his quality of life.  His quality of life has been poor since the diagnosis of prostate cancer and despite successful treatment he has continued to decline.  Although it is reasonable to continue aggressive measures his disease likely will progress in the future and eventually will cause deterioration.  By the time being I have no objections with continuing the current measures and Zytiga will be restarted in the future if he recovers from this episode.  On the other hand, if his kidney functions continue to deteriorate no dialysis is planned, it is reasonable to consider comfort measures only.  2.  Bilateral hydronephrosis: Related to his  prostate cancer.  No improvement in his creatinine at this time and nephrostomy tubes are currently considered.  Patient and his family are not sure how to proceed at this time.  3.  Anemia: Multifactorial in nature related to his GI bleed and renal failure.  Hemoglobin is adequate at this time.  4.  Prognosis: This was discussed today with the patient and his mother in detail.  His disease remains incurable and any treatment is palliative.  Quality of life has been very poor since diagnosis of his cancer.  He opts to have no aggressive measures moving forward it would be a reasonable option from an oncology standpoint   30  minutes were spent on this encounter.  More than 50% of the time was face-to-face and dedicated to reviewing laboratory data, imaging studies, discussing treatment options and answering questions regarding future plan.

## 2019-06-30 NOTE — Progress Notes (Signed)
Updates provided to the patient's mother, Ms. Hassan Rowan.  All questions answered.

## 2019-06-30 NOTE — Progress Notes (Signed)
Spoke with Sharlet Salina, NP and made her aware of critical hgb of 6.9 with no obvious blood loss. Discussed that patient has been intermittently febrile but has remained afebrile since giving ibuprofen at 1900 last night. NP stated she would pre medicate patient and order a unit of blood.

## 2019-06-30 NOTE — Progress Notes (Addendum)
Subjective:  Febrile again overnight  Antibiotics:  Anti-infectives (From admission, onward)   Start     Dose/Rate Route Frequency Ordered Stop   06/30/19 0615  cefTRIAXone (ROCEPHIN) 2 g in sodium chloride 0.9 % 100 mL IVPB     2 g 200 mL/hr over 30 Minutes Intravenous Daily 06/30/19 0541     06/30/19 0615  azithromycin (ZITHROMAX) 500 mg in sodium chloride 0.9 % 250 mL IVPB     500 mg 250 mL/hr over 60 Minutes Intravenous Daily 06/30/19 0542     06/27/19 1800  cefTRIAXone (ROCEPHIN) 2 g in sodium chloride 0.9 % 100 mL IVPB  Status:  Discontinued     2 g 200 mL/hr over 30 Minutes Intravenous Every 24 hours 06/27/19 1635 06/28/19 1426   07/09/2019 1400  ampicillin (OMNIPEN) 2 g in sodium chloride 0.9 % 100 mL IVPB     2 g 300 mL/hr over 20 Minutes Intravenous Every 8 hours 06/16/2019 0612     07/03/2019 0115  ampicillin (OMNIPEN) 2 g in sodium chloride 0.9 % 100 mL IVPB  Status:  Discontinued     2 g 300 mL/hr over 20 Minutes Intravenous Every 6 hours 07/13/2019 0107 06/19/2019 0611   06/24/19 0800  cefTRIAXone (ROCEPHIN) 2 g in sodium chloride 0.9 % 100 mL IVPB  Status:  Discontinued     2 g 200 mL/hr over 30 Minutes Intravenous Every 24 hours 06/24/19 0722 07/09/2019 0104   07/10/2019 1700  cefTRIAXone (ROCEPHIN) 1 g in sodium chloride 0.9 % 100 mL IVPB     1 g 200 mL/hr over 30 Minutes Intravenous  Once 07/13/2019 1652 06/26/2019 1949      Medications: Scheduled Meds:  Chlorhexidine Gluconate Cloth  6 each Topical Daily   feeding supplement (ENSURE ENLIVE)  237 mL Oral TID BM   feeding supplement (PRO-STAT SUGAR FREE 64)  30 mL Oral BID   gabapentin  100 mg Oral BID   insulin aspart  0-9 Units Subcutaneous Q6H   mouth rinse  15 mL Mouth Rinse BID   metoprolol tartrate  12.5 mg Oral BID   multivitamin with minerals  1 tablet Oral Daily   pantoprazole  40 mg Oral Q0600   protein supplement  1 Scoop Oral TID WC   Continuous Infusions:  ampicillin (OMNIPEN) IV  Stopped (06/30/19 1401)   azithromycin Stopped (06/30/19 9417)   cefTRIAXone (ROCEPHIN)  IV Stopped (06/30/19 0803)   lactated ringers 75 mL/hr at 06/30/19 0700   PRN Meds:.[DISCONTINUED] acetaminophen **OR** acetaminophen, acetaminophen, fentaNYL (SUBLIMAZE) injection, metoprolol tartrate, ondansetron **OR** ondansetron (ZOFRAN) IV, oxyCODONE, sodium chloride    Objective: Weight change: 3 kg  Intake/Output Summary (Last 24 hours) at 06/30/2019 1455 Last data filed at 06/30/2019 1150 Gross per 24 hour  Intake 4802.65 ml  Output 2800 ml  Net 2002.65 ml   Blood pressure 137/85, pulse (!) 115, temperature 97.9 F (36.6 C), temperature source Oral, resp. rate 18, height 5\' 9"  (1.753 m), weight 102.9 kg, SpO2 97 %. Temp:  [97.6 F (36.4 C)-103.4 F (39.7 C)] 97.9 F (36.6 C) (03/17 1200) Pulse Rate:  [102-145] 115 (03/17 1300) Resp:  [12-32] 18 (03/17 1300) BP: (90-151)/(49-105) 137/85 (03/17 1300) SpO2:  [93 %-100 %] 97 % (03/17 1300) Weight:  [102.9 kg] 102.9 kg (03/17 0500)  Physical Exam: General: alert and oriented but fatigued appearing Mother is at the bedside HEENT: anicteric sclera, EOMI CVS tachycardic   Chest: , no wheezing, no respiratory  distress Abdomen: soft non-distended,  Extremities: edematous Skin: no rashes Neuro: nonfocal  CBC:    BMET Recent Labs    06/29/19 0212 06/30/19 0156  NA 148* 144  K 3.5 3.4*  CL 112* 112*  CO2 24 23  GLUCOSE 129* 214*  BUN 44* 52*  CREATININE 3.52* 3.33*  CALCIUM 9.1 9.4     Liver Panel  No results for input(s): PROT, ALBUMIN, AST, ALT, ALKPHOS, BILITOT, BILIDIR, IBILI in the last 72 hours.     Sedimentation Rate No results for input(s): ESRSEDRATE in the last 72 hours. C-Reactive Protein No results for input(s): CRP in the last 72 hours.  Micro Results: Recent Results (from the past 720 hour(s))  Urine culture     Status: Abnormal   Collection Time: 06/22/2019  3:37 PM   Specimen: Urine, Random    Result Value Ref Range Status   Specimen Description   Final    URINE, RANDOM Performed at [facility] [address], [place], [place] [address]    Special Requests   Final    NONE Performed at [facility], [place] [address] [place], [place] [address]    Culture MULTIPLE SPECIES PRESENT, SUGGEST RECOLLECTION (A)  Final   Report Status 06/24/2019 FINAL  Final  Culture, blood (routine x 2)     Status: Abnormal   Collection Time: 07/07/2019  4:56 PM   Specimen: BLOOD  Result Value Ref Range Status   Specimen Description   Final    BLOOD RIGHT ANTECUBITAL Performed at [facility] [address] [place], [place] [address]    Special Requests   Final    BOTTLES DRAWN AEROBIC ONLY Blood Culture results may not be optimal due to an excessive volume of blood received in culture bottles Performed at [facility] [address] [place], [place] [address]    Culture  Setup Time   Final    AEROBIC BOTTLE ONLY GRAM POSITIVE COCCI IN CHAINS CRITICAL RESULT CALLED TO, READ BACK BY AND VERIFIED WITH: Lavell Luster [facility] 06/22/2019 0032 JDW Performed at [facility], [place] [address], [place], [place] [address]    Culture ENTEROCOCCUS FAECALIS (A)  Final   Report Status 06/26/2019 FINAL  Final   Organism ID, Bacteria ENTEROCOCCUS FAECALIS  Final      Susceptibility   Enterococcus faecalis - MIC*    AMPICILLIN <=2 SENSITIVE Sensitive     VANCOMYCIN 1 SENSITIVE Sensitive     GENTAMICIN SYNERGY SENSITIVE Sensitive     * ENTEROCOCCUS FAECALIS  Blood Culture ID Panel (Reflexed)     Status: Abnormal   Collection Time: 07/05/2019  4:56 PM  Result Value Ref Range Status   Enterococcus species DETECTED (A) NOT DETECTED Final    Comment: CRITICAL RESULT CALLED TO, READ BACK BY AND VERIFIED WITH: J. GRIMSLEY PHARMD 07/14/2019 0032 JDW    Vancomycin resistance NOT DETECTED NOT DETECTED Final   Listeria monocytogenes NOT  DETECTED NOT DETECTED Final   Staphylococcus species NOT DETECTED NOT DETECTED Final   Staphylococcus aureus (BCID) NOT DETECTED NOT DETECTED Final   Streptococcus species NOT DETECTED NOT DETECTED Final   Streptococcus agalactiae NOT DETECTED NOT DETECTED Final   Streptococcus pneumoniae NOT DETECTED NOT DETECTED Final   Streptococcus pyogenes NOT DETECTED NOT DETECTED Final   Acinetobacter baumannii NOT DETECTED NOT DETECTED Final   Enterobacteriaceae species NOT DETECTED NOT DETECTED Final   Enterobacter cloacae complex NOT DETECTED NOT DETECTED Final  Escherichia coli NOT DETECTED NOT DETECTED Final   Klebsiella oxytoca NOT DETECTED NOT DETECTED Final   Klebsiella pneumoniae NOT DETECTED NOT DETECTED Final   Proteus species NOT DETECTED NOT DETECTED Final   Serratia marcescens NOT DETECTED NOT DETECTED Final   Haemophilus influenzae NOT DETECTED NOT DETECTED Final   Neisseria meningitidis NOT DETECTED NOT DETECTED Final   Pseudomonas aeruginosa NOT DETECTED NOT DETECTED Final   Candida albicans NOT DETECTED NOT DETECTED Final   Candida glabrata NOT DETECTED NOT DETECTED Final   Candida krusei NOT DETECTED NOT DETECTED Final   Candida parapsilosis NOT DETECTED NOT DETECTED Final   Candida tropicalis NOT DETECTED NOT DETECTED Final    Comment: Performed at [facility], [place] [address] [place], [place] [address]  Culture, blood (routine x 2)     Status: Abnormal   Collection Time: 06/17/2019  5:16 PM   Specimen: BLOOD  Result Value Ref Range Status   Specimen Description   Final    BLOOD RIGHT ANTECUBITAL Performed at [facility] [address] [place], [place] [address]    Special Requests   Final    BOTTLES DRAWN AEROBIC AND ANAEROBIC Blood Culture adequate volume Performed at [facility] [address], [place], [place] [address]    Culture  Setup Time   Final    GRAM POSITIVE COCCI IN CHAINS IN BOTH AEROBIC AND ANAEROBIC  BOTTLES CRITICAL RESULT CALLED TO, READ BACK BY AND VERIFIED WITH: PHARMD M RENZ [id number] [phone]    Culture (A)  Final    ENTEROCOCCUS FAECALIS SUSCEPTIBILITIES PERFORMED ON PREVIOUS CULTURE WITHIN THE LAST 5 DAYS. Performed at [facility], [place] [address], [place], [place] [address]    Report Status 06/26/2019 FINAL  Final  SARS CORONAVIRUS 2 (TAT 6-24 HRS) Nasopharyngeal Nasopharyngeal Swab     Status: None   Collection Time: 07/07/2019  5:20 PM   Specimen: Nasopharyngeal Swab  Result Value Ref Range Status   SARS Coronavirus 2 NEGATIVE NEGATIVE Final    Comment: (NOTE) SARS-CoV-2 target nucleic acids are NOT DETECTED. The SARS-CoV-2 RNA is generally detectable in upper and lower respiratory specimens during the acute phase of infection. Negative results do not preclude SARS-CoV-2 infection, do not rule out co-infections with other pathogens, and should not be used as the sole basis for treatment or other patient management decisions. Negative results must be combined with clinical observations, patient history, and epidemiological information. The expected result is Negative. Fact Sheet for Patients: SugarRoll.be Fact Sheet for Healthcare Providers: https://www.woods-mathews.com/ This test is not yet approved or cleared by the [place] FDA and  has been authorized for detection and/or diagnosis of SARS-CoV-2 by FDA under an Emergency Use Authorization (EUA). This EUA will remain  in effect (meaning this test can be used) for the duration of the COVID-19 declaration under Section 56 4(b)(1) of the Act, 21 U.S.C. section 360bbb-3(b)(1), unless the authorization is terminated or revoked sooner. Performed at [facility], [place] [address], [place],  [address]   MRSA PCR Screening     Status: None   Collection Time: 06/24/19  1:08 AM   Specimen: Nasal Mucosa; Nasopharyngeal  Result Value Ref Range Status   MRSA by PCR  NEGATIVE NEGATIVE Final    Comment:        The GeneXpert MRSA Assay (FDA approved for NASAL specimens only), is one component of a comprehensive MRSA colonization surveillance program. It is not intended to diagnose  MRSA infection nor to guide or monitor treatment for MRSA infections. Performed at [facility], [place] [address] [place], [place] [address]   Culture, blood (routine x 2)     Status: None (Preliminary result)   Collection Time: 06/26/19  2:10 AM   Specimen: BLOOD  Result Value Ref Range Status   Specimen Description   Final    BLOOD RIGHT ARM Performed at [facility] [address] [place], [place] [address]    Special Requests   Final    BOTTLES DRAWN AEROBIC ONLY Blood Culture adequate volume Performed at [facility] [address], [place], [place] [address]    Culture   Final    NO GROWTH 4 DAYS Performed at [facility], [place] [address] [place], [place] [address]    Report Status PENDING  Incomplete  Culture, blood (routine x 2)     Status: None (Preliminary result)   Collection Time: 06/26/19  2:10 AM   Specimen: BLOOD  Result Value Ref Range Status   Specimen Description   Final    BLOOD RIGHT HAND Performed at [facility] [address] [place], [place] [address]    Special Requests   Final    BOTTLES DRAWN AEROBIC ONLY Blood Culture adequate volume Performed at [facility] [address], [place],  [address]    Culture   Final    NO GROWTH 4 DAYS Performed at [facility], [place] [address] [place],  [address]    Report Status PENDING  Incomplete    Studies/Results: CT ABDOMEN PELVIS WO CONTRAST  Result Date: 06/29/2019 CLINICAL DATA:  Stage IV prostate carcinoma. Chronic renal disease. Generalized weakness. Painful urination. Foley catheter placed upon admission. Anemia. EXAM: CT CHEST, ABDOMEN AND PELVIS WITHOUT  CONTRAST TECHNIQUE: Multidetector CT imaging of the chest, abdomen and pelvis was performed following the standard protocol without IV contrast. COMPARISON:  CT 06/21/2019, bone scan 12/23/2018 CT FINDINGS: CT CHEST FINDINGS Cardiovascular: No significant vascular findings. Normal heart size. No pericardial effusion. Mediastinum/Nodes: No axillary supraclavicular adenopathy. No mediastinal hilar adenopathy. No pericardial effusion Lungs/Pleura: Bilateral pleural effusions slightly increased from prior exam. There is a bibasilar atelectasis also mildly increased in LEFT lower lobe. No suspicious pulmonary nodules. 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 Several lesions are expansile within the ribs. For example the LEFT 5th rib (image 60/6) CT ABDOMEN AND PELVIS FINDINGS Hepatobiliary: No focal hepatic lesion on noncontrast exam. Gallstone within neck of the gallbladder. No gallbladder distension Pancreas: Pancreas is normal. No ductal dilatation. No pancreatic inflammation. Spleen: Normal spleen Adrenals/urinary tract: Adrenal glands are normal. There is bilateral severe hydronephrosis and hydroureter. Hydroureter extends to the urinary bladder. Findings are similar to comparison CT 06/26/2019. There is interval placement of a Foley catheter. The previous described nodule thickening the bladder is not well appreciated. The bladder is thick-walled and collapsed. Stomach/Bowel: Stomach, small-bowel and cecum are normal. The appendix is not identified but there is no pericecal inflammation to suggest appendicitis. The colon and rectosigmoid colon are normal. Rectal thermometer noted Vascular/Lymphatic: Abdominal aorta is normal caliber with atherosclerotic calcification. There is no retroperitoneal or periportal lymphadenopathy. No pelvic lymphadenopathy. Reproductive: Prostate normal Other: No free fluid. Musculoskeletal: Widespread sclerotic skeletal metastasis  unchanged. Pathologic fracture identified. IMPRESSION: Chest Impression: 1. No evidence of thoracic metastasis. 2. Interval increase in moderate effusions and basilar atelectasis. 3. Widespread skeletal metastasis Abdomen / Pelvis  Impression: 1. Persistent bilateral hydronephrosis and hydroureter to the level of the bladder. Interval placement Foley catheter within thickened high-density bladder. 2. No inflammation or infection identified in the abdomen pelvis. 3. No source of hemorrhage other than high-density thickening within the bladder. Electronically Signed   By: Suzy Bouchard M.D.   On: 06/29/2019 13:05   CT CHEST WO CONTRAST  Result Date: 06/29/2019 CLINICAL DATA:  Stage IV prostate carcinoma. Chronic renal disease. Generalized weakness. Painful urination. Foley catheter placed upon admission. Anemia. EXAM: CT CHEST, ABDOMEN AND PELVIS WITHOUT CONTRAST TECHNIQUE: Multidetector CT imaging of the chest, abdomen and pelvis was performed following the standard protocol without IV contrast. COMPARISON:  CT 06/17/2019, bone scan 12/23/2018 CT FINDINGS: CT CHEST FINDINGS Cardiovascular: No significant vascular findings. Normal heart size. No pericardial effusion. Mediastinum/Nodes: No axillary supraclavicular adenopathy. No mediastinal hilar adenopathy. No pericardial effusion Lungs/Pleura: Bilateral pleural effusions slightly increased from prior exam. There is a bibasilar atelectasis also mildly increased in LEFT lower lobe. No suspicious pulmonary nodules. 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 Several lesions are expansile within the ribs. For example the LEFT 5th rib (image 60/6) CT ABDOMEN AND PELVIS FINDINGS Hepatobiliary: No focal hepatic lesion on noncontrast exam. Gallstone within neck of the gallbladder. No gallbladder distension Pancreas: Pancreas is normal. No ductal dilatation. No pancreatic inflammation. Spleen: Normal spleen Adrenals/urinary  tract: Adrenal glands are normal. There is bilateral severe hydronephrosis and hydroureter. Hydroureter extends to the urinary bladder. Findings are similar to comparison CT 07/13/2019. There is interval placement of a Foley catheter. The previous described nodule thickening the bladder is not well appreciated. The bladder is thick-walled and collapsed. Stomach/Bowel: Stomach, small-bowel and cecum are normal. The appendix is not identified but there is no pericecal inflammation to suggest appendicitis. The colon and rectosigmoid colon are normal. Rectal thermometer noted Vascular/Lymphatic: Abdominal aorta is normal caliber with atherosclerotic calcification. There is no retroperitoneal or periportal lymphadenopathy. No pelvic lymphadenopathy. Reproductive: Prostate normal Other: No free fluid. Musculoskeletal: Widespread sclerotic skeletal metastasis unchanged. Pathologic fracture identified. IMPRESSION: Chest Impression: 1. No evidence of thoracic metastasis. 2. Interval increase in moderate effusions and basilar atelectasis. 3. Widespread skeletal metastasis Abdomen / Pelvis Impression: 1. Persistent bilateral hydronephrosis and hydroureter to the level of the bladder. Interval placement Foley catheter within thickened high-density bladder. 2. No inflammation or infection identified in the abdomen pelvis. 3. No source of hemorrhage other than high-density thickening within the bladder. Electronically Signed   By: Suzy Bouchard M.D.   On: 06/29/2019 13:05      Assessment/Plan:  INTERVAL HISTORY:   CT scan showed diffuse metastatic disease.  He does have bilateral pleural effusions that have increased in size   Principal Problem:   Severe sepsis (HCC) Active Problems:   Urinary retention   AKI (acute kidney injury) ([place])   Cholelithiasis   Diabetes mellitus, new onset ([place])   DM2 (diabetes mellitus, type 2) (HCC)   CKD (chronic kidney disease) stage 3, GFR 30-59 ml/min   Prostate cancer  (HCC)   GI bleed   Heme positive stool   Anemia due to chronic blood loss   Anemia   Enterococcal bacteremia   Palliative care by specialist   Goals of care, counseling/discussion    Jeffery [name] is a 59 y.o. male with progressive metastatic prostate cancer bilateral hydronephrosis and obstructive uropathy with AMP S enterococcal bacteremia, thought to be from urinary source  #1 AMP S E faecalis bacteremia: Likely from urinary source.  Transthoracic echocardiogram does not show evidence of vegetations  If  being aggressive we will obtain a transesophageal echocardiogram.   2. Fevers: Would obtain diagnostic and therapeutic thoracentesis to see if this might be the source of his fevers.  Did not appear to have any overt malignancy that has metastasized to the liver.  I am still skeptical that he has a pneumonia but will not object addition of ceftriaxone and azithromycin.  The latter will need to be increased to 2 g every 12 he has endocarditis  3.  Progressive metastatic prostate cancer: Agree with goals of care being continue to be discussed and continued conversations with palliative care   Dr. Johnnye Sima will be here tomorrow and Friday am.   LOS: 7 days   Alcide Evener 06/30/2019, 2:55 PM

## 2019-06-30 NOTE — Progress Notes (Signed)
Patient's oral temp of 102.75F and heart rate in the 140's reported to Dr.Hall. Orders to place patient back on cooling blanket and apply ice packs along with a one time dose of Ibuprofen. Will continue to monitor.

## 2019-06-30 NOTE — Progress Notes (Signed)
Cooling blanket turned off.  Will continue to monitor closely.  Pt alert and oriented x 4.  He doses off to sleep easily but also arouses easily.  Asking for drink.

## 2019-06-30 NOTE — Progress Notes (Signed)
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    PT Comments    The patient's MS is clearer today, continues to require 2 mod assist for mobility and transfers. Not ye strong enough to ambulate. . Patient's mother  Present. Patient may benefit from [facility] but HHPT if family and  Patient desire  To go home.  Follow Up Recommendations  SNF;Home health PT     Equipment Recommendations  Rolling walker with 5" wheels;3in1 (PT)    Recommendations for Other Services       Precautions / Restrictions Precautions Precautions: Fall Precaution Comments: loose BM, ask if he needs BSC    Mobility  Bed Mobility   Bed Mobility: Rolling;Supine to Sit Rolling: Mod assist   Supine to sit: +2 for safety/equipment;Mod assist;Max assist     General bed mobility comments: patient initiated the transition, assist with trunk and used bed pat to scoot. patient has extra fluid in abdomen and  upper thighs and periarea.  Transfers Overall transfer level: Needs assistance Equipment used: Rolling walker (2 wheeled) Transfers: Sit to/from [facility] Sit to Stand: Mod assist;+2 physical assistance;+2 safety/equipment Stand pivot transfers: Mod assist       General transfer comment: assist to transfer from bed to [facility], patient reaching to armrests, steady mod assit to get  barely onto [facility], Patient able to scoot  to get to middle. Stood again  from [facility], recliner brought up.  Ambulation/Gait                 Stairs             Wheelchair Mobility    Modified Rankin (Stroke Patients Only)       Balance Overall balance assessment: Needs assistance   Sitting balance-Leahy Scale: Fair     Standing balance support: Bilateral upper extremity supported Standing balance-Leahy Scale: Poor Standing balance comment: relies on UE and mod steady assist                            Cognition Arousal/Alertness: Awake/alert Behavior During Therapy: WFL for tasks assessed/performed Overall Cognitive Status: Impaired/Different from baseline Area of Impairment: Attention;Safety/judgement;Awareness;Problem solving                   Current Attention Level: Selective     Safety/Judgement: Decreased awareness of deficits;Decreased awareness of safety Awareness: Emergent Problem Solving: Slow processing;Decreased initiation;Requires verbal cues General Comments: more engaged in conversation, Mother present during session and patient approriately answered questions and conversed with her.Alerted staff that he needed to go to [facility], felt he could walk but not strong enough yes      Exercises      General Comments        Pertinent Vitals/Pain Pain Assessment: Faces Faces Pain Scale: Hurts little more Pain Location: buttocks Pain Descriptors / Indicators: Discomfort Pain Intervention(s): Monitored during session;Repositioned    Home Living  Prior Function            PT Goals (current goals can now be found in the care plan section) Progress towards PT goals: Progressing toward goals    Frequency    Min 2X/week      PT Plan Current plan remains appropriate    Co-evaluation              AM-PAC PT "6 Clicks" Mobility   Outcome Measure  Help needed turning from your back to your side while in a flat bed without using bedrails?: A Lot Help needed  moving from lying on your back to sitting on the side of a flat bed without using bedrails?: A Lot Help needed moving to and from a bed to a chair (including a wheelchair)?: A Lot Help needed standing up from a chair using your arms (e.g., wheelchair or bedside chair)?: A Lot Help needed to walk in hospital room?: Total Help needed climbing 3-5 steps with a railing? : Total 6 Click Score: 10    End of Session Equipment Utilized During Treatment: Gait belt;Oxygen Activity Tolerance: Patient tolerated treatment well;Patient limited by fatigue Patient left: in chair;with call bell/phone within reach;with chair alarm set;with nursing/sitter in room;with family/visitor present Nurse Communication: Mobility status PT Visit Diagnosis: Unsteadiness on feet (R26.81);Difficulty in walking, not elsewhere classified (R26.2)     Time: 1104-1130 PT Time Calculation (min) (ACUTE ONLY): 26 min  Charges:  $Therapeutic Activity: 8-22 mins $Self Care/Home Management: [place] Pager [phone] Office [phone]    Claretha Cooper 06/30/2019, 1:37 PM

## 2019-06-30 NOTE — Progress Notes (Signed)
PROGRESS NOTE  Jeffery [name] YJE:[id number] DOB: 06/05/1960 DOA: 06/20/2019 PCP: [name], MD  HPI/Recap of past 24 hours: Patient is a 59 year old gentleman with PMH of stage IV prostate cancer with mets to bones, type II diabetes, polyneuropathy, hyperlipidemia, chronic kidney disease stage IIIB, anemia of chronic disease, chronic guaiac positive stools who presented to the emergency room with generalized weakness, hypoxia and tachycardia, painful urination.   He was working with therapies at home, he was found shaky and tremulous, oncology office was called who directed him to the ER.  Febrile tachycardic in the ED.  Hemoglobin 6.4.  Transfused 2 unit PRBCs.  Creatinine up to 3.54.   CT renal 3/10 showed invasive prostate cancer into the bladder with severe bilateral hydronephrosis.  A Foley catheter was inserted.  TRH was asked to admit.  Intermittent high grade fevers, suspected hematuria with Hg drop down to 7.1.  Transfused 1U PRBC on 3/14, Hg up to 8.8 post RBC transfusion.  Received IV lasix due to volume overload with acute hypoxia.  Responded well with improvement of hypoxia. IV lasix dced on 3/15 due to rise in creatinine and soft BPs.  ID, nephrology, and palliative medicine team assisting with the management.  06/30/19: Seen and examined this AM.  States he is sleepy.  He answers questions appropriately. Denies pain or dyspnea.  Tmax 103.4 last night.  Rising procalcitonn up to 16 from 8.  Productive cough.  Started on IV abx empirically until PNA is ruled out.  TEE planned tomorrow to r/o endocarditis.  On IV fluid per nephrology.  Closely monitor volume status and hypoxia.   Assessment/Plan: Principal Problem:   Severe sepsis (HCC) Active Problems:   Urinary retention   AKI (acute kidney injury) ([place])   Cholelithiasis   Diabetes mellitus, new onset ([place])   DM2 (diabetes mellitus, type 2) (HCC)   CKD (chronic kidney disease) stage 3, GFR 30-59 ml/min  Prostate cancer (HCC)   GI bleed   Heme positive stool   Anemia due to chronic blood loss   Anemia   Enterococcal bacteremia   Palliative care by specialist   Goals of care, counseling/discussion  Sepsis, present on admission suspect from urinary source, Enterococcus bacteremia and possibly CAP, POA. Intermittent high-grade fevers with T-max 103.4 overnight  Tachycardic with rates between 115 and 130s Tmax 103.4 last night.  Rising procalcitonn up to 16 from 8.  Productive cough.  Started on IV abx empirically IV Azithromycin/Rocephin to cover CAP, until PNA is ruled out.   Continue to Maintain MAP greater than 65 Blood cultures x2 grew Enterococcus faecalis, sensitive to ampicillin Repeated blood cultures negative to date, continue to follow cultures until finalized. Ucx multiple species present, suggesting recollection CT chest, abdomen and pelvis completed on 06/29/2019 showing persistent bilateral hydronephrosis and hydroureter to the level of the bladder.  No source of hemorrhage other than high density thickening within the bladder.  No inflammation no infection identified in the abdomen and pelvis. B/L pleural effusion and atelectasis, personally reviewed.  Enterococcus bacteremia 2/2 bottles Management as per above Procalcitonin is 8.4 Repeated blood cultures on 06/26/2019 negative to date.  Continue to follow cultures TTE shows no evidence of endocarditis Colonoscopy planned once hemodynamically stable Infectious disease has been consulted and following. Cardiology consult for possible TEE. Possibly on Thursday, 07/01/19.  NPO after midnight.  Acute hypoxic respiratory failure secondary to suspected pulmonary edema/bilateral pleural effusion Not on oxygen supplementation at baseline Continue to maintain O2 saturation greater than  92%  Suspected CAP Independently reviewed CXR showing increase in pulmonary vascularity with RLL opacity.  CAP could not be excluded  Tmax 103.4  last night.  Rising procalcitonn up to 16 from 8.  Productive cough.  Started on IV abx empirically until PNA is ruled out.    Severe protein calorie malnutrition Albumin 1.7 BMI 32 with loss of muscle mass Continue to encourage increase in protein calorie intake Dietitian consulted, appreciate recommendations.  Refractory sinus tachycardia likely multifactorial 2/2 to sepsis versus fever versus other ST on 12 lead EKG Intermittent fevers Continue po lopressor 12.5 mg BID IV lopressor 2.5 mg q4H prn with HR>130 when MAP>70  Resolved prolonged QTC 12 lead EKG on 3/12 QTC >540 Repeated 12 lead EKG done on 3/14 QTc 353  Left lower extremity pain, DVT ruled out. Chronic left lower extremity weakness with worsening pain Bilateral Doppler ultrasound negative for DVT.  Acute kidney injury on chronic kidney disease stage IIIB likely postrenal versus prerenal 2/2 to obstructive uropathy from prostate cancer versus intravascular volume depletion:  Baseline creatinine appears to be 2.0 with GFR of 36 Presented with creatinine up to 3.8 Creatinine  3.52>> 3.3 Continue to avoid nephrotoxins and hypotension Nephrology following, appreciate assistance. Continue Foley catheter #6 Fluid started by nephrology LR at 75cc/hr Good UO  Resolved Hypokalemia K+ 3.2, repeated 3.5>> 3.6> 3.5.  Hyperphosphatemia in the setting of acute renal failure Phosphorous 5.1 Defer to nephrology  Anemia of chronic disease, positive FOBT and mild hematuria:  Received 2 units of PRBC.  Hg 7.2>>7.6> 7.1 Transfused 1 unit PRBC on 06/27/2019 Transfused 1 U PRBC 06/30/19 Continue PPI Continue to monitor H&H.  Resolved Hypernatremia:  Na+ 144 on LR Free water oral intake instead of IV fluids for now  Type 2 diabetes with hyperglycemia:  A1C 6.6 on 06/24/19.  Hold off Metformin, home med, may have to dc with poor renal function.   Continue sliding scale.  Metastatic prostate cancer on Zytiga, bladder  obstruction, debility and chronic anemia: Followed by his oncologist.  Will have outpatient follow-up after stabilization. Zytiga on hold  Goals of care Palliative care following  DVT prophylaxis: DVT prophylaxis with SCDs Code Status: Full code  Family Communication:  Updated the patient's mother via phone.  Disposition Plan: patient is from home. Anticipated DC to home, Barriers to discharge active treatment, IV antibiotics, inpatient procedures.   Consultants:   Urology  GI  Oncology  Palliative medicine  Nephrology  Dietitian  Procedures:   Foley cath insertion     Objective: Vitals:   06/30/19 0700 06/30/19 0800 06/30/19 0900 06/30/19 1000  BP: 135/81 125/63 (!) 110/56 (!) 121/58  Pulse: (!) 115 (!) 119 (!) 118 (!) 110  Resp: (!) 21 (!) 27 19 12   Temp:      TempSrc:      SpO2: 96% 97% 93% 97%  Weight:      Height:        Intake/Output Summary (Last 24 hours) at 06/30/2019 1108 Last data filed at 06/30/2019 1000 Gross per 24 hour  Intake 5539.65 ml  Output 2100 ml  Net 3439.65 ml   Filed Weights   06/28/19 0515 06/29/19 0500 06/30/19 0500  Weight: 99.8 kg 99.9 kg 102.9 kg    Exam:  . General: 59 y.o. year-old male Pleasant, well developped well nourished. no acute distress. A&O x3 . Cardiovascular: Tachycardic no rubs or gallops . Respiratory: Rales, mild, at bases no wheezing. Poor inspiratory effort .  Abdomen:Obese NT NBS .  Musculoskeletal: No LE edema x 2 . Psychiatry: Mood is appropriate  Data Reviewed: CBC: Recent Labs  Lab 06/26/19 0210 06/26/19 0621 06/27/19 0317 06/27/19 1930 06/28/19 0316 06/29/19 0212 06/29/19 1054 06/30/19 0156 06/30/19 0839  WBC 9.4  --  9.0  --  9.3 10.0  --  8.5  --   NEUTROABS 7.4  --  6.8  --  6.6 7.4  --  6.4  --   HGB 7.0*   < > 7.6*   < > 8.8* 7.7* 7.5* 6.9* 7.4*  HCT 25.0*   < > 27.7*   < > 31.1* 27.8* 26.0* 24.6* 26.2*  MCV 90.6  --  90.8  --  91.2 93.3  --  92.8  --   PLT 228  --  225   --  229 206  --  159  --    < > = values in this interval not displayed.   Basic Metabolic Panel: Recent Labs  Lab 06/24/2019 0216 06/15/2019 0216 06/26/19 0210 06/26/19 1829 06/27/19 9371 06/27/19 1716 06/28/19 0316 06/29/19 0212 06/29/19 1054 06/30/19 0156  NA 154*   < > 145   < > 148* 146* 145 148*  --  144  K 4.2   < > 3.2*   < > 3.6 3.7 3.6 3.5  --  3.4*  CL 119*   < > 113*   < > 113* 112* 110 112*  --  112*  CO2 22   < > 21*   < > 24 23 22 24   --  23  GLUCOSE 113*   < > 151*   < > 128* 136* 124* 129*  --  214*  BUN 57*   < > 44*   < > 41* 37* 39* 44*  --  52*  CREATININE 3.82*   < > 3.32*   < > 3.20* 3.14* 3.36* 3.52*  --  3.33*  CALCIUM 8.6*   < > 9.0   < > 9.4 9.2 9.1 9.1  --  9.4  MG 2.1  --  1.9  --   --   --  2.0  --   --   --   PHOS 5.7*  --   --   --   --   --   --   --  5.1*  --    < > = values in this interval not displayed.   GFR: Estimated Creatinine Clearance: 29.3 mL/min (A) (by C-G formula based on SCr of 3.33 mg/dL (H)). Liver Function Tests: Recent Labs  Lab 06/21/2019 1714 06/24/19 0858 07/13/2019 0216 06/26/19 0210 06/27/19 0317  AST 33 28 29 42* 40  ALT 28 23 22 26 26   ALKPHOS 224* 186* 158* 144* 158*  BILITOT 0.8 1.0 0.5 0.5 0.8  PROT 7.3 6.5 6.1* 6.0* 5.9*  ALBUMIN 2.2* 2.0* 2.0* 1.6* 1.7*   No results for input(s): LIPASE, AMYLASE in the last 168 hours. Recent Labs  Lab 07/04/2019 1714  AMMONIA 14   Coagulation Profile: No results for input(s): INR, PROTIME in the last 168 hours. Cardiac Enzymes: No results for input(s): CKTOTAL, CKMB, CKMBINDEX, TROPONINI in the last 168 hours. BNP (last 3 results) No results for input(s): PROBNP in the last 8760 hours. HbA1C: No results for input(s): HGBA1C in the last 72 hours. CBG: Recent Labs  Lab 06/29/19 0523 06/29/19 1129 06/29/19 1617 06/29/19 2311 06/30/19 0514  GLUCAP 141* 239* 210* 199* 181*   Lipid Profile: No results for input(s): CHOL, HDL,  LDLCALC, TRIG, CHOLHDL, LDLDIRECT in the  last 72 hours. Thyroid Function Tests: No results for input(s): TSH, T4TOTAL, FREET4, T3FREE, THYROIDAB in the last 72 hours. Anemia Panel: No results for input(s): VITAMINB12, FOLATE, FERRITIN, TIBC, IRON, RETICCTPCT in the last 72 hours. Urine analysis:    Component Value Date/Time   COLORURINE YELLOW 06/16/2019 1537   APPEARANCEUR CLEAR 07/06/2019 1537   LABSPEC 1.006 06/16/2019 1537   PHURINE 6.0 06/16/2019 1537   GLUCOSEU NEGATIVE 07/10/2019 1537   HGBUR MODERATE (A) 06/21/2019 1537   BILIRUBINUR NEGATIVE 06/20/2019 1537   KETONESUR NEGATIVE 07/05/2019 1537   PROTEINUR 30 (A) 06/17/2019 1537   NITRITE NEGATIVE 06/24/2019 1537   LEUKOCYTESUR LARGE (A) 06/17/2019 1537   Sepsis Labs: @LABRCNTIP (procalcitonin:4,lacticidven:4)  ) Recent Results (from the past 240 hour(s))  Urine culture     Status: Abnormal   Collection Time: 06/15/2019  3:37 PM   Specimen: Urine, Random  Result Value Ref Range Status   Specimen Description   Final    URINE, RANDOM Performed at [facility], [place] [address] [place], [place] [address]    Special Requests   Final    NONE Performed at [facility], [place] [address] [place], [place] [address]    Culture MULTIPLE SPECIES PRESENT, SUGGEST RECOLLECTION (A)  Final   Report Status 06/24/2019 FINAL  Final  Culture, blood (routine x 2)     Status: Abnormal   Collection Time: 06/27/2019  4:56 PM   Specimen: BLOOD  Result Value Ref Range Status   Specimen Description   Final    BLOOD RIGHT ANTECUBITAL Performed at [facility] [address] [place], [place] [address]    Special Requests   Final    BOTTLES DRAWN AEROBIC ONLY Blood Culture results may not be optimal due to an excessive volume of blood received in culture bottles Performed at [facility] [address], [place], [place] [address]    Culture  Setup Time   Final    AEROBIC BOTTLE ONLY GRAM POSITIVE COCCI IN  CHAINS CRITICAL RESULT CALLED TO, READ BACK BY AND VERIFIED WITH: [facility] 06/19/2019 0032 JDW Performed at [facility], [place] [address], [place], [place] [address]    Culture ENTEROCOCCUS FAECALIS (A)  Final   Report Status 06/26/2019 FINAL  Final   Organism ID, Bacteria ENTEROCOCCUS FAECALIS  Final      Susceptibility   Enterococcus faecalis - MIC*    AMPICILLIN <=2 SENSITIVE Sensitive     VANCOMYCIN 1 SENSITIVE Sensitive     GENTAMICIN SYNERGY SENSITIVE Sensitive     * ENTEROCOCCUS FAECALIS  Blood Culture ID Panel (Reflexed)     Status: Abnormal   Collection Time: 06/18/2019  4:56 PM  Result Value Ref Range Status   Enterococcus species DETECTED (A) NOT DETECTED Final    Comment: CRITICAL RESULT CALLED TO, READ BACK BY AND VERIFIED WITH: J. GR[facility] PHARMD 06/25/19 0032 JDW    Vancomycin resistance NOT DETECTED NOT DETECTED Final   Listeria monocytogenes NOT DETECTED NOT DETECTED Final   Staphylococcus species NOT DETECTED NOT DETECTED Final   Staphylococcus aureus (BCID) NOT DETECTED NOT DETECTED Final   Streptococcus species NOT DETECTED NOT DETECTED Final   Streptococcus agalactiae NOT DETECTED NOT DETECTED Final   Streptococcus pneumoniae NOT DETECTED NOT DETECTED Final   Streptococcus pyogenes NOT DETECTED NOT DETECTED Final   Acinetobacter baumannii NOT DETECTED NOT DETECTED Final   Enterobacteriaceae species NOT DETECTED NOT DETECTED  Final   Enterobacter cloacae complex NOT DETECTED NOT DETECTED Final   Escherichia coli NOT DETECTED NOT DETECTED Final   Klebsiella oxytoca NOT DETECTED NOT DETECTED Final   Klebsiella pneumoniae NOT DETECTED NOT DETECTED Final   Proteus species NOT DETECTED NOT DETECTED Final   Serratia marcescens NOT DETECTED NOT DETECTED Final   Haemophilus influenzae NOT DETECTED NOT DETECTED Final   Neisseria meningitidis NOT DETECTED NOT DETECTED Final   Pseudomonas aeruginosa NOT DETECTED NOT DETECTED Final   Candida albicans NOT DETECTED  NOT DETECTED Final   Candida glabrata NOT DETECTED NOT DETECTED Final   Candida krusei NOT DETECTED NOT DETECTED Final   Candida parapsilosis NOT DETECTED NOT DETECTED Final   Candida tropicalis NOT DETECTED NOT DETECTED Final    Comment: Performed at [facility], [place] [address], [place], [place] [address]  Culture, blood (routine x 2)     Status: Abnormal   Collection Time: 06/19/2019  5:16 PM   Specimen: BLOOD  Result Value Ref Range Status   Specimen Description   Final    BLOOD RIGHT ANTECUBITAL Performed at [facility] [address], [place], [place] [address]    Special Requests   Final    BOTTLES DRAWN AEROBIC AND ANAEROBIC Blood Culture adequate volume Performed at [facility] [address], [place], [place] [address]    Culture  Setup Time   Final    GRAM POSITIVE COCCI IN CHAINS IN BOTH AEROBIC AND ANAEROBIC BOTTLES CRITICAL RESULT CALLED TO, READ BACK BY AND VERIFIED WITH: PHARMD M RENZ [id number] MLM    Culture (A)  Final    ENTEROCOCCUS FAECALIS SUSCEPTIBILITIES PERFORMED ON PREVIOUS CULTURE WITHIN THE LAST 5 DAYS. Performed at [facility], [place] [address], [place], [place] [address]    Report Status 06/26/2019 FINAL  Final  SARS CORONAVIRUS 2 (TAT 6-24 HRS) Nasopharyngeal Nasopharyngeal Swab     Status: None   Collection Time: 06/30/2019  5:20 PM   Specimen: Nasopharyngeal Swab  Result Value Ref Range Status   SARS Coronavirus 2 NEGATIVE NEGATIVE Final    Comment: (NOTE) SARS-CoV-2 target nucleic acids are NOT DETECTED. The SARS-CoV-2 RNA is generally detectable in upper and lower respiratory specimens during the acute phase of infection. Negative results do not preclude SARS-CoV-2 infection, do not rule out co-infections with other pathogens, and should not be used as the sole basis for treatment or other patient management decisions. Negative results must be combined with clinical observations, patient  history, and epidemiological information. The expected result is Negative. Fact Sheet for Patients: SugarRoll.be Fact Sheet for Healthcare Providers: https://www.woods-mathews.com/ This test is not yet approved or cleared by the [place] FDA and  has been authorized for detection and/or diagnosis of SARS-CoV-2 by FDA under an Emergency Use Authorization (EUA). This EUA will remain  in effect (meaning this test can be used) for the duration of the COVID-19 declaration under Section 56 4(b)(1) of the Act, 21 U.S.C. section 360bbb-3(b)(1), unless the authorization is terminated or revoked sooner. Performed at [facility], [place] [address] [place],  [address]   MRSA PCR Screening     Status: None   Collection Time: 06/24/19  1:08 AM   Specimen: Nasal Mucosa; Nasopharyngeal  Result Value Ref Range Status   MRSA by PCR NEGATIVE NEGATIVE Final    Comment:        The GeneXpert MRSA Assay (FDA approved for NASAL specimens only), is one component  of a comprehensive MRSA colonization surveillance program. It is not intended to diagnose MRSA infection nor to guide or monitor treatment for MRSA infections. Performed at [facility], [place] [address] [place], [place] [address]   Culture, blood (routine x 2)     Status: None (Preliminary result)   Collection Time: 06/26/19  2:10 AM   Specimen: BLOOD  Result Value Ref Range Status   Specimen Description   Final    BLOOD RIGHT ARM Performed at [facility] [address] [place], [place] [address]    Special Requests   Final    BOTTLES DRAWN AEROBIC ONLY Blood Culture adequate volume Performed at [facility] [address] [place], [place] [address]    Culture   Final    NO GROWTH 4 DAYS Performed at [facility], [place] [address], [place], [place] [address]    Report Status PENDING  Incomplete  Culture, blood  (routine x 2)     Status: None (Preliminary result)   Collection Time: 06/26/19  2:10 AM   Specimen: BLOOD  Result Value Ref Range Status   Specimen Description   Final    BLOOD RIGHT HAND Performed at [facility] [address], [place], [place] [address]    Special Requests   Final    BOTTLES DRAWN AEROBIC ONLY Blood Culture adequate volume Performed at [facility] [address] [place], [place] [address]    Culture   Final    NO GROWTH 4 DAYS Performed at [facility], [place] [address], [place], [place] [address]    Report Status PENDING  Incomplete      Studies: CT ABDOMEN PELVIS WO CONTRAST  Result Date: 06/29/2019 CLINICAL DATA:  Stage IV prostate carcinoma. Chronic renal disease. Generalized weakness. Painful urination. Foley catheter placed upon admission. Anemia. EXAM: CT CHEST, ABDOMEN AND PELVIS WITHOUT CONTRAST TECHNIQUE: Multidetector CT imaging of the chest, abdomen and pelvis was performed following the standard protocol without IV contrast. COMPARISON:  CT 06/29/2019, bone scan 12/23/2018 CT FINDINGS: CT CHEST FINDINGS Cardiovascular: No significant vascular findings. Normal heart size. No pericardial effusion. Mediastinum/Nodes: No axillary supraclavicular adenopathy. No mediastinal hilar adenopathy. No pericardial effusion Lungs/Pleura: Bilateral pleural effusions slightly increased from prior exam. There is a bibasilar atelectasis also mildly increased in LEFT lower lobe. No suspicious pulmonary nodules. 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 Several lesions are expansile within the ribs. For example the LEFT 5th rib (image 60/6) CT ABDOMEN AND PELVIS FINDINGS Hepatobiliary: No focal hepatic lesion on noncontrast exam. Gallstone within neck of the gallbladder. No gallbladder distension Pancreas: Pancreas is normal. No ductal dilatation. No pancreatic inflammation. Spleen: Normal  spleen Adrenals/urinary tract: Adrenal glands are normal. There is bilateral severe hydronephrosis and hydroureter. Hydroureter extends to the urinary bladder. Findings are similar to comparison CT 06/26/2019. There is interval placement of a Foley catheter. The previous described nodule thickening the bladder is not well appreciated. The bladder is thick-walled and collapsed. Stomach/Bowel: Stomach, small-bowel and cecum are normal. The appendix is not identified but there is no pericecal inflammation to suggest appendicitis. The colon and rectosigmoid colon are normal. Rectal thermometer noted Vascular/Lymphatic: Abdominal aorta is normal caliber with atherosclerotic calcification. There is no retroperitoneal or periportal lymphadenopathy. No pelvic lymphadenopathy. Reproductive: Prostate normal Other: No free fluid. Musculoskeletal: Widespread sclerotic skeletal metastasis unchanged. Pathologic fracture identified. IMPRESSION: Chest Impression: 1. No evidence of thoracic metastasis. 2.  Interval increase in moderate effusions and basilar atelectasis. 3. Widespread skeletal metastasis Abdomen / Pelvis Impression: 1. Persistent bilateral hydronephrosis and hydroureter to the level of the bladder. Interval placement Foley catheter within thickened high-density bladder. 2. No inflammation or infection identified in the abdomen pelvis. 3. No source of hemorrhage other than high-density thickening within the bladder. Electronically Signed   By: Suzy Bouchard M.D.   On: 06/29/2019 13:05   CT CHEST WO CONTRAST  Result Date: 06/29/2019 CLINICAL DATA:  Stage IV prostate carcinoma. Chronic renal disease. Generalized weakness. Painful urination. Foley catheter placed upon admission. Anemia. EXAM: CT CHEST, ABDOMEN AND PELVIS WITHOUT CONTRAST TECHNIQUE: Multidetector CT imaging of the chest, abdomen and pelvis was performed following the standard protocol without IV contrast. COMPARISON:  CT 06/20/2019, bone scan  12/23/2018 CT FINDINGS: CT CHEST FINDINGS Cardiovascular: No significant vascular findings. Normal heart size. No pericardial effusion. Mediastinum/Nodes: No axillary supraclavicular adenopathy. No mediastinal hilar adenopathy. No pericardial effusion Lungs/Pleura: Bilateral pleural effusions slightly increased from prior exam. There is a bibasilar atelectasis also mildly increased in LEFT lower lobe. No suspicious pulmonary nodules. 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 Several lesions are expansile within the ribs. For example the LEFT 5th rib (image 60/6) CT ABDOMEN AND PELVIS FINDINGS Hepatobiliary: No focal hepatic lesion on noncontrast exam. Gallstone within neck of the gallbladder. No gallbladder distension Pancreas: Pancreas is normal. No ductal dilatation. No pancreatic inflammation. Spleen: Normal spleen Adrenals/urinary tract: Adrenal glands are normal. There is bilateral severe hydronephrosis and hydroureter. Hydroureter extends to the urinary bladder. Findings are similar to comparison CT 06/22/2019. There is interval placement of a Foley catheter. The previous described nodule thickening the bladder is not well appreciated. The bladder is thick-walled and collapsed. Stomach/Bowel: Stomach, small-bowel and cecum are normal. The appendix is not identified but there is no pericecal inflammation to suggest appendicitis. The colon and rectosigmoid colon are normal. Rectal thermometer noted Vascular/Lymphatic: Abdominal aorta is normal caliber with atherosclerotic calcification. There is no retroperitoneal or periportal lymphadenopathy. No pelvic lymphadenopathy. Reproductive: Prostate normal Other: No free fluid. Musculoskeletal: Widespread sclerotic skeletal metastasis unchanged. Pathologic fracture identified. IMPRESSION: Chest Impression: 1. No evidence of thoracic metastasis. 2. Interval increase in moderate effusions and basilar atelectasis. 3.  Widespread skeletal metastasis Abdomen / Pelvis Impression: 1. Persistent bilateral hydronephrosis and hydroureter to the level of the bladder. Interval placement Foley catheter within thickened high-density bladder. 2. No inflammation or infection identified in the abdomen pelvis. 3. No source of hemorrhage other than high-density thickening within the bladder. Electronically Signed   By: Suzy Bouchard M.D.   On: 06/29/2019 13:05    Scheduled Meds: . Chlorhexidine Gluconate Cloth  6 each Topical Daily  . feeding supplement (ENSURE ENLIVE)  237 mL Oral TID BM  . feeding supplement (PRO-STAT SUGAR FREE 64)  30 mL Oral BID  . gabapentin  100 mg Oral BID  . insulin aspart  0-9 Units Subcutaneous Q6H  . mouth rinse  15 mL Mouth Rinse BID  . metoprolol tartrate  12.5 mg Oral BID  . multivitamin with minerals  1 tablet Oral Daily  . pantoprazole  40 mg Oral Q0600  . protein supplement  1 Scoop Oral TID WC    Continuous Infusions: . ampicillin (OMNIPEN) IV Stopped (06/30/19 6433)  . azithromycin Stopped (06/30/19 2951)  . cefTRIAXone (ROCEPHIN)  IV Stopped (06/30/19 0803)  . lactated ringers 75 mL/hr at 06/30/19 0700     LOS: 7 days  Kayleen Memos, MD [facility] Pager [phone]  If 7PM-7AM, please contact night-coverage [email or web address] Password [facility] 06/30/2019, 11:08 AM

## 2019-06-30 NOTE — Progress Notes (Signed)
Spoke with Sharlet Salina, NP and  NP gave order to pre medicate patient with 1000 mg of tylenol prior to starting blood transfusion.

## 2019-07-01 DIAGNOSIS — N390 Urinary tract infection, site not specified: Secondary | ICD-10-CM

## 2019-07-01 DIAGNOSIS — E1122 Type 2 diabetes mellitus with diabetic chronic kidney disease: Secondary | ICD-10-CM

## 2019-07-01 DIAGNOSIS — N183 Chronic kidney disease, stage 3 unspecified: Secondary | ICD-10-CM

## 2019-07-01 LAB — CBC WITH DIFFERENTIAL/PLATELET
Abs Immature Granulocytes: 0.36 10*3/uL — ABNORMAL HIGH (ref 0.00–0.07)
Basophils Absolute: 0 10*3/uL (ref 0.0–0.1)
Basophils Relative: 0 %
Eosinophils Absolute: 0.2 10*3/uL (ref 0.0–0.5)
Eosinophils Relative: 2 %
HCT: 26.3 % — ABNORMAL LOW (ref 39.0–52.0)
Hemoglobin: 7.4 g/dL — ABNORMAL LOW (ref 13.0–17.0)
Immature Granulocytes: 5 %
Lymphocytes Relative: 16 %
Lymphs Abs: 1.1 10*3/uL (ref 0.7–4.0)
MCH: 26.1 pg (ref 26.0–34.0)
MCHC: 28.1 g/dL — ABNORMAL LOW (ref 30.0–36.0)
MCV: 92.9 fL (ref 80.0–100.0)
Monocytes Absolute: 0.4 10*3/uL (ref 0.1–1.0)
Monocytes Relative: 5 %
Neutro Abs: 5 10*3/uL (ref 1.7–7.7)
Neutrophils Relative %: 72 %
Platelets: 156 10*3/uL (ref 150–400)
RBC: 2.83 MIL/uL — ABNORMAL LOW (ref 4.22–5.81)
RDW: 18 % — ABNORMAL HIGH (ref 11.5–15.5)
WBC: 7 10*3/uL (ref 4.0–10.5)
nRBC: 0 % (ref 0.0–0.2)

## 2019-07-01 LAB — TYPE AND SCREEN
ABO/RH(D): O POS
Antibody Screen: NEGATIVE
Unit division: 0
Unit division: 0

## 2019-07-01 LAB — BPAM RBC
Blood Product Expiration Date: 202104142359
Blood Product Expiration Date: 202104152359
ISSUE DATE / TIME: 202103142051
ISSUE DATE / TIME: 202103170426
Unit Type and Rh: 5100
Unit Type and Rh: 5100

## 2019-07-01 LAB — CULTURE, BLOOD (ROUTINE X 2)
Culture: NO GROWTH
Culture: NO GROWTH
Special Requests: ADEQUATE
Special Requests: ADEQUATE

## 2019-07-01 LAB — BASIC METABOLIC PANEL
Anion gap: 11 (ref 5–15)
BUN: 53 mg/dL — ABNORMAL HIGH (ref 6–20)
CO2: 24 mmol/L (ref 22–32)
Calcium: 9.3 mg/dL (ref 8.9–10.3)
Chloride: 111 mmol/L (ref 98–111)
Creatinine, Ser: 3.1 mg/dL — ABNORMAL HIGH (ref 0.61–1.24)
GFR calc Af Amer: 25 mL/min — ABNORMAL LOW (ref >60)
GFR calc non Af Amer: 21 mL/min — ABNORMAL LOW (ref >60)
Glucose, Bld: 214 mg/dL — ABNORMAL HIGH (ref 70–99)
Potassium: 3.4 mmol/L — ABNORMAL LOW (ref 3.5–5.1)
Sodium: 146 mmol/L — ABNORMAL HIGH (ref 135–145)

## 2019-07-01 LAB — HEPATIC FUNCTION PANEL
ALT: 21 U/L (ref 0–44)
AST: 35 U/L (ref 15–41)
Albumin: 1.5 g/dL — ABNORMAL LOW (ref 3.5–5.0)
Alkaline Phosphatase: 145 U/L — ABNORMAL HIGH (ref 38–126)
Bilirubin, Direct: <0.1 mg/dL (ref 0.0–0.2)
Total Bilirubin: 0.3 mg/dL (ref 0.3–1.2)
Total Protein: 5.5 g/dL — ABNORMAL LOW (ref 6.5–8.1)

## 2019-07-01 LAB — GLUCOSE, CAPILLARY
Glucose-Capillary: 125 mg/dL — ABNORMAL HIGH (ref 70–99)
Glucose-Capillary: 135 mg/dL — ABNORMAL HIGH (ref 70–99)
Glucose-Capillary: 167 mg/dL — ABNORMAL HIGH (ref 70–99)
Glucose-Capillary: 192 mg/dL — ABNORMAL HIGH (ref 70–99)
Glucose-Capillary: 220 mg/dL — ABNORMAL HIGH (ref 70–99)

## 2019-07-01 MED ORDER — POTASSIUM CHLORIDE 10 MEQ/100ML IV SOLN
10.0000 meq | INTRAVENOUS | Status: AC
  Administered 2019-07-01: 12:00:00 10 meq via INTRAVENOUS
  Filled 2019-07-01: qty 100

## 2019-07-01 MED ORDER — POTASSIUM CHLORIDE CRYS ER 20 MEQ PO TBCR
20.0000 meq | EXTENDED_RELEASE_TABLET | Freq: Once | ORAL | Status: DC
  Filled 2019-07-01: qty 1

## 2019-07-01 NOTE — Progress Notes (Signed)
Pt refused to swallow potassium pill.  On call NP notified.  Will await MD rounding soon.

## 2019-07-01 NOTE — Progress Notes (Signed)
Admit: 06/30/2019 LOS: 8  26M metastatic prostate cancer, DM2, admitted with symptomatic anemia likely GI source; AoCKD3 with progressive bilateral hydronephrosis with enlarging prostate mass  Subjective:  . Alone in room this morning, he is undecided about nephrostomy tubes . Excellent urine output . Creatinine slightly improved, K3.4, HCO3 24  03/17 0701 - 03/18 0700 In: 3040.1 [P.O.:800; I.V.:1708.8; IV Piggyback:531.2] Out: 4000 [Urine:4000]  Filed Weights   06/28/19 0515 06/29/19 0500 06/30/19 0500  Weight: 99.8 kg 99.9 kg 102.9 kg    Scheduled Meds: . Chlorhexidine Gluconate Cloth  6 each Topical Daily  . feeding supplement (ENSURE ENLIVE)  237 mL Oral TID BM  . feeding supplement (PRO-STAT SUGAR FREE 64)  30 mL Oral BID  . gabapentin  100 mg Oral BID  . insulin aspart  0-9 Units Subcutaneous Q6H  . mouth rinse  15 mL Mouth Rinse BID  . metoprolol tartrate  12.5 mg Oral BID  . multivitamin with minerals  1 tablet Oral Daily  . pantoprazole  40 mg Oral Q0600  . protein supplement  1 Scoop Oral TID WC   Continuous Infusions: . ampicillin (OMNIPEN) IV Stopped (07/01/19 0552)  . azithromycin Stopped (07/01/19 1025)  . cefTRIAXone (ROCEPHIN)  IV Stopped (07/01/19 1025)  . lactated ringers 75 mL/hr at 07/01/19 0600  . potassium chloride 10 mEq (07/01/19 1211)   PRN Meds:.[DISCONTINUED] acetaminophen **OR** acetaminophen, acetaminophen, fentaNYL (SUBLIMAZE) injection, metoprolol tartrate, ondansetron **OR** ondansetron (ZOFRAN) IV, oxyCODONE, sodium chloride  Current Labs: reviewed    Physical Exam:  Blood pressure (!) 153/117, pulse (!) 127, temperature 100.1 F (37.8 C), temperature source Oral, resp. rate (!) 30, height 5\' 9"  (1.753 m), weight 102.9 kg, SpO2 91 %. Chronically ill-appearing, NAD, conversant Tachycardic, regular, no rub Clear bilaterally Trace edema bilaterally Soft nontender, no suprapubic fullness  A 1. AoCKD3: obstructive from progressive  metastatic prostate cancer with hydronephrosis; robust urine output after placement of Foley catheter suspect some solute diuresis and impaired tubular function; will need long-term relief of obstruction, per urology, but suspect that bilateral percutaneous nephrostomy tubes would be best which patient has declined to date -- discussd 3/17 with family and recommended PCNs no matter if aggressive or palliative course from here; sees Dr. Justin Mend at [facility] 2. Enterococcal UTI and bacteremia with sepsis, per TRH and ID; ampicillin; to have TEE 3/18 3. Metastatic prostate cancer, progressive 4. Anemia, possible GI source, GI following, hemoglobin 7.4 this morning 5. DM2 6. Mild hypernatremia; iimproved with IVFs  P . Stop LR given robust U OP and taking in good p.o. fluids . Not much else to offer at the current time, he is slowly improving, globally I am not sure how much better he will get . Cont supportive care . We will step back for now; let us know how we can be of help and we will be available.  Already has appointment with Dr. Justin Mend on 4/5 . Daily weights, Daily Renal Panel, Strict I/Os, Avoid nephrotoxins (NSAIDs, judicious IV Contrast)    Pearson Grippe MD 07/01/2019, 12:58 PM  Recent Labs  Lab 06/19/2019 0216 06/26/19 0210 06/29/19 4854 06/29/19 1054 06/30/19 0156 07/01/19 0209  NA 154*   < > 148*  --  144 146*  K 4.2   < > 3.5  --  3.4* 3.4*  CL 119*   < > 112*  --  112* 111  CO2 22   < > 24  --  23 24  GLUCOSE 113*   < > 129*  --  214* 214*  BUN 57*   < > 44*  --  52* 53*  CREATININE 3.82*   < > 3.52*  --  3.33* 3.10*  CALCIUM 8.6*   < > 9.1  --  9.4 9.3  PHOS 5.7*  --   --  5.1*  --   --    < > = values in this interval not displayed.   Recent Labs  Lab 06/29/19 0212 06/29/19 1054 06/30/19 0156 06/30/19 0839 07/01/19 0209  WBC 10.0  --  8.5  --  7.0  NEUTROABS 7.4  --  6.4  --  5.0  HGB 7.7*   < > 6.9* 7.4* 7.4*  HCT 27.8*   < > 24.6* 26.2* 26.3*  MCV 93.3  --  92.8  --   92.9  PLT 206  --  159  --  156   < > = values in this interval not displayed.

## 2019-07-01 NOTE — Progress Notes (Signed)
INFECTIOUS DISEASE PROGRESS NOTE  ID: Jeffery [name] is a 59 y.o. male with  Principal Problem:   Severe sepsis ([place]) Active Problems:   Urinary retention   AKI (acute kidney injury) ([place])   Cholelithiasis   Diabetes mellitus, new onset ([place])   DM2 (diabetes mellitus, type 2) (H[facility])   CKD (chronic kidney disease) stage 3, GFR 30-59 ml/min   Prostate cancer ([facility])   GI bleed   Heme positive stool   Anemia due to chronic blood loss   Anemia   Enterococcal bacteremia   Palliative care by specialist   Goals of care, counseling/discussion   Metabolic encephalopathy   Pleural effusion  Subjective: Tempt to 103.5 yesterday No complaints.  Denis diarrhea   Abtx:  Anti-infectives (From admission, onward)   Start     Dose/Rate Route Frequency Ordered Stop   06/30/19 0615  cefTRIAXone (ROCEPHIN) 2 g in sodium chloride 0.9 % 100 mL IVPB     2 g 200 mL/hr over 30 Minutes Intravenous Daily 06/30/19 0541     06/30/19 0615  azithromycin (ZITHROMAX) 500 mg in sodium chloride 0.9 % 250 mL IVPB     500 mg 250 mL/hr over 60 Minutes Intravenous Daily 06/30/19 0542     06/27/19 1800  cefTRIAXone (ROCEPHIN) 2 g in sodium chloride 0.9 % 100 mL IVPB  Status:  Discontinued     2 g 200 mL/hr over 30 Minutes Intravenous Every 24 hours 06/27/19 1635 06/28/19 1426   07/10/2019 1400  ampicillin (OMNIPEN) 2 g in sodium chloride 0.9 % 100 mL IVPB     2 g 300 mL/hr over 20 Minutes Intravenous Every 8 hours 07/10/2019 0612     06/21/2019 0115  ampicillin (OMNIPEN) 2 g in sodium chloride 0.9 % 100 mL IVPB  Status:  Discontinued     2 g 300 mL/hr over 20 Minutes Intravenous Every 6 hours 07/05/2019 0107 07/05/2019 0611   06/24/19 0800  cefTRIAXone (ROCEPHIN) 2 g in sodium chloride 0.9 % 100 mL IVPB  Status:  Discontinued     2 g 200 mL/hr over 30 Minutes Intravenous Every 24 hours 06/24/19 0722 06/24/2019 0104   07/04/2019 1700  cefTRIAXone (ROCEPHIN) 1 g in sodium chloride 0.9 % 100 mL IVPB     1 g 200 mL/hr  over 30 Minutes Intravenous  Once 06/17/2019 1652 07/10/2019 1949      Medications:  Scheduled:  Chlorhexidine Gluconate Cloth  6 each Topical Daily   feeding supplement (ENSURE ENLIVE)  237 mL Oral TID BM   feeding supplement (PRO-STAT SUGAR FREE 64)  30 mL Oral BID   gabapentin  100 mg Oral BID   insulin aspart  0-9 Units Subcutaneous Q6H   mouth rinse  15 mL Mouth Rinse BID   metoprolol tartrate  12.5 mg Oral BID   multivitamin with minerals  1 tablet Oral Daily   pantoprazole  40 mg Oral Q0600   protein supplement  1 Scoop Oral TID WC    Objective: Vital signs in last 24 hours: Temp:  [97.6 F (36.4 C)-103.5 F (39.7 C)] 100.1 F (37.8 C) (03/18 0800) Pulse Rate:  [102-145] 122 (03/18 0600) Resp:  [9-31] 24 (03/18 0600) BP: (85-171)/(51-118) 143/72 (03/18 0600) SpO2:  [92 %-100 %] 94 % (03/18 0600)   General appearance: alert, cooperative and fatigued Resp: clear to auscultation bilaterally and tachypnea Cardio: regularly irregular rhythm GI: normal findings: bowel sounds normal and soft, non-tender Extremities: edema none  Lab Results Recent Labs  06/30/19 0156 06/30/19 0156 06/30/19 0839 07/01/19 0209  WBC 8.5  --   --  7.0  HGB 6.9*   < > 7.4* 7.4*  HCT 24.6*   < > 26.2* 26.3*  NA 144  --   --  146*  K 3.4*  --   --  3.4*  CL 112*  --   --  111  CO2 23  --   --  24  BUN 52*  --   --  53*  CREATININE 3.33*  --   --  3.10*   < > = values in this interval not displayed.   Liver Panel Recent Labs    07/01/19 0209  PROT 5.5*  ALBUMIN 1.5*  AST 35  ALT 21  ALKPHOS 145*  BILITOT 0.3  BILIDIR <0.1  IBILI NOT CALCULATED   Sedimentation Rate No results for input(s): ESRSEDRATE in the last 72 hours. C-Reactive Protein No results for input(s): CRP in the last 72 hours.  Microbiology: Recent Results (from the past 240 hour(s))  Urine culture     Status: Abnormal   Collection Time: 07/10/2019  3:37 PM   Specimen: Urine, Random  Result Value  Ref Range Status   Specimen Description   Final    URINE, RANDOM Performed at [facility] [address] [place], [place] [address]    Special Requests   Final    NONE Performed at [facility], [place] [address] [place], [place] [address]    Culture MULTIPLE SPECIES PRESENT, SUGGEST RECOLLECTION (A)  Final   Report Status 06/24/2019 FINAL  Final  Culture, blood (routine x 2)     Status: Abnormal   Collection Time: 07/01/2019  4:56 PM   Specimen: BLOOD  Result Value Ref Range Status   Specimen Description   Final    BLOOD RIGHT ANTECUBITAL Performed at [facility] [address], [place], [place] [address]    Special Requests   Final    BOTTLES DRAWN AEROBIC ONLY Blood Culture results may not be optimal due to an excessive volume of blood received in culture bottles Performed at [facility] [address] [place], [place] [address]    Culture  Setup Time   Final    AEROBIC BOTTLE ONLY GRAM POSITIVE COCCI IN CHAINS CRITICAL RESULT CALLED TO, READ BACK BY AND VERIFIED WITH: Lavell Luster [facility] 06/27/2019 0032 JDW Performed at [facility], [place] [address], [place], [place] [address]    Culture ENTEROCOCCUS FAECALIS (A)  Final   Report Status 06/26/2019 FINAL  Final   Organism ID, Bacteria ENTEROCOCCUS FAECALIS  Final      Susceptibility   Enterococcus faecalis - MIC*    AMPICILLIN <=2 SENSITIVE Sensitive     VANCOMYCIN 1 SENSITIVE Sensitive     GENTAMICIN SYNERGY SENSITIVE Sensitive     * ENTEROCOCCUS FAECALIS  Blood Culture ID Panel (Reflexed)     Status: Abnormal   Collection Time: 07/02/2019  4:56 PM  Result Value Ref Range Status   Enterococcus species DETECTED (A) NOT DETECTED Final    Comment: CRITICAL RESULT CALLED TO, READ BACK BY AND VERIFIED WITH: J. GRIMSLEY PHARMD 06/23/2019 0032 JDW    Vancomycin resistance NOT DETECTED NOT DETECTED Final   Listeria monocytogenes NOT DETECTED NOT  DETECTED Final   Staphylococcus species NOT DETECTED NOT DETECTED Final   Staphylococcus aureus (BCID) NOT DETECTED NOT DETECTED Final   Streptococcus species NOT DETECTED NOT DETECTED Final  Streptococcus agalactiae NOT DETECTED NOT DETECTED Final   Streptococcus pneumoniae NOT DETECTED NOT DETECTED Final   Streptococcus pyogenes NOT DETECTED NOT DETECTED Final   Acinetobacter baumannii NOT DETECTED NOT DETECTED Final   Enterobacteriaceae species NOT DETECTED NOT DETECTED Final   Enterobacter cloacae complex NOT DETECTED NOT DETECTED Final   Escherichia coli NOT DETECTED NOT DETECTED Final   Klebsiella oxytoca NOT DETECTED NOT DETECTED Final   Klebsiella pneumoniae NOT DETECTED NOT DETECTED Final   Proteus species NOT DETECTED NOT DETECTED Final   Serratia marcescens NOT DETECTED NOT DETECTED Final   Haemophilus influenzae NOT DETECTED NOT DETECTED Final   Neisseria meningitidis NOT DETECTED NOT DETECTED Final   Pseudomonas aeruginosa NOT DETECTED NOT DETECTED Final   Candida albicans NOT DETECTED NOT DETECTED Final   Candida glabrata NOT DETECTED NOT DETECTED Final   Candida krusei NOT DETECTED NOT DETECTED Final   Candida parapsilosis NOT DETECTED NOT DETECTED Final   Candida tropicalis NOT DETECTED NOT DETECTED Final    Comment: Performed at [facility], [place] [address] [place], [place] [address]  Culture, blood (routine x 2)     Status: Abnormal   Collection Time: 07/12/2019  5:16 PM   Specimen: BLOOD  Result Value Ref Range Status   Specimen Description   Final    BLOOD RIGHT ANTECUBITAL Performed at [facility] [address], [place], [place] [address]    Special Requests   Final    BOTTLES DRAWN AEROBIC AND ANAEROBIC Blood Culture adequate volume Performed at [facility] [address], [place], [place] [address]    Culture  Setup Time   Final    GRAM POSITIVE COCCI IN CHAINS IN BOTH AEROBIC AND ANAEROBIC BOTTLES CRITICAL  RESULT CALLED TO, READ BACK BY AND VERIFIED WITH: PHARMD M RENZ [id number] MLM    Culture (A)  Final    ENTEROCOCCUS FAECALIS SUSCEPTIBILITIES PERFORMED ON PREVIOUS CULTURE WITHIN THE LAST 5 DAYS. Performed at [facility], [place] [address] [place], [place] [address]    Report Status 06/26/2019 FINAL  Final  SARS CORONAVIRUS 2 (TAT 6-24 HRS) Nasopharyngeal Nasopharyngeal Swab     Status: None   Collection Time: 06/14/2019  5:20 PM   Specimen: Nasopharyngeal Swab  Result Value Ref Range Status   SARS Coronavirus 2 NEGATIVE NEGATIVE Final    Comment: (NOTE) SARS-CoV-2 target nucleic acids are NOT DETECTED. The SARS-CoV-2 RNA is generally detectable in upper and lower respiratory specimens during the acute phase of infection. Negative results do not preclude SARS-CoV-2 infection, do not rule out co-infections with other pathogens, and should not be used as the sole basis for treatment or other patient management decisions. Negative results must be combined with clinical observations, patient history, and epidemiological information. The expected result is Negative. Fact Sheet for Patients: SugarRoll.be Fact Sheet for Healthcare Providers: https://www.woods-mathews.com/ This test is not yet approved or cleared by the [place] FDA and  has been authorized for detection and/or diagnosis of SARS-CoV-2 by FDA under an Emergency Use Authorization (EUA). This EUA will remain  in effect (meaning this test can be used) for the duration of the COVID-19 declaration under Section 56 4(b)(1) of the Act, 21 U.S.C. section 360bbb-3(b)(1), unless the authorization is terminated or revoked sooner. Performed at [facility], [place] [address], [place], [place] [address]   MRSA PCR Screening     Status: None   Collection Time: 06/24/19  1:08 AM   Specimen:  Nasal Mucosa; Nasopharyngeal  Result Value Ref Range Status   MRSA by PCR NEGATIVE NEGATIVE  Final    Comment:        The GeneXpert MRSA Assay (FDA approved for NASAL specimens only), is one component of a comprehensive MRSA colonization surveillance program. It is not intended to diagnose MRSA infection nor to guide or monitor treatment for MRSA infections. Performed at [facility], [place] [address] [place], [place] [address]   Culture, blood (routine x 2)     Status: None   Collection Time: 06/26/19  2:10 AM   Specimen: BLOOD  Result Value Ref Range Status   Specimen Description   Final    BLOOD RIGHT ARM Performed at [facility] [address], [place], [place] [address]    Special Requests   Final    BOTTLES DRAWN AEROBIC ONLY Blood Culture adequate volume Performed at [facility] [address] [place], [place] [address]    Culture   Final    NO GROWTH 5 DAYS Performed at [facility], [place] [address] [place], [place] [address]    Report Status 07/01/2019 FINAL  Final  Culture, blood (routine x 2)     Status: None   Collection Time: 06/26/19  2:10 AM   Specimen: BLOOD  Result Value Ref Range Status   Specimen Description   Final    BLOOD RIGHT HAND Performed at [facility] [address], [place], [place] [address]    Special Requests   Final    BOTTLES DRAWN AEROBIC ONLY Blood Culture adequate volume Performed at [facility] [address] [place], [place] [address]    Culture   Final    NO GROWTH 5 DAYS Performed at [facility], [place] [address], [place], [place] [address]    Report Status 07/01/2019 FINAL  Final    Studies/Results: CT ABDOMEN PELVIS WO CONTRAST  Result Date: 06/29/2019 CLINICAL DATA:  Stage IV prostate carcinoma. Chronic renal disease. Generalized weakness. Painful urination. Foley catheter placed upon admission. Anemia. EXAM: CT CHEST, ABDOMEN AND PELVIS WITHOUT CONTRAST TECHNIQUE: Multidetector CT imaging of the  chest, abdomen and pelvis was performed following the standard protocol without IV contrast. COMPARISON:  CT 06/27/2019, bone scan 12/23/2018 CT FINDINGS: CT CHEST FINDINGS Cardiovascular: No significant vascular findings. Normal heart size. No pericardial effusion. Mediastinum/Nodes: No axillary supraclavicular adenopathy. No mediastinal hilar adenopathy. No pericardial effusion Lungs/Pleura: Bilateral pleural effusions slightly increased from prior exam. There is a bibasilar atelectasis also mildly increased in LEFT lower lobe. No suspicious pulmonary nodules. 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 Several lesions are expansile within the ribs. For example the LEFT 5th rib (image 60/6) CT ABDOMEN AND PELVIS FINDINGS Hepatobiliary: No focal hepatic lesion on noncontrast exam. Gallstone within neck of the gallbladder. No gallbladder distension Pancreas: Pancreas is normal. No ductal dilatation. No pancreatic inflammation. Spleen: Normal spleen Adrenals/urinary tract: Adrenal glands are normal. There is bilateral severe hydronephrosis and hydroureter. Hydroureter extends to the urinary bladder. Findings are similar to comparison CT 06/16/2019. There is interval placement of a Foley catheter. The previous described nodule thickening the bladder is not well appreciated. The bladder is thick-walled and collapsed. Stomach/Bowel: Stomach, small-bowel and cecum are normal. The appendix is not identified but there is no pericecal inflammation to suggest appendicitis. The colon and rectosigmoid colon are normal. Rectal thermometer noted Vascular/Lymphatic: Abdominal aorta is normal caliber with atherosclerotic  calcification. There is no retroperitoneal or periportal lymphadenopathy. No pelvic lymphadenopathy. Reproductive: Prostate normal Other: No free fluid. Musculoskeletal: Widespread sclerotic skeletal metastasis unchanged. Pathologic fracture identified. IMPRESSION: Chest  Impression: 1. No evidence of thoracic metastasis. 2. Interval increase in moderate effusions and basilar atelectasis. 3. Widespread skeletal metastasis Abdomen / Pelvis Impression: 1. Persistent bilateral hydronephrosis and hydroureter to the level of the bladder. Interval placement Foley catheter within thickened high-density bladder. 2. No inflammation or infection identified in the abdomen pelvis. 3. No source of hemorrhage other than high-density thickening within the bladder. Electronically Signed   By: Suzy Bouchard M.D.   On: 06/29/2019 13:05   CT CHEST WO CONTRAST  Result Date: 06/29/2019 CLINICAL DATA:  Stage IV prostate carcinoma. Chronic renal disease. Generalized weakness. Painful urination. Foley catheter placed upon admission. Anemia. EXAM: CT CHEST, ABDOMEN AND PELVIS WITHOUT CONTRAST TECHNIQUE: Multidetector CT imaging of the chest, abdomen and pelvis was performed following the standard protocol without IV contrast. COMPARISON:  CT 07/03/2019, bone scan 12/23/2018 CT FINDINGS: CT CHEST FINDINGS Cardiovascular: No significant vascular findings. Normal heart size. No pericardial effusion. Mediastinum/Nodes: No axillary supraclavicular adenopathy. No mediastinal hilar adenopathy. No pericardial effusion Lungs/Pleura: Bilateral pleural effusions slightly increased from prior exam. There is a bibasilar atelectasis also mildly increased in LEFT lower lobe. No suspicious pulmonary nodules. 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 Several lesions are expansile within the ribs. For example the LEFT 5th rib (image 60/6) CT ABDOMEN AND PELVIS FINDINGS Hepatobiliary: No focal hepatic lesion on noncontrast exam. Gallstone within neck of the gallbladder. No gallbladder distension Pancreas: Pancreas is normal. No ductal dilatation. No pancreatic inflammation. Spleen: Normal spleen Adrenals/urinary tract: Adrenal glands are normal. There is bilateral severe  hydronephrosis and hydroureter. Hydroureter extends to the urinary bladder. Findings are similar to comparison CT 07/07/2019. There is interval placement of a Foley catheter. The previous described nodule thickening the bladder is not well appreciated. The bladder is thick-walled and collapsed. Stomach/Bowel: Stomach, small-bowel and cecum are normal. The appendix is not identified but there is no pericecal inflammation to suggest appendicitis. The colon and rectosigmoid colon are normal. Rectal thermometer noted Vascular/Lymphatic: Abdominal aorta is normal caliber with atherosclerotic calcification. There is no retroperitoneal or periportal lymphadenopathy. No pelvic lymphadenopathy. Reproductive: Prostate normal Other: No free fluid. Musculoskeletal: Widespread sclerotic skeletal metastasis unchanged. Pathologic fracture identified. IMPRESSION: Chest Impression: 1. No evidence of thoracic metastasis. 2. Interval increase in moderate effusions and basilar atelectasis. 3. Widespread skeletal metastasis Abdomen / Pelvis Impression: 1. Persistent bilateral hydronephrosis and hydroureter to the level of the bladder. Interval placement Foley catheter within thickened high-density bladder. 2. No inflammation or infection identified in the abdomen pelvis. 3. No source of hemorrhage other than high-density thickening within the bladder. Electronically Signed   By: Suzy Bouchard M.D.   On: 06/29/2019 13:05     Assessment/Plan: Enterococcal bacteremia (amp-s) UTI, sepsis Pleural effusions DM2 Metastatic prostate Ca CKD3  Total days of antibiotics: 8  7 amp 1 ceftriaxone/azithro  Repeat BCx 3-13 (-) Await TEE- scheduled for 3-19 Would consider thoracentesis as well Fevers continue in late pm         Bobby Rumpf MD, FACP Infectious Diseases (pager) [phone] [email or web address] 07/01/2019, 10:33 AM  LOS: 8 days

## 2019-07-01 NOTE — TOC Initial Note (Signed)
Transition of Care [facility]) - Initial/Assessment Note    Patient Details  Name: Jeffery [name] MRN: [id number] Date of Birth: [birth year]  Transition [facility]) CM/SW Contact:    Bethann Berkshire, LCSW Phone Number: 07/01/2019, 11:17 AM  Clinical Narrative:          Met with patient and his mother, Vivi Ferns. Pt was quiet but responsive when asked direct questions. Spoke with pt and mother about possible discharge options including HH and SNF. Pt and mother preferred for Pt to return home where pt lives with mother and his son. DME at home includes hospital bed. Mother explained that she would be unable to take care of pt's personal care and that pt's son would not take care of patient either. CSW spoke further about SNF options. Pt mother preffered [facility]. Pt and mother agreed to allow CSW to pursue bed offers for SNF. TOC will continue following.            Expected Discharge Plan: Skilled Nursing Facility Barriers to Discharge: SNF Pending bed offer   Patient Goals and CMS Choice Patient states their goals for this hospitalization and ongoing recovery are:: Pt wants to go home CMS Medicare.gov Compare Post Acute Care list provided to:: Patient Choice offered to / list presented to : Patient, Parent  Expected Discharge Plan and Services Expected Discharge Plan: [place]   Discharge Planning Services: CM Consult Post Acute Care Choice: [place] Living arrangements for the past 2 months: Single Family Home                                      Prior Living Arrangements/Services Living arrangements for the past 2 months: Single Family Home Lives with:: Parents Patient language and need for interpreter reviewed:: Yes Do you feel safe going back to the place where you live?: Yes      Need for Family Participation in Patient Care: Yes (Comment) Care giver support system in place?: Yes (comment) Current home services: DME Criminal  Activity/Legal Involvement Pertinent to Current Situation/Hospitalization: No - Comment as needed  Activities of Daily Living Home Assistive Devices/Equipment: None, Other (Comment)(unknown) ADL Screening (condition at time of admission) Patient's cognitive ability adequate to safely complete daily activities?: Yes Is the patient deaf or have difficulty hearing?: No Does the patient have difficulty seeing, even when wearing glasses/contacts?: No Does the patient have difficulty concentrating, remembering, or making decisions?: Yes Patient able to express need for assistance with ADLs?: Yes Does the patient have difficulty dressing or bathing?: Yes Independently performs ADLs?: No Communication: Independent Dressing (OT): Needs assistance Is this a change from baseline?: Pre-admission baseline Grooming: Needs assistance Is this a change from baseline?: Pre-admission baseline Feeding: Needs assistance Is this a change from baseline?: Pre-admission baseline Bathing: Needs assistance Is this a change from baseline?: Pre-admission baseline Toileting: Needs assistance Is this a change from baseline?: Pre-admission baseline In/Out Bed: Needs assistance Is this a change from baseline?: Pre-admission baseline Walks in Home: Independent with device (comment) Does the patient have difficulty walking or climbing stairs?: Yes Weakness of Legs: Both Weakness of Arms/Hands: None  Permission Sought/Granted Permission sought to share information with : Family Supports Permission granted to share information with : Yes, Verbal Permission Granted  Share Information with NAME: Vivi Ferns     Permission granted to share info w Relationship: Mother  Permission granted to share  info w Contact Information: [phone]  Emotional Assessment Appearance:: Appears stated age Attitude/Demeanor/Rapport: Apprehensive Affect (typically observed): Flat Orientation: : Oriented to Place, Oriented to Self,  Oriented to  Time Alcohol / Substance Use: Not Applicable Psych Involvement: No (comment)  Admission diagnosis:  Metabolic encephalopathy [U98.11] Prostate cancer (HCC) [C61] GI bleed [K92.2] Hypoxia [R09.02] Left leg weakness [R29.898] AKI (acute kidney injury) ([place]) [N17.9] Urinary tract infection without hematuria, site unspecified [N39.0] Anemia, unspecified type [D64.9] Patient Active Problem List   Diagnosis Date Noted  . Metabolic encephalopathy   . Pleural effusion   . Palliative care by specialist   . Goals of care, counseling/discussion   . Anemia   . Enterococcal bacteremia   . Severe sepsis ([place]) 06/24/2019  . Heme positive stool   . Anemia due to chronic blood loss   . GI bleed 06/27/2019  . Seizure-like activity ([place]) 05/20/2019  . Metastatic cancer to spine ([place]) 04/20/2019  . Cervical radiculopathy 02/23/2019  . Pain in thoracic spine 02/23/2019  . Neck pain 02/18/2019  . Open fracture of base of distal phalanx of left thumb 02/04/2019  . Acute renal failure superimposed on stage 3 chronic kidney disease ([place]) 10/13/2018  . Vitamin B12 deficiency 10/13/2018  . Prostate cancer ([place]) 10/13/2018  . Acute cystitis with hematuria   . Acute hyperkalemia   . DM2 (diabetes mellitus, type 2) ([place]) 10/12/2018  . Dysuria 10/12/2018  . CKD (chronic kidney disease) stage 3, GFR 30-59 ml/min 10/12/2018  . Hyperkalemia 10/12/2018  . Metabolic acidosis, normal anion gap (NAG) 10/12/2018  . Sinus tachycardia 07/31/2016  . Diabetes mellitus, new onset ([place])   . Hyperglycemia 07/29/2016  . Urinary retention 07/29/2016  . AKI (acute kidney injury) ([place]) 07/29/2016  . Cholelithiasis 07/29/2016  . Fatty liver 07/29/2016  . Hydronephrosis 07/29/2016  . Hypertension 07/29/2016   PCP:  [name], MD Pharmacy:   [facility] [address] [place]), [place] [address] [place] [address] Phone: [phone] Fax:  [phone]  [place], [place] [address] Phone: [phone] Fax: [phone]  [id number] [id number]: [phone]: [phone]    Social Determinants of Health (SDOH) Interventions    Readmission Risk Interventions No flowsheet data found.

## 2019-07-01 NOTE — NC FL2 (Signed)
[place] LEVEL OF CARE SCREENING TOOL     IDENTIFICATION  Patient Name: Jeffery [name] Birthdate: Aug 11, 1960 Sex: male Admission Date (Current Location): 07/09/2019  [place] and [place] Number:  Kathleen Argue [phone] [place] and Address:         Provider Number: [id number]  Attending Physician Name and Address:  Kayleen Memos, DO  Relative Name and Phone Number:  Vivi Ferns Mother, [phone]    Current Level of Care: Hospital Recommended Level of Care: [place] Prior Approval Number:    Date Approved/Denied:   PASRR Number: [id number]  Discharge Plan: SNF    Current Diagnoses: Patient Active Problem List   Diagnosis Date Noted  . Metabolic encephalopathy   . Pleural effusion   . Palliative care by specialist   . Goals of care, counseling/discussion   . Anemia   . Enterococcal bacteremia   . Severe sepsis ([place]) 06/24/2019  . Heme positive stool   . Anemia due to chronic blood loss   . GI bleed 07/11/2019  . Seizure-like activity ([place]) 05/20/2019  . Metastatic cancer to spine ([place]) 04/20/2019  . Cervical radiculopathy 02/23/2019  . Pain in thoracic spine 02/23/2019  . Neck pain 02/18/2019  . Open fracture of base of distal phalanx of left thumb 02/04/2019  . Acute renal failure superimposed on stage 3 chronic kidney disease ([place]) 10/13/2018  . Vitamin B12 deficiency 10/13/2018  . Prostate cancer ([place]) 10/13/2018  . Acute cystitis with hematuria   . Acute hyperkalemia   . DM2 (diabetes mellitus, type 2) ([place]) 10/12/2018  . Dysuria 10/12/2018  . CKD (chronic kidney disease) stage 3, GFR 30-59 ml/min 10/12/2018  . Hyperkalemia 10/12/2018  . Metabolic acidosis, normal anion gap (NAG) 10/12/2018  . Sinus tachycardia 07/31/2016  . Diabetes mellitus, new onset ([place])   . Hyperglycemia 07/29/2016  . Urinary retention 07/29/2016  . AKI (acute kidney injury) ([place]) 07/29/2016  . Cholelithiasis 07/29/2016  . Fatty liver  07/29/2016  . Hydronephrosis 07/29/2016  . Hypertension 07/29/2016    Orientation RESPIRATION BLADDER Height & Weight     Self, Time, Place  O2(2LNC) Indwelling catheter Weight: 226 lb 13.7 oz (102.9 kg) Height:  5\' 9"  (175.3 cm)  BEHAVIORAL SYMPTOMS/MOOD NEUROLOGICAL BOWEL NUTRITION STATUS  Other (Comment)(none) (none) Incontinent Diet(see discharge summary)  AMBULATORY STATUS COMMUNICATION OF NEEDS Skin   Extensive Assist Verbally Normal                       Personal Care Assistance Level of Assistance  Bathing, Dressing, Feeding Bathing Assistance: Maximum assistance Feeding assistance: Limited assistance Dressing Assistance: Maximum assistance     Functional Limitations Info  Sight, Hearing, Speech Sight Info: Adequate Hearing Info: Adequate Speech Info: Adequate    SPECIAL CARE FACTORS FREQUENCY  PT (By licensed PT), OT (By licensed OT)     PT Frequency: 5x/w OT Frequency: 5x/w            Contractures Contractures Info: Not present    Additional Factors Info  Code Status, Allergies Code Status Info: Full Code Allergies Info: nka           Current Medications (07/01/2019):  This is the current hospital active medication list Current Facility-Administered Medications  Medication Dose Route Frequency Provider Last Rate Last Admin  . acetaminophen (TYLENOL) suppository 650 mg  650 mg Rectal Q6H PRN Lang Snow, FNP      . acetaminophen (TYLENOL) tablet 1,000 mg  1,000 mg Oral Q6H PRN Sharlet Salina,  Melanie, FNP   1,000 mg at 06/30/19 1557  . ampicillin (OMNIPEN) 2 g in sodium chloride 0.9 % 100 mL IVPB  2 g Intravenous Q8H Barb Merino, MD   Stopped at 07/01/19 [phone]  . azithromycin (ZITHROMAX) 500 mg in sodium chloride 0.9 % 250 mL IVPB  500 mg Intravenous Daily Irene Pap N, DO   Stopped at 07/01/19 1025  . cefTRIAXone (ROCEPHIN) 2 g in sodium chloride 0.9 % 100 mL IVPB  2 g Intravenous Daily Irene Pap N, DO   Stopped at 07/01/19 1025  .  Chlorhexidine Gluconate Cloth 2 % PADS 6 each  6 each Topical Daily Elwyn Reach, MD   6 each at 07/01/19 0931  . feeding supplement (ENSURE ENLIVE) (ENSURE ENLIVE) liquid 237 mL  237 mL Oral TID BM Irene Pap N, DO   237 mL at 06/30/19 2127  . feeding supplement (PRO-STAT SUGAR FREE 64) liquid 30 mL  30 mL Oral BID Irene Pap N, DO   30 mL at 06/30/19 1555  . fentaNYL (SUBLIMAZE) injection 25 mcg  25 mcg Intravenous Q2H PRN Micheline Rough, MD      . gabapentin (NEURONTIN) capsule 100 mg  100 mg Oral BID Irene Pap N, DO   100 mg at 07/01/19 0932  . insulin aspart (novoLOG) injection 0-9 Units  0-9 Units Subcutaneous Q6H Barb Merino, MD   2 Units at 07/01/19 [phone]  . lactated ringers infusion   Intravenous Continuous Pearson Grippe B, MD 75 mL/hr at 07/01/19 0600 Rate Verify at 07/01/19 0600  . MEDLINE mouth rinse  15 mL Mouth Rinse BID Gala Romney L, MD   15 mL at 06/30/19 0938  . metoprolol tartrate (LOPRESSOR) injection 2.5 mg  2.5 mg Intravenous Q4H PRN Irene Pap N, DO   2.5 mg at 06/29/19 1625  . metoprolol tartrate (LOPRESSOR) tablet 12.5 mg  12.5 mg Oral BID Irene Pap N, DO   12.5 mg at 07/01/19 0932  . multivitamin with minerals tablet 1 tablet  1 tablet Oral Daily Irene Pap N, DO   1 tablet at 07/01/19 0932  . ondansetron (ZOFRAN) tablet 4 mg  4 mg Oral Q6H PRN Elwyn Reach, MD       Or  . ondansetron (ZOFRAN) injection 4 mg  4 mg Intravenous Q6H PRN Gala Romney L, MD      . oxyCODONE (Oxy IR/ROXICODONE) immediate release tablet 5 mg  5 mg Oral Q6H PRN Barb Merino, MD   5 mg at 06/30/19 0825  . pantoprazole (PROTONIX) EC tablet 40 mg  40 mg Oral Q0600 Vena Rua, PA-C   40 mg at 07/01/19 0640  . protein supplement (RESOURCE BENEPROTEIN) powder packet 6 g  1 Scoop Oral TID WC Hall, Carole N, DO   6 g at 07/01/19 0931  . sodium chloride (OCEAN) 0.65 % nasal spray 1 spray  1 spray Each Nare PRN Barb Merino, MD         Discharge  Medications: Please see discharge summary for a list of discharge medications.  Relevant Imaging Results:  Relevant Lab Results:   Additional Information [id number] [place], [place]

## 2019-07-01 NOTE — Progress Notes (Signed)
    [facility] has been requested to perform a transesophageal echocardiogram on 06/18/2019 for bacteremia.  After careful review of history and examination, the risks and benefits of transesophageal echocardiogram have been explained including risks of esophageal damage, perforation (1:10,000 risk), bleeding, pharyngeal hematoma as well as other potential complications associated with conscious sedation including aspiration, arrhythmia, respiratory failure and death. Alternatives to treatment were discussed, questions were answered. Patient is willing to proceed.   TEE scheduled for 06/20/2019 at 11:30am with Dr. Harrell Gave.  Roby Lofts, PA-C 07/01/2019 9:45 AM

## 2019-07-01 NOTE — Progress Notes (Signed)
PROGRESS NOTE  Jeffery [name] QRF:[id number] DOB: 1960/04/28 DOA: 06/21/2019 PCP: [name], MD  HPI/Recap of past 24 hours: Patient is a 59 year old gentleman with PMH of stage IV prostate cancer with mets to bones, type II diabetes, polyneuropathy, hyperlipidemia, chronic kidney disease stage IIIB, anemia of chronic disease, chronic guaiac positive stools who presented to the emergency room with generalized weakness, hypoxia and tachycardia, painful urination.   He was working with therapies at home, he was found shaky and tremulous, oncology office was called who directed him to the ER.  Febrile tachycardic in the ED.  Hemoglobin 6.4.  Transfused 2 unit PRBCs.  Creatinine up to 3.54.   CT renal 3/10 showed invasive prostate cancer into the bladder with severe bilateral hydronephrosis.  A Foley catheter was inserted.  TRH was asked to admit.  Repeated imaging showed persistent severe bilateral hydronephrosis.  Urology and nephrology recommended bilateral nephrostomy tube placement by interventional radiology.  Patient has declined and has been reluctant to proceed.  Per oncology his cancer is not curable.  Per Nephrology, not a good candidate for hemodialysis.  Due to poor prognosis palliative care team was consulted and is following.  Hospital course complicated by intermittent high grade fevers, suspected hematuria and positive FOBT with Hg drop down to 7.1.  Transfused 1U PRBC on 3/14, Hg up to 8.8 post RBC transfusion. Dropped again and transfused 1U PRBC on 3/17.  Received IV lasix due to volume overload with acute hypoxia on 3/14-3/15.  Responded well with improvement of hypoxia. IV lasix dced on 3/15 due to rise in creatinine and soft BPs.  Restarted IV fluid on 3/16 by nephrology.  IV azithromycin and Rocephin were added on 06/30/2019 due to concern for possible pneumonia, Covered empirically for community-acquired pneumonia.  Repeated blood cultures x 2 have been negative to  date.  Plan for TEE on 06/21/2019 by cardiology.  Will be n.p.o. after midnight.  07/01/19: Seen and examined.  Alert and oriented x4.  States he feels weak.  Able to move all 4 extremities equally.    Assessment/Plan: Principal Problem:   Severe sepsis (HCC) Active Problems:   Urinary retention   AKI (acute kidney injury) ([place])   Cholelithiasis   Diabetes mellitus, new onset ([place])   DM2 (diabetes mellitus, type 2) (HCC)   CKD (chronic kidney disease) stage 3, GFR 30-59 ml/min   Prostate cancer (HCC)   GI bleed   Heme positive stool   Anemia due to chronic blood loss   Anemia   Enterococcal bacteremia   Palliative care by specialist   Goals of care, counseling/discussion   Metabolic encephalopathy   Pleural effusion  Sepsis, present on admission suspect from urinary source, Enterococcus bacteremia and possibly CAP, POA. Intermittent high-grade fevers with tachycardia Rising procalcitonn up to 16 from 8.  Productive cough. Currently on IV Azithromycin/Rocephin to cover CAP, day #2, until PNA is ruled out.   Also on IV ampicillin for Proteus bacteremia Blood cultures x2 grew Enterococcus faecalis, sensitive to ampicillin Repeated blood cultures negative to date, continue to follow cultures until finalized. Ucx multiple species present, suggesting recollection CT chest, abdomen and pelvis completed on 06/29/2019 showing persistent bilateral hydronephrosis and hydroureter to the level of the bladder.  No source of hemorrhage other than high density thickening within the bladder.  No inflammation no infection identified in the abdomen and pelvis. B/L pleural effusion and atelectasis, personally reviewed.  Enterococcus bacteremia 2/2 bottles Management as per above Procalcitonin is 8.4 Repeated  blood cultures on 06/26/2019 negative to date.  Continue to follow cultures TTE shows no evidence of endocarditis Colonoscopy planned once hemodynamically stable Infectious disease has been  consulted and following. Plan for TEE on 06/16/2019 by cardiology. N.p.o. after midnight  Acute hypoxic respiratory failure secondary to suspected pulmonary edema/bilateral pleural effusion Not on oxygen supplementation at baseline Continue to maintain O2 saturation greater than 92% Encourage use of incentive spirometer and flutter valve.  Suspected CAP Independently reviewed CXR showing increase in pulmonary vascularity with RLL opacity.  CAP could not be excluded Productive cough.  Continue IV abx empirically until PNA is ruled out.    Severe protein calorie malnutrition Albumin 1.7 BMI 32 with loss of muscle mass Continue to encourage increase in protein calorie intake Dietitian following, appreciate recommendations.  Refractory sinus tachycardia likely multifactorial 2/2 to sepsis versus fever versus other ST on 12 lead EKG Intermittent fevers Continue po lopressor 12.5 mg BID Continue IV lopressor 2.5 mg q3H prn with HR>130 when MAP>70  Resolved prolonged QTC 12 lead EKG on 3/12 QTC >540 Repeated 12 lead EKG done on 3/14 QTc 353  Left lower extremity pain, DVT ruled out. Chronic left lower extremity weakness with worsening pain Bilateral Doppler ultrasound negative for DVT.  Severe physical debility Continue PT OT with assistance and fall precaution as tolerated Encourage increase protein calorie intake and mobilize as tolerated  Acute kidney injury on chronic kidney disease stage IIIB likely postrenal versus prerenal 2/2 to obstructive uropathy from prostate cancer versus intravascular volume depletion:  Baseline creatinine appears to be 2.0 with GFR of 36 Presented with creatinine up to 3.8 Creatinine  3.52>> 3.3>> 3.1. Continue to avoid nephrotoxins and hypotension Nephrology following, appreciate assistance. Continue Foley catheter #7 Fluid started by nephrology LR at 75cc/hr on 3/16 Good UO  Hypokalemia Potassium 3.4 Repleted with IV KCl 10 mEq x 1 Judicious  replacement due to poor renal function  Hyperphosphatemia in the setting of acute renal failure Phosphorous 5.1 Deferred management to nephrology  Anemia of chronic disease, positive FOBT and mild hematuria:  Received 2 units of PRBC.  Hg 7.2>>7.6> 7.1 Transfused 1 unit PRBC on 06/27/2019 Transfused 1 U PRBC 06/30/19 Continue PPI Continue to monitor H&H. Hemoglobin stable at 7.4 from 7.4.  Resolved Hypernatremia:  Na+ 144 on LR>> 146 Free water oral intake instead of IV fluids for now  Type 2 diabetes with hyperglycemia:  A1C 6.6 on 06/24/19.  Hold off Metformin, home med, may have to dc with poor renal function.   Continue sliding scale insulin.  Metastatic prostate cancer on Zytiga, bladder obstruction, debility and chronic anemia: Followed by his oncologist.  Will have outpatient follow-up after stabilization. Zytiga on hold  Goals of care Palliative care following  DVT prophylaxis: DVT prophylaxis with SCDs Code Status: Full code  Family Communication:  Updated the patient's mother via phone.  Disposition Plan: patient is from home. Anticipated DC to home, Barriers to discharge active treatment, IV antibiotics, inpatient procedures planned with cardiology.   Consultants:   Urology  GI  Oncology  Palliative medicine  Nephrology  Cardiology  Dietitian  Procedures:   Foley cath insertion     Objective: Vitals:   07/01/19 0400 07/01/19 0500 07/01/19 0600 07/01/19 0800  BP: 136/69 129/75 (!) 143/72   Pulse: (!) 114 (!) 111 (!) 122   Resp: (!) 26 (!) 25 (!) 24   Temp:    100.1 F (37.8 C)  TempSrc:    Oral  SpO2: 96% 94%  94%   Weight:      Height:        Intake/Output Summary (Last 24 hours) at 07/01/2019 1051 Last data filed at 07/01/2019 0600 Gross per 24 hour  Intake 2740.05 ml  Output 3550 ml  Net -809.95 ml   Filed Weights   06/28/19 0515 06/29/19 0500 06/30/19 0500  Weight: 99.8 kg 99.9 kg 102.9 kg    Exam:  . General: 59  y.o. year-old male well-developed and nourished no acute distress.  Alert and oriented x3.   . Cardiovascular: Tachycardia no rubs or gallops. [name] Respiratory: Rales bilaterally.  No wheezing noted.   . Abdomen: Obese normal bowel sounds present.   . Musculoskeletal: No lower extremity edema bilaterally.   [name] Psychiatry: Mood is appropriate for condition and setting.  Data Reviewed: CBC: Recent Labs  Lab 06/27/19 0317 06/27/19 1930 06/28/19 0316 06/28/19 0316 06/29/19 0212 06/29/19 1054 06/30/19 0156 06/30/19 0839 07/01/19 0209  WBC 9.0  --  9.3  --  10.0  --  8.5  --  7.0  NEUTROABS 6.8  --  6.6  --  7.4  --  6.4  --  5.0  HGB 7.6*   < > 8.8*   < > 7.7* 7.5* 6.9* 7.4* 7.4*  HCT 27.7*   < > 31.1*   < > 27.8* 26.0* 24.6* 26.2* 26.3*  MCV 90.8  --  91.2  --  93.3  --  92.8  --  92.9  PLT 225  --  229  --  206  --  159  --  156   < > = values in this interval not displayed.   Basic Metabolic Panel: Recent Labs  Lab 07/06/2019 0216 07/07/2019 0216 06/26/19 0210 06/26/19 2440 06/27/19 1716 06/28/19 0316 06/29/19 0212 06/29/19 1054 06/30/19 0156 07/01/19 0209  NA 154*   < > 145   < > 146* 145 148*  --  144 146*  K 4.2   < > 3.2*   < > 3.7 3.6 3.5  --  3.4* 3.4*  CL 119*   < > 113*   < > 112* 110 112*  --  112* 111  CO2 22   < > 21*   < > 23 22 24   --  23 24  GLUCOSE 113*   < > 151*   < > 136* 124* 129*  --  214* 214*  BUN 57*   < > 44*   < > 37* 39* 44*  --  52* 53*  CREATININE 3.82*   < > 3.32*   < > 3.14* 3.36* 3.52*  --  3.33* 3.10*  CALCIUM 8.6*   < > 9.0   < > 9.2 9.1 9.1  --  9.4 9.3  MG 2.1  --  1.9  --   --  2.0  --   --   --   --   PHOS 5.7*  --   --   --   --   --   --  5.1*  --   --    < > = values in this interval not displayed.   GFR: Estimated Creatinine Clearance: 31.5 mL/min (A) (by C-G formula based on SCr of 3.1 mg/dL (H)). Liver Function Tests: Recent Labs  Lab 06/15/2019 0216 06/26/19 0210 06/27/19 0317 07/01/19 0209  AST 29 42* 40 35  ALT 22 26 26  21   ALKPHOS 158* 144* 158* 145*  BILITOT 0.5 0.5 0.8 0.3  PROT 6.1* 6.0*  5.9* 5.5*  ALBUMIN 2.0* 1.6* 1.7* 1.5*   No results for input(s): LIPASE, AMYLASE in the last 168 hours. No results for input(s): AMMONIA in the last 168 hours. Coagulation Profile: No results for input(s): INR, PROTIME in the last 168 hours. Cardiac Enzymes: No results for input(s): CKTOTAL, CKMB, CKMBINDEX, TROPONINI in the last 168 hours. BNP (last 3 results) No results for input(s): PROBNP in the last 8760 hours. HbA1C: No results for input(s): HGBA1C in the last 72 hours. CBG: Recent Labs  Lab 06/30/19 0514 06/30/19 1142 06/30/19 1734 07/01/19 0227 07/01/19 0638  GLUCAP 181* 202* 180* 220* 167*   Lipid Profile: No results for input(s): CHOL, HDL, LDLCALC, TRIG, CHOLHDL, LDLDIRECT in the last 72 hours. Thyroid Function Tests: No results for input(s): TSH, T4TOTAL, FREET4, T3FREE, THYROIDAB in the last 72 hours. Anemia Panel: No results for input(s): VITAMINB12, FOLATE, FERRITIN, TIBC, IRON, RETICCTPCT in the last 72 hours. Urine analysis:    Component Value Date/Time   COLORURINE YELLOW 06/22/2019 1537   APPEARANCEUR CLEAR 07/11/2019 1537   LABSPEC 1.006 06/15/2019 1537   PHURINE 6.0 07/02/2019 1537   GLUCOSEU NEGATIVE 06/29/2019 1537   HGBUR MODERATE (A) 06/22/2019 1537   BILIRUBINUR NEGATIVE 06/16/2019 1537   KETONESUR NEGATIVE 07/12/2019 1537   PROTEINUR 30 (A) 06/15/2019 1537   NITRITE NEGATIVE 06/19/2019 1537   LEUKOCYTESUR LARGE (A) 06/24/2019 1537   Sepsis Labs: @LABRCNTIP (procalcitonin:4,lacticidven:4)  ) Recent Results (from the past 240 hour(s))  Urine culture     Status: Abnormal   Collection Time: 07/07/2019  3:37 PM   Specimen: Urine, Random  Result Value Ref Range Status   Specimen Description   Final    URINE, RANDOM Performed at [facility], [place] [address], [place], [place] [address]    Special Requests   Final    NONE Performed at [facility], [place] [address] [place], [place]  [address]    Culture MULTIPLE SPECIES PRESENT, SUGGEST RECOLLECTION (A)  Final   Report Status 06/24/2019 FINAL  Final  Culture, blood (routine x 2)     Status: Abnormal   Collection Time: 07/10/2019  4:56 PM   Specimen: BLOOD  Result Value Ref Range Status   Specimen Description   Final    BLOOD RIGHT ANTECUBITAL Performed at [facility] [address], [place], [place] [address]    Special Requests   Final    BOTTLES DRAWN AEROBIC ONLY Blood Culture results may not be optimal due to an excessive volume of blood received in culture bottles Performed at [facility] [address], [place], [place] [address]    Culture  Setup Time   Final    AEROBIC BOTTLE ONLY GRAM POSITIVE COCCI IN CHAINS CRITICAL RESULT CALLED TO, READ BACK BY AND VERIFIED WITH: Lavell Luster [facility] 06/25/19 0032 JDW Performed at [facility], [place] [address] [place], [place] [address]    Culture ENTEROCOCCUS FAECALIS (A)  Final   Report Status 06/26/2019 FINAL  Final   Organism ID, Bacteria ENTEROCOCCUS FAECALIS  Final      Susceptibility   Enterococcus faecalis - MIC*    AMPICILLIN <=2 SENSITIVE Sensitive     VANCOMYCIN 1 SENSITIVE Sensitive     GENTAMICIN SYNERGY SENSITIVE Sensitive     * ENTEROCOCCUS FAECALIS  Blood Culture ID Panel (Reflexed)     Status: Abnormal   Collection Time: 06/29/2019  4:56 PM  Result Value Ref Range Status   Enterococcus species DETECTED (  A) NOT DETECTED Final    Comment: CRITICAL RESULT CALLED TO, READ BACK BY AND VERIFIED WITH: J. GRIMSLEY PHARMD 07/14/2019 0032 JDW    Vancomycin resistance NOT DETECTED NOT DETECTED Final   Listeria monocytogenes NOT DETECTED NOT DETECTED Final   Staphylococcus species NOT DETECTED NOT DETECTED Final   Staphylococcus aureus (BCID) NOT DETECTED NOT DETECTED Final   Streptococcus species NOT DETECTED NOT DETECTED Final   Streptococcus agalactiae NOT  DETECTED NOT DETECTED Final   Streptococcus pneumoniae NOT DETECTED NOT DETECTED Final   Streptococcus pyogenes NOT DETECTED NOT DETECTED Final   Acinetobacter baumannii NOT DETECTED NOT DETECTED Final   Enterobacteriaceae species NOT DETECTED NOT DETECTED Final   Enterobacter cloacae complex NOT DETECTED NOT DETECTED Final   Escherichia coli NOT DETECTED NOT DETECTED Final   Klebsiella oxytoca NOT DETECTED NOT DETECTED Final   Klebsiella pneumoniae NOT DETECTED NOT DETECTED Final   Proteus species NOT DETECTED NOT DETECTED Final   Serratia marcescens NOT DETECTED NOT DETECTED Final   Haemophilus influenzae NOT DETECTED NOT DETECTED Final   Neisseria meningitidis NOT DETECTED NOT DETECTED Final   Pseudomonas aeruginosa NOT DETECTED NOT DETECTED Final   Candida albicans NOT DETECTED NOT DETECTED Final   Candida glabrata NOT DETECTED NOT DETECTED Final   Candida krusei NOT DETECTED NOT DETECTED Final   Candida parapsilosis NOT DETECTED NOT DETECTED Final   Candida tropicalis NOT DETECTED NOT DETECTED Final    Comment: Performed at [facility], [place]. [address], [place], [place] [address]  Culture, blood (routine x 2)     Status: Abnormal   Collection Time: 07/06/2019  5:16 PM   Specimen: BLOOD  Result Value Ref Range Status   Specimen Description   Final    BLOOD RIGHT ANTECUBITAL Performed at [facility] [address], [place], [place] [address]    Special Requests   Final    BOTTLES DRAWN AEROBIC AND ANAEROBIC Blood Culture adequate volume Performed at [facility] [address] [place], [place] [address]    Culture  Setup Time   Final    GRAM POSITIVE COCCI IN CHAINS IN BOTH AEROBIC AND ANAEROBIC BOTTLES CRITICAL RESULT CALLED TO, READ BACK BY AND VERIFIED WITH: PHARMD M RENZ [id number] MLM    Culture (A)  Final    ENTEROCOCCUS FAECALIS SUSCEPTIBILITIES PERFORMED ON PREVIOUS CULTURE WITHIN THE LAST 5 DAYS. Performed at [facility], [place] [address], [place], [place] [address]    Report Status 06/26/2019 FINAL  Final  SARS CORONAVIRUS 2 (TAT 6-24 HRS) Nasopharyngeal Nasopharyngeal Swab     Status: None   Collection Time: 06/20/2019  5:20 PM   Specimen: Nasopharyngeal Swab  Result Value Ref Range Status   SARS Coronavirus 2 NEGATIVE NEGATIVE Final    Comment: (NOTE) SARS-CoV-2 target nucleic acids are NOT DETECTED. The SARS-CoV-2 RNA is generally detectable in upper and lower respiratory specimens during the acute phase of infection. Negative results do not preclude SARS-CoV-2 infection, do not rule out co-infections with other pathogens, and should not be used as the sole basis for treatment or other patient management decisions. Negative results must be combined with clinical observations, patient history, and epidemiological information. The expected result is Negative. Fact Sheet for Patients: SugarRoll.be Fact Sheet for Healthcare Providers: https://www.woods-mathews.com/ This test is not yet approved or cleared by the [place] FDA and  has been authorized for detection and/or diagnosis of SARS-CoV-2 by FDA under an Emergency Use Authorization (EUA).  This EUA will remain  in effect (meaning this test can be used) for the duration of the COVID-19 declaration under Section 56 4(b)(1) of the Act, 21 U.S.C. section 360bbb-3(b)(1), unless the authorization is terminated or revoked sooner. Performed at [facility], [place] [address], [place], [place] [address]   MRSA PCR Screening     Status: None   Collection Time: 06/24/19  1:08 AM   Specimen: Nasal Mucosa; Nasopharyngeal  Result Value Ref Range Status   MRSA by PCR NEGATIVE NEGATIVE Final    Comment:        The GeneXpert MRSA Assay (FDA approved for NASAL specimens only), is one component of a comprehensive MRSA colonization surveillance program. It is not intended to diagnose MRSA infection  nor to guide or monitor treatment for MRSA infections. Performed at [facility], [place] [address] [place], [place] [address]   Culture, blood (routine x 2)     Status: None   Collection Time: 06/26/19  2:10 AM   Specimen: BLOOD  Result Value Ref Range Status   Specimen Description   Final    BLOOD RIGHT ARM Performed at [place] [address], [place], [place] [address]    Special Requests   Final    BOTTLES DRAWN AEROBIC ONLY Blood Culture adequate volume Performed at [facility] [address] [place], [place] [address]    Culture   Final    NO GROWTH 5 DAYS Performed at [facility], [place] [address], [place], [place] [address]    Report Status 07/01/2019 FINAL  Final  Culture, blood (routine x 2)     Status: None   Collection Time: 06/26/19  2:10 AM   Specimen: BLOOD  Result Value Ref Range Status   Specimen Description   Final    BLOOD RIGHT HAND Performed at [facility] [address], [place], [place] [address]    Special Requests   Final    BOTTLES DRAWN AEROBIC ONLY Blood Culture adequate volume Performed at [place] [address] [place], [place] [address]    Culture   Final    NO GROWTH 5 DAYS Performed at [facility], [place] [address] [place], [place] [address]    Report Status 07/01/2019 FINAL  Final      Studies: No results found.  Scheduled Meds: . Chlorhexidine Gluconate Cloth  6 each Topical Daily  . feeding supplement (ENSURE ENLIVE)  237 mL Oral TID BM  . feeding supplement (PRO-STAT SUGAR FREE 64)  30 mL Oral BID  . gabapentin  100 mg Oral BID  . insulin aspart  0-9 Units Subcutaneous Q6H  . mouth rinse  15 mL Mouth Rinse BID  . metoprolol tartrate  12.5 mg Oral BID  . multivitamin with minerals  1 tablet Oral Daily  . pantoprazole  40 mg Oral Q0600  . protein supplement  1 [place] Oral TID WC    Continuous Infusions: .  ampicillin (OMNIPEN) IV Stopped (07/01/19 0552)  . azithromycin Stopped (07/01/19 1025)  . cefTRIAXone (ROCEPHIN)  IV Stopped (07/01/19 1025)  . lactated ringers 75 mL/hr at 07/01/19 0600     LOS: 8 days     Kayleen Memos, MD [facility] Pager [phone]  If 7PM-7AM, please contact night-coverage [email or web address] Password [facility] 07/01/2019, 10:51 AM

## 2019-07-02 ENCOUNTER — Encounter (HOSPITAL_COMMUNITY): Payer: Self-pay | Admitting: Internal Medicine

## 2019-07-02 ENCOUNTER — Encounter (HOSPITAL_COMMUNITY): Admission: EM | Disposition: E | Payer: Self-pay | Source: Home / Self Care | Attending: Internal Medicine

## 2019-07-02 ENCOUNTER — Inpatient Hospital Stay (HOSPITAL_COMMUNITY): Payer: Medicaid Other | Admitting: Certified Registered Nurse Anesthetist

## 2019-07-02 ENCOUNTER — Inpatient Hospital Stay (HOSPITAL_COMMUNITY): Payer: Medicaid Other

## 2019-07-02 DIAGNOSIS — R7881 Bacteremia: Secondary | ICD-10-CM

## 2019-07-02 HISTORY — PX: TEE WITHOUT CARDIOVERSION: SHX5443

## 2019-07-02 LAB — CBC WITH DIFFERENTIAL/PLATELET
Abs Immature Granulocytes: 0.4 10*3/uL — ABNORMAL HIGH (ref 0.00–0.07)
Basophils Absolute: 0 10*3/uL (ref 0.0–0.1)
Basophils Relative: 0 %
Eosinophils Absolute: 0.1 10*3/uL (ref 0.0–0.5)
Eosinophils Relative: 2 %
HCT: 27.3 % — ABNORMAL LOW (ref 39.0–52.0)
Hemoglobin: 7.9 g/dL — ABNORMAL LOW (ref 13.0–17.0)
Immature Granulocytes: 5 %
Lymphocytes Relative: 21 %
Lymphs Abs: 1.7 10*3/uL (ref 0.7–4.0)
MCH: 26.8 pg (ref 26.0–34.0)
MCHC: 28.9 g/dL — ABNORMAL LOW (ref 30.0–36.0)
MCV: 92.5 fL (ref 80.0–100.0)
Monocytes Absolute: 0.1 10*3/uL (ref 0.1–1.0)
Monocytes Relative: 1 %
Neutro Abs: 5.5 10*3/uL (ref 1.7–7.7)
Neutrophils Relative %: 71 %
Platelets: 196 10*3/uL (ref 150–400)
RBC: 2.95 MIL/uL — ABNORMAL LOW (ref 4.22–5.81)
RDW: 18.2 % — ABNORMAL HIGH (ref 11.5–15.5)
WBC: 7.8 10*3/uL (ref 4.0–10.5)
nRBC: 0 % (ref 0.0–0.2)

## 2019-07-02 LAB — GLUCOSE, CAPILLARY
Glucose-Capillary: 178 mg/dL — ABNORMAL HIGH (ref 70–99)
Glucose-Capillary: 153 mg/dL — ABNORMAL HIGH (ref 70–99)
Glucose-Capillary: 164 mg/dL — ABNORMAL HIGH (ref 70–99)
Glucose-Capillary: 180 mg/dL — ABNORMAL HIGH (ref 70–99)

## 2019-07-02 LAB — BASIC METABOLIC PANEL
Anion gap: 8 (ref 5–15)
BUN: 51 mg/dL — ABNORMAL HIGH (ref 6–20)
CO2: 24 mmol/L (ref 22–32)
Calcium: 9.4 mg/dL (ref 8.9–10.3)
Chloride: 118 mmol/L — ABNORMAL HIGH (ref 98–111)
Creatinine, Ser: 3.27 mg/dL — ABNORMAL HIGH (ref 0.61–1.24)
GFR calc Af Amer: 23 mL/min — ABNORMAL LOW (ref >60)
GFR calc non Af Amer: 20 mL/min — ABNORMAL LOW (ref >60)
Glucose, Bld: 186 mg/dL — ABNORMAL HIGH (ref 70–99)
Potassium: 3.8 mmol/L (ref 3.5–5.1)
Sodium: 150 mmol/L — ABNORMAL HIGH (ref 135–145)

## 2019-07-02 SURGERY — ECHOCARDIOGRAM, TRANSESOPHAGEAL
Anesthesia: Monitor Anesthesia Care

## 2019-07-02 MED ORDER — PROPOFOL 500 MG/50ML IV EMUL
INTRAVENOUS | Status: DC | PRN
  Administered 2019-07-02: 80 ug/kg/min via INTRAVENOUS

## 2019-07-02 MED ORDER — PHENYLEPHRINE 40 MCG/ML (10ML) SYRINGE FOR IV PUSH (FOR BLOOD PRESSURE SUPPORT)
PREFILLED_SYRINGE | INTRAVENOUS | Status: DC | PRN
  Administered 2019-07-02: 40 ug via INTRAVENOUS
  Administered 2019-07-02 (×3): 120 ug via INTRAVENOUS

## 2019-07-02 MED ORDER — SODIUM CHLORIDE 0.9 % IV SOLN: INTRAVENOUS | Status: DC

## 2019-07-02 MED ORDER — PROPOFOL 10 MG/ML IV BOLUS
INTRAVENOUS | Status: DC | PRN
  Administered 2019-07-02 (×2): 10 mg via INTRAVENOUS

## 2019-07-02 MED ORDER — AMLODIPINE BESYLATE 5 MG PO TABS
2.5000 mg | ORAL_TABLET | Freq: Once | ORAL | Status: AC
  Administered 2019-07-02: 18:00:00 2.5 mg via ORAL
  Filled 2019-07-02: qty 1

## 2019-07-02 MED ORDER — LACTATED RINGERS IV SOLN: INTRAVENOUS | Status: DC | PRN

## 2019-07-02 MED ORDER — AMLODIPINE BESYLATE 5 MG PO TABS: 5.0000 mg | ORAL_TABLET | Freq: Every day | ORAL | Status: DC

## 2019-07-02 MED ORDER — AMLODIPINE BESYLATE 5 MG PO TABS
2.5000 mg | ORAL_TABLET | Freq: Every day | ORAL | Status: DC
  Administered 2019-07-02: 16:00:00 2.5 mg via ORAL
  Filled 2019-07-02: qty 1

## 2019-07-02 NOTE — Progress Notes (Signed)
INFECTIOUS DISEASE PROGRESS NOTE  ID: Jeffery [name] is a 59 y.o. male with  Principal Problem:   Severe sepsis ([place]) Active Problems:   Urinary retention   AKI (acute kidney injury) ([place])   Cholelithiasis   Diabetes mellitus, new onset ([place])   DM2 (diabetes mellitus, type 2) ([facility])   CKD (chronic kidney disease) stage 3, GFR 30-59 ml/min   Prostate cancer ([facility])   GI bleed   Heme positive stool   Anemia due to chronic blood loss   Anemia   Enterococcal bacteremia   Palliative care by specialist   Goals of care, counseling/discussion   Metabolic encephalopathy   Pleural effusion  Subjective: C/o pain but does not localize.  Denies diarrhea.   Abtx:  Anti-infectives (From admission, onward)   Start     Dose/Rate Route Frequency Ordered Stop   06/30/19 0615  cefTRIAXone (ROCEPHIN) 2 g in sodium chloride 0.9 % 100 mL IVPB     2 g 200 mL/hr over 30 Minutes Intravenous Daily 06/30/19 0541     06/30/19 0615  azithromycin (ZITHROMAX) 500 mg in sodium chloride 0.9 % 250 mL IVPB     500 mg 250 mL/hr over 60 Minutes Intravenous Daily 06/30/19 0542     06/27/19 1800  cefTRIAXone (ROCEPHIN) 2 g in sodium chloride 0.9 % 100 mL IVPB  Status:  Discontinued     2 g 200 mL/hr over 30 Minutes Intravenous Every 24 hours 06/27/19 1635 06/28/19 1426   06/30/2019 1400  ampicillin (OMNIPEN) 2 g in sodium chloride 0.9 % 100 mL IVPB     2 g 300 mL/hr over 20 Minutes Intravenous Every 8 hours 06/20/2019 0612     07/11/2019 0115  ampicillin (OMNIPEN) 2 g in sodium chloride 0.9 % 100 mL IVPB  Status:  Discontinued     2 g 300 mL/hr over 20 Minutes Intravenous Every 6 hours 06/16/2019 0107 06/15/2019 0611   06/24/19 0800  cefTRIAXone (ROCEPHIN) 2 g in sodium chloride 0.9 % 100 mL IVPB  Status:  Discontinued     2 g 200 mL/hr over 30 Minutes Intravenous Every 24 hours 06/24/19 0722 06/23/2019 0104   06/15/2019 1700  cefTRIAXone (ROCEPHIN) 1 g in sodium chloride 0.9 % 100 mL IVPB     1 g 200 mL/hr over  30 Minutes Intravenous  Once 06/27/2019 1652 07/05/2019 1949      Medications:  Scheduled: . Chlorhexidine Gluconate Cloth  6 each Topical Daily  . feeding supplement (ENSURE ENLIVE)  237 mL Oral TID BM  . feeding supplement (PRO-STAT SUGAR FREE 64)  30 mL Oral BID  . gabapentin  100 mg Oral BID  . insulin aspart  0-9 Units Subcutaneous Q6H  . mouth rinse  15 mL Mouth Rinse BID  . metoprolol tartrate  12.5 mg Oral BID  . multivitamin with minerals  1 tablet Oral Daily  . pantoprazole  40 mg Oral Q0600  . protein supplement  1 Scoop Oral TID WC    Objective: Vital signs in last 24 hours: Temp:  [98.6 F (37 C)-102.7 F (39.3 C)] 99.1 F (37.3 C) (03/19 0810) Pulse Rate:  [119-142] 125 (03/19 0700) Resp:  [23-35] 23 (03/19 0700) BP: (108-160)/(53-117) 139/75 (03/19 0700) SpO2:  [91 %-96 %] 94 % (03/19 0700) Weight:  [102.5 kg] 102.5 kg (03/19 0500)   General appearance: alert, fatigued, no distress and morbidly obese Resp: clear to auscultation bilaterally Cardio: regularly irregular rhythm GI: normal findings: bowel sounds normal and soft,  non-tender and abnormal findings:  distended Extremities: edema none  Lab Results Recent Labs    07/01/19 0209 06/22/2019 0214  WBC 7.0 7.8  HGB 7.4* 7.9*  HCT 26.3* 27.3*  NA 146* 150*  K 3.4* 3.8  CL 111 118*  CO2 24 24  BUN 53* 51*  CREATININE 3.10* 3.27*   Liver Panel Recent Labs    07/01/19 0209  PROT 5.5*  ALBUMIN 1.5*  AST 35  ALT 21  ALKPHOS 145*  BILITOT 0.3  BILIDIR <0.1  IBILI NOT CALCULATED   Sedimentation Rate No results for input(s): ESRSEDRATE in the last 72 hours. C-Reactive Protein No results for input(s): CRP in the last 72 hours.  Microbiology: Recent Results (from the past 240 hour(s))  Urine culture     Status: Abnormal   Collection Time: 07/07/2019  3:37 PM   Specimen: Urine, Random  Result Value Ref Range Status   Specimen Description   Final    URINE, RANDOM Performed at [facility] [address], [place], [place] [address]    Special Requests   Final    NONE Performed at [facility], [place] [address], [place], [place] [address]    Culture MULTIPLE SPECIES PRESENT, SUGGEST RECOLLECTION (A)  Final   Report Status 06/24/2019 FINAL  Final  Culture, blood (routine x 2)     Status: Abnormal   Collection Time: 07/06/2019  4:56 PM   Specimen: BLOOD  Result Value Ref Range Status   Specimen Description   Final    BLOOD RIGHT ANTECUBITAL Performed at [facility] [address], [place], [place] [address]    Special Requests   Final    BOTTLES DRAWN AEROBIC ONLY Blood Culture results may not be optimal due to an excessive volume of blood received in culture bottles Performed at [facility] [address], [place], [place] [address]    Culture  Setup Time   Final    AEROBIC BOTTLE ONLY GRAM POSITIVE COCCI IN CHAINS CRITICAL RESULT CALLED TO, READ BACK BY AND VERIFIED WITH: Lavell Luster [facility] 07/05/2019 0032 JDW Performed at [facility], [place] [address], [place], [place] [address]    Culture ENTEROCOCCUS FAECALIS (A)  Final   Report Status 06/26/2019 FINAL  Final   Organism ID, Bacteria ENTEROCOCCUS FAECALIS  Final      Susceptibility   Enterococcus faecalis - MIC*    AMPICILLIN <=2 SENSITIVE Sensitive     VANCOMYCIN 1 SENSITIVE Sensitive     GENTAMICIN SYNERGY SENSITIVE Sensitive     * ENTEROCOCCUS FAECALIS  Blood Culture ID Panel (Reflexed)     Status: Abnormal   Collection Time: 06/16/2019  4:56 PM  Result Value Ref Range Status   Enterococcus species DETECTED (A) NOT DETECTED Final    Comment: CRITICAL RESULT CALLED TO, READ BACK BY AND VERIFIED WITH: J. GRIMSLEY PHARMD 06/21/2019 0032 JDW    Vancomycin resistance NOT DETECTED NOT DETECTED Final   Listeria monocytogenes NOT DETECTED NOT DETECTED Final   Staphylococcus species NOT DETECTED NOT DETECTED Final   Staphylococcus aureus  (BCID) NOT DETECTED NOT DETECTED Final   Streptococcus species NOT DETECTED NOT DETECTED Final   Streptococcus agalactiae NOT DETECTED NOT DETECTED Final   Streptococcus pneumoniae NOT DETECTED NOT DETECTED Final   Streptococcus pyogenes NOT DETECTED NOT DETECTED Final   Acinetobacter baumannii NOT DETECTED NOT DETECTED Final   Enterobacteriaceae species NOT DETECTED NOT DETECTED Final   Enterobacter cloacae complex  NOT DETECTED NOT DETECTED Final   Escherichia coli NOT DETECTED NOT DETECTED Final   Klebsiella oxytoca NOT DETECTED NOT DETECTED Final   Klebsiella pneumoniae NOT DETECTED NOT DETECTED Final   Proteus species NOT DETECTED NOT DETECTED Final   Serratia marcescens NOT DETECTED NOT DETECTED Final   Haemophilus influenzae NOT DETECTED NOT DETECTED Final   Neisseria meningitidis NOT DETECTED NOT DETECTED Final   Pseudomonas aeruginosa NOT DETECTED NOT DETECTED Final   Candida albicans NOT DETECTED NOT DETECTED Final   Candida glabrata NOT DETECTED NOT DETECTED Final   Candida krusei NOT DETECTED NOT DETECTED Final   Candida parapsilosis NOT DETECTED NOT DETECTED Final   Candida tropicalis NOT DETECTED NOT DETECTED Final    Comment: Performed at [facility], [place] [address] [place], [place] [address]  Culture, blood (routine x 2)     Status: Abnormal   Collection Time: 07/11/2019  5:16 PM   Specimen: BLOOD  Result Value Ref Range Status   Specimen Description   Final    BLOOD RIGHT ANTECUBITAL Performed at [facility] [address], [place], [place] [address]    Special Requests   Final    BOTTLES DRAWN AEROBIC AND ANAEROBIC Blood Culture adequate volume Performed at [facility] [address], [place], [place] [address]    Culture  Setup Time   Final    GRAM POSITIVE COCCI IN CHAINS IN BOTH AEROBIC AND ANAEROBIC BOTTLES CRITICAL RESULT CALLED TO, READ BACK BY AND VERIFIED WITH: PHARMD M RENZ [id number] [phone]    Culture (A)   Final    ENTEROCOCCUS FAECALIS SUSCEPTIBILITIES PERFORMED ON PREVIOUS CULTURE WITHIN THE LAST 5 DAYS. Performed at [facility], [place] [address], [place], [place] [address]    Report Status 06/26/2019 FINAL  Final  SARS CORONAVIRUS 2 (TAT 6-24 HRS) Nasopharyngeal Nasopharyngeal Swab     Status: None   Collection Time: 06/19/2019  5:20 PM   Specimen: Nasopharyngeal Swab  Result Value Ref Range Status   SARS Coronavirus 2 NEGATIVE NEGATIVE Final    Comment: (NOTE) SARS-CoV-2 target nucleic acids are NOT DETECTED. The SARS-CoV-2 RNA is generally detectable in upper and lower respiratory specimens during the acute phase of infection. Negative results do not preclude SARS-CoV-2 infection, do not rule out co-infections with other pathogens, and should not be used as the sole basis for treatment or other patient management decisions. Negative results must be combined with clinical observations, patient history, and epidemiological information. The expected result is Negative. Fact Sheet for Patients: SugarRoll.be Fact Sheet for Healthcare Providers: https://www.woods-mathews.com/ This test is not yet approved or cleared by the [place] FDA and  has been authorized for detection and/or diagnosis of SARS-CoV-2 by FDA under an Emergency Use Authorization (EUA). This EUA will remain  in effect (meaning this test can be used) for the duration of the COVID-19 declaration under Section 56 4(b)(1) of the Act, 21 U.S.C. section 360bbb-3(b)(1), unless the authorization is terminated or revoked sooner. Performed at [facility], [place] [address] [place], [place] [address]   MRSA PCR Screening     Status: None   Collection Time: 06/24/19  1:08 AM   Specimen: Nasal Mucosa; Nasopharyngeal  Result Value Ref Range Status   MRSA by PCR NEGATIVE NEGATIVE Final    Comment:        The GeneXpert MRSA Assay (FDA approved for NASAL specimens only), is  one component of a comprehensive MRSA colonization surveillance  program. It is not intended to diagnose MRSA infection nor to guide or monitor treatment for MRSA infections. Performed at [facility], [place] [address] [place], [place] [address]   Culture, blood (routine x 2)     Status: None   Collection Time: 06/26/19  2:10 AM   Specimen: BLOOD  Result Value Ref Range Status   Specimen Description   Final    BLOOD RIGHT ARM Performed at [facility] [address], [place], [place] [address]    Special Requests   Final    BOTTLES DRAWN AEROBIC ONLY Blood Culture adequate volume Performed at [facility] [address] [place], [place] [address]    Culture   Final    NO GROWTH 5 DAYS Performed at [facility], [place] [address], [place], [place] [address]    Report Status 07/01/2019 FINAL  Final  Culture, blood (routine x 2)     Status: None   Collection Time: 06/26/19  2:10 AM   Specimen: BLOOD  Result Value Ref Range Status   Specimen Description   Final    BLOOD RIGHT HAND Performed at [facility] [address] [place], [place] [address]    Special Requests   Final    BOTTLES DRAWN AEROBIC ONLY Blood Culture adequate volume Performed at [facility] [address] [place], [place] [address]    Culture   Final    NO GROWTH 5 DAYS Performed at [facility], [place] [address] [place], [place] [address]    Report Status 07/01/2019 FINAL  Final    Studies/Results: No results found.   Assessment/Plan: Enterococcal bacteremia (amp-s)  Repeat BCx 3-13 (-).  UTI, sepsis Pleural effusions DM2 Metastatic prostate Ca CKD3  Total days of antibiotics: 9  8 amp 1 ceftriaxone/azithro  Continues to have PM fever but otherwise look to be improved.  TEE (-). CT abd/pelvis does not show source of fever.  Prostate cancer would be unusual source of  fevers? Would consider thoracentesis, lower extrem dopplers if fever persists. Drug fever from ampicillin? Dr Linus Salmons available if questions over w/e.          Bobby Rumpf MD, FACP Infectious Diseases (pager) [phone] [email or web address] 06/17/2019, 9:21 AM  LOS: 9 days

## 2019-07-02 NOTE — Progress Notes (Signed)
PROGRESS NOTE  Jeffery [name]:[id number] DOB: 1960-05-26 DOA: 06/27/2019 PCP: [name], MD  HPI/Recap of past 24 hours: Patient is a 59 year old gentleman with PMH of stage IV prostate cancer with mets to bones, type II diabetes, polyneuropathy, hyperlipidemia, chronic kidney disease stage IIIB, anemia of chronic disease, chronic guaiac positive stools who presented to the emergency room with generalized weakness, hypoxia and tachycardia, painful urination.   He was working with therapies at home, he was found shaky and tremulous, oncology office was called who directed him to the ER.  Febrile tachycardic in the ED.  Hemoglobin 6.4.  Transfused 2 unit PRBCs.  Creatinine up to 3.54.   CT renal 3/10 showed invasive prostate cancer into the bladder with severe bilateral hydronephrosis.  A Foley catheter was inserted.  TRH was asked to admit.  Repeated imaging showed persistent severe bilateral hydronephrosis.  Urology and nephrology recommended bilateral nephrostomy tube placement by interventional radiology.  Patient has declined and has been reluctant to proceed.  Per oncology his cancer is not curable.  Per Nephrology, not a good candidate for hemodialysis.  Due to poor prognosis palliative care team was consulted and is following.  Hospital course complicated by intermittent high grade fevers, suspected hematuria and positive FOBT with Hg drop down to 7.1.  Transfused 1U PRBC on 3/14, Hg up to 8.8 post RBC transfusion. Dropped again and transfused 1U PRBC on 3/17.  Received IV lasix due to volume overload with acute hypoxia on 3/14-3/15.  Responded well with improvement of hypoxia. IV lasix dced on 3/15 due to rise in creatinine and soft BPs.  Restarted IV fluid on 3/16 by nephrology.  IV azithromycin and Rocephin were added on 06/30/2019 due to concern for possible pneumonia, Covered empirically for community-acquired pneumonia.  Repeated blood cultures x 2 have been negative to  date.  Plan for TEE on 07/07/2019 by cardiology.  Will be n.p.o. after midnight.  07/10/2019: Seen and examined at bedside this morning.  He has no new complaints.  Poor oral intake, mainly taking in fluid.  Sodium level rising up to 150, off IV fluid.  Cr level also rising.  Has not made decision regarding nephrostomy tubes placement.  Asked about the possibility of NG or J tube placement due to poor oral intake and poor nutritional status.  He did not give an answer.  Planned for TEE today at [facility].    Assessment/Plan: Principal Problem:   Severe sepsis (HCC) Active Problems:   Urinary retention   AKI (acute kidney injury) ([place])   Cholelithiasis   Diabetes mellitus, new onset ([place])   DM2 (diabetes mellitus, type 2) (HCC)   CKD (chronic kidney disease) stage 3, GFR 30-59 ml/min   Prostate cancer (HCC)   GI bleed   Heme positive stool   Anemia due to chronic blood loss   Anemia   Enterococcal bacteremia   Palliative care by specialist   Goals of care, counseling/discussion   Metabolic encephalopathy   Pleural effusion  Sepsis, present on admission suspect from urinary source, Enterococcus bacteremia and possibly CAP, POA. Afebrile this morning, leukocytosis and neutrophilia have resolved, tachycardia persists. Currently on IV Azithromycin/Rocephin to cover CAP, day #3 On IV ampicillin for Proteus bacteremia Blood cultures x2 grew Enterococcus faecalis, sensitive to ampicillin Repeated blood cultures negative final. Ucx multiple species present, suggesting recollection CT chest, abdomen and pelvis completed on 06/29/2019 showing persistent bilateral hydronephrosis and hydroureter to the level of the bladder.  No source of hemorrhage other  than high density thickening within the bladder.  No inflammation no infection identified in the abdomen and pelvis. B/L pleural effusion and atelectasis, personally reviewed.  Severe bilateral hydronephrosis in the setting of obstruction from  progressive enlarging prostate mass Foley catheter in place, severe bilateral hydronephrosis persists Patient is alert and oriented x4 has not made decision regarding nephrostomy tube placement as recommended by urology and nephrology  Enterococcus bacteremia 2/2 bottles Management as per above Repeated blood cultures on 06/26/2019 negative final.   TTE shows no evidence of endocarditis TEE planned 3/19 to rule out endocarditis Colonoscopy planned once hemodynamically stable to rule out colonic involvement and GI bleed Infectious disease has been consulted and following.  Acute hypoxic respiratory failure secondary to suspected pulmonary edema/bilateral pleural effusion Not on oxygen supplementation at baseline Continue to maintain O2 saturation greater than 92% Continue to encourage use of incentive spirometer and flutter valve.  Hypovolemic hypernatremia in the setting of poor oral intake Likely intravascular volume depleted Sodium rising up to 150 Encourage free water intake  Resolved hypokalemia post repletion Potassium 3.8 from 3.4, received 1 dose of 10 mEq KCl  Suspected CAP Independently reviewed CXR showing increase in pulmonary vascularity with RLL opacity.  CAP could not be excluded On azithromycin and Rocephin day number 3 out of 5 Open procalcitonin in the morning  Severe protein calorie malnutrition Albumin 1.7 BMI 32 with loss of muscle mass Continue to encourage increase in protein calorie intake Dietitian following, appreciate recommendations.  Refractory sinus tachycardia likely multifactorial 2/2 to sepsis versus fever versus other ST on 12 lead EKG Continue po lopressor 12.5 mg BID Continue IV lopressor 2.5 mg q3H prn with HR>130 when MAP>70  Resolved prolonged QTC 12 lead EKG on 3/12 QTC >540 Repeated 12 lead EKG done on 3/14 QTc 353  Left lower extremity pain, DVT ruled out. Chronic left lower extremity weakness with worsening pain Bilateral Doppler  ultrasound negative for DVT.  Severe physical debility Continue PT OT with assistance and fall precaution as tolerated Encourage increase protein calorie intake and mobilize as tolerated  Acute kidney injury on chronic kidney disease stage IIIB likely postrenal versus prerenal 2/2 to obstructive uropathy from prostate cancer versus intravascular volume depletion:  Baseline creatinine appears to be 2.0 with GFR of 36 Presented with creatinine up to 3.8 Creatinine  3.52>> 3.3>> 3.1>> 3.2. Continue to avoid nephrotoxins and hypotension Nephrology following, appreciate assistance. Continue Foley catheter #8 Off iv fluid since 3/18.  Hyperphosphatemia in the setting of acute renal failure Phosphorous 5.1 Deferred management to nephrology  Anemia of chronic disease, positive FOBT and mild hematuria:  Received 2 units of PRBC.  Hg 7.2>>7.6> 7.1>> 7.9 Transfused 1 unit PRBC on 06/27/2019 Transfused 1 U PRBC 06/30/19 Continue PPI Continue to monitor H&H.  Type 2 diabetes with hyperglycemia:  A1C 6.6 on 06/24/19.  Hold off Metformin, home med, may have to dc with poor renal function.   Continue sliding scale insulin.  Metastatic prostate cancer on Zytiga, bladder obstruction, debility and chronic anemia: Followed by his oncologist.  Will have outpatient follow-up after stabilization. Zytiga on hold  Goals of care Palliative care following  DVT prophylaxis: DVT prophylaxis with SCDs Code Status: Full code  Family Communication:  Updated the patient's mother via phone.  Disposition Plan: patient is from home. Anticipated DC to home, Barriers to discharge active treatment, IV antibiotics, inpatient procedures planned with cardiology.   Consultants:   Urology  GI  Oncology  Palliative medicine  Nephrology  Cardiology  Dietitian  Procedures:   Foley cath insertion     Objective: Vitals:   06/26/2019 0600 06/18/2019 0700 07/11/2019 0810 06/26/2019 0938  BP: 118/72  139/75  132/72  Pulse: (!) 121 (!) 125  (!) 125  Resp: (!) 25 (!) 23  (!) 23  Temp:   99.1 F (37.3 C) 98.8 F (37.1 C)  TempSrc:   Axillary Axillary  SpO2: 95% 94%  99%  Weight:      Height:        Intake/Output Summary (Last 24 hours) at 06/22/2019 0943 Last data filed at 06/28/2019 0755 Gross per 24 hour  Intake 2153.48 ml  Output 5400 ml  Net -3246.52 ml   Filed Weights   06/29/19 0500 06/30/19 0500 07/06/2019 0500  Weight: 99.9 kg 102.9 kg 102.5 kg    Exam:  . General: 59 y.o. year-old male well-developed well-nourished no acute distress.  Alert and oriented x3. . Cardiovascular: Tachycardic with no rubs or gallops. [name] Respiratory: Mild rales at bases no wheezing noted.  Poor respiratory effort. . Abdomen: Obese nontender normal bowel sounds present. . Musculoskeletal: No lower extremity edema bilaterally.   [name] Psychiatry: Depressed mood.  Data Reviewed: CBC: Recent Labs  Lab 06/28/19 0316 06/28/19 0316 06/29/19 0212 06/29/19 0212 06/29/19 1054 06/30/19 0156 06/30/19 0839 07/01/19 0209 07/04/2019 0214  WBC 9.3  --  10.0  --   --  8.5  --  7.0 7.8  NEUTROABS 6.6  --  7.4  --   --  6.4  --  5.0 5.5  HGB 8.8*   < > 7.7*   < > 7.5* 6.9* 7.4* 7.4* 7.9*  HCT 31.1*   < > 27.8*   < > 26.0* 24.6* 26.2* 26.3* 27.3*  MCV 91.2  --  93.3  --   --  92.8  --  92.9 92.5  PLT 229  --  206  --   --  159  --  156 196   < > = values in this interval not displayed.   Basic Metabolic Panel: Recent Labs  Lab 06/26/19 0210 06/26/19 8588 06/28/19 0316 06/29/19 0212 06/29/19 1054 06/30/19 0156 07/01/19 0209 07/06/2019 0214  NA 145   < > 145 148*  --  144 146* 150*  K 3.2*   < > 3.6 3.5  --  3.4* 3.4* 3.8  CL 113*   < > 110 112*  --  112* 111 118*  CO2 21*   < > 22 24  --  23 24 24   GLUCOSE 151*   < > 124* 129*  --  214* 214* 186*  BUN 44*   < > 39* 44*  --  52* 53* 51*  CREATININE 3.32*   < > 3.36* 3.52*  --  3.33* 3.10* 3.27*  CALCIUM 9.0   < > 9.1 9.1  --  9.4 9.3 9.4  MG  1.9  --  2.0  --   --   --   --   --   PHOS  --   --   --   --  5.1*  --   --   --    < > = values in this interval not displayed.   GFR: Estimated Creatinine Clearance: 29.8 mL/min (A) (by C-G formula based on SCr of 3.27 mg/dL (H)). Liver Function Tests: Recent Labs  Lab 06/26/19 0210 06/27/19 0317 07/01/19 0209  AST 42* 40 35  ALT 26 26 21   ALKPHOS 144* 158* 145*  BILITOT 0.5 0.8 0.3  PROT 6.0* 5.9* 5.5*  ALBUMIN 1.6* 1.7* 1.5*   No results for input(s): LIPASE, AMYLASE in the last 168 hours. No results for input(s): AMMONIA in the last 168 hours. Coagulation Profile: No results for input(s): INR, PROTIME in the last 168 hours. Cardiac Enzymes: No results for input(s): CKTOTAL, CKMB, CKMBINDEX, TROPONINI in the last 168 hours. BNP (last 3 results) No results for input(s): PROBNP in the last 8760 hours. HbA1C: No results for input(s): HGBA1C in the last 72 hours. CBG: Recent Labs  Lab 07/01/19 0638 07/01/19 1156 07/01/19 1804 07/01/19 2347 07/11/2019 0553  GLUCAP 167* 135* 125* 192* 178*   Lipid Profile: No results for input(s): CHOL, HDL, LDLCALC, TRIG, CHOLHDL, LDLDIRECT in the last 72 hours. Thyroid Function Tests: No results for input(s): TSH, T4TOTAL, FREET4, T3FREE, THYROIDAB in the last 72 hours. Anemia Panel: No results for input(s): VITAMINB12, FOLATE, FERRITIN, TIBC, IRON, RETICCTPCT in the last 72 hours. Urine analysis:    Component Value Date/Time   COLORURINE YELLOW 07/10/2019 1537   APPEARANCEUR CLEAR 06/22/2019 1537   LABSPEC 1.006 06/18/2019 1537   PHURINE 6.0 06/22/2019 1537   GLUCOSEU NEGATIVE 07/04/2019 1537   HGBUR MODERATE (A) 06/15/2019 1537   BILIRUBINUR NEGATIVE 07/10/2019 1537   KETONESUR NEGATIVE 06/14/2019 1537   PROTEINUR 30 (A) 07/02/2019 1537   NITRITE NEGATIVE 06/22/2019 1537   LEUKOCYTESUR LARGE (A) 06/16/2019 1537   Sepsis Labs: @LABRCNTIP (procalcitonin:4,lacticidven:4)  ) Recent Results (from the past 240 hour(s))    Urine culture     Status: Abnormal   Collection Time: 06/15/2019  3:37 PM   Specimen: Urine, Random  Result Value Ref Range Status   Specimen Description   Final    URINE, RANDOM Performed at [facility], [place] [address], [place], [place] [address]    Special Requests   Final    NONE Performed at [facility], [place] [address], [place], [place] [address]    Culture MULTIPLE SPECIES PRESENT, SUGGEST RECOLLECTION (A)  Final   Report Status 06/24/2019 FINAL  Final  Culture, blood (routine x 2)     Status: Abnormal   Collection Time: 06/21/2019  4:56 PM   Specimen: BLOOD  Result Value Ref Range Status   Specimen Description   Final    BLOOD RIGHT ANTECUBITAL Performed at [place] [address] [place], [place] [address]    Special Requests   Final    BOTTLES DRAWN AEROBIC ONLY Blood Culture results may not be optimal due to an excessive volume of blood received in culture bottles Performed at [place] [address], [place], [place] [address]    Culture  Setup Time   Final    AEROBIC BOTTLE ONLY GRAM POSITIVE COCCI IN CHAINS CRITICAL RESULT CALLED TO, READ BACK BY AND VERIFIED WITH: Lavell Luster [facility] 06/25/19 0032 JDW Performed at [facility], [place] [address], [place],  [address]    Culture ENTEROCOCCUS FAECALIS (A)  Final   Report Status 06/26/2019 FINAL  Final   Organism ID, Bacteria ENTEROCOCCUS FAECALIS  Final      Susceptibility   Enterococcus faecalis - MIC*    AMPICILLIN <=2 SENSITIVE Sensitive     VANCOMYCIN 1 SENSITIVE Sensitive     GENTAMICIN SYNERGY SENSITIVE Sensitive     * ENTEROCOCCUS FAECALIS  Blood Culture ID Panel (Reflexed)     Status: Abnormal   Collection Time: 07/09/2019  4:56 PM  Result Value Ref  Range Status   Enterococcus species DETECTED (A) NOT DETECTED Final    Comment: CRITICAL RESULT CALLED TO, READ BACK BY AND VERIFIED WITH: J. GRIMSLEY PHARMD 07/01/2019  0032 JDW    Vancomycin resistance NOT DETECTED NOT DETECTED Final   Listeria monocytogenes NOT DETECTED NOT DETECTED Final   Staphylococcus species NOT DETECTED NOT DETECTED Final   Staphylococcus aureus (BCID) NOT DETECTED NOT DETECTED Final   Streptococcus species NOT DETECTED NOT DETECTED Final   Streptococcus agalactiae NOT DETECTED NOT DETECTED Final   Streptococcus pneumoniae NOT DETECTED NOT DETECTED Final   Streptococcus pyogenes NOT DETECTED NOT DETECTED Final   Acinetobacter baumannii NOT DETECTED NOT DETECTED Final   Enterobacteriaceae species NOT DETECTED NOT DETECTED Final   Enterobacter cloacae complex NOT DETECTED NOT DETECTED Final   Escherichia coli NOT DETECTED NOT DETECTED Final   Klebsiella oxytoca NOT DETECTED NOT DETECTED Final   Klebsiella pneumoniae NOT DETECTED NOT DETECTED Final   Proteus species NOT DETECTED NOT DETECTED Final   Serratia marcescens NOT DETECTED NOT DETECTED Final   Haemophilus influenzae NOT DETECTED NOT DETECTED Final   Neisseria meningitidis NOT DETECTED NOT DETECTED Final   Pseudomonas aeruginosa NOT DETECTED NOT DETECTED Final   Candida albicans NOT DETECTED NOT DETECTED Final   Candida glabrata NOT DETECTED NOT DETECTED Final   Candida krusei NOT DETECTED NOT DETECTED Final   Candida parapsilosis NOT DETECTED NOT DETECTED Final   Candida tropicalis NOT DETECTED NOT DETECTED Final    Comment: Performed at [facility], [address], [place], [place] [address]  Culture, blood (routine x 2)     Status: Abnormal   Collection Time: 07/01/2019  5:16 PM   Specimen: BLOOD  Result Value Ref Range Status   Specimen Description   Final    BLOOD RIGHT ANTECUBITAL Performed at [place] [address] [place], [place] [address]    Special Requests   Final    BOTTLES DRAWN AEROBIC AND ANAEROBIC Blood Culture adequate volume Performed at [place] [address] [place], [place] [address]     Culture  Setup Time   Final    GRAM POSITIVE COCCI IN CHAINS IN BOTH AEROBIC AND ANAEROBIC BOTTLES CRITICAL RESULT CALLED TO, READ BACK BY AND VERIFIED WITH: PHARMD M RENZ [id number] MLM    Culture (A)  Final    ENTEROCOCCUS FAECALIS SUSCEPTIBILITIES PERFORMED ON PREVIOUS CULTURE WITHIN THE LAST 5 DAYS. Performed at [facility], [place] [address], [place], [place] [address]    Report Status 06/26/2019 FINAL  Final  SARS CORONAVIRUS 2 (TAT 6-24 HRS) Nasopharyngeal Nasopharyngeal Swab     Status: None   Collection Time: 07/02/2019  5:20 PM   Specimen: Nasopharyngeal Swab  Result Value Ref Range Status   SARS Coronavirus 2 NEGATIVE NEGATIVE Final    Comment: (NOTE) SARS-CoV-2 target nucleic acids are NOT DETECTED. The SARS-CoV-2 RNA is generally detectable in upper and lower respiratory specimens during the acute phase of infection. Negative results do not preclude SARS-CoV-2 infection, do not rule out co-infections with other pathogens, and should not be used as the sole basis for treatment or other patient management decisions. Negative results must be combined with clinical observations, patient history, and epidemiological information. The expected result is Negative. Fact Sheet for Patients: SugarRoll.be Fact Sheet for Healthcare Providers: https://www.woods-mathews.com/ This test is not yet approved or cleared by the [place] FDA and  has been authorized for detection and/or diagnosis of SARS-CoV-2 by  FDA under an Emergency Use Authorization (EUA). This EUA will remain  in effect (meaning this test can be used) for the duration of the COVID-19 declaration under Section 56 4(b)(1) of the Act, 21 U.S.C. section 360bbb-3(b)(1), unless the authorization is terminated or revoked sooner. Performed at [facility], [place] [address] [place], [place] [address]   MRSA PCR Screening     Status: None   Collection Time: 06/24/19   1:08 AM   Specimen: Nasal Mucosa; Nasopharyngeal  Result Value Ref Range Status   MRSA by PCR NEGATIVE NEGATIVE Final    Comment:        The GeneXpert MRSA Assay (FDA approved for NASAL specimens only), is one component of a comprehensive MRSA colonization surveillance program. It is not intended to diagnose MRSA infection nor to guide or monitor treatment for MRSA infections. Performed at [facility], [place] [address], [place], [place] [address]   Culture, blood (routine x 2)     Status: None   Collection Time: 06/26/19  2:10 AM   Specimen: BLOOD  Result Value Ref Range Status   Specimen Description   Final    BLOOD RIGHT ARM Performed at [place] [address], [place], [place] [address]    Special Requests   Final    BOTTLES DRAWN AEROBIC ONLY Blood Culture adequate volume Performed at [place] [address], [place], [place] [address]    Culture   Final    NO GROWTH 5 DAYS Performed at [facility], [place] [address] [place], [place] [address]    Report Status 07/01/2019 FINAL  Final  Culture, blood (routine x 2)     Status: None   Collection Time: 06/26/19  2:10 AM   Specimen: BLOOD  Result Value Ref Range Status   Specimen Description   Final    BLOOD RIGHT HAND Performed at [place] [address], [place], [place] [address]    Special Requests   Final    BOTTLES DRAWN AEROBIC ONLY Blood Culture adequate volume Performed at [place] [address], [place], [place] [address]    Culture   Final    NO GROWTH 5 DAYS Performed at [facility], [place] [address] [place], [place] [address]    Report Status 07/01/2019 FINAL  Final      Studies: No results found.  Scheduled Meds: . Chlorhexidine Gluconate Cloth  6 each Topical Daily  . feeding supplement (ENSURE ENLIVE)  237 mL Oral TID BM  . feeding supplement (PRO-STAT SUGAR FREE 64)   30 mL Oral BID  . gabapentin  100 mg Oral BID  . insulin aspart  0-9 Units Subcutaneous Q6H  . mouth rinse  15 mL Mouth Rinse BID  . metoprolol tartrate  12.5 mg Oral BID  . multivitamin with minerals  1 tablet Oral Daily  . pantoprazole  40 mg Oral Q0600  . protein supplement  1 Scoop Oral TID WC    Continuous Infusions: . ampicillin (OMNIPEN) IV Stopped (06/14/2019 0618)  . azithromycin 500 mg (07/03/2019 0841)  . cefTRIAXone (ROCEPHIN)  IV Stopped (07/06/2019 0840)     LOS: 9 days     Kayleen Memos, MD [facility] Pager [phone]  If 7PM-7AM, please contact night-coverage [email or web address] Password [facility] 07/01/2019, 9:43 AM

## 2019-07-02 NOTE — Transfer of Care (Signed)
Immediate Anesthesia Transfer of Care Note  Patient: Jeffery [name]  Procedure(s) Performed: TRANSESOPHAGEAL ECHOCARDIOGRAM (TEE) (N/A )  Patient Location: Endoscopy Unit  Anesthesia Type:MAC  Level of Consciousness: drowsy and responds to stimulation  Airway & Oxygen Therapy: Patient Spontanous Breathing and Patient connected to nasal cannula oxygen  Post-op Assessment: Report given to RN and Post -op Vital signs reviewed and stable  Post vital signs: Reviewed and stable  Last Vitals:  Vitals Value Taken Time  BP 83/47 06/14/2019 1021  Temp    Pulse 107 06/20/2019 1022  Resp 31 07/04/2019 1022  SpO2 94 % 06/30/2019 1022  Vitals shown include unvalidated device data.  Last Pain:  Vitals:   06/19/2019 0938  TempSrc: Axillary  PainSc: 4       Patients Stated Pain Goal: 4 (48/47/20 7218)  Complications: No apparent anesthesia complications

## 2019-07-02 NOTE — Anesthesia Preprocedure Evaluation (Signed)
Anesthesia Evaluation  Patient identified by MRN, date of birth, ID band Patient awake    Reviewed: Allergy & Precautions, NPO status , Patient's Chart, lab work & pertinent test results  Airway Mallampati: II  TM Distance: >3 FB Neck ROM: Full    Dental   Pulmonary former smoker,    breath sounds clear to auscultation       Cardiovascular  Rhythm:Regular Rate:Normal     Neuro/Psych    GI/Hepatic   Endo/Other  diabetes  Renal/GU      Musculoskeletal   Abdominal   Peds  Hematology   Anesthesia Other Findings Lethargic, answers questions   Reproductive/Obstetrics                             Anesthesia Physical Anesthesia Plan  ASA: III  Anesthesia Plan: MAC   Post-op Pain Management:    Induction: Intravenous  PONV Risk Score and Plan: Ondansetron  Airway Management Planned: Natural Airway and Nasal Cannula  Additional Equipment:   Intra-op Plan:   Post-operative Plan:   Informed Consent: I have reviewed the patients History and Physical, chart, labs and discussed the procedure including the risks, benefits and alternatives for the proposed anesthesia with the patient or authorized representative who has indicated his/her understanding and acceptance.       Plan Discussed with: CRNA and Anesthesiologist  Anesthesia Plan Comments:         Anesthesia Quick Evaluation

## 2019-07-02 NOTE — Progress Notes (Addendum)
Referring Physician(s): Hall,C/Bell,E  Supervising Physician: Arne Cleveland  Patient Status:  Jeffery [name] - In-pt  Chief Complaint: Dyspnea/pleural effusions, bilateral hydronephrosis   Subjective: Patient familiar to our service from right thoracentesis on 12/15/2018.  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  Found to have enterococcal bacteremia.  COVID-19 negative.  Latest CT of the abdomen pelvis performed on 3/16 revealed interval increase in moderate bilateral effusions, widespread skeletal metastasis, persistent bilateral hydronephrosis and hydroureter to the level of the bladder.  Latest creatinine 3.27; per nephrology patient not a good candidate for hemodialysis.  Urology previously consulted and recommended bilateral nephrostomies but patient refused.  Request now received again from [facility] for consideration of bilateral nephrostomies as well as for thoracentesis.   Allergies: Patient has no known allergies.  Medications: Prior to Admission medications   Medication Sig Start Date End Date Taking? Authorizing Provider  Amino Acids-Protein Hydrolys (FEEDING SUPPLEMENT, PRO-STAT SUGAR FREE 64,) LIQD Take 30 mLs by mouth 3 (three) times daily. 10/14/18  Yes Hongalgi, Lenis Dickinson, MD  feeding supplement, ENSURE ENLIVE, (ENSURE ENLIVE) LIQD Take 237 mLs by mouth daily. 10/14/18  Yes Hongalgi, Lenis Dickinson, MD  finasteride (PROSCAR) 5 MG tablet Take 5 mg by mouth daily. 02/12/18  Yes [provider]  furosemide (LASIX) 40 MG tablet Take 40 mg by mouth daily.   Yes [provider]  gabapentin (NEURONTIN) 300 MG capsule Take 1-2 capsules (300-600 mg total) by mouth 3 (three) times daily. 363m in am and 6051min evening Patient taking differently: Take 600  mg by mouth 3 (three) times daily.  05/31/19  Yes Shadad, FiMathis DadMD  HYDROcodone-acetaminophen (NORCO/VICODIN) 5-325 MG tablet TAKE 1 TO 2 TABLETS BY MOUTH EVERY 4 TO 6 HOURS AS NEEDED FOR PAIN (MAX OF 8 TABLETS IN 24 HOURS) Patient taking differently: Take 1-2 tablets by mouth every 4 (four) hours as needed for moderate pain or severe pain. TAKE 1 TO 2 TABLETS BY MOUTH EVERY 4 TO 6 HOURS AS NEEDED FOR PAIN (MAX OF 8 TABLETS IN 24 HOURS) 06/15/19  Yes ShWyatt PortelaMD  metFORMIN (GLUCOPHAGE-XR) 500 MG 24 hr tablet Take 500-1,000 mg by mouth 2 (two) times daily. Take 2 tablets (1000 mg) in the am and Take 1 tablet at bedtime 01/12/19  Yes [provider]  Multiple Vitamin (MULTIVITAMIN WITH MINERALS) TABS tablet Take 1 tablet by mouth daily. 10/15/18  Yes Hongalgi, AnLenis DickinsonMD  predniSONE (DELTASONE) 5 MG tablet Take 1 tablet (5 mg total) by mouth daily with breakfast. 11/03/18  Yes Shadad, FiMathis DadMD  tamsulosin (FLOMAX) 0.4 MG CAPS capsule Take 2 capsules (0.8 mg total) by mouth daily after supper. 10/14/18  Yes Hongalgi, AnLenis DickinsonMD  vitamin B-12 (CYANOCOBALAMIN) 1000 MCG tablet Take 1 tablet (1,000 mcg total) by mouth daily. 10/14/18  Yes Hongalgi, AnLenis DickinsonMD  ZYTIGA 250 MG tablet TAKE 4 TABLETS BY MOUTH ONCE DAILY AS DIRECTED.  TAKE 1 HOUR BEFORE OR 2 HOURS AFTER A MEAL Patient taking differently: Take 1,000 mg by mouth daily.  12/18/18  Yes ShWyatt PortelaMD  Blood Glucose Monitoring Suppl (RELION CONFIRM GLUCOSE MONITOR) w/Device KIT Use 4 times daily before meals and bedtime as directed. 07/31/16   StHolley RaringMD  glucose blood (RELION GLUCOSE TEST STRIPS) test strip Use  as instructed 07/31/16   Holley Raring, MD  Lancets 30G MISC Use 4 times daily before meals as directed. 07/31/16   Holley Raring, MD     Vital Signs: BP (!) 162/99   Pulse (!) 116   Temp 98.6 F (37 C) (Axillary)   Resp (!) 28   Ht 5' 9"  (1.753 m)   Wt 225 lb 15.5 oz (102.5 kg)   SpO2 95%   BMI 33.37 kg/m    Physical Exam patient awake, flat affect, will briefly answer a few simple questions.  Also with some occasional fixed stares; chest with diminished breath sounds at bases.  Heart with tachycardic but regular rhythm.  Abdomen soft, positive bowel sounds, currently nontender.  No lower extremity edema.  Imaging: CT ABDOMEN PELVIS WO CONTRAST  Result Date: 06/29/2019 CLINICAL DATA:  Stage IV prostate carcinoma. Chronic renal disease. Generalized weakness. Painful urination. Foley catheter placed upon admission. Anemia. EXAM: CT CHEST, ABDOMEN AND PELVIS WITHOUT CONTRAST TECHNIQUE: Multidetector CT imaging of the chest, abdomen and pelvis was performed following the standard protocol without IV contrast. COMPARISON:  CT 06/24/2019, bone scan 12/23/2018 CT FINDINGS: CT CHEST FINDINGS Cardiovascular: No significant vascular findings. Normal heart size. No pericardial effusion. Mediastinum/Nodes: No axillary supraclavicular adenopathy. No mediastinal hilar adenopathy. No pericardial effusion Lungs/Pleura: Bilateral pleural effusions slightly increased from prior exam. There is a bibasilar atelectasis also mildly increased in LEFT lower lobe. No suspicious pulmonary nodules. 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 Several lesions are expansile within the ribs. For example the LEFT 5th rib (image 60/6) CT ABDOMEN AND PELVIS FINDINGS Hepatobiliary: No focal hepatic lesion on noncontrast exam. Gallstone within neck of the gallbladder. No gallbladder distension Pancreas: Pancreas is normal. No ductal dilatation. No pancreatic inflammation. Spleen: Normal spleen Adrenals/urinary tract: Adrenal glands are normal. There is bilateral severe hydronephrosis and hydroureter. Hydroureter extends to the urinary bladder. Findings are similar to comparison CT 06/22/2019. There is interval placement of a Foley catheter. The previous described nodule thickening the bladder is not well  appreciated. The bladder is thick-walled and collapsed. Stomach/Bowel: Stomach, small-bowel and cecum are normal. The appendix is not identified but there is no pericecal inflammation to suggest appendicitis. The colon and rectosigmoid colon are normal. Rectal thermometer noted Vascular/Lymphatic: Abdominal aorta is normal caliber with atherosclerotic calcification. There is no retroperitoneal or periportal lymphadenopathy. No pelvic lymphadenopathy. Reproductive: Prostate normal Other: No free fluid. Musculoskeletal: Widespread sclerotic skeletal metastasis unchanged. Pathologic fracture identified. IMPRESSION: Chest Impression: 1. No evidence of thoracic metastasis. 2. Interval increase in moderate effusions and basilar atelectasis. 3. Widespread skeletal metastasis Abdomen / Pelvis Impression: 1. Persistent bilateral hydronephrosis and hydroureter to the level of the bladder. Interval placement Foley catheter within thickened high-density bladder. 2. No inflammation or infection identified in the abdomen pelvis. 3. No source of hemorrhage other than high-density thickening within the bladder. Electronically Signed   By: Suzy Bouchard M.D.   On: 06/29/2019 13:05   CT CHEST WO CONTRAST  Result Date: 06/29/2019 CLINICAL DATA:  Stage IV prostate carcinoma. Chronic renal disease. Generalized weakness. Painful urination. Foley catheter placed upon admission. Anemia. EXAM: CT CHEST, ABDOMEN AND PELVIS WITHOUT CONTRAST TECHNIQUE: Multidetector CT imaging of the chest, abdomen and pelvis was performed following the standard protocol without IV contrast. COMPARISON:  CT 07/03/2019, bone scan 12/23/2018 CT FINDINGS: CT CHEST FINDINGS Cardiovascular: No significant vascular findings. Normal heart size. No pericardial effusion. Mediastinum/Nodes: No axillary supraclavicular adenopathy. No mediastinal hilar adenopathy. No pericardial effusion Lungs/Pleura:  Bilateral pleural effusions slightly increased from prior exam.  There is a bibasilar atelectasis also mildly increased in LEFT lower lobe. No suspicious pulmonary nodules. 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 Several lesions are expansile within the ribs. For example the LEFT 5th rib (image 60/6) CT ABDOMEN AND PELVIS FINDINGS Hepatobiliary: No focal hepatic lesion on noncontrast exam. Gallstone within neck of the gallbladder. No gallbladder distension Pancreas: Pancreas is normal. No ductal dilatation. No pancreatic inflammation. Spleen: Normal spleen Adrenals/urinary tract: Adrenal glands are normal. There is bilateral severe hydronephrosis and hydroureter. Hydroureter extends to the urinary bladder. Findings are similar to comparison CT 07/12/2019. There is interval placement of a Foley catheter. The previous described nodule thickening the bladder is not well appreciated. The bladder is thick-walled and collapsed. Stomach/Bowel: Stomach, small-bowel and cecum are normal. The appendix is not identified but there is no pericecal inflammation to suggest appendicitis. The colon and rectosigmoid colon are normal. Rectal thermometer noted Vascular/Lymphatic: Abdominal aorta is normal caliber with atherosclerotic calcification. There is no retroperitoneal or periportal lymphadenopathy. No pelvic lymphadenopathy. Reproductive: Prostate normal Other: No free fluid. Musculoskeletal: Widespread sclerotic skeletal metastasis unchanged. Pathologic fracture identified. IMPRESSION: Chest Impression: 1. No evidence of thoracic metastasis. 2. Interval increase in moderate effusions and basilar atelectasis. 3. Widespread skeletal metastasis Abdomen / Pelvis Impression: 1. Persistent bilateral hydronephrosis and hydroureter to the level of the bladder. Interval placement Foley catheter within thickened high-density bladder. 2. No inflammation or infection identified in the abdomen pelvis. 3. No source of hemorrhage other than high-density  thickening within the bladder. Electronically Signed   By: Suzy Bouchard M.D.   On: 06/29/2019 13:05   ECHO TEE  Result Date: 06/14/2019    TRANSESOPHOGEAL ECHO REPORT   Patient Name:   ATHONY COPPA Date of Exam: 07/03/2019 Medical Rec #:  [id number]           Height:       69.0 in Accession #:    [id number]          Weight:       226.0 lb Date of Birth:  1960-07-16           BSA:          2.176 m Patient Age:    59 years            BP:           132/75 mmHg Patient Gender: M                   HR:           114 bpm. Exam Location:  Inpatient Procedure: Transesophageal Echo Indications:     Bacteremia  History:         Patient has prior history of Echocardiogram examinations, most                  recent 06/16/2019. Risk Factors:Diabetes, Hypertension and                  Former Smoker. CKD.  Sonographer:     Clayton Lefort RDCS (AE) Referring Phys:  [id number] Abigail Butts Diagnosing Phys: Buford Dresser MD PROCEDURE: After discussion of the risks and benefits of a TEE, an informed consent was obtained from the patient. The transesophogeal probe was passed without difficulty through the esophogus of the patient. Sedation performed by different physician. The patient was monitored while under deep sedation. Anesthestetic sedation was provided intravenously by  Anesthesiology: 200.26m of Propofol. Image quality was adequate. The patient developed no complications during the procedure. IMPRESSIONS  1. Left ventricular ejection fraction, by estimation, is 50 to 55%. The left ventricle has low normal function. The left ventricle has no regional wall motion abnormalities. Left ventricular diastolic function could not be evaluated.  2. Right ventricular systolic function is normal. The right ventricular size is normal. There is normal pulmonary artery systolic pressure.  3. No left atrial/left atrial appendage thrombus was detected.  4. The mitral valve is normal in structure. Trivial mitral valve  regurgitation. No evidence of mitral stenosis.  5. The aortic valve is tricuspid. Aortic valve regurgitation is not visualized. No aortic stenosis is present.  6. There is mild (Grade II) plaque involving the descending aorta. Conclusion(s)/Recommendation(s): No evidence of vegetation/infective endocarditis on this transesophageal echocardiogram. FINDINGS  Left Ventricle: Left ventricular ejection fraction, by estimation, is 50 to 55%. The left ventricle has low normal function. The left ventricle has no regional wall motion abnormalities. The left ventricular internal cavity size was normal in size. There is no left ventricular hypertrophy. Left ventricular diastolic function could not be evaluated. Right Ventricle: The right ventricular size is normal. No increase in right ventricular wall thickness. Right ventricular systolic function is normal. There is normal pulmonary artery systolic pressure. The tricuspid regurgitant velocity is 1.86 m/s, and  with an assumed right atrial pressure of 8 mmHg, the estimated right ventricular systolic pressure is 256.3mmHg. Left Atrium: Left atrial size was not assessed. No left atrial/left atrial appendage thrombus was detected. Right Atrium: Right atrial size was not assessed. Pericardium: Trivial pericardial effusion is present. Mitral Valve: The mitral valve is normal in structure. Trivial mitral valve regurgitation. No evidence of mitral valve stenosis. There is no evidence of mitral valve vegetation. Tricuspid Valve: The tricuspid valve is normal in structure. Tricuspid valve regurgitation is trivial. No evidence of tricuspid stenosis. There is no evidence of tricuspid valve vegetation. Aortic Valve: The aortic valve is tricuspid. Aortic valve regurgitation is not visualized. No aortic stenosis is present. There is no evidence of aortic valve vegetation. Pulmonic Valve: The pulmonic valve was normal in structure. Pulmonic valve regurgitation is not visualized. No evidence  of pulmonic stenosis. There is no evidence of pulmonic valve vegetation. Aorta: The aortic root is normal in size and structure. There is mild (Grade II) plaque involving the descending aorta. IAS/Shunts: No atrial level shunt detected by color flow Doppler. There is no evidence of a patent foramen ovale.  TRICUSPID VALVE TR Peak grad:   13.8 mmHg TR Vmax:        186.00 cm/s BBuford DresserMD Electronically signed by BBuford DresserMD Signature Date/Time: 07/01/2019/2:08:54 PM    Final     Labs:  CBC: Recent Labs    06/29/19 0212 06/29/19 1054 06/30/19 0156 06/30/19 0839 07/01/19 0209 06/28/2019 0214  WBC 10.0  --  8.5  --  7.0 7.8  HGB 7.7*   < > 6.9* 7.4* 7.4* 7.9*  HCT 27.8*   < > 24.6* 26.2* 26.3* 27.3*  PLT 206  --  159  --  156 196   < > = values in this interval not displayed.    COAGS: No results for input(s): INR, APTT in the last 8760 hours.  BMP: Recent Labs    06/29/19 0212 06/30/19 0156 07/01/19 0209 06/24/2019 0214  NA 148* 144 146* 150*  K 3.5 3.4* 3.4* 3.8  CL 112* 112* 111 118*  CO2 24  23 24 24   GLUCOSE 129* 214* 214* 186*  BUN 44* 52* 53* 51*  CALCIUM 9.1 9.4 9.3 9.4  CREATININE 3.52* 3.33* 3.10* 3.27*  GFRNONAA 18* 20* 21* 20*  GFRAA 21* 23* 25* 23*    LIVER FUNCTION TESTS: Recent Labs    06/17/2019 0216 06/26/19 0210 06/27/19 0317 07/01/19 0209  BILITOT 0.5 0.5 0.8 0.3  AST 29 42* 40 35  ALT 22 26 26 21   ALKPHOS 158* 144* 158* 145*  PROT 6.1* 6.0* 5.9* 5.5*  ALBUMIN 2.0* 1.6* 1.7* 1.5*    Assessment and Plan: 59 yo WM with past medical history significant for stage IV prostate cancer with mets to the bones, diabetes, polyneuropathy, hyperlipidemia, chronic kidney disease, anemia, who recently presented to [facility] with generalized weakness, hypoxia ,tachycardia and dysuria.  Found to have enterococcal bacteremia.  COVID-19 negative.  Latest CT of the abdomen pelvis performed on 3/16 revealed interval increase in  moderate bilateral effusions, widespread skeletal metastasis, persistent bilateral hydronephrosis and hydroureter to the level of the bladder.  Latest creatinine 3.27; per nephrology patient not a good candidate for hemodialysis.  Urology previously consulted and recommended bilateral nephrostomies but patient refused.  Request now received again from [facility] for consideration of bilateral nephrostomies as well as for thoracentesis..  Imaging studies were reviewed by Dr. Vernard Gambles.  Details/risks of procedures, including but not limited to, internal bleeding, infection, injury to adjacent structures/pneumothorax discussed with patient and mother.  Patient has yet to make definitive decision whether to proceed with above procedures and is waiting on son to arrive for further discussion.  We will continue to monitor and make pt n.p.o. after midnight in case he chooses to proceed. Palliative care consult also in place.  Dr. Ileene Hutchinson MD at [facility] 3/20) also aware.   Electronically Signed: D. Rowe Robert, PA-C 07/11/2019, 4:15 PM   I spent a total of 30 minutes at the the patient's bedside AND on the patient's hospital floor or unit, greater than 50% of which was counseling/coordinating care for thoracentesis as well as bilateral percutaneous nephrostomies    Patient ID: Rene Paci, male   DOB: 14-Mar-1961, 59 y.o.   MRN: [id number]

## 2019-07-02 NOTE — TOC Progression Note (Signed)
Transition of Care [facility]) - Progression Note    Patient Details  Name: Jeffery [name] MRN: [id number] Date of Birth: 05-03-1960  Transition of Care [facility]) CM/SW [place], [place] Phone Number: 06/26/2019, 2:32 PM  Clinical Narrative:   Patient has tentative bed offer from [facility], and mother is happy with that location.  They are checking his insurance to make sure coverage is in place, and will let us know if he will have to forfeit any of his check while there for 30 days.  TOC will continue to follow during the course of hospitalization.     Expected Discharge Plan: Skilled Nursing Facility Barriers to Discharge: SNF Pending bed offer  Expected Discharge Plan and Services Expected Discharge Plan: [place]   Discharge Planning Services: CM Consult Post Acute Care Choice: [place] Living arrangements for the past 2 months: Single Family Home                                       Social Determinants of Health (SDOH) Interventions    Readmission Risk Interventions No flowsheet data found.

## 2019-07-02 NOTE — Progress Notes (Signed)
OT Cancellation Note  Patient Details Name: MILES LEYDA MRN: [id number] DOB: 1960-05-22   Cancelled Treatment:    Reason Eval/Treat Not Completed: Patient at procedure or test/ unavailable  Daiva Huge Lorraine-COTA/L 07/04/2019, 10:33 AM

## 2019-07-02 NOTE — Progress Notes (Signed)
Procedure time for TEE rescheduled earlier. [facility] called and pick up time moved to earlier for 0900 procedure arrival time. Primary RN at [facility] notifed of this.

## 2019-07-02 NOTE — Progress Notes (Signed)
PT Cancellation Note  Patient Details Name: Jeffery [name] MRN: [id number] DOB: Apr 23, 1960   Cancelled Treatment:     pt having a TEE at [facility] today.  Will attempt another day as schedule permits.   Rica Koyanagi  PTA Acute  Rehabilitation Services Pager      [phone] Office      [phone]

## 2019-07-02 NOTE — Interval H&P Note (Signed)
History and Physical Interval Note:  07/03/2019 9:49 AM  Jeffery [name]  has presented today for surgery, with the diagnosis of BACTEREMIA.  The various methods of treatment have been discussed with the patient and family. After consideration of risks, benefits and other options for treatment, the patient has consented to  Procedure(s): TRANSESOPHAGEAL ECHOCARDIOGRAM (TEE) (N/A) as a surgical intervention.  The patient's history has been reviewed, patient examined, no change in status, stable for surgery.  I have reviewed the patient's chart and labs.  Questions were answered to the patient's satisfaction.     Lida Berkery Harrell Gave

## 2019-07-02 NOTE — Anesthesia Postprocedure Evaluation (Signed)
Anesthesia Post Note  Patient: Jeffery [name]  Procedure(s) Performed: TRANSESOPHAGEAL ECHOCARDIOGRAM (TEE) (N/A )     Patient location during evaluation: Endoscopy Anesthesia Type: MAC Level of consciousness: awake and alert Pain management: pain level controlled Vital Signs Assessment: post-procedure vital signs reviewed and stable Respiratory status: spontaneous breathing, nonlabored ventilation, respiratory function stable and patient connected to nasal cannula oxygen Cardiovascular status: stable and blood pressure returned to baseline Postop Assessment: no apparent nausea or vomiting Anesthetic complications: no    Last Vitals:  Vitals:   06/21/2019 1200 07/04/2019 1235  BP: 113/62   Pulse: (!) 122   Resp: 20   Temp:  37 C  SpO2: 93%     Last Pain:  Vitals:   06/26/2019 1235  TempSrc: Axillary  PainSc:                  Timberlyn Pickford COKER

## 2019-07-02 NOTE — Progress Notes (Signed)
Patients son, Morley Gaumer, is at bedside. Patient verbalized that he is in agreance with having nephrostomy tubes placed tomorrow. He is also agreeable to having thoracentesis done. He wanted his son Kathlyn [place] to sign the consent for him. Both consents have been placed on the front of the chart.  Patient verbalized understanding and has no questions at this time. He is alert and oriented x4 at this time.

## 2019-07-02 NOTE — Progress Notes (Signed)
  Echocardiogram Echocardiogram Transesophageal has been performed.  Jeffery [name] 06/21/2019, 10:49 AM

## 2019-07-02 NOTE — Anesthesia Procedure Notes (Signed)
Procedure Name: MAC Date/Time: 07/10/2019 9:55 AM Performed by: Janace Litten, CRNA Pre-anesthesia Checklist: Patient identified, Emergency Drugs available, Suction available and Patient being monitored Patient Re-evaluated:Patient Re-evaluated prior to induction Oxygen Delivery Method: Nasal cannula

## 2019-07-02 NOTE — CV Procedure (Signed)
    TRANSESOPHAGEAL ECHOCARDIOGRAM   NAME:  Jeffery [name]   MRN: [id number] DOB:  29-Nov-1960   ADMIT DATE: 07/14/2019  INDICATIONS: Bacteremia  PROCEDURE:   Informed consent was obtained prior to the procedure. The risks, benefits and alternatives for the procedure were discussed and the patient comprehended these risks.  Risks include, but are not limited to, cough, sore throat, vomiting, nausea, somnolence, esophageal and stomach trauma or perforation, bleeding, low blood pressure, aspiration, pneumonia, infection, trauma to the teeth and death.    Procedural time out performed. Monitored anesthesia care was administered under the supervision of Dr. Linna Caprice. The patient received a total of 400 mg phenylephrine and 200 mg propofol.  The transesophageal probe was inserted in the esophagus and stomach without difficulty and multiple views were obtained.    COMPLICATIONS:    There were no immediate complications.  FINDINGS:  LEFT VENTRICLE: EF = 50-55%. No regional wall motion abnormalities.  RIGHT VENTRICLE: Normal size and function.   LEFT ATRIUM: No thrombus/mass.  LEFT ATRIAL APPENDAGE: No thrombus/mass.   RIGHT ATRIUM: No thrombus/mass.  AORTIC VALVE:  Trileaflet. No regurgitation. No vegetation.  MITRAL VALVE:    Normal structure. Trivial regurgitation. No vegetation.  TRICUSPID VALVE: Normal structure. Trivial regurgitation. No vegetation.  PULMONIC VALVE: Grossly normal structure. No significant regurgitation. No apparent vegetation.  INTERATRIAL SEPTUM: No PFO or ASD seen by color Doppler.  PERICARDIUM: Trivial effusion noted.  DESCENDING AORTA: Mild diffuse plaque seen   CONCLUSION: No evidence of endocarditis   Buford Dresser, MD, PhD [facility]  [address] [place], [place] [address] [phone]   10:16 AM

## 2019-07-03 ENCOUNTER — Inpatient Hospital Stay (HOSPITAL_COMMUNITY): Payer: Medicaid Other

## 2019-07-03 HISTORY — PX: IR NEPHROSTOMY PLACEMENT RIGHT: IMG6064

## 2019-07-03 HISTORY — PX: IR NEPHROSTOMY PLACEMENT LEFT: IMG6063

## 2019-07-03 LAB — CBC WITH DIFFERENTIAL/PLATELET
Abs Immature Granulocytes: 0.59 10*3/uL — ABNORMAL HIGH (ref 0.00–0.07)
Basophils Absolute: 0 10*3/uL (ref 0.0–0.1)
Basophils Relative: 0 %
Eosinophils Absolute: 0.1 10*3/uL (ref 0.0–0.5)
Eosinophils Relative: 2 %
HCT: 28.1 % — ABNORMAL LOW (ref 39.0–52.0)
Hemoglobin: 7.8 g/dL — ABNORMAL LOW (ref 13.0–17.0)
Immature Granulocytes: 7 %
Lymphocytes Relative: 16 %
Lymphs Abs: 1.3 10*3/uL (ref 0.7–4.0)
MCH: 26.4 pg (ref 26.0–34.0)
MCHC: 27.8 g/dL — ABNORMAL LOW (ref 30.0–36.0)
MCV: 94.9 fL (ref 80.0–100.0)
Monocytes Absolute: 0.5 10*3/uL (ref 0.1–1.0)
Monocytes Relative: 6 %
Neutro Abs: 5.8 10*3/uL (ref 1.7–7.7)
Neutrophils Relative %: 69 %
Platelets: 173 10*3/uL (ref 150–400)
RBC: 2.96 MIL/uL — ABNORMAL LOW (ref 4.22–5.81)
RDW: 18.4 % — ABNORMAL HIGH (ref 11.5–15.5)
WBC: 8.4 10*3/uL (ref 4.0–10.5)
nRBC: 0 % (ref 0.0–0.2)

## 2019-07-03 LAB — BASIC METABOLIC PANEL
Anion gap: 10 (ref 5–15)
Anion gap: 10 (ref 5–15)
Anion gap: 10 (ref 5–15)
Anion gap: 9 (ref 5–15)
BUN: 50 mg/dL — ABNORMAL HIGH (ref 6–20)
BUN: 57 mg/dL — ABNORMAL HIGH (ref 6–20)
BUN: 60 mg/dL — ABNORMAL HIGH (ref 6–20)
BUN: 59 mg/dL — ABNORMAL HIGH (ref 6–20)
CO2: 26 mmol/L (ref 22–32)
CO2: 28 mmol/L (ref 22–32)
CO2: 28 mmol/L (ref 22–32)
CO2: 28 mmol/L (ref 22–32)
Calcium: 9.7 mg/dL (ref 8.9–10.3)
Calcium: 10.4 mg/dL — ABNORMAL HIGH (ref 8.9–10.3)
Calcium: 10.3 mg/dL (ref 8.9–10.3)
Calcium: 10.3 mg/dL (ref 8.9–10.3)
Chloride: 117 mmol/L — ABNORMAL HIGH (ref 98–111)
Chloride: 124 mmol/L — ABNORMAL HIGH (ref 98–111)
Chloride: 125 mmol/L — ABNORMAL HIGH (ref 98–111)
Chloride: 126 mmol/L — ABNORMAL HIGH (ref 98–111)
Creatinine, Ser: 3.2 mg/dL — ABNORMAL HIGH (ref 0.61–1.24)
Creatinine, Ser: 3.23 mg/dL — ABNORMAL HIGH (ref 0.61–1.24)
Creatinine, Ser: 3.28 mg/dL — ABNORMAL HIGH (ref 0.61–1.24)
Creatinine, Ser: 3.41 mg/dL — ABNORMAL HIGH (ref 0.61–1.24)
GFR calc Af Amer: 24 mL/min — ABNORMAL LOW (ref >60)
GFR calc Af Amer: 24 mL/min — ABNORMAL LOW (ref >60)
GFR calc Af Amer: 23 mL/min — ABNORMAL LOW (ref >60)
GFR calc Af Amer: 22 mL/min — ABNORMAL LOW (ref >60)
GFR calc non Af Amer: 21 mL/min — ABNORMAL LOW (ref >60)
GFR calc non Af Amer: 20 mL/min — ABNORMAL LOW (ref >60)
GFR calc non Af Amer: 20 mL/min — ABNORMAL LOW (ref >60)
GFR calc non Af Amer: 19 mL/min — ABNORMAL LOW (ref >60)
Glucose, Bld: 182 mg/dL — ABNORMAL HIGH (ref 70–99)
Glucose, Bld: 203 mg/dL — ABNORMAL HIGH (ref 70–99)
Glucose, Bld: 179 mg/dL — ABNORMAL HIGH (ref 70–99)
Glucose, Bld: 190 mg/dL — ABNORMAL HIGH (ref 70–99)
Potassium: 3.9 mmol/L (ref 3.5–5.1)
Potassium: 4.4 mmol/L (ref 3.5–5.1)
Potassium: 4.1 mmol/L (ref 3.5–5.1)
Potassium: 4.2 mmol/L (ref 3.5–5.1)
Sodium: 153 mmol/L — ABNORMAL HIGH (ref 135–145)
Sodium: 162 mmol/L (ref 135–145)
Sodium: 163 mmol/L (ref 135–145)
Sodium: 163 mmol/L (ref 135–145)

## 2019-07-03 LAB — GLUCOSE, CAPILLARY
Glucose-Capillary: 186 mg/dL — ABNORMAL HIGH (ref 70–99)
Glucose-Capillary: 156 mg/dL — ABNORMAL HIGH (ref 70–99)
Glucose-Capillary: 150 mg/dL — ABNORMAL HIGH (ref 70–99)
Glucose-Capillary: 180 mg/dL — ABNORMAL HIGH (ref 70–99)
Glucose-Capillary: 169 mg/dL — ABNORMAL HIGH (ref 70–99)
Glucose-Capillary: 153 mg/dL — ABNORMAL HIGH (ref 70–99)

## 2019-07-03 LAB — PROCALCITONIN: Procalcitonin: 11.6 ng/mL

## 2019-07-03 LAB — PROTIME-INR
INR: 1.3 — ABNORMAL HIGH (ref 0.8–1.2)
Prothrombin Time: 16.4 seconds — ABNORMAL HIGH (ref 11.4–15.2)

## 2019-07-03 LAB — MAGNESIUM: Magnesium: 2.1 mg/dL (ref 1.7–2.4)

## 2019-07-03 LAB — TSH: TSH: 2.218 u[IU]/mL (ref 0.350–4.500)

## 2019-07-03 LAB — SODIUM: Sodium: 163 mmol/L (ref 135–145)

## 2019-07-03 LAB — PHOSPHORUS: Phosphorus: 4.2 mg/dL (ref 2.5–4.6)

## 2019-07-03 IMAGING — XA IR NEPHROSTOMY PLACEMENT RIGHT
2 series · 7 of 7 positions shown · non-contrast
Comparison: None.

INDICATION: 58-year-old male with metastatic prostate cancer, bilateral
obstructed hydronephrosis and acute kidney injury. He requires
placement of bilateral percutaneous nephrostomy tubes.
TECHNIQUE: The procedure, risks, benefits, and alternatives were explained to
the patient. Questions regarding the procedure were encouraged and
answered. The patient understands and consents to the procedure.

[Series 1: (id) · 5 of 5 slices shown]
[im 1/5]
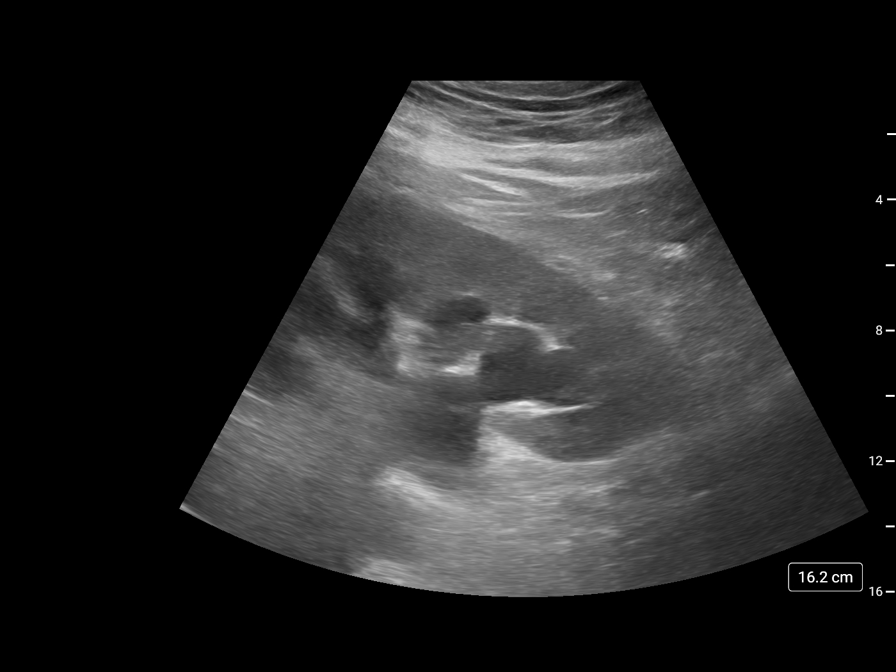
[im 2/5]
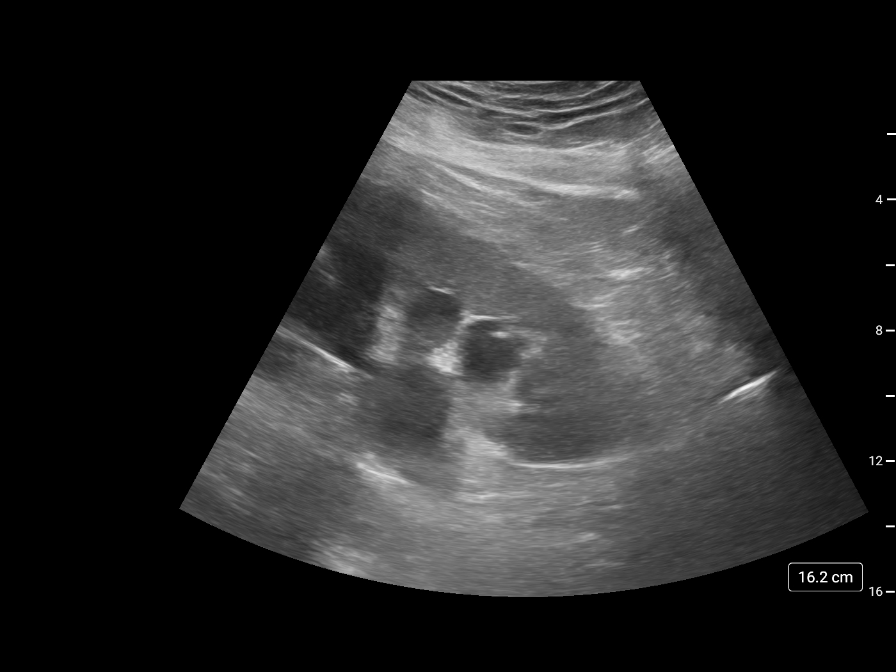
[im 3/5]
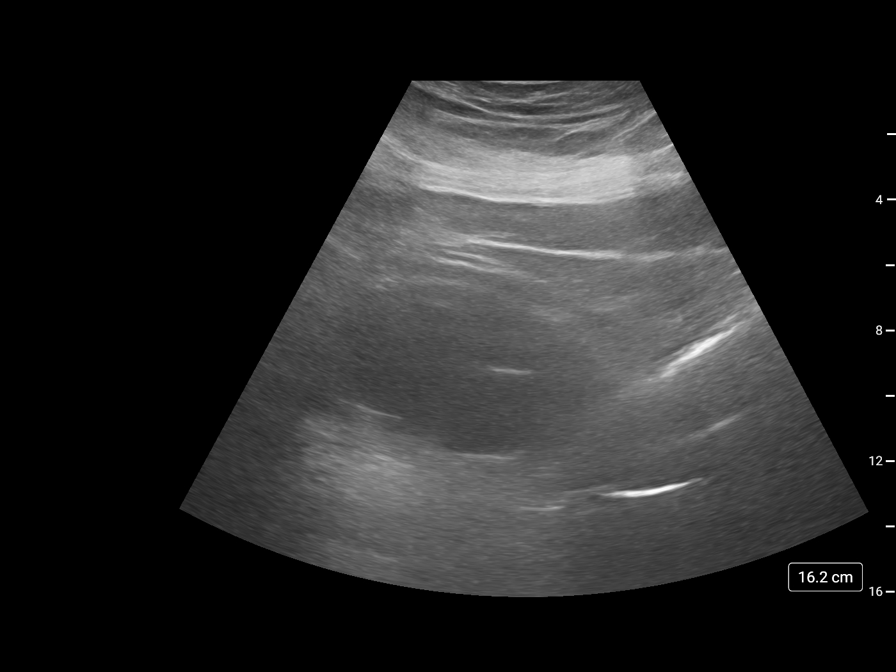
[im 4/5]
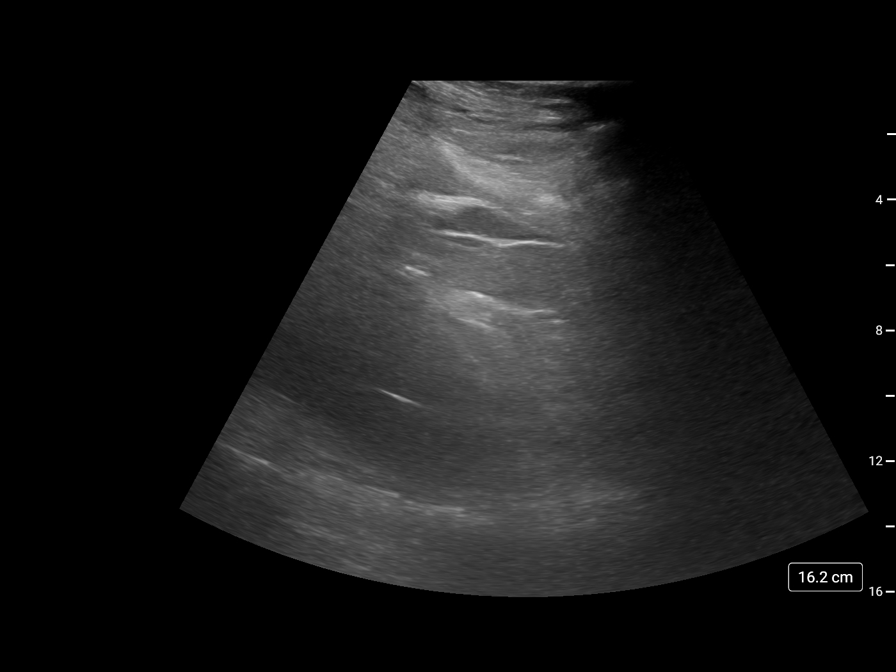
[im 5/5]
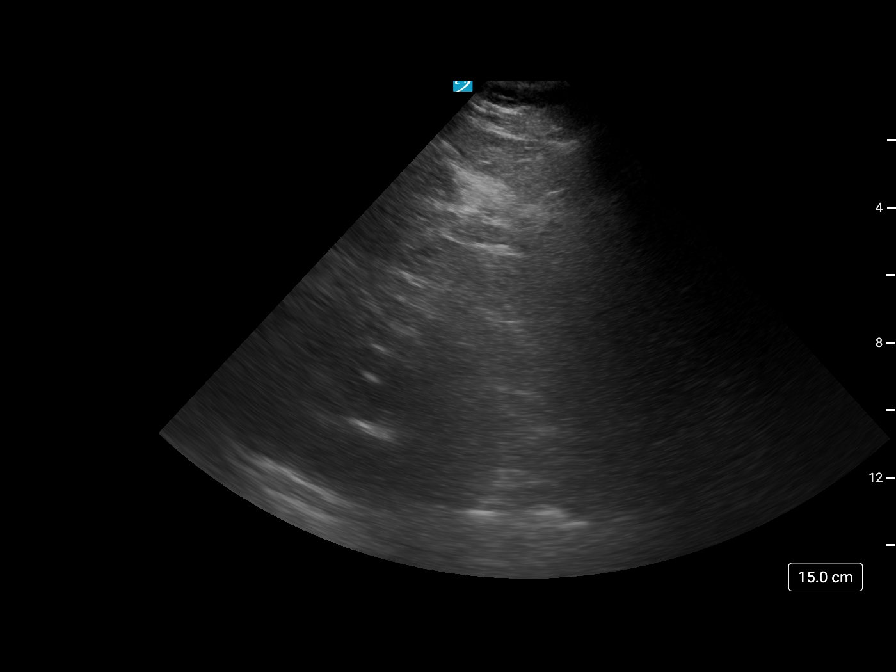

[Series 300: ir nephrostomy placement left · 2 of 2 slices shown]
[im 1/2]
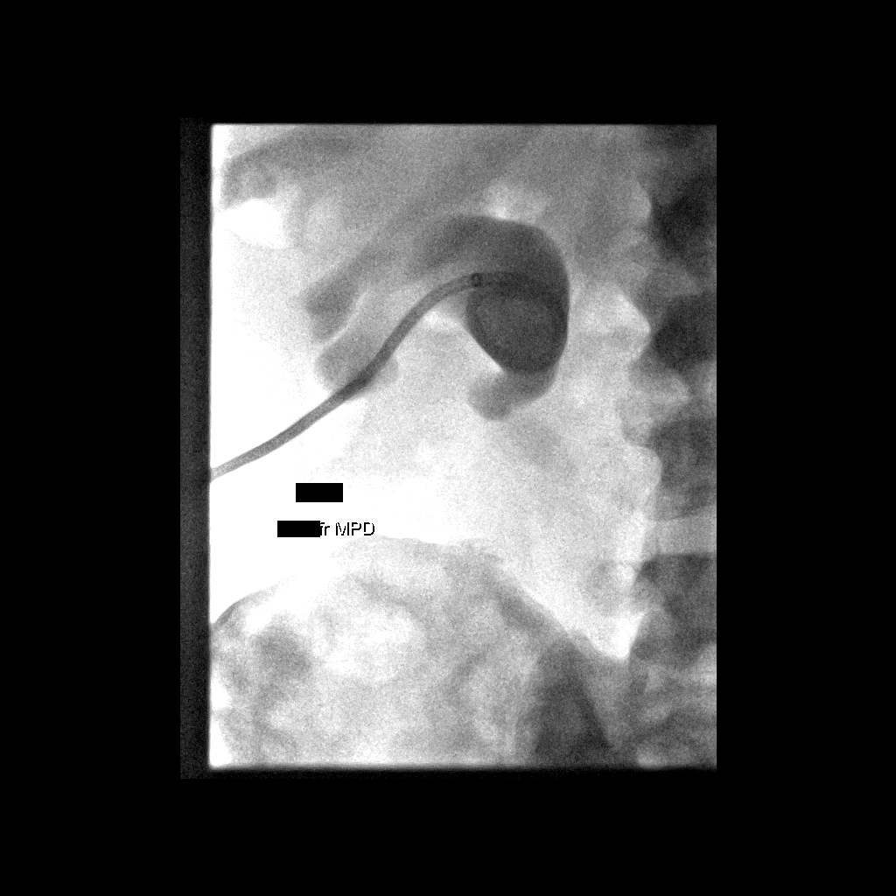
[im 2/2]
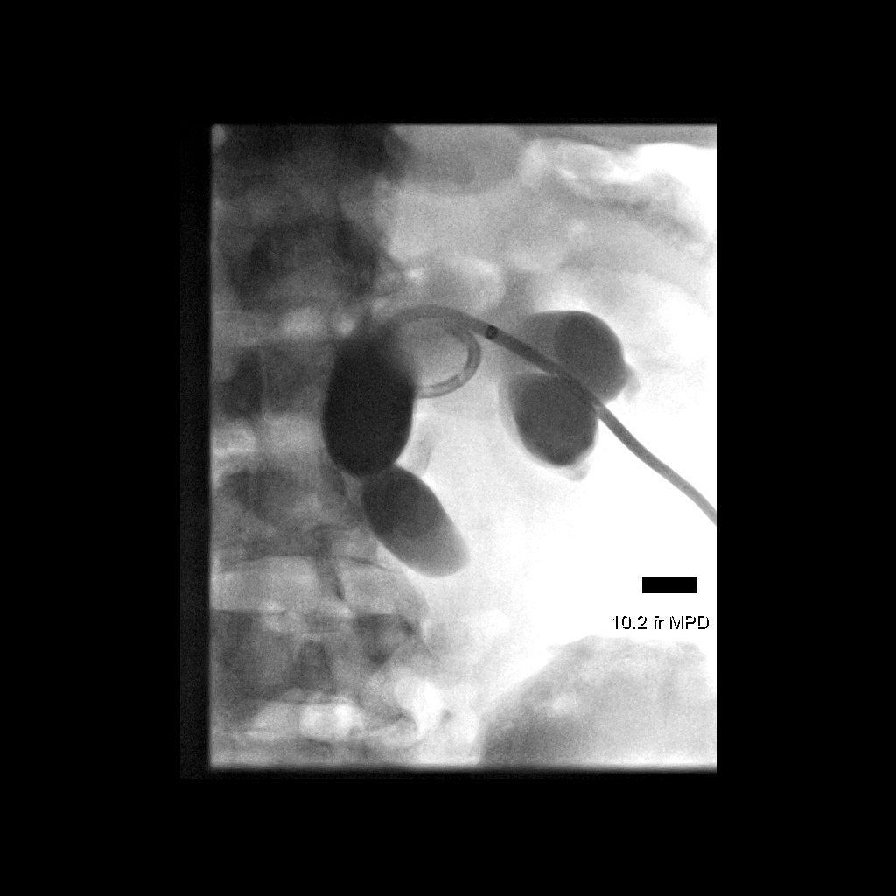

[7 of 7 positions shown; findings below may reference images not displayed]

A thoracentesis was also requested based off of CT imaging from
[DATE]. However, when the patient was evaluated with real-time
ultrasound, there is only trace pleural fluid which was insufficient
for drainage. Therefore, thoracentesis was deferred.

EXAM:
IR NEPHROSTOMY PLACEMENT LEFT; IR NEPHROSTOMY PLACEMENT RIGHT
MEDICATIONS:
Patient currently admitted as an inpatient receiving intravenous
antibiotics. No additional antibiotic prophylaxis was administered.

ANESTHESIA/SEDATION:
Fentanyl 100 mcg IV; Versed 2 mg IV

Moderate Sedation Time:  37 minutes

The patient was continuously monitored during the procedure by the
interventional radiology nurse under my direct supervision.

CONTRAST:  20 mL [NI]-administered into the collecting
system(s)

FLUOROSCOPY TIME:  Fluoroscopy Time: 2 minutes 12 seconds (22 mGy).

COMPLICATIONS:
None immediate.
The left flank was prepped with chlorhexidine in a sterile fashion,
and a sterile drape was applied covering the operative field. A
sterile gown and sterile gloves were used for the procedure. Local
anesthesia was provided with 1% Lidocaine.

The left flank was interrogated with ultrasound and the left kidney
identified. The kidney is hydronephrotic. A suitable access site on
the skin overlying the lower pole, posterior calix was identified.
After local mg anesthesia was achieved, a small skin nick was made
with an 11 blade scalpel. A 21 gauge Accustick needle was then
advanced under direct sonographic guidance into the lower pole of
the left kidney. A 0.018 inch wire was advanced under fluoroscopic
guidance into the left renal collecting system. The Accustick sheath
was then advanced over the wire and a 0.018 system exchanged for a
0.035 system. Gentle hand injection of contrast material confirms
placement of the sheath within the renal collecting system. There is
marked hydronephrosis. The tract from the scan into the renal
collecting system was then dilated serially to 10-French. A
10-BEEN was then placed and positioned
under fluoroscopic guidance. The locking loop is well formed within
the left renal pelvis. The catheter was secured to the skin with 2-0
Prolene and a sterile bandage was placed. Catheter was left to
gravity bag drainage.

Using the exact same technique, a 10.2 French percutaneous
nephrostomy catheter was placed in the right kidney via a posterior
lower pole calyx. Contrast injection into the renal collecting
system demonstrates marked hydronephrosis.

Both catheters were secured to the skin with 0 Prolene suture and
connected to gravity bag drainage. The patient tolerated the
procedure well.
IMPRESSION: Successful placement of a bilateral 10 French percutaneous
nephrostomy tubes.

PLAN:
1. Maintain tubes to gravity bag drainage.  Follow output.
2. Return Interventional Radiology in 8 weeks for bilateral
nephrostomy tube exchange.

## 2019-07-03 IMAGING — US IR NEPHROSTOMY PLACEMENT LEFT
2 series · 7 of 7 positions shown · non-contrast
Comparison: None.

INDICATION: 58-year-old male with metastatic prostate cancer, bilateral
obstructed hydronephrosis and acute kidney injury. He requires
placement of bilateral percutaneous nephrostomy tubes.
TECHNIQUE: The procedure, risks, benefits, and alternatives were explained to
the patient. Questions regarding the procedure were encouraged and
answered. The patient understands and consents to the procedure.

[Series 1: (id) · 5 of 5 slices shown]
[im 1/5]
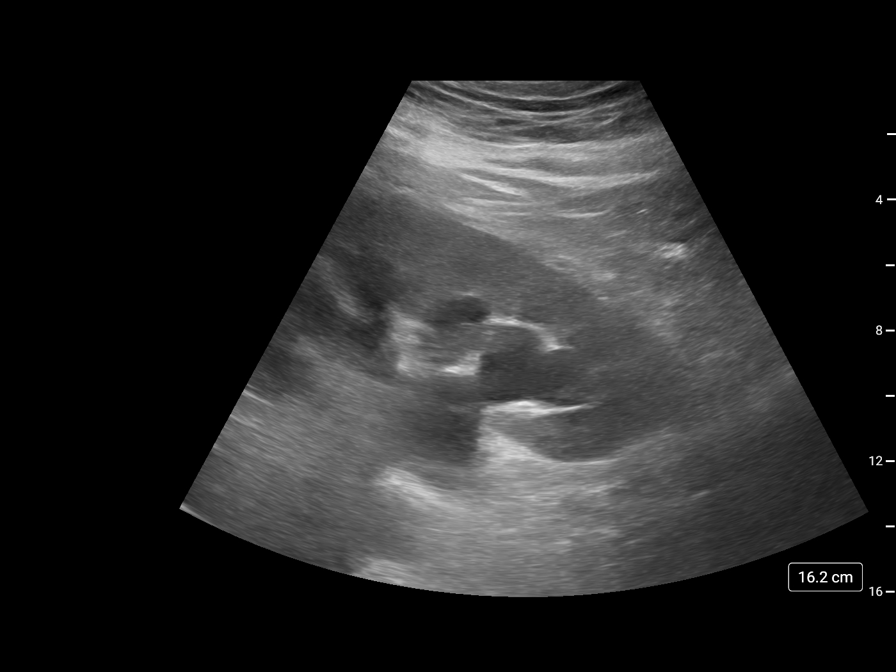
[im 2/5]
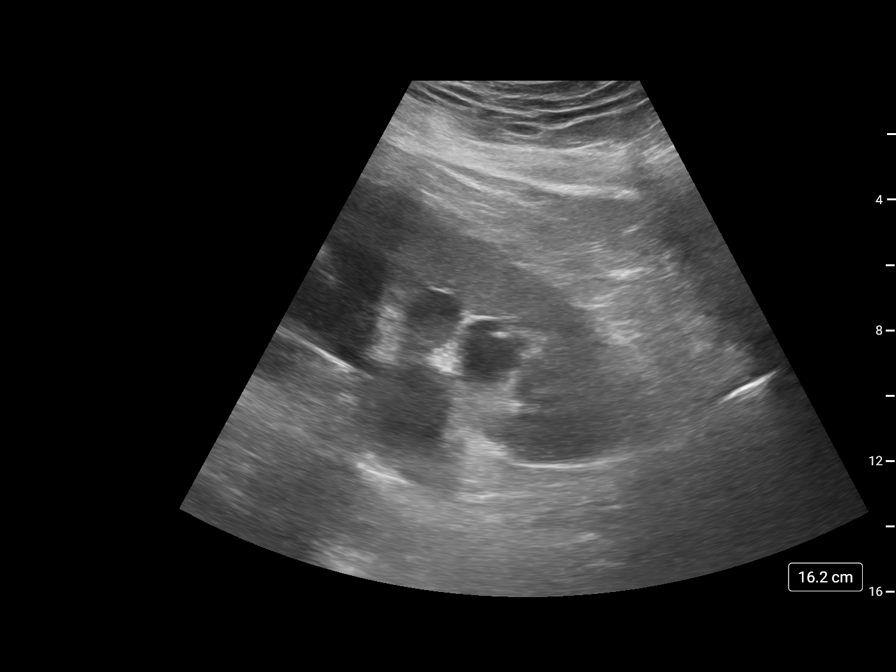
[im 3/5]
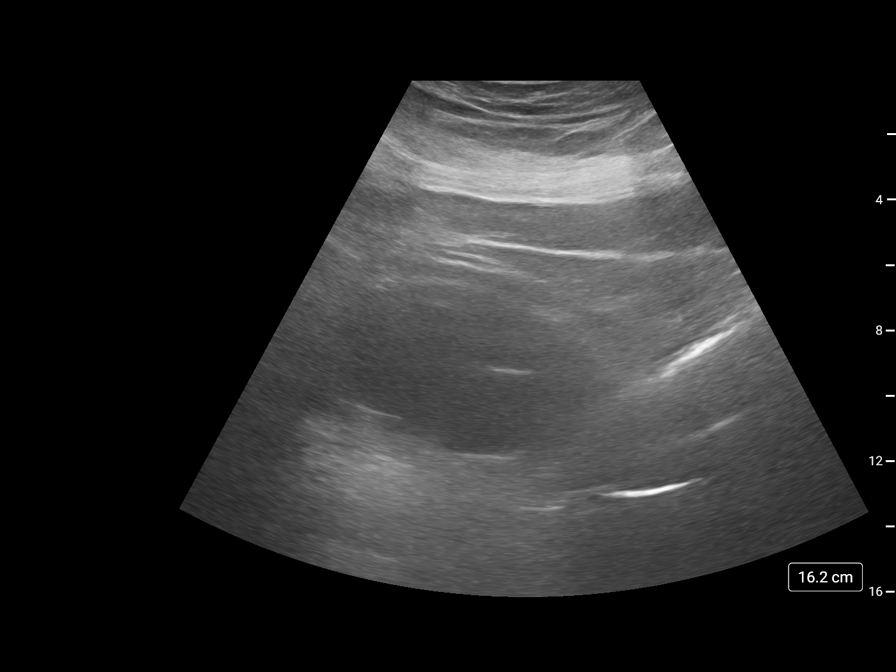
[im 4/5]
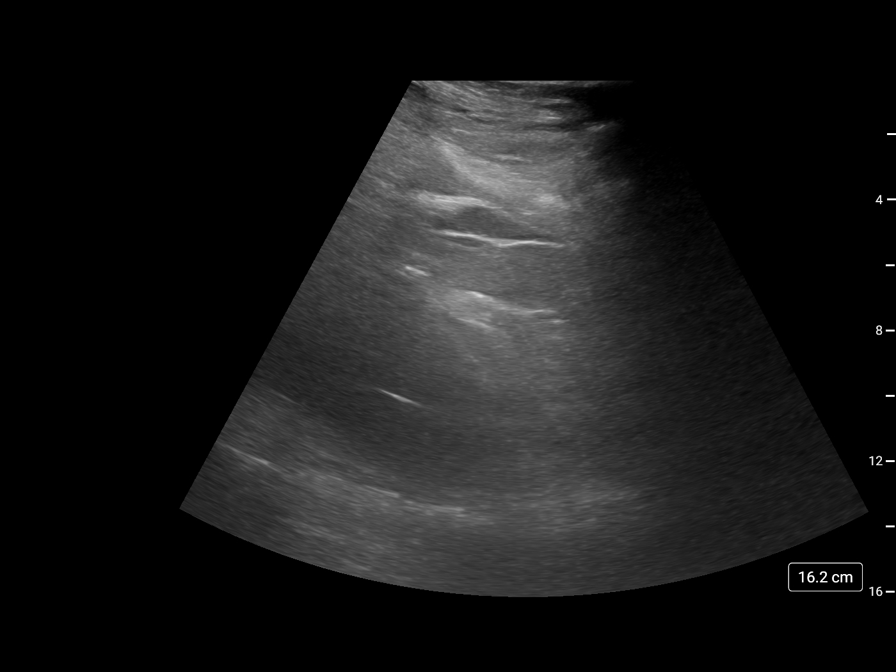
[im 5/5]
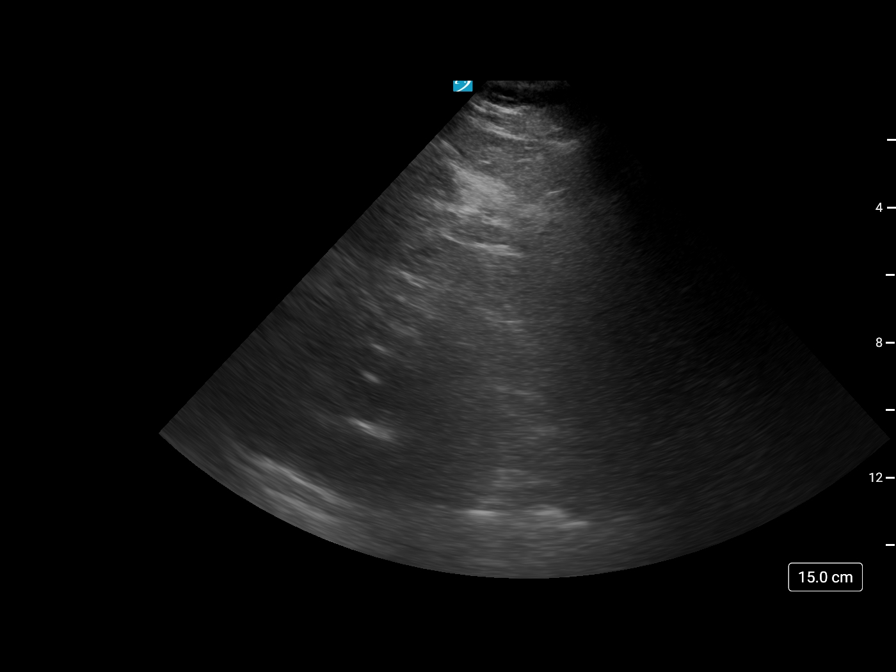

[Series 300: ir nephrostomy placement left · 2 of 2 slices shown]
[im 1/2]
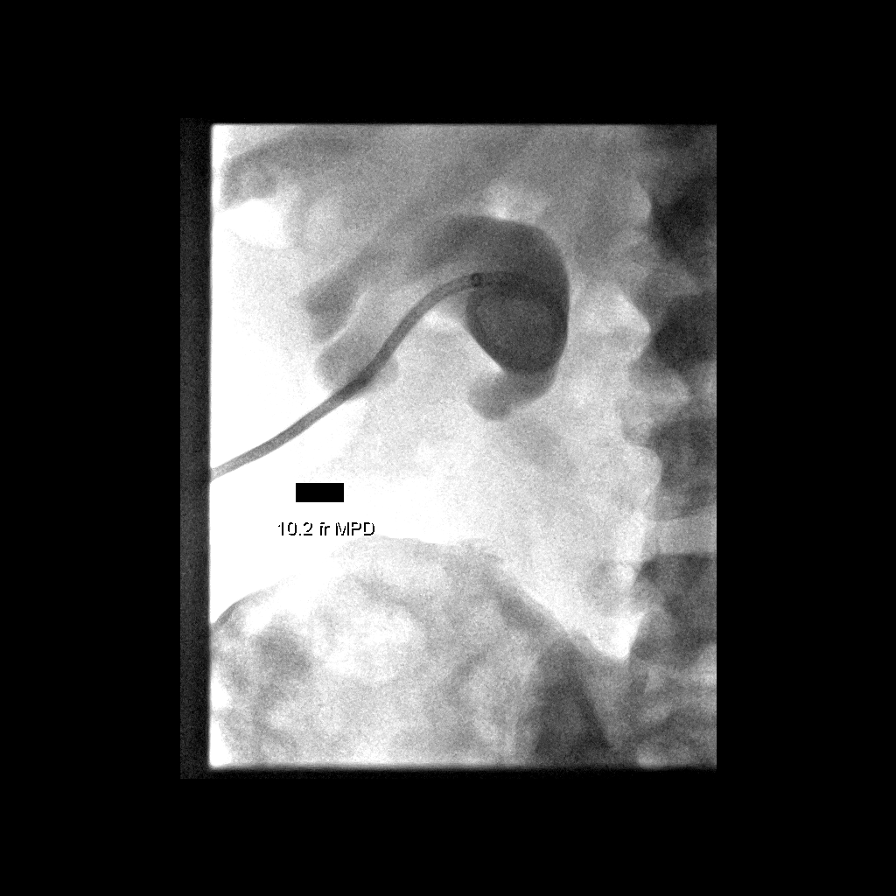
[im 2/2]
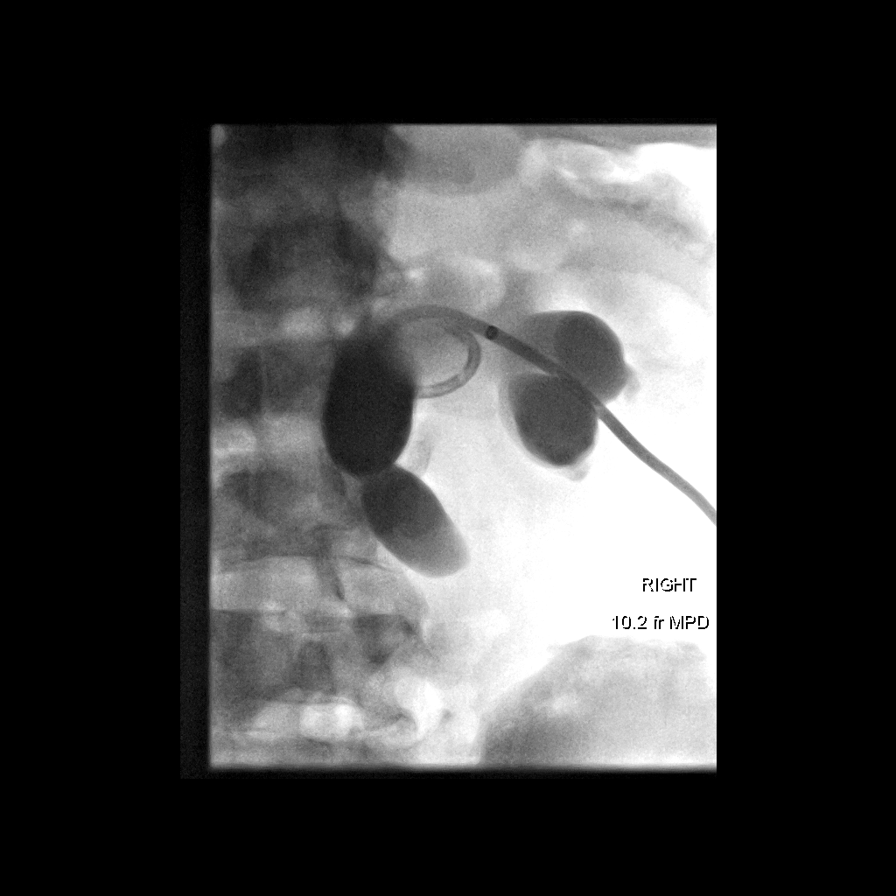

[7 of 7 positions shown; findings below may reference images not displayed]

A thoracentesis was also requested based off of CT imaging from
[DATE]. However, when the patient was evaluated with real-time
ultrasound, there is only trace pleural fluid which was insufficient
for drainage. Therefore, thoracentesis was deferred.

EXAM:
IR NEPHROSTOMY PLACEMENT LEFT; IR NEPHROSTOMY PLACEMENT RIGHT
MEDICATIONS:
Patient currently admitted as an inpatient receiving intravenous
antibiotics. No additional antibiotic prophylaxis was administered.

ANESTHESIA/SEDATION:
Fentanyl 100 mcg IV; Versed 2 mg IV

Moderate Sedation Time:  37 minutes

The patient was continuously monitored during the procedure by the
interventional radiology nurse under my direct supervision.

CONTRAST:  20 mL [NI]-administered into the collecting
system(s)

FLUOROSCOPY TIME:  Fluoroscopy Time: 2 minutes 12 seconds (22 mGy).

COMPLICATIONS:
None immediate.
The left flank was prepped with chlorhexidine in a sterile fashion,
and a sterile drape was applied covering the operative field. A
sterile gown and sterile gloves were used for the procedure. Local
anesthesia was provided with 1% Lidocaine.

The left flank was interrogated with ultrasound and the left kidney
identified. The kidney is hydronephrotic. A suitable access site on
the skin overlying the lower pole, posterior calix was identified.
After local mg anesthesia was achieved, a small skin nick was made
with an 11 blade scalpel. A 21 gauge Accustick needle was then
advanced under direct sonographic guidance into the lower pole of
the left kidney. A 0.018 inch wire was advanced under fluoroscopic
guidance into the left renal collecting system. The Accustick sheath
was then advanced over the wire and a 0.018 system exchanged for a
0.035 system. Gentle hand injection of contrast material confirms
placement of the sheath within the renal collecting system. There is
marked hydronephrosis. The tract from the scan into the renal
collecting system was then dilated serially to 10-French. A
10-BEEN was then placed and positioned
under fluoroscopic guidance. The locking loop is well formed within
the left renal pelvis. The catheter was secured to the skin with 2-0
Prolene and a sterile bandage was placed. Catheter was left to
gravity bag drainage.

Using the exact same technique, a 10.2 French percutaneous
nephrostomy catheter was placed in the right kidney via a posterior
lower pole calyx. Contrast injection into the renal collecting
system demonstrates marked hydronephrosis.

Both catheters were secured to the skin with 0 Prolene suture and
connected to gravity bag drainage. The patient tolerated the
procedure well.
IMPRESSION: Successful placement of a bilateral 10 French percutaneous
nephrostomy tubes.

PLAN:
1. Maintain tubes to gravity bag drainage.  Follow output.
2. Return Interventional Radiology in 8 weeks for bilateral
nephrostomy tube exchange.

## 2019-07-03 MED ORDER — FENTANYL CITRATE (PF) 100 MCG/2ML IJ SOLN
INTRAMUSCULAR | Status: AC | PRN
  Administered 2019-07-03: 50 ug via INTRAVENOUS
  Administered 2019-07-03 (×2): 25 ug via INTRAVENOUS

## 2019-07-03 MED ORDER — FENTANYL CITRATE (PF) 100 MCG/2ML IJ SOLN
INTRAMUSCULAR | Status: AC
  Filled 2019-07-03: qty 4

## 2019-07-03 MED ORDER — INSULIN ASPART 100 UNIT/ML ~~LOC~~ SOLN: 0.0000 [IU] | Freq: Every day | SUBCUTANEOUS | Status: DC

## 2019-07-03 MED ORDER — DEXTROSE 5 % IV SOLN: INTRAVENOUS | Status: DC

## 2019-07-03 MED ORDER — LIDOCAINE HCL 1 % IJ SOLN
INTRAMUSCULAR | Status: AC
  Filled 2019-07-03: qty 20

## 2019-07-03 MED ORDER — METOPROLOL TARTRATE 25 MG PO TABS: 25.0000 mg | ORAL_TABLET | Freq: Two times a day (BID) | ORAL | Status: DC

## 2019-07-03 MED ORDER — IOHEXOL 300 MG/ML  SOLN
50.0000 mL | Freq: Once | INTRAMUSCULAR | Status: AC | PRN
  Administered 2019-07-03: 10:00:00 10 mL

## 2019-07-03 MED ORDER — MIDAZOLAM HCL 2 MG/2ML IJ SOLN
INTRAMUSCULAR | Status: AC
  Filled 2019-07-03: qty 6

## 2019-07-03 MED ORDER — MIDAZOLAM HCL 2 MG/2ML IJ SOLN
INTRAMUSCULAR | Status: AC | PRN
  Administered 2019-07-03 (×2): 0.5 mg via INTRAVENOUS
  Administered 2019-07-03: 1 mg via INTRAVENOUS

## 2019-07-03 MED ORDER — DEXTROSE 5 % IV SOLN: INTRAVENOUS | Status: AC

## 2019-07-03 MED ORDER — INSULIN ASPART 100 UNIT/ML ~~LOC~~ SOLN
0.0000 [IU] | Freq: Three times a day (TID) | SUBCUTANEOUS | Status: DC
  Administered 2019-07-04: 10:00:00 2 [IU] via SUBCUTANEOUS
  Administered 2019-07-04 (×2): 3 [IU] via SUBCUTANEOUS

## 2019-07-03 MED ORDER — METOPROLOL TARTRATE 25 MG PO TABS
25.0000 mg | ORAL_TABLET | Freq: Two times a day (BID) | ORAL | Status: DC
  Administered 2019-07-03 – 2019-07-05 (×5): 25 mg via ORAL
  Filled 2019-07-03 (×5): qty 1

## 2019-07-03 NOTE — Progress Notes (Signed)
Updated the patient's mother via phone.  All questions answered.

## 2019-07-03 NOTE — Progress Notes (Signed)
Started on D5W @ 100 cc/hr for acute hypernatremia with serum sodium gradually rising, up to 163 this afternoon.  Suspect 2/2 to urinary losses, Net -16L with >4L UO recorded in the last 24H.    Will repeat BMP q2H until Na+ drops to 145.  Closely monitor serum sodium and serum glucose.  Goal sodium in the next 24H is no more than 12 points drop from 163.  Update on call MD of results to have IV fluid adjusted as needed.

## 2019-07-03 NOTE — Progress Notes (Signed)
CRITICAL VALUE ALERT  Critical Value:  Na 168  Date & Time Notied:  07/03/2019 @1806    Provider Notified:Dr. Nevada Crane @[id number]    Orders Received/Actions taken:  New orders received

## 2019-07-03 NOTE — Progress Notes (Signed)
PROGRESS NOTE  Jeffery [name] BZJ:[id number] DOB: 12-10-1960 DOA: 07/14/2019 PCP: [name], MD  HPI/Recap of past 24 hours: Patient is a 59 year old gentleman with PMH of stage IV prostate cancer with mets to bones, type II diabetes, polyneuropathy, hyperlipidemia, chronic kidney disease stage IIIB, anemia of chronic disease, chronic guaiac positive stools who presented to the emergency room with generalized weakness, hypoxia and tachycardia, painful urination.   He was working with therapies at home, he was found shaky and tremulous, oncology office was called who directed him to the ER.  Febrile tachycardic in the ED.  Hemoglobin 6.4.  Transfused 2 unit PRBCs.  Creatinine up to 3.54.   CT renal 3/10 showed invasive prostate cancer into the bladder with severe bilateral hydronephrosis.  A Foley catheter was inserted.  TRH was asked to admit.  Repeated imaging showed persistent severe bilateral hydronephrosis.  Urology and nephrology recommended bilateral nephrostomy tube placement by interventional radiology.  Patient has declined and has been reluctant to proceed.  Per oncology his cancer is not curable.  Per Nephrology, not a good candidate for hemodialysis.  Due to poor prognosis palliative care team was consulted and is following.  Hospital course complicated by intermittent high grade fevers, suspected hematuria and positive FOBT with Hg drop down to 7.1.  Transfused 1U PRBC on 3/14, Hg up to 8.8 post RBC transfusion. Dropped again and transfused 1U PRBC on 3/17.  Received IV lasix due to volume overload with acute hypoxia on 3/14-3/15.  Responded well with improvement of hypoxia. IV lasix dced on 3/15 due to rise in creatinine and soft BPs.  Restarted IV fluid on 3/16 by nephrology.  IV azithromycin and Rocephin were added on 06/30/2019 due to concern for possible pneumonia, Covered empirically for community-acquired pneumonia.  Repeated blood cultures x 2 have been negative to  date.  TEE completed on 07/09/2019 no evidence of vegetation/endocarditis.  Patient agreeable to proceed with nephrostomy tube placement and thoracentesis by IR planned on 07/03/2019.  07/03/19: Seen and examined at his bedside.  More lethargic and minimally interactive.  Sodium level rising up to 153.  We will start D5W at a low rate 30 cc/hr.      Assessment/Plan: Principal Problem:   Severe sepsis (HCC) Active Problems:   Urinary retention   AKI (acute kidney injury) ([place])   Cholelithiasis   Diabetes mellitus, new onset ([place])   DM2 (diabetes mellitus, type 2) (HCC)   CKD (chronic kidney disease) stage 3, GFR 30-59 ml/min   Prostate cancer (HCC)   GI bleed   Heme positive stool   Anemia due to chronic blood loss   Anemia   Enterococcal bacteremia   Palliative care by specialist   Goals of care, counseling/discussion   Metabolic encephalopathy   Pleural effusion  Sepsis, present on admission suspect from urinary source, Enterococcus bacteremia and possibly CAP, POA. Leukocytosis and neutrophilia have resolved. Still spikes fever intermittently. He is on ampicillin for Proteus Bacteremia Is also on day #4 of IV azithromycin/Rocephin to cover CAP. Repeated blood cultures x2 have been negative final. ID following  Severe bilateral hydronephrosis in the setting of obstruction from progressive enlarging prostate mass Foley catheter in place, severe bilateral hydronephrosis persists Plan for bilateral nephrostomy tube placement by IR today  Acute kidney injury on chronic kidney disease stage IIIB likely multifactorial postrenal and prerenal 2/2 to obstructive uropathy from prostate cancer and intravascular volume depletion:  Baseline creatinine appears to be 2.0 with GFR of 36 Presented with  creatinine up to 3.8 Creatinine  3.52>> 3.3>> 3.1>> 3.2>> 3.2. Started on D5W at 30 cc/h Continue to avoid nephrotoxins and hypotension Nephrology signed off. Continue Foley catheter  #9  Moderate bilateral pleural effusions Plan for thoracentesis today by interventional radiology Continue to monitor O2 saturation and maintain at greater than 92%  Enterococcus bacteremia 2/2 bottles Management as per above Repeated blood cultures negative final. TEE negative for endocarditis. Colonoscopy once the patient is hemodynamically stable. On ampicillin Followed by ID  Refractory sinus tachycardia likely multifactorial 2/2 to sepsis versus fever versus other ST on 12 lead EKG Heart rate in the 120s-130s P.o. metoprolol increased to 25 mg twice daily. Continue IV lopressor 2.5 mg q3H prn with HR>130 when MAP>70 Last TSH normal 1.9 on 05/27/2019 Bilateral lower extremity Doppler ultrasound negative for DVT on 06/28/2019  Acute hypoxic respiratory failure secondary to pulmonary edema/bilateral pleural effusion Not on oxygen supplementation at baseline Continue to maintain O2 saturation greater than 92% Encourage incentive spirometer to minimize fluid accumulation in the lungs.  Hypovolemic hypernatremia in the setting of poor oral intake Sodium continues to rise up to 153 Started D5W at 30 cc/h on 07/04/2019 Continue to monitor volume status and respiratory status  Resolved hypokalemia post repletion Potassium 3.9  Suspected CAP On azithromycin and Rocephin day number 4 out of 5 Procalcitonin down to 11 from 16  Severe protein calorie malnutrition Albumin 1.7 BMI 32 with loss of muscle mass Continue to encourage increase in protein calorie intake Dietitian following, appreciate recommendations.  Resolved prolonged QTC 12 lead EKG on 3/12 QTC >540 Repeated 12 lead EKG done on 3/14 QTc 353  Left lower extremity pain, DVT ruled out. Chronic left lower extremity weakness with worsening pain Bilateral Doppler ultrasound negative for DVT.  Severe physical debility Continue PT OT with assistance and fall precaution as tolerated Encourage increase protein calorie  intake and mobilize as tolerated  Hyperphosphatemia in the setting of acute renal failure Phosphorous 5.1 Deferred management to nephrology  Anemia of chronic disease, positive FOBT and mild hematuria:  Received 2 units of PRBC.  Hg 7.2>>7.6> 7.1>> 7.9>> 7.8 Transfused 1 unit PRBC on 06/27/2019 Transfused 1 U PRBC 06/30/19 Continue PPI Hemoglobin stable at 7.8.  Type 2 diabetes with hyperglycemia:  A1C 6.6 on 06/24/19.  Hold off Metformin, home med, may have to dc with poor renal function.   Continue sliding scale insulin.  Metastatic prostate cancer on Zytiga, bladder obstruction, debility and chronic anemia: Followed by his oncologist.  Will have outpatient follow-up after stabilization. Zytiga on hold  Goals of care Palliative care following  DVT prophylaxis: DVT prophylaxis with SCDs Code Status: Full code  Family Communication:  Updated the patient's mother via phone.  Disposition Plan: patient is from home. Anticipated DC to home once interventional radiology and infectious disease have signed off, Barriers to discharge active treatment, IV antibiotics, inpatient procedures planned with interventional radiology.   Consultants:   Urology  GI  Oncology  Palliative medicine  Nephrology  Cardiology  Dietitian  Interventional radiology  Infectious disease  Procedures:   Foley cath insertion     Objective: Vitals:   07/03/19 0855 07/03/19 0900 07/03/19 0905 07/03/19 0910  BP: 139/90 (!) 147/85 (!) 143/82 (!) 143/79  Pulse: (!) 122 (!) 124 (!) 125 (!) 128  Resp: (!) 22 (!) 22 (!) 21 (!) 22  Temp:      TempSrc:      SpO2: 96% 95% 96% 96%  Weight:  Height:        Intake/Output Summary (Last 24 hours) at 07/03/2019 0920 Last data filed at 07/03/2019 0700 Gross per 24 hour  Intake 1090.88 ml  Output 3900 ml  Net -2809.12 ml   Filed Weights   06/30/19 0500 06/23/2019 0500 07/03/19 0500  Weight: 102.9 kg 102.5 kg 99 kg     Exam:  . General: 59 y.o. year-old male well-developed well-nourished in no acute distress.  Somnolent and minimally interactive. . Cardiovascular: Tachycardic with no rubs or gallops.   [name] Respiratory: Mild rales at bases no wheezing noted.. . Abdomen: Obese nontender normal bowel sounds present.   . Musculoskeletal: No lower extremity edema bilaterally.   [name] Psychiatry: Unable to appreciate mood due to somnolence.   Data Reviewed: CBC: Recent Labs  Lab 06/29/19 0212 06/29/19 1054 06/30/19 0156 06/30/19 0839 07/01/19 0209 06/29/2019 0214 07/03/19 0142  WBC 10.0  --  8.5  --  7.0 7.8 8.4  NEUTROABS 7.4  --  6.4  --  5.0 5.5 5.8  HGB 7.7*   < > 6.9* 7.4* 7.4* 7.9* 7.8*  HCT 27.8*   < > 24.6* 26.2* 26.3* 27.3* 28.1*  MCV 93.3  --  92.8  --  92.9 92.5 94.9  PLT 206  --  159  --  156 196 173   < > = values in this interval not displayed.   Basic Metabolic Panel: Recent Labs  Lab 06/28/19 0316 06/28/19 0316 06/29/19 0212 06/29/19 1054 06/30/19 0156 07/01/19 0209 06/21/2019 0214 07/03/19 0142  NA 145   < > 148*  --  144 146* 150* 153*  K 3.6   < > 3.5  --  3.4* 3.4* 3.8 3.9  CL 110   < > 112*  --  112* 111 118* 117*  CO2 22   < > 24  --  23 24 24 26   GLUCOSE 124*   < > 129*  --  214* 214* 186* 182*  BUN 39*   < > 44*  --  52* 53* 51* 50*  CREATININE 3.36*   < > 3.52*  --  3.33* 3.10* 3.27* 3.20*  CALCIUM 9.1   < > 9.1  --  9.4 9.3 9.4 9.7  MG 2.0  --   --   --   --   --   --  2.1  PHOS  --   --   --  5.1*  --   --   --  4.2   < > = values in this interval not displayed.   GFR: Estimated Creatinine Clearance: 29.9 mL/min (A) (by C-G formula based on SCr of 3.2 mg/dL (H)). Liver Function Tests: Recent Labs  Lab 06/27/19 0317 07/01/19 0209  AST 40 35  ALT 26 21  ALKPHOS 158* 145*  BILITOT 0.8 0.3  PROT 5.9* 5.5*  ALBUMIN 1.7* 1.5*   No results for input(s): LIPASE, AMYLASE in the last 168 hours. No results for input(s): AMMONIA in the last 168  hours. Coagulation Profile: Recent Labs  Lab 07/03/19 0142  INR 1.3*   Cardiac Enzymes: No results for input(s): CKTOTAL, CKMB, CKMBINDEX, TROPONINI in the last 168 hours. BNP (last 3 results) No results for input(s): PROBNP in the last 8760 hours. HbA1C: No results for input(s): HGBA1C in the last 72 hours. CBG: Recent Labs  Lab 07/01/2019 1142 07/14/2019 1823 06/27/2019 2304 07/03/19 0537 07/03/19 0803  GLUCAP 153* 164* 180* 186* 156*   Lipid Profile: No results for input(s): CHOL,  HDL, LDLCALC, TRIG, CHOLHDL, LDLDIRECT in the last 72 hours. Thyroid Function Tests: No results for input(s): TSH, T4TOTAL, FREET4, T3FREE, THYROIDAB in the last 72 hours. Anemia Panel: No results for input(s): VITAMINB12, FOLATE, FERRITIN, TIBC, IRON, RETICCTPCT in the last 72 hours. Urine analysis:    Component Value Date/Time   COLORURINE YELLOW 07/12/2019 1537   APPEARANCEUR CLEAR 06/17/2019 1537   LABSPEC 1.006 06/15/2019 1537   PHURINE 6.0 06/19/2019 1537   GLUCOSEU NEGATIVE 07/11/2019 1537   HGBUR MODERATE (A) 07/12/2019 1537   BILIRUBINUR NEGATIVE 06/19/2019 1537   KETONESUR NEGATIVE 07/09/2019 1537   PROTEINUR 30 (A) 07/06/2019 1537   NITRITE NEGATIVE 07/07/2019 1537   LEUKOCYTESUR LARGE (A) 06/14/2019 1537   Sepsis Labs: @LABRCNTIP (procalcitonin:4,lacticidven:4)  ) Recent Results (from the past 240 hour(s))  Urine culture     Status: Abnormal   Collection Time: 07/01/2019  3:37 PM   Specimen: Urine, Random  Result Value Ref Range Status   Specimen Description   Final    URINE, RANDOM Performed at [facility], [place] [address] [place], [place] [address]    Special Requests   Final    NONE Performed at [facility], [place] [address] [place], [place] [address]    Culture MULTIPLE SPECIES PRESENT, SUGGEST RECOLLECTION (A)  Final   Report Status 06/24/2019 FINAL  Final  Culture, blood (routine x 2)     Status: Abnormal   Collection Time:  06/22/2019  4:56 PM   Specimen: BLOOD  Result Value Ref Range Status   Specimen Description   Final    BLOOD RIGHT ANTECUBITAL Performed at [facility] [address] [place], [place] [address]    Special Requests   Final    BOTTLES DRAWN AEROBIC ONLY Blood Culture results may not be optimal due to an excessive volume of blood received in culture bottles Performed at [facility] [address], [place], [place] [address]    Culture  Setup Time   Final    AEROBIC BOTTLE ONLY GRAM POSITIVE COCCI IN CHAINS CRITICAL RESULT CALLED TO, READ BACK BY AND VERIFIED WITH: Lavell Luster [facility] 06/28/2019 0032 JDW Performed at [facility], [place] [address], [place], [place] [address]    Culture ENTEROCOCCUS FAECALIS (A)  Final   Report Status 06/26/2019 FINAL  Final   Organism ID, Bacteria ENTEROCOCCUS FAECALIS  Final      Susceptibility   Enterococcus faecalis - MIC*    AMPICILLIN <=2 SENSITIVE Sensitive     VANCOMYCIN 1 SENSITIVE Sensitive     GENTAMICIN SYNERGY SENSITIVE Sensitive     * ENTEROCOCCUS FAECALIS  Blood Culture ID Panel (Reflexed)     Status: Abnormal   Collection Time: 07/05/2019  4:56 PM  Result Value Ref Range Status   Enterococcus species DETECTED (A) NOT DETECTED Final    Comment: CRITICAL RESULT CALLED TO, READ BACK BY AND VERIFIED WITH: J. GRIMSLEY PHARMD 06/15/2019 0032 JDW    Vancomycin resistance NOT DETECTED NOT DETECTED Final   Listeria monocytogenes NOT DETECTED NOT DETECTED Final   Staphylococcus species NOT DETECTED NOT DETECTED Final   Staphylococcus aureus (BCID) NOT DETECTED NOT DETECTED Final   Streptococcus species NOT DETECTED NOT DETECTED Final   Streptococcus agalactiae NOT DETECTED NOT DETECTED Final   Streptococcus pneumoniae NOT DETECTED NOT DETECTED Final   Streptococcus pyogenes NOT DETECTED NOT DETECTED Final   Acinetobacter baumannii NOT DETECTED NOT DETECTED Final   Enterobacteriaceae species NOT DETECTED NOT  DETECTED Final   Enterobacter cloacae complex NOT DETECTED NOT DETECTED Final   Escherichia coli NOT DETECTED NOT DETECTED Final   Klebsiella oxytoca NOT DETECTED NOT DETECTED Final   Klebsiella pneumoniae NOT DETECTED NOT DETECTED Final   Proteus species NOT DETECTED NOT DETECTED Final   Serratia marcescens NOT DETECTED NOT DETECTED Final   Haemophilus influenzae NOT DETECTED NOT DETECTED Final   Neisseria meningitidis NOT DETECTED NOT DETECTED Final   Pseudomonas aeruginosa NOT DETECTED NOT DETECTED Final   Candida albicans NOT DETECTED NOT DETECTED Final   Candida glabrata NOT DETECTED NOT DETECTED Final   Candida krusei NOT DETECTED NOT DETECTED Final   Candida parapsilosis NOT DETECTED NOT DETECTED Final   Candida tropicalis NOT DETECTED NOT DETECTED Final    Comment: Performed at [facility], [place] [address] [place], [place] [address]  Culture, blood (routine x 2)     Status: Abnormal   Collection Time: 06/16/2019  5:16 PM   Specimen: BLOOD  Result Value Ref Range Status   Specimen Description   Final    BLOOD RIGHT ANTECUBITAL Performed at [facility] [address] [place], [place] [address]    Special Requests   Final    BOTTLES DRAWN AEROBIC AND ANAEROBIC Blood Culture adequate volume Performed at [facility] [address] [place], [place] [address]    Culture  Setup Time   Final    GRAM POSITIVE COCCI IN CHAINS IN BOTH AEROBIC AND ANAEROBIC BOTTLES CRITICAL RESULT CALLED TO, READ BACK BY AND VERIFIED WITH: PHARMD M RENZ [id number] MLM    Culture (A)  Final    ENTEROCOCCUS FAECALIS SUSCEPTIBILITIES PERFORMED ON PREVIOUS CULTURE WITHIN THE LAST 5 DAYS. Performed at [facility], [place] [address] [place], [place] [address]    Report Status 06/26/2019 FINAL  Final  SARS CORONAVIRUS 2 (TAT 6-24 HRS) Nasopharyngeal Nasopharyngeal Swab     Status: None   Collection Time: 07/10/2019  5:20 PM   Specimen: Nasopharyngeal  Swab  Result Value Ref Range Status   SARS Coronavirus 2 NEGATIVE NEGATIVE Final    Comment: (NOTE) SARS-CoV-2 target nucleic acids are NOT DETECTED. The SARS-CoV-2 RNA is generally detectable in upper and lower respiratory specimens during the acute phase of infection. Negative results do not preclude SARS-CoV-2 infection, do not rule out co-infections with other pathogens, and should not be used as the sole basis for treatment or other patient management decisions. Negative results must be combined with clinical observations, patient history, and epidemiological information. The expected result is Negative. Fact Sheet for Patients: SugarRoll.be Fact Sheet for Healthcare Providers: https://www.woods-mathews.com/ This test is not yet approved or cleared by the [place] FDA and  has been authorized for detection and/or diagnosis of SARS-CoV-2 by FDA under an Emergency Use Authorization (EUA). This EUA will remain  in effect (meaning this test can be used) for the duration of the COVID-19 declaration under Section 56 4(b)(1) of the Act, 21 U.S.C. section 360bbb-3(b)(1), unless the authorization is terminated or revoked sooner. Performed at [facility], [place] [address], [place], [place] [address]   MRSA PCR Screening     Status: None   Collection Time: 06/24/19  1:08 AM   Specimen: Nasal Mucosa; Nasopharyngeal  Result Value Ref Range Status   MRSA by PCR NEGATIVE NEGATIVE Final    Comment:        The GeneXpert MRSA Assay (FDA approved for NASAL specimens only), is one component  of a comprehensive MRSA colonization surveillance program. It is not intended to diagnose MRSA infection nor to guide or monitor treatment for MRSA infections. Performed at [facility], [place] [address] [place], [place] [address]   Culture, blood (routine x 2)     Status: None   Collection Time: 06/26/19  2:10 AM   Specimen: BLOOD   Result Value Ref Range Status   Specimen Description   Final    BLOOD RIGHT ARM Performed at [place] [address] [place], [place]  [address]    Special Requests   Final    BOTTLES DRAWN AEROBIC ONLY Blood Culture adequate volume Performed at [facility] [address], [place], [place] [address]    Culture   Final    NO GROWTH 5 DAYS Performed at [facility], [place] [address], [place], [place] [address]    Report Status 07/01/2019 FINAL  Final  Culture, blood (routine x 2)     Status: None   Collection Time: 06/26/19  2:10 AM   Specimen: BLOOD  Result Value Ref Range Status   Specimen Description   Final    BLOOD RIGHT HAND Performed at [facility] [address], [place], [place] [address]    Special Requests   Final    BOTTLES DRAWN AEROBIC ONLY Blood Culture adequate volume Performed at [facility] [address], [place], [place] [address]    Culture   Final    NO GROWTH 5 DAYS Performed at [facility], [place] [address] [place],  [address]    Report Status 07/01/2019 FINAL  Final      Studies: ECHO TEE  Result Date: 06/21/2019    TRANSESOPHOGEAL ECHO REPORT   Patient Name:   Jeffery [name] Date of Exam: 06/15/2019 Medical Rec #:  [id number]           Height:       69.0 in Accession #:    [id number]          Weight:       226.0 lb Date of Birth:  1960-12-31           BSA:          2.176 m Patient Age:    7 years            BP:           132/75 mmHg Patient Gender: M                   HR:           114 bpm. Exam Location:  Inpatient Procedure: Transesophageal Echo Indications:     Bacteremia  History:         Patient has prior history of Echocardiogram examinations, most                  recent 07/01/2019. Risk Factors:Diabetes, Hypertension and                  Former Smoker. CKD.  Sonographer:     Clayton Lefort RDCS (AE) Referring Phys:  [id number] Abigail Butts  Diagnosing Phys: Buford Dresser MD PROCEDURE: After discussion of the risks and benefits of a TEE, an informed consent was obtained from the patient. The transesophogeal probe was passed without difficulty through the esophogus of the patient. Sedation performed by different physician. The patient was monitored while under deep sedation.  Anesthestetic sedation was provided intravenously by Anesthesiology: 200.4mg  of Propofol. Image quality was adequate. The patient developed no complications during the procedure. IMPRESSIONS  1. Left ventricular ejection fraction, by estimation, is 50 to 55%. The left ventricle has low normal function. The left ventricle has no regional wall motion abnormalities. Left ventricular diastolic function could not be evaluated.  2. Right ventricular systolic function is normal. The right ventricular size is normal. There is normal pulmonary artery systolic pressure.  3. No left atrial/left atrial appendage thrombus was detected.  4. The mitral valve is normal in structure. Trivial mitral valve regurgitation. No evidence of mitral stenosis.  5. The aortic valve is tricuspid. Aortic valve regurgitation is not visualized. No aortic stenosis is present.  6. There is mild (Grade II) plaque involving the descending aorta. Conclusion(s)/Recommendation(s): No evidence of vegetation/infective endocarditis on this transesophageal echocardiogram. FINDINGS  Left Ventricle: Left ventricular ejection fraction, by estimation, is 50 to 55%. The left ventricle has low normal function. The left ventricle has no regional wall motion abnormalities. The left ventricular internal cavity size was normal in size. There is no left ventricular hypertrophy. Left ventricular diastolic function could not be evaluated. Right Ventricle: The right ventricular size is normal. No increase in right ventricular wall thickness. Right ventricular systolic function is normal. There is normal pulmonary artery systolic  pressure. The tricuspid regurgitant velocity is 1.86 m/s, and  with an assumed right atrial pressure of 8 mmHg, the estimated right ventricular systolic pressure is 34.1 mmHg. Left Atrium: Left atrial size was not assessed. No left atrial/left atrial appendage thrombus was detected. Right Atrium: Right atrial size was not assessed. Pericardium: Trivial pericardial effusion is present. Mitral Valve: The mitral valve is normal in structure. Trivial mitral valve regurgitation. No evidence of mitral valve stenosis. There is no evidence of mitral valve vegetation. Tricuspid Valve: The tricuspid valve is normal in structure. Tricuspid valve regurgitation is trivial. No evidence of tricuspid stenosis. There is no evidence of tricuspid valve vegetation. Aortic Valve: The aortic valve is tricuspid. Aortic valve regurgitation is not visualized. No aortic stenosis is present. There is no evidence of aortic valve vegetation. Pulmonic Valve: The pulmonic valve was normal in structure. Pulmonic valve regurgitation is not visualized. No evidence of pulmonic stenosis. There is no evidence of pulmonic valve vegetation. Aorta: The aortic root is normal in size and structure. There is mild (Grade II) plaque involving the descending aorta. IAS/Shunts: No atrial level shunt detected by color flow Doppler. There is no evidence of a patent foramen ovale.  TRICUSPID VALVE TR Peak grad:   13.8 mmHg TR Vmax:        186.00 cm/s Buford Dresser MD Electronically signed by Buford Dresser MD Signature Date/Time: 07/12/2019/2:08:54 PM    Final     Scheduled Meds: . amLODipine  5 mg Oral Daily  . Chlorhexidine Gluconate Cloth  6 each Topical Daily  . feeding supplement (ENSURE ENLIVE)  237 mL Oral TID BM  . feeding supplement (PRO-STAT SUGAR FREE 64)  30 mL Oral BID  . fentaNYL      . gabapentin  100 mg Oral BID  . insulin aspart  0-9 Units Subcutaneous Q6H  . lidocaine      . mouth rinse  15 mL Mouth Rinse BID  .  metoprolol tartrate  25 mg Oral BID  . midazolam      . multivitamin with minerals  1 tablet Oral Daily  . pantoprazole  40 mg Oral Q0600  . protein  supplement  1 Scoop Oral TID WC    Continuous Infusions: . ampicillin (OMNIPEN) IV Stopped (07/03/19 0355)  . azithromycin Stopped (07/10/2019 9741)  . cefTRIAXone (ROCEPHIN)  IV 2 g (07/03/19 0842)  . dextrose 30 mL/hr at 07/03/19 0801     LOS: 10 days     Kayleen Memos, MD [facility] Pager [phone]  If 7PM-7AM, please contact night-coverage [email or web address] Password [email or web address] 07/03/2019, 9:20 AM

## 2019-07-03 NOTE — Progress Notes (Signed)
CRITICAL VALUE ALERT  Critical Value:  Na 162  Date & Time Notied:  3/20; 20:02  Provider Notified: Mansy MD  Orders Received/Actions taken: No new orders, notify when Na=165. Will continue to monitor the patient.

## 2019-07-03 NOTE — Procedures (Signed)
Interventional Radiology Procedure Note  Procedure: Bilateral PCN placement, 10.1F.  Attempted thoracentesis, but no significant fluid.   Complications: None  Estimated Blood Loss: None  Recommendations: - Tubes to gravity bags - Return to IR in 8 weeks for tube check/change  Signed,  Criselda Peaches, MD

## 2019-07-04 LAB — BASIC METABOLIC PANEL
Anion gap: 10 (ref 5–15)
Anion gap: 10 (ref 5–15)
Anion gap: 10 (ref 5–15)
Anion gap: 11 (ref 5–15)
Anion gap: 11 (ref 5–15)
Anion gap: 14 (ref 5–15)
Anion gap: 13 (ref 5–15)
Anion gap: 13 (ref 5–15)
Anion gap: 14 (ref 5–15)
Anion gap: 11 (ref 5–15)
Anion gap: 10 (ref 5–15)
BUN: 60 mg/dL — ABNORMAL HIGH (ref 6–20)
BUN: 59 mg/dL — ABNORMAL HIGH (ref 6–20)
BUN: 60 mg/dL — ABNORMAL HIGH (ref 6–20)
BUN: 58 mg/dL — ABNORMAL HIGH (ref 6–20)
BUN: 59 mg/dL — ABNORMAL HIGH (ref 6–20)
BUN: 59 mg/dL — ABNORMAL HIGH (ref 6–20)
BUN: 58 mg/dL — ABNORMAL HIGH (ref 6–20)
BUN: 57 mg/dL — ABNORMAL HIGH (ref 6–20)
BUN: 56 mg/dL — ABNORMAL HIGH (ref 6–20)
BUN: 55 mg/dL — ABNORMAL HIGH (ref 6–20)
BUN: 52 mg/dL — ABNORMAL HIGH (ref 6–20)
CO2: 27 mmol/L (ref 22–32)
CO2: 28 mmol/L (ref 22–32)
CO2: 29 mmol/L (ref 22–32)
CO2: 28 mmol/L (ref 22–32)
CO2: 29 mmol/L (ref 22–32)
CO2: 29 mmol/L (ref 22–32)
CO2: 27 mmol/L (ref 22–32)
CO2: 27 mmol/L (ref 22–32)
CO2: 26 mmol/L (ref 22–32)
CO2: 27 mmol/L (ref 22–32)
CO2: 27 mmol/L (ref 22–32)
Calcium: 10 mg/dL (ref 8.9–10.3)
Calcium: 10.1 mg/dL (ref 8.9–10.3)
Calcium: 10.2 mg/dL (ref 8.9–10.3)
Calcium: 10.3 mg/dL (ref 8.9–10.3)
Calcium: 10.3 mg/dL (ref 8.9–10.3)
Calcium: 10.4 mg/dL — ABNORMAL HIGH (ref 8.9–10.3)
Calcium: 10.3 mg/dL (ref 8.9–10.3)
Calcium: 10.3 mg/dL (ref 8.9–10.3)
Calcium: 10.2 mg/dL (ref 8.9–10.3)
Calcium: 10.5 mg/dL — ABNORMAL HIGH (ref 8.9–10.3)
Calcium: 10.4 mg/dL — ABNORMAL HIGH (ref 8.9–10.3)
Chloride: 125 mmol/L — ABNORMAL HIGH (ref 98–111)
Chloride: 125 mmol/L — ABNORMAL HIGH (ref 98–111)
Chloride: 125 mmol/L — ABNORMAL HIGH (ref 98–111)
Chloride: 124 mmol/L — ABNORMAL HIGH (ref 98–111)
Chloride: 126 mmol/L — ABNORMAL HIGH (ref 98–111)
Chloride: 124 mmol/L — ABNORMAL HIGH (ref 98–111)
Chloride: 124 mmol/L — ABNORMAL HIGH (ref 98–111)
Chloride: 124 mmol/L — ABNORMAL HIGH (ref 98–111)
Chloride: 122 mmol/L — ABNORMAL HIGH (ref 98–111)
Chloride: 122 mmol/L — ABNORMAL HIGH (ref 98–111)
Chloride: 120 mmol/L — ABNORMAL HIGH (ref 98–111)
Creatinine, Ser: 3.26 mg/dL — ABNORMAL HIGH (ref 0.61–1.24)
Creatinine, Ser: 3.28 mg/dL — ABNORMAL HIGH (ref 0.61–1.24)
Creatinine, Ser: 3.26 mg/dL — ABNORMAL HIGH (ref 0.61–1.24)
Creatinine, Ser: 3.28 mg/dL — ABNORMAL HIGH (ref 0.61–1.24)
Creatinine, Ser: 3.42 mg/dL — ABNORMAL HIGH (ref 0.61–1.24)
Creatinine, Ser: 3.2 mg/dL — ABNORMAL HIGH (ref 0.61–1.24)
Creatinine, Ser: 3.15 mg/dL — ABNORMAL HIGH (ref 0.61–1.24)
Creatinine, Ser: 3.15 mg/dL — ABNORMAL HIGH (ref 0.61–1.24)
Creatinine, Ser: 3.09 mg/dL — ABNORMAL HIGH (ref 0.61–1.24)
Creatinine, Ser: 2.92 mg/dL — ABNORMAL HIGH (ref 0.61–1.24)
Creatinine, Ser: 2.84 mg/dL — ABNORMAL HIGH (ref 0.61–1.24)
GFR calc Af Amer: 23 mL/min — ABNORMAL LOW (ref >60)
GFR calc Af Amer: 23 mL/min — ABNORMAL LOW (ref >60)
GFR calc Af Amer: 23 mL/min — ABNORMAL LOW (ref >60)
GFR calc Af Amer: 23 mL/min — ABNORMAL LOW (ref >60)
GFR calc Af Amer: 22 mL/min — ABNORMAL LOW (ref >60)
GFR calc Af Amer: 24 mL/min — ABNORMAL LOW (ref >60)
GFR calc Af Amer: 24 mL/min — ABNORMAL LOW (ref >60)
GFR calc Af Amer: 24 mL/min — ABNORMAL LOW (ref >60)
GFR calc Af Amer: 25 mL/min — ABNORMAL LOW (ref >60)
GFR calc Af Amer: 27 mL/min — ABNORMAL LOW (ref >60)
GFR calc Af Amer: 27 mL/min — ABNORMAL LOW (ref >60)
GFR calc non Af Amer: 20 mL/min — ABNORMAL LOW (ref >60)
GFR calc non Af Amer: 20 mL/min — ABNORMAL LOW (ref >60)
GFR calc non Af Amer: 20 mL/min — ABNORMAL LOW (ref >60)
GFR calc non Af Amer: 20 mL/min — ABNORMAL LOW (ref >60)
GFR calc non Af Amer: 19 mL/min — ABNORMAL LOW (ref >60)
GFR calc non Af Amer: 21 mL/min — ABNORMAL LOW (ref >60)
GFR calc non Af Amer: 21 mL/min — ABNORMAL LOW (ref >60)
GFR calc non Af Amer: 21 mL/min — ABNORMAL LOW (ref >60)
GFR calc non Af Amer: 21 mL/min — ABNORMAL LOW (ref >60)
GFR calc non Af Amer: 23 mL/min — ABNORMAL LOW (ref >60)
GFR calc non Af Amer: 24 mL/min — ABNORMAL LOW (ref >60)
Glucose, Bld: 240 mg/dL — ABNORMAL HIGH (ref 70–99)
Glucose, Bld: 241 mg/dL — ABNORMAL HIGH (ref 70–99)
Glucose, Bld: 245 mg/dL — ABNORMAL HIGH (ref 70–99)
Glucose, Bld: 243 mg/dL — ABNORMAL HIGH (ref 70–99)
Glucose, Bld: 233 mg/dL — ABNORMAL HIGH (ref 70–99)
Glucose, Bld: 224 mg/dL — ABNORMAL HIGH (ref 70–99)
Glucose, Bld: 330 mg/dL — ABNORMAL HIGH (ref 70–99)
Glucose, Bld: 273 mg/dL — ABNORMAL HIGH (ref 70–99)
Glucose, Bld: 284 mg/dL — ABNORMAL HIGH (ref 70–99)
Glucose, Bld: 195 mg/dL — ABNORMAL HIGH (ref 70–99)
Glucose, Bld: 223 mg/dL — ABNORMAL HIGH (ref 70–99)
Potassium: 4.1 mmol/L (ref 3.5–5.1)
Potassium: 4 mmol/L (ref 3.5–5.1)
Potassium: 4 mmol/L (ref 3.5–5.1)
Potassium: 4 mmol/L (ref 3.5–5.1)
Potassium: 3.9 mmol/L (ref 3.5–5.1)
Potassium: 3.8 mmol/L (ref 3.5–5.1)
Potassium: 3.8 mmol/L (ref 3.5–5.1)
Potassium: 3.7 mmol/L (ref 3.5–5.1)
Potassium: 3.6 mmol/L (ref 3.5–5.1)
Potassium: 3.7 mmol/L (ref 3.5–5.1)
Potassium: 3.5 mmol/L (ref 3.5–5.1)
Sodium: 162 mmol/L (ref 135–145)
Sodium: 163 mmol/L (ref 135–145)
Sodium: 164 mmol/L (ref 135–145)
Sodium: 163 mmol/L (ref 135–145)
Sodium: 166 mmol/L (ref 135–145)
Sodium: 167 mmol/L (ref 135–145)
Sodium: 164 mmol/L (ref 135–145)
Sodium: 164 mmol/L (ref 135–145)
Sodium: 162 mmol/L (ref 135–145)
Sodium: 160 mmol/L — ABNORMAL HIGH (ref 135–145)
Sodium: 157 mmol/L — ABNORMAL HIGH (ref 135–145)

## 2019-07-04 LAB — CBC WITH DIFFERENTIAL/PLATELET
Abs Immature Granulocytes: 0.66 10*3/uL — ABNORMAL HIGH (ref 0.00–0.07)
Basophils Absolute: 0 10*3/uL (ref 0.0–0.1)
Basophils Relative: 1 %
Eosinophils Absolute: 0.1 10*3/uL (ref 0.0–0.5)
Eosinophils Relative: 1 %
HCT: 28.4 % — ABNORMAL LOW (ref 39.0–52.0)
Hemoglobin: 7.6 g/dL — ABNORMAL LOW (ref 13.0–17.0)
Immature Granulocytes: 8 %
Lymphocytes Relative: 17 %
Lymphs Abs: 1.4 10*3/uL (ref 0.7–4.0)
MCH: 25.7 pg — ABNORMAL LOW (ref 26.0–34.0)
MCHC: 26.8 g/dL — ABNORMAL LOW (ref 30.0–36.0)
MCV: 95.9 fL (ref 80.0–100.0)
Monocytes Absolute: 0.5 10*3/uL (ref 0.1–1.0)
Monocytes Relative: 6 %
Neutro Abs: 5.5 10*3/uL (ref 1.7–7.7)
Neutrophils Relative %: 67 %
Platelets: 153 10*3/uL (ref 150–400)
RBC: 2.96 MIL/uL — ABNORMAL LOW (ref 4.22–5.81)
RDW: 18.8 % — ABNORMAL HIGH (ref 11.5–15.5)
WBC: 8.2 10*3/uL (ref 4.0–10.5)
nRBC: 0 % (ref 0.0–0.2)

## 2019-07-04 LAB — GLUCOSE, CAPILLARY
Glucose-Capillary: 194 mg/dL — ABNORMAL HIGH (ref 70–99)
Glucose-Capillary: 232 mg/dL — ABNORMAL HIGH (ref 70–99)
Glucose-Capillary: 223 mg/dL — ABNORMAL HIGH (ref 70–99)
Glucose-Capillary: 263 mg/dL — ABNORMAL HIGH (ref 70–99)
Glucose-Capillary: 231 mg/dL — ABNORMAL HIGH (ref 70–99)
Glucose-Capillary: 172 mg/dL — ABNORMAL HIGH (ref 70–99)
Glucose-Capillary: 154 mg/dL — ABNORMAL HIGH (ref 70–99)
Glucose-Capillary: 182 mg/dL — ABNORMAL HIGH (ref 70–99)
Glucose-Capillary: 178 mg/dL — ABNORMAL HIGH (ref 70–99)
Glucose-Capillary: 187 mg/dL — ABNORMAL HIGH (ref 70–99)

## 2019-07-04 MED ORDER — INSULIN GLARGINE 100 UNIT/ML ~~LOC~~ SOLN
4.0000 [IU] | Freq: Two times a day (BID) | SUBCUTANEOUS | Status: DC
  Filled 2019-07-04: qty 0.04

## 2019-07-04 MED ORDER — INSULIN GLARGINE 100 UNIT/ML ~~LOC~~ SOLN
4.0000 [IU] | Freq: Once | SUBCUTANEOUS | Status: AC
  Administered 2019-07-04: 4 [IU] via SUBCUTANEOUS
  Filled 2019-07-04: qty 0.04

## 2019-07-04 MED ORDER — DEXTROSE-NACL 5-0.45 % IV SOLN: INTRAVENOUS | Status: DC

## 2019-07-04 MED ORDER — SODIUM CHLORIDE 0.45 % IV SOLN: INTRAVENOUS | Status: DC

## 2019-07-04 MED ORDER — DEXTROSE 50 % IV SOLN: 0.0000 mL | INTRAVENOUS | Status: DC | PRN

## 2019-07-04 MED ORDER — INSULIN GLARGINE 100 UNIT/ML ~~LOC~~ SOLN
4.0000 [IU] | Freq: Every day | SUBCUTANEOUS | Status: DC
  Administered 2019-07-04: 4 [IU] via SUBCUTANEOUS
  Filled 2019-07-04: qty 0.04

## 2019-07-04 MED ORDER — SODIUM CHLORIDE 0.9 % IV SOLN: INTRAVENOUS | Status: DC

## 2019-07-04 MED ORDER — DEXTROSE 10 % IV BOLUS: 250.0000 mL | Freq: Once | INTRAVENOUS | Status: DC

## 2019-07-04 MED ORDER — SODIUM CHLORIDE 0.9 % IV SOLN
500.0000 mg | Freq: Once | INTRAVENOUS | Status: AC
  Administered 2019-07-04: 12:00:00 500 mg via INTRAVENOUS

## 2019-07-04 MED ORDER — DEXTROSE 10 % IV BOLUS
500.0000 mL | Freq: Once | INTRAVENOUS | Status: AC
  Administered 2019-07-04: 11:00:00 500 mL via INTRAVENOUS

## 2019-07-04 MED ORDER — INSULIN REGULAR(HUMAN) IN NACL 100-0.9 UT/100ML-% IV SOLN
INTRAVENOUS | Status: DC
  Administered 2019-07-04: 18:00:00 9 [IU]/h via INTRAVENOUS
  Administered 2019-07-05: 16:00:00 3.6 [IU]/h via INTRAVENOUS
  Administered 2019-07-07: 04:00:00 1.6 [IU]/h via INTRAVENOUS
  Filled 2019-07-04 (×3): qty 100

## 2019-07-04 NOTE — Progress Notes (Signed)
Updated the patient's mother Ms. Hassan Rowan via phone.  All questions answered.  On her way to visit Mr. Lagrand.

## 2019-07-04 NOTE — Consult Note (Signed)
Renal Service Consult Note [facility]  Jeffery [name] 07/04/2019 [name] Requesting Physician:  Dr Nevada Crane, C.   Reason for Consult:  Hypernatremia  HPI: The patient is a 59 y.o. 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 Pt was admitted w/ GIB , anemia and AoCKD 3 on 3/10. Creat was 3.5. CT showed persistent (known from prior studies) bilat severe hydronephrosis and enlarging prostate mass. Creat from 2020 baseline was around 2.0.  Pt had hx of bilat hydro for about a year but repeatedly refused to have neph tubes placed in the past. Neph was consulted on 3/14 and again suggested perc neph tubes however pt declined. However UOP was good and creat was stable at 3.5 and renal team signed off. Ultimately pt agreed to bilat perc neph tubes which was done yesterday morning on 3/20.  Pt's serum Na+ is now increasing from 153 pre procedure up to 167 this am. Asked to see for hypernatremia.   Patient seen in ICU bed, he is awake. Wife says more awake today than most of his stay.  No SOB or cough, no CP, no abd pain or N/V.    UOP last 5 days is 2.3L, 4.0L, 4.8 L, 4.5L and 6.7 L yesterday.  Total I/O since admit are 30L in and 51 L out  Wt's are down the last few days from peak 103kg down to 98kg today, admit was also 98kg.     ROS  denies CP  no joint pain   no HA  no blurry vision  no rash  no diarrhea  no nausea/ vomiting    Past Medical History  Past Medical History:  Diagnosis Date  . Cancer ([place])    stage IV prostate cancer per patient  . Diabetes mellitus, new onset ([place]) 07/2016  . Enlarged prostate   . HLD (hyperlipidemia)    Past Surgical History  Past Surgical History:  Procedure Laterality Date  . I & D EXTREMITY Left 01/27/2019   Procedure: IRRIGATION AND DEBRIDEMENT EXTREMITY;  Surgeon: Iran Planas, MD;  Location: [place];  Service: Orthopedics;  Laterality: Left;  . IR NEPHROSTOMY  PLACEMENT LEFT  07/03/2019  . IR NEPHROSTOMY PLACEMENT RIGHT  07/03/2019  . KNEE ARTHROSCOPY Right   . ORCHIECTOMY Bilateral 10/12/2018   Procedure: ORCHIECTOMY;  Surgeon: Lucas Mallow, MD;  Location: [facility];  Service: Urology;  Laterality: Bilateral;  . TRANSURETHRAL RESECTION OF PROSTATE  10/12/2018   Procedure: CYSTOSCOPY WITH URETHRAL DILATION;  Surgeon: Lucas Mallow, MD;  Location: [facility];  Service: Urology;;   Family History  Family History  Problem Relation Age of Onset  . Prostate cancer Father   . Heart disease Father   . Prostate cancer Paternal Grandfather    Social History  reports that he quit smoking about 6 years ago. His smoking use included cigarettes. He has a 63.00 pack-year smoking history. He has never used smokeless tobacco. He reports that he does not drink alcohol or use drugs. Allergies No Known Allergies Home medications Prior to Admission medications   Medication Sig Start Date End Date Taking? Authorizing Provider  Amino Acids-Protein Hydrolys (FEEDING SUPPLEMENT, PRO-STAT SUGAR FREE 64,) LIQD Take 30 mLs by mouth 3 (three) times daily. 10/14/18  Yes Hongalgi, Lenis Dickinson, MD  feeding supplement, ENSURE ENLIVE, (ENSURE ENLIVE) LIQD Take 237 mLs by mouth daily. 10/14/18  Yes Hongalgi, Lenis Dickinson, MD  finasteride (PROSCAR) 5 MG tablet  Take 5 mg by mouth daily. 02/12/18  Yes [provider]  furosemide (LASIX) 40 MG tablet Take 40 mg by mouth daily.   Yes [provider]  gabapentin (NEURONTIN) 300 MG capsule Take 1-2 capsules (300-600 mg total) by mouth 3 (three) times daily. 379m in am and 60648min evening Patient taking differently: Take 600 mg by mouth 3 (three) times daily.  05/31/19  Yes Shadad, FiMathis DadMD  HYDROcodone-acetaminophen (NORCO/VICODIN) 5-325 MG tablet TAKE 1 TO 2 TABLETS BY MOUTH EVERY 4 TO 6 HOURS AS NEEDED FOR PAIN (MAX OF 8 TABLETS IN 24 HOURS) Patient taking differently: Take 1-2 tablets by mouth every 4 (four) hours as needed  for moderate pain or severe pain. TAKE 1 TO 2 TABLETS BY MOUTH EVERY 4 TO 6 HOURS AS NEEDED FOR PAIN (MAX OF 8 TABLETS IN 24 HOURS) 06/15/19  Yes ShWyatt PortelaMD  metFORMIN (GLUCOPHAGE-XR) 500 MG 24 hr tablet Take 500-1,000 mg by mouth 2 (two) times daily. Take 2 tablets (1000 mg) in the am and Take 1 tablet at bedtime 01/12/19  Yes [provider]  Multiple Vitamin (MULTIVITAMIN WITH MINERALS) TABS tablet Take 1 tablet by mouth daily. 10/15/18  Yes Hongalgi, AnLenis DickinsonMD  predniSONE (DELTASONE) 5 MG tablet Take 1 tablet (5 mg total) by mouth daily with breakfast. 11/03/18  Yes Shadad, FiMathis DadMD  tamsulosin (FLOMAX) 0.4 MG CAPS capsule Take 2 capsules (0.8 mg total) by mouth daily after supper. 10/14/18  Yes Hongalgi, AnLenis DickinsonMD  vitamin B-12 (CYANOCOBALAMIN) 1000 MCG tablet Take 1 tablet (1,000 mcg total) by mouth daily. 10/14/18  Yes Hongalgi, AnLenis DickinsonMD  ZYTIGA 250 MG tablet TAKE 4 TABLETS BY MOUTH ONCE DAILY AS DIRECTED.  TAKE 1 HOUR BEFORE OR 2 HOURS AFTER A MEAL Patient taking differently: Take 1,000 mg by mouth daily.  12/18/18  Yes ShWyatt PortelaMD  Blood Glucose Monitoring Suppl (RELION CONFIRM GLUCOSE MONITOR) w/Device KIT Use 4 times daily before meals and bedtime as directed. 07/31/16   StHolley RaringMD  glucose blood (RELION GLUCOSE TEST STRIPS) test strip Use as instructed 07/31/16   StHolley RaringMD  Lancets 30G MISC Use 4 times daily before meals as directed. 07/31/16   StHolley RaringMD     Vitals:   07/04/19 0900 07/04/19 1100 07/04/19 1145 07/04/19 1200  BP: 112/75 (!) 154/75  (!) 161/67  Pulse: (!) 132 (!) 127  (!) 129  Resp: (!) 25 (!) 37  (!) 22  Temp:   (!) 101 F (38.3 C)   TempSrc:   Oral   SpO2: 95% 96%  95%  Weight:      Height:       Exam Gen gen weak, looks older than stated age No rash, cyanosis or gangrene Sclera anicteric, throat clear and moist  No jvd or bruits Chest clear bilat to bases RRR no MRG Abd soft ntnd no mass or ascites +bs Bilat  PCN tubes one draining clear yellow urine, the other lesser amts of urine MS no joint effusions or deformity Ext no sig LE or UE edema, no wounds or ulcers Neuro is alert, confused, nonfocal, gen weak    Home meds:  - metformin 1gm bid  - flomax 0.8 qd/ poscar 48m16m- prednisone 5 qam  - lasix 40 qd  - neurontin 600 tid/ hydrocodone prn qid  - prn's/ vitamins/ supplements    UA 3/10  >50 wbc, 6-10 rbc, prot 30  I/O total since admit = 30 L in and 50 L out   Wt on admit 98 kg, today 98kg   Assessment/ Plan: 1. Hypernatremia - hypovolemic, w/ post-obstructive diuresis. Becoming vol depleted due to large UOP's.  Will replace all GI/GU losses w/ 1/2 saline every 6 hrs to avoid vol depletion as a trigger for H2O retention, and will ^D5W up to 190 cc/hr as treatment for hypernatremia. Check labs q 6 hrs. Will follow.  2. Bilat ureteral obstruction/ severe hydro - from local prostate mets in pelvis. Is now SP bilat PCN done yest on 3/20. Baseline creat 2.0 from 2020, has been 3- 3.5 here prior to bilat PCN placement. Follow.  3. Enterococcus bacteremia - on ampicillin 4. CAP - on azithro/ rocephin IV 5. Debility - multifactorial 6. DM2, uncont- may need IV insulin, may be causing some osmotic diuresis 7. Metastatic prostate Ca - on Zytiga      Rob Nyonna Hargrove  MD 07/04/2019, 1:32 PM  Recent Labs  Lab 07/03/19 0142 07/04/19 0509  WBC 8.4 8.2  HGB 7.8* 7.6*   Recent Labs  Lab 06/29/19 1054 06/30/19 0156 07/03/19 0142 07/03/19 1858 07/03/19 2316 07/03/19 2316 07/04/19 0150 07/04/19 0150 07/04/19 0243 07/04/19 0243 07/04/19 0509 07/04/19 0509 07/04/19 0641 07/04/19 0641 07/04/19 0836 07/04/19 1046  K  --    < > 3.9   < > 4.2   < > 4.1   < > 4.0   < > 4.0   < > 4.0   < > 3.9 3.8  BUN  --    < > 50*   < > 59*   < > 60*   < > 59*   < > 60*   < > 58*   < > 59* 59*  CREATININE  --    < > 3.20*   < > 3.41*   < > 3.26*   < > 3.28*   < > 3.26*   < > 3.28*   < > 3.42* 3.20*   CALCIUM  --    < > 9.7   < > 10.3  --  10.0  --  10.1  --  10.2  --  10.3  --  10.3 10.4*  PHOS 5.1*  --  4.2  --   --   --   --   --   --   --   --   --   --   --   --   --    < > = values in this interval not displayed.

## 2019-07-04 NOTE — Progress Notes (Signed)
CRITICAL VALUE ALERT  Critical Value:  Na 166   Date & Time Notied:  07/04/19 1050  Provider Notified: Dr. Nevada Crane   Orders Received/Actions taken: New orders received

## 2019-07-04 NOTE — Progress Notes (Signed)
Pt requested Chaplain visit. When I arrived his mother was bedside and voluntarily left to give pt privacy. Pt said he wanted his mother to leave. He said, "she keeps getting in my face." He further stated that he does not want to hurt her feelings. I had pt repeat what he said in order to be sure. I gave this information to his nurse for follow-up. Please page [phone] if you have questions. [place], MDiv   07/04/19 1400  Clinical Encounter Type  Visited With Patient

## 2019-07-04 NOTE — Progress Notes (Signed)
Patient is eating better. He ate 75% of breakfast. Notified Dr. Nevada Crane ok to hold off on tube for now.

## 2019-07-04 NOTE — Progress Notes (Signed)
CRITICAL VALUE ALERT  Critical Value:  NA 166  Date & Time Notied:  07/04/19 1030   Provider Notified: Dr. Nevada Crane  Orders Received/Actions taken: No further orders

## 2019-07-04 NOTE — Progress Notes (Signed)
Chief Complaint: Patient was seen today for (B)PCN   Supervising Physician: Jacqulynn Cadet  Patient Status: [facility] - In-pt  Subjective: S/p Bilateral PCN for hydronephrosis. Some soreness at sites as expected.   Objective: Physical Exam: BP (!) 161/67 (BP Location: Left Arm)   Pulse (!) 129   Temp (!) 101 F (38.3 C) (Oral)   Resp (!) 22   Ht 5\' 9"  (1.753 m)   Wt 97.8 kg   SpO2 95%   BMI 31.84 kg/m  (R)PCN intact, site clean, NT clear UOP (L)PCN intact, site clean, NT, clear UOP   Current Facility-Administered Medications:  .  [DISCONTINUED] acetaminophen (TYLENOL) tablet 650 mg, 650 mg, Oral, Q6H PRN, 650 mg at 06/29/19 1624 **OR** acetaminophen (TYLENOL) suppository 650 mg, 650 mg, Rectal, Q6H PRN, Buford Dresser, MD .  acetaminophen (TYLENOL) tablet 1,000 mg, 1,000 mg, Oral, Q6H PRN, Buford Dresser, MD, 1,000 mg at 07/04/19 1207 .  ampicillin (OMNIPEN) 2 g in sodium chloride 0.9 % 100 mL IVPB, 2 g, Intravenous, Q8H, Buford Dresser, MD, Stopped at 07/04/19 0645 .  azithromycin (ZITHROMAX) 500 mg in sodium chloride 0.9 % 250 mL IVPB, 500 mg, Intravenous, Once, Kayleen Memos, DO, Last Rate: 250 mL/hr at 07/04/19 1202, 500 mg at 07/04/19 1202 .  Chlorhexidine Gluconate Cloth 2 % PADS 6 each, 6 each, Topical, Daily, Buford Dresser, MD, Stopped at 07/04/19 1113 .  dextrose 5 % solution, , Intravenous, Continuous, Roney Jaffe, MD, Last Rate: 150 mL/hr at 07/04/19 1100, Rate Change at 07/04/19 1100 .  feeding supplement (ENSURE ENLIVE) (ENSURE ENLIVE) liquid 237 mL, 237 mL, Oral, TID BM, Buford Dresser, MD, 237 mL at 07/04/19 1209 .  feeding supplement (PRO-STAT SUGAR FREE 64) liquid 30 mL, 30 mL, Oral, BID, Buford Dresser, MD, 30 mL at 07/04/19 1206 .  fentaNYL (SUBLIMAZE) injection 25 mcg, 25 mcg, Intravenous, Q2H PRN, Buford Dresser, MD .  gabapentin (NEURONTIN) capsule 100 mg, 100 mg, Oral, BID, Buford Dresser, MD, 100 mg at 07/04/19 0957 .  insulin aspart (novoLOG) injection 0-5 Units, 0-5 Units, Subcutaneous, QHS, Hall, Carole N, DO .  insulin aspart (novoLOG) injection 0-9 Units, 0-9 Units, Subcutaneous, TID WC, Hall, Carole N, DO, 3 Units at 07/04/19 1206 .  insulin glargine (LANTUS) injection 4 Units, 4 Units, Subcutaneous, Daily, Kayleen Memos, DO, 4 Units at 07/04/19 1113 .  MEDLINE mouth rinse, 15 mL, Mouth Rinse, BID, Buford Dresser, MD, 15 mL at 07/04/19 1210 .  metoprolol tartrate (LOPRESSOR) injection 2.5 mg, 2.5 mg, Intravenous, Q4H PRN, Buford Dresser, MD, 2.5 mg at 07/03/19 1753 .  metoprolol tartrate (LOPRESSOR) tablet 25 mg, 25 mg, Oral, BID, Irene Pap N, DO, 25 mg at 07/04/19 0957 .  multivitamin with minerals tablet 1 tablet, 1 tablet, Oral, Daily, Buford Dresser, MD, 1 tablet at 07/04/19 0957 .  ondansetron (ZOFRAN) tablet 4 mg, 4 mg, Oral, Q6H PRN **OR** ondansetron (ZOFRAN) injection 4 mg, 4 mg, Intravenous, Q6H PRN, Buford Dresser, MD .  oxyCODONE (Oxy IR/ROXICODONE) immediate release tablet 5 mg, 5 mg, Oral, Q6H PRN, Buford Dresser, MD, 5 mg at 07/03/19 2725 .  pantoprazole (PROTONIX) EC tablet 40 mg, 40 mg, Oral, Q0600, Buford Dresser, MD, 40 mg at 07/04/19 0625 .  protein supplement (RESOURCE BENEPROTEIN) powder packet 6 g, 1 Scoop, Oral, TID WC, Buford Dresser, MD, 6 g at 07/04/19 0945 .  sodium chloride (OCEAN) 0.65 % nasal spray 1 spray, 1 spray, Each Nare, PRN, Buford Dresser, MD  Labs: CBC Recent  Labs    07/03/19 0142 07/04/19 0509  WBC 8.4 8.2  HGB 7.8* 7.6*  HCT 28.1* 28.4*  PLT 173 153   BMET Recent Labs    07/04/19 0641 07/04/19 0836  NA 163* 166*  K 4.0 3.9  CL 124* 126*  CO2 28 29  GLUCOSE 243* 233*  BUN 58* 59*  CREATININE 3.28* 3.42*  CALCIUM 10.3 10.3   LFT No results for input(s): PROT, ALBUMIN, AST, ALT, ALKPHOS, BILITOT, BILIDIR, IBILI, LIPASE in the last 72  hours. PT/INR Recent Labs    07/03/19 0142  LABPROT 16.4*  INR 1.3*     Studies/Results: IR NEPHROSTOMY PLACEMENT LEFT  Result Date: 07/03/2019 INDICATION: 59 year old male with metastatic prostate cancer, bilateral obstructed hydronephrosis and acute kidney injury. He requires placement of bilateral percutaneous nephrostomy tubes. A thoracentesis was also requested based off of CT imaging from 06/29/2019. However, when the patient was evaluated with real-time ultrasound, there is only trace pleural fluid which was insufficient for drainage. Therefore, thoracentesis was deferred. EXAM: IR NEPHROSTOMY PLACEMENT LEFT; IR NEPHROSTOMY PLACEMENT RIGHT COMPARISON:  None. MEDICATIONS: Patient currently admitted as an inpatient receiving intravenous antibiotics. No additional antibiotic prophylaxis was administered. ANESTHESIA/SEDATION: Fentanyl 100 mcg IV; Versed 2 mg IV Moderate Sedation Time:  37 minutes The patient was continuously monitored during the procedure by the interventional radiology nurse under my direct supervision. CONTRAST:  20 mL Isovue-300-administered into the collecting system(s) FLUOROSCOPY TIME:  Fluoroscopy Time: 2 minutes 12 seconds (22 mGy). COMPLICATIONS: None immediate. TECHNIQUE: The procedure, risks, benefits, and alternatives were explained to the patient. Questions regarding the procedure were encouraged and answered. The patient understands and consents to the procedure. The left flank was prepped with chlorhexidine in a sterile fashion, and a sterile drape was applied covering the operative field. A sterile gown and sterile gloves were used for the procedure. Local anesthesia was provided with 1% Lidocaine. The left flank was interrogated with ultrasound and the left kidney identified. The kidney is hydronephrotic. A suitable access site on the skin overlying the lower pole, posterior calix was identified. After local mg anesthesia was achieved, a small skin nick was made with  an 11 blade scalpel. A 21 gauge Accustick needle was then advanced under direct sonographic guidance into the lower pole of the left kidney. A 0.018 inch wire was advanced under fluoroscopic guidance into the left renal collecting system. The Accustick sheath was then advanced over the wire and a 0.018 system exchanged for a 0.035 system. Gentle hand injection of contrast material confirms placement of the sheath within the renal collecting system. There is marked hydronephrosis. The tract from the scan into the renal collecting system was then dilated serially to 10-French. A 10-French Cook all-purpose drain was then placed and positioned under fluoroscopic guidance. The locking loop is well formed within the left renal pelvis. The catheter was secured to the skin with 2-0 Prolene and a sterile bandage was placed. Catheter was left to gravity bag drainage. Using the exact same technique, a 10.2 French percutaneous nephrostomy catheter was placed in the right kidney via a posterior lower pole calyx. Contrast injection into the renal collecting system demonstrates marked hydronephrosis. Both catheters were secured to the skin with 0 Prolene suture and connected to gravity bag drainage. The patient tolerated the procedure well. IMPRESSION: Successful placement of a bilateral 10 French percutaneous nephrostomy tubes. PLAN: 1. Maintain tubes to gravity bag drainage.  Follow output. 2. Return Interventional Radiology in 8 weeks for bilateral nephrostomy tube exchange.  Electronically Signed   By: Jacqulynn Cadet M.D.   On: 07/03/2019 10:19   IR NEPHROSTOMY PLACEMENT RIGHT  Result Date: 07/03/2019 INDICATION: 59 year old male with metastatic prostate cancer, bilateral obstructed hydronephrosis and acute kidney injury. He requires placement of bilateral percutaneous nephrostomy tubes. A thoracentesis was also requested based off of CT imaging from 06/29/2019. However, when the patient was evaluated with real-time  ultrasound, there is only trace pleural fluid which was insufficient for drainage. Therefore, thoracentesis was deferred. EXAM: IR NEPHROSTOMY PLACEMENT LEFT; IR NEPHROSTOMY PLACEMENT RIGHT COMPARISON:  None. MEDICATIONS: Patient currently admitted as an inpatient receiving intravenous antibiotics. No additional antibiotic prophylaxis was administered. ANESTHESIA/SEDATION: Fentanyl 100 mcg IV; Versed 2 mg IV Moderate Sedation Time:  37 minutes The patient was continuously monitored during the procedure by the interventional radiology nurse under my direct supervision. CONTRAST:  20 mL Isovue-300-administered into the collecting system(s) FLUOROSCOPY TIME:  Fluoroscopy Time: 2 minutes 12 seconds (22 mGy). COMPLICATIONS: None immediate. TECHNIQUE: The procedure, risks, benefits, and alternatives were explained to the patient. Questions regarding the procedure were encouraged and answered. The patient understands and consents to the procedure. The left flank was prepped with chlorhexidine in a sterile fashion, and a sterile drape was applied covering the operative field. A sterile gown and sterile gloves were used for the procedure. Local anesthesia was provided with 1% Lidocaine. The left flank was interrogated with ultrasound and the left kidney identified. The kidney is hydronephrotic. A suitable access site on the skin overlying the lower pole, posterior calix was identified. After local mg anesthesia was achieved, a small skin nick was made with an 11 blade scalpel. A 21 gauge Accustick needle was then advanced under direct sonographic guidance into the lower pole of the left kidney. A 0.018 inch wire was advanced under fluoroscopic guidance into the left renal collecting system. The Accustick sheath was then advanced over the wire and a 0.018 system exchanged for a 0.035 system. Gentle hand injection of contrast material confirms placement of the sheath within the renal collecting system. There is marked  hydronephrosis. The tract from the scan into the renal collecting system was then dilated serially to 10-French. A 10-French Cook all-purpose drain was then placed and positioned under fluoroscopic guidance. The locking loop is well formed within the left renal pelvis. The catheter was secured to the skin with 2-0 Prolene and a sterile bandage was placed. Catheter was left to gravity bag drainage. Using the exact same technique, a 10.2 French percutaneous nephrostomy catheter was placed in the right kidney via a posterior lower pole calyx. Contrast injection into the renal collecting system demonstrates marked hydronephrosis. Both catheters were secured to the skin with 0 Prolene suture and connected to gravity bag drainage. The patient tolerated the procedure well. IMPRESSION: Successful placement of a bilateral 10 French percutaneous nephrostomy tubes. PLAN: 1. Maintain tubes to gravity bag drainage.  Follow output. 2. Return Interventional Radiology in 8 weeks for bilateral nephrostomy tube exchange. Electronically Signed   By: Jacqulynn Cadet M.D.   On: 07/03/2019 10:19    Assessment/Plan: S/p bilat PCN for persistent hydro from distal ureteral obstruction PCNs intact, UOP from both L>R IR following    LOS: 11 days   I spent a total of 15 minutes in face to face in clinical consultation, greater than 50% of which was counseling/coordinating care for (B)PCN  Ascencion Dike PA-C 07/04/2019 12:33 PM

## 2019-07-04 NOTE — Progress Notes (Signed)
CRITICAL VALUE ALERT  Critical Value: Na 167  Date & Time Notied:  07/04/19 1250  Provider Notified: Dr. Nevada Crane   Orders Received/Actions taken: no new orders continue current treatment

## 2019-07-04 NOTE — Progress Notes (Signed)
PROGRESS NOTE  Jeffery [name] DSK:[id number] DOB: 27-Mar-1961 DOA: 06/28/2019 PCP: [name], MD  HPI/Recap of past 24 hours: Patient is a 59 year old gentleman with PMH of stage IV prostate cancer with mets to bones, type II diabetes, polyneuropathy, hyperlipidemia, chronic kidney disease stage IIIB, anemia of chronic disease, chronic guaiac positive stools who presented to the emergency room with generalized weakness, hypoxia and tachycardia, painful urination.   He was working with therapies at home, he was found shaky and tremulous, oncology office was called who directed him to the ER.  Febrile tachycardic in the ED.  Hemoglobin 6.4.  Transfused 2 unit PRBCs.  Creatinine up to 3.54.   CT renal 3/10 showed invasive prostate cancer into the bladder with severe bilateral hydronephrosis.  A Foley catheter was inserted.  TRH was asked to admit.  Repeated imaging showed persistent severe bilateral hydronephrosis.  Urology and nephrology recommended bilateral nephrostomy tube placement by interventional radiology.  Patient declined initially then agreed to proceed on 07/03/19.  Per oncology his cancer is not curable.  Per Nephrology, not a good candidate for hemodialysis.  Due to poor prognosis palliative care team was consulted and is following.  Hospital course complicated by intermittent high grade fevers, suspected hematuria and positive FOBT with Hg drop down to 7.1.  Transfused 1U PRBC on 3/14, Hg up to 8.8 post RBC transfusion. Dropped again and transfused 1U PRBC on 3/17.  Received IV lasix due to volume overload with acute hypoxia on 3/14-3/15.  Responded well with improvement of hypoxia. IV lasix dced on 3/15 due to rise in creatinine and soft BPs.  Restarted IV fluid on 3/16 by nephrology for 2 days.  IV azithromycin and Rocephin were added on 06/30/2019 due to concern for possible pneumonia, Covered empirically for community-acquired pneumonia, completed 5 days on 3/21.  Repeated  blood cultures x 2 06/26/19 Negative Final.  TEE completed on 06/25/2019 no evidence of vegetation/endocarditis.  Patient agreeable to proceed with nephrostomy tube placement by IR on 07/03/2019.  No thoracentesis done due to no adequate amount of fluid.  3/20 with acute hypovolemic hypernatremia.  Restarted on IV fluid D5W.  Na+ continued to trend up to 166.  Nephrology Dr. Jonnie Finner re-consulted.  Suspect urinary losses, putting out quite a bit of fluid.  07/04/19:  More alert, weak.  No specific complaints.  Poor oral intake.  We will attempt cortrack placement if agreeable.   Assessment/Plan: Principal Problem:   Severe sepsis (HCC) Active Problems:   Urinary retention   AKI (acute kidney injury) ([place])   Cholelithiasis   Diabetes mellitus, new onset ([place])   DM2 (diabetes mellitus, type 2) (HCC)   CKD (chronic kidney disease) stage 3, GFR 30-59 ml/min   Prostate cancer (HCC)   GI bleed   Heme positive stool   Anemia due to chronic blood loss   Anemia   Enterococcal bacteremia   Palliative care by specialist   Goals of care, counseling/discussion   Metabolic encephalopathy   Pleural effusion  Sepsis, present on admission suspect from urinary source, Enterococcus bacteremia and possibly CAP, POA. Leukocytosis and neutrophilia have resolved. Still spikes fever intermittently. He is on ampicillin for Proteus Bacteremia Completed 5 days of IV azithromycin/Rocephin to cover CAP. Repeated blood cultures x2 have been negative final. ID following  Acute hypovolemic hypernatremia suspect 2/2 to urinary losses Has had lots of output from urinary foley and left percutaneous nephrostomy drain Serum sodium up to 166 Continue D5W at 150 cc/h BMP every 2  hours to closely monitor sodium level and serum glucose Nephrology re consulted  Severe bilateral hydronephrosis in the setting of obstruction from progressive enlarging prostate mass Post B/L nephrostomy tube placement by IR 3/20 Managed by  IR; We greatly appreciate assistance.  Worsening acute kidney injury on chronic kidney disease stage IIIB likely multifactorial postrenal and prerenal 2/2 to obstructive uropathy from prostate cancer and intravascular volume depletion:  Baseline creatinine appears to be 2.0 with GFR of 36 Presented with creatinine up to 3.8 Creatinine  3.52>> 3.3>> 3.1>> 3.2>> 3.2>> 3.42. Continue on D5W at 150 cc/h Nephro re-consulted 3/21. Continue Foley catheter #10  Enterococcus bacteremia from 2/2 positive bottles Management as per above Repeated blood cultures negative final. TEE negative for endocarditis. Colonoscopy once the patient is hemodynamically stable. On ampicillin, duration per ID rec.  Refractory sinus tachycardia/fevers likely multifactorial 2/2 to sepsis, malignancy, versus others ST on 12 lead EKG Heart rate in the 120s-130s P.o. metoprolol increased to 25 mg twice daily. Continue IV lopressor 2.5 mg q3H prn with HR>130 when MAP>70 Last TSH normal 1.9 on 05/27/2019, repeat also normal Bilateral lower extremity Doppler ultrasound negative for DVT on 06/28/2019  Acute hypoxic respiratory failure secondary to pulmonary edema, atelectasis Not on oxygen supplementation at baseline Continue to maintain O2 saturation greater than 92% Encourage incentive spirometer to minimize fluid accumulation in the lungs.  Resolved hypokalemia post repletion Potassium 3.9  Severe protein calorie malnutrition Albumin 1.7 BMI 32 with loss of muscle mass Continue to encourage increase in protein calorie intake Dietitian following, appreciate recommendations.  Resolved prolonged QTC 12 lead EKG on 3/12 QTC >540 Repeated 12 lead EKG done on 3/14 QTc 353  Left lower extremity pain, DVT ruled out. Chronic left lower extremity weakness with worsening pain Bilateral Doppler ultrasound negative for DVT.  Severe physical debility Continue PT OT with assistance and fall precaution as  tolerated Encourage increase protein calorie intake and mobilize as tolerated  Hyperphosphatemia in the setting of acute renal failure Phosphorous 5.1 Deferred management to nephrology  Anemia of chronic disease, positive FOBT and mild hematuria:  Received 2 units of PRBC.  Hg 7.2>>7.6> 7.1>> 7.9>> 7.8>> 7.6 Transfused 1 unit PRBC on 06/27/2019 Transfused 1 U PRBC 06/30/19 Continue PPI  Type 2 diabetes with hyperglycemia exacerbated by D5W fluid started for hypernatremia:  A1C 6.6 on 06/24/19.   Continue sliding scale insulin.  Metastatic prostate cancer on Zytiga, bladder obstruction, debility and chronic anemia: Followed by his oncologist.  Will have outpatient follow-up after stabilization. Zytiga on hold  Goals of care Palliative care following  DVT prophylaxis: DVT prophylaxis with SCDs Code Status: Full code  Family Communication:  Updated the patient's mother via phone 3/20.  Disposition Plan: patient is from home. Anticipated DC to home once interventional radiology and infectious disease have signed off, Barriers to discharge active treatment, IV antibiotics, hemodynamic instability.  Consultants:   Urology  GI  Oncology  Palliative medicine  Nephrology  Cardiology  Dietitian  Interventional radiology  Infectious disease  Procedures:   Foley cath insertion     Objective: Vitals:   07/04/19 0500 07/04/19 0600 07/04/19 0800 07/04/19 0900  BP: 98/63 129/82 (!) 173/99 112/75  Pulse: (!) 120 (!) 133 (!) 128 (!) 132  Resp: (!) 27 (!) 26 (!) 34 (!) 25  Temp:      TempSrc:      SpO2: 94% 95% 95% 95%  Weight: 97.8 kg     Height:  Intake/Output Summary (Last 24 hours) at 07/04/2019 1038 Last data filed at 07/04/2019 0724 Gross per 24 hour  Intake 2427.16 ml  Output 6445 ml  Net -4017.84 ml   Filed Weights   07/04/2019 0500 07/03/19 0500 07/04/19 0500  Weight: 102.5 kg 99 kg 97.8 kg    Exam:  . General: 59 y.o. year-old male  well-developed and nourished in no acute distress.  Minimally interactive.   . Cardiovascular: Tachycardic with no rubs or gallops. [name] Respiratory: Mild rales at bases no wheezing noted.  Poor inspiratory effort.  Abdomen: Obese nontender normal bowel sounds present.   . Musculoskeletal: No lower extremity edema bilaterally.   [name] Psychiatry: Mood is appropriate for condition and setting.  Data Reviewed: CBC: Recent Labs  Lab 06/30/19 0156 06/30/19 0156 06/30/19 0839 07/01/19 0209 07/01/2019 0214 07/03/19 0142 07/04/19 0509  WBC 8.5  --   --  7.0 7.8 8.4 8.2  NEUTROABS 6.4  --   --  5.0 5.5 5.8 5.5  HGB 6.9*   < > 7.4* 7.4* 7.9* 7.8* 7.6*  HCT 24.6*   < > 26.2* 26.3* 27.3* 28.1* 28.4*  MCV 92.8  --   --  92.9 92.5 94.9 95.9  PLT 159  --   --  156 196 173 153   < > = values in this interval not displayed.   Basic Metabolic Panel: Recent Labs  Lab 06/28/19 0316 06/29/19 0212 06/29/19 1054 06/30/19 0156 07/03/19 0142 07/03/19 1652 07/04/19 0150 07/04/19 0243 07/04/19 0509 07/04/19 0641 07/04/19 0836  NA 145   < >  --    < > 153*   < > 162* 163* 164* 163* 166*  K 3.6   < >  --    < > 3.9   < > 4.1 4.0 4.0 4.0 3.9  CL 110   < >  --    < > 117*   < > 125* 125* 125* 124* 126*  CO2 22   < >  --    < > 26   < > 27 28 29 28 29   GLUCOSE 124*   < >  --    < > 182*   < > 240* 241* 245* 243* 233*  BUN 39*   < >  --    < > 50*   < > 60* 59* 60* 58* 59*  CREATININE 3.36*   < >  --    < > 3.20*   < > 3.26* 3.28* 3.26* 3.28* 3.42*  CALCIUM 9.1   < >  --    < > 9.7   < > 10.0 10.1 10.2 10.3 10.3  MG 2.0  --   --   --  2.1  --   --   --   --   --   --   PHOS  --   --  5.1*  --  4.2  --   --   --   --   --   --    < > = values in this interval not displayed.   GFR: Estimated Creatinine Clearance: 27.8 mL/min (A) (by C-G formula based on SCr of 3.42 mg/dL (H)). Liver Function Tests: Recent Labs  Lab 07/01/19 0209  AST 35  ALT 21  ALKPHOS 145*  BILITOT 0.3  PROT 5.5*  ALBUMIN 1.5*     No results for input(s): LIPASE, AMYLASE in the last 168 hours. No results for input(s): AMMONIA in the last 168 hours. Coagulation  Profile: Recent Labs  Lab 07/03/19 0142  INR 1.3*   Cardiac Enzymes: No results for input(s): CKTOTAL, CKMB, CKMBINDEX, TROPONINI in the last 168 hours. BNP (last 3 results) No results for input(s): PROBNP in the last 8760 hours. HbA1C: No results for input(s): HGBA1C in the last 72 hours. CBG: Recent Labs  Lab 07/03/19 1139 07/03/19 1619 07/03/19 1817 07/03/19 2212 07/04/19 0838  GLUCAP 150* 180* 169* 153* 194*   Lipid Profile: No results for input(s): CHOL, HDL, LDLCALC, TRIG, CHOLHDL, LDLDIRECT in the last 72 hours. Thyroid Function Tests: Recent Labs    07/03/19 1652  TSH 2.218   Anemia Panel: No results for input(s): VITAMINB12, FOLATE, FERRITIN, TIBC, IRON, RETICCTPCT in the last 72 hours. Urine analysis:    Component Value Date/Time   COLORURINE YELLOW 07/06/2019 1537   APPEARANCEUR CLEAR 07/11/2019 1537   LABSPEC 1.006 07/09/2019 1537   PHURINE 6.0 06/26/2019 1537   GLUCOSEU NEGATIVE 06/17/2019 1537   HGBUR MODERATE (A) 06/27/2019 1537   BILIRUBINUR NEGATIVE 06/18/2019 1537   KETONESUR NEGATIVE 07/09/2019 1537   PROTEINUR 30 (A) 07/04/2019 1537   NITRITE NEGATIVE 07/10/2019 1537   LEUKOCYTESUR LARGE (A) 07/12/2019 1537   Sepsis Labs: @LABRCNTIP (procalcitonin:4,lacticidven:4)  ) Recent Results (from the past 240 hour(s))  Culture, blood (routine x 2)     Status: None   Collection Time: 06/26/19  2:10 AM   Specimen: BLOOD  Result Value Ref Range Status   Specimen Description   Final    BLOOD RIGHT ARM Performed at [facility], [place] [address] [place], [place] [address]    Special Requests   Final    BOTTLES DRAWN AEROBIC ONLY Blood Culture adequate volume Performed at [facility] [address], [place], [place] [address]    Culture   Final    NO GROWTH 5 DAYS Performed  at [facility], [place] [address] [place], [place] [address]    Report Status 07/01/2019 FINAL  Final  Culture, blood (routine x 2)     Status: None   Collection Time: 06/26/19  2:10 AM   Specimen: BLOOD  Result Value Ref Range Status   Specimen Description   Final    BLOOD RIGHT HAND Performed at [facility] [address], [place], [place] [address]    Special Requests   Final    BOTTLES DRAWN AEROBIC ONLY Blood Culture adequate volume Performed at [facility] [address], [place], [place] [address]    Culture   Final    NO GROWTH 5 DAYS Performed at [facility], [place] [address] [place], [place] [address]    Report Status 07/01/2019 FINAL  Final      Studies: No results found.  Scheduled Meds: . Chlorhexidine Gluconate Cloth  6 each Topical Daily  . feeding supplement (ENSURE ENLIVE)  237 mL Oral TID BM  . feeding supplement (PRO-STAT SUGAR FREE 64)  30 mL Oral BID  . gabapentin  100 mg Oral BID  . insulin aspart  0-5 Units Subcutaneous QHS  . insulin aspart  0-9 Units Subcutaneous TID WC  . insulin glargine  4 Units Subcutaneous Daily  . mouth rinse  15 mL Mouth Rinse BID  . metoprolol tartrate  25 mg Oral BID  . multivitamin with minerals  1 tablet Oral Daily  . pantoprazole  40 mg Oral Q0600  . protein supplement  1 Scoop Oral TID WC    Continuous Infusions: .  ampicillin (OMNIPEN) IV Stopped (07/04/19 0645)  . azithromycin Stopped (07/03/19 1133)  . cefTRIAXone (ROCEPHIN)  IV 2 g (07/04/19 0954)  . dextrose    . dextrose 125 mL/hr at 07/04/19 0718     LOS: 11 days     Kayleen Memos, MD [facility] Pager [phone]  If 7PM-7AM, please contact night-coverage [email or web address] Password [facility] 07/04/2019, 10:38 AM

## 2019-07-04 NOTE — Progress Notes (Signed)
CRITICAL VALUE ALERT  Critical Value:  Sodium 162  Date & Time Notied:  07/04/19 1700  Provider Notified: Dr. Nevada Crane   Orders Received/Actions taken: continue with current treatment.

## 2019-07-04 NOTE — Progress Notes (Addendum)
CRITICAL VALUE ALERT  Critical Value:  Sodium 164  Date & Time Notied:  07/04/19 1500  Provider Notified: Dr Nevada Crane    Orders Received/Actions taken: Consistent with previous value continue current treatment

## 2019-07-05 DIAGNOSIS — J189 Pneumonia, unspecified organism: Secondary | ICD-10-CM

## 2019-07-05 LAB — GLUCOSE, CAPILLARY
Glucose-Capillary: 117 mg/dL — ABNORMAL HIGH (ref 70–99)
Glucose-Capillary: 127 mg/dL — ABNORMAL HIGH (ref 70–99)
Glucose-Capillary: 133 mg/dL — ABNORMAL HIGH (ref 70–99)
Glucose-Capillary: 134 mg/dL — ABNORMAL HIGH (ref 70–99)
Glucose-Capillary: 135 mg/dL — ABNORMAL HIGH (ref 70–99)
Glucose-Capillary: 138 mg/dL — ABNORMAL HIGH (ref 70–99)
Glucose-Capillary: 138 mg/dL — ABNORMAL HIGH (ref 70–99)
Glucose-Capillary: 150 mg/dL — ABNORMAL HIGH (ref 70–99)
Glucose-Capillary: 156 mg/dL — ABNORMAL HIGH (ref 70–99)
Glucose-Capillary: 156 mg/dL — ABNORMAL HIGH (ref 70–99)
Glucose-Capillary: 157 mg/dL — ABNORMAL HIGH (ref 70–99)
Glucose-Capillary: 159 mg/dL — ABNORMAL HIGH (ref 70–99)
Glucose-Capillary: 171 mg/dL — ABNORMAL HIGH (ref 70–99)
Glucose-Capillary: 175 mg/dL — ABNORMAL HIGH (ref 70–99)
Glucose-Capillary: 175 mg/dL — ABNORMAL HIGH (ref 70–99)

## 2019-07-05 LAB — BASIC METABOLIC PANEL
Anion gap: 9 (ref 5–15)
Anion gap: 10 (ref 5–15)
Anion gap: 9 (ref 5–15)
Anion gap: 9 (ref 5–15)
Anion gap: 12 (ref 5–15)
Anion gap: 10 (ref 5–15)
BUN: 49 mg/dL — ABNORMAL HIGH (ref 6–20)
BUN: 48 mg/dL — ABNORMAL HIGH (ref 6–20)
BUN: 48 mg/dL — ABNORMAL HIGH (ref 6–20)
BUN: 46 mg/dL — ABNORMAL HIGH (ref 6–20)
BUN: 44 mg/dL — ABNORMAL HIGH (ref 6–20)
BUN: 41 mg/dL — ABNORMAL HIGH (ref 6–20)
CO2: 27 mmol/L (ref 22–32)
CO2: 25 mmol/L (ref 22–32)
CO2: 28 mmol/L (ref 22–32)
CO2: 25 mmol/L (ref 22–32)
CO2: 25 mmol/L (ref 22–32)
CO2: 24 mmol/L (ref 22–32)
Calcium: 10.5 mg/dL — ABNORMAL HIGH (ref 8.9–10.3)
Calcium: 10.5 mg/dL — ABNORMAL HIGH (ref 8.9–10.3)
Calcium: 10.7 mg/dL — ABNORMAL HIGH (ref 8.9–10.3)
Calcium: 10.4 mg/dL — ABNORMAL HIGH (ref 8.9–10.3)
Calcium: 10.6 mg/dL — ABNORMAL HIGH (ref 8.9–10.3)
Calcium: 10.1 mg/dL (ref 8.9–10.3)
Chloride: 120 mmol/L — ABNORMAL HIGH (ref 98–111)
Chloride: 121 mmol/L — ABNORMAL HIGH (ref 98–111)
Chloride: 120 mmol/L — ABNORMAL HIGH (ref 98–111)
Chloride: 119 mmol/L — ABNORMAL HIGH (ref 98–111)
Chloride: 118 mmol/L — ABNORMAL HIGH (ref 98–111)
Chloride: 117 mmol/L — ABNORMAL HIGH (ref 98–111)
Creatinine, Ser: 2.8 mg/dL — ABNORMAL HIGH (ref 0.61–1.24)
Creatinine, Ser: 2.66 mg/dL — ABNORMAL HIGH (ref 0.61–1.24)
Creatinine, Ser: 2.76 mg/dL — ABNORMAL HIGH (ref 0.61–1.24)
Creatinine, Ser: 2.52 mg/dL — ABNORMAL HIGH (ref 0.61–1.24)
Creatinine, Ser: 2.26 mg/dL — ABNORMAL HIGH (ref 0.61–1.24)
Creatinine, Ser: 2.33 mg/dL — ABNORMAL HIGH (ref 0.61–1.24)
GFR calc Af Amer: 28 mL/min — ABNORMAL LOW (ref >60)
GFR calc Af Amer: 30 mL/min — ABNORMAL LOW (ref >60)
GFR calc Af Amer: 28 mL/min — ABNORMAL LOW (ref >60)
GFR calc Af Amer: 32 mL/min — ABNORMAL LOW (ref >60)
GFR calc Af Amer: 36 mL/min — ABNORMAL LOW (ref >60)
GFR calc Af Amer: 35 mL/min — ABNORMAL LOW (ref >60)
GFR calc non Af Amer: 24 mL/min — ABNORMAL LOW (ref >60)
GFR calc non Af Amer: 26 mL/min — ABNORMAL LOW (ref >60)
GFR calc non Af Amer: 25 mL/min — ABNORMAL LOW (ref >60)
GFR calc non Af Amer: 27 mL/min — ABNORMAL LOW (ref >60)
GFR calc non Af Amer: 31 mL/min — ABNORMAL LOW (ref >60)
GFR calc non Af Amer: 30 mL/min — ABNORMAL LOW (ref >60)
Glucose, Bld: 176 mg/dL — ABNORMAL HIGH (ref 70–99)
Glucose, Bld: 143 mg/dL — ABNORMAL HIGH (ref 70–99)
Glucose, Bld: 140 mg/dL — ABNORMAL HIGH (ref 70–99)
Glucose, Bld: 190 mg/dL — ABNORMAL HIGH (ref 70–99)
Glucose, Bld: 165 mg/dL — ABNORMAL HIGH (ref 70–99)
Glucose, Bld: 167 mg/dL — ABNORMAL HIGH (ref 70–99)
Potassium: 3.4 mmol/L — ABNORMAL LOW (ref 3.5–5.1)
Potassium: 3.6 mmol/L (ref 3.5–5.1)
Potassium: 3.5 mmol/L (ref 3.5–5.1)
Potassium: 4 mmol/L (ref 3.5–5.1)
Potassium: 4 mmol/L (ref 3.5–5.1)
Potassium: 3.8 mmol/L (ref 3.5–5.1)
Sodium: 156 mmol/L — ABNORMAL HIGH (ref 135–145)
Sodium: 156 mmol/L — ABNORMAL HIGH (ref 135–145)
Sodium: 157 mmol/L — ABNORMAL HIGH (ref 135–145)
Sodium: 153 mmol/L — ABNORMAL HIGH (ref 135–145)
Sodium: 155 mmol/L — ABNORMAL HIGH (ref 135–145)
Sodium: 151 mmol/L — ABNORMAL HIGH (ref 135–145)

## 2019-07-05 LAB — CBC WITH DIFFERENTIAL/PLATELET
Abs Immature Granulocytes: 0.4 10*3/uL — ABNORMAL HIGH (ref 0.00–0.07)
Basophils Absolute: 0 10*3/uL (ref 0.0–0.1)
Basophils Relative: 0 %
Eosinophils Absolute: 0.2 10*3/uL (ref 0.0–0.5)
Eosinophils Relative: 2 %
HCT: 25.2 % — ABNORMAL LOW (ref 39.0–52.0)
Hemoglobin: 6.8 g/dL — CL (ref 13.0–17.0)
Immature Granulocytes: 5 %
Lymphocytes Relative: 21 %
Lymphs Abs: 1.6 10*3/uL (ref 0.7–4.0)
MCH: 26.6 pg (ref 26.0–34.0)
MCHC: 27 g/dL — ABNORMAL LOW (ref 30.0–36.0)
MCV: 98.4 fL (ref 80.0–100.0)
Monocytes Absolute: 0.5 10*3/uL (ref 0.1–1.0)
Monocytes Relative: 6 %
Neutro Abs: 5 10*3/uL (ref 1.7–7.7)
Neutrophils Relative %: 66 %
Platelets: 153 10*3/uL (ref 150–400)
RBC: 2.56 MIL/uL — ABNORMAL LOW (ref 4.22–5.81)
RDW: 19 % — ABNORMAL HIGH (ref 11.5–15.5)
WBC: 7.6 10*3/uL (ref 4.0–10.5)
nRBC: 0.3 % — ABNORMAL HIGH (ref 0.0–0.2)

## 2019-07-05 LAB — HEMOGLOBIN AND HEMATOCRIT, BLOOD
HCT: 26.8 % — ABNORMAL LOW (ref 39.0–52.0)
HCT: 31.8 % — ABNORMAL LOW (ref 39.0–52.0)
Hemoglobin: 7.2 g/dL — ABNORMAL LOW (ref 13.0–17.0)
Hemoglobin: 8.9 g/dL — ABNORMAL LOW (ref 13.0–17.0)

## 2019-07-05 LAB — HEPATIC FUNCTION PANEL
ALT: 26 U/L (ref 0–44)
AST: 35 U/L (ref 15–41)
Albumin: 1.5 g/dL — ABNORMAL LOW (ref 3.5–5.0)
Alkaline Phosphatase: 149 U/L — ABNORMAL HIGH (ref 38–126)
Bilirubin, Direct: <0.1 mg/dL (ref 0.0–0.2)
Total Bilirubin: 0.3 mg/dL (ref 0.3–1.2)
Total Protein: 6.1 g/dL — ABNORMAL LOW (ref 6.5–8.1)

## 2019-07-05 LAB — PREPARE RBC (CROSSMATCH)

## 2019-07-05 MED ORDER — SODIUM CHLORIDE 0.45 % IV SOLN: INTRAVENOUS | Status: AC

## 2019-07-05 MED ORDER — SODIUM CHLORIDE 0.9% IV SOLUTION: Freq: Once | INTRAVENOUS | Status: AC

## 2019-07-05 MED ORDER — SODIUM CHLORIDE 0.9 % IV SOLN
2.0000 g | Freq: Four times a day (QID) | INTRAVENOUS | Status: DC
  Administered 2019-07-05 – 2019-07-07 (×8): 2 g via INTRAVENOUS
  Filled 2019-07-05 (×2): qty 2
  Filled 2019-07-05 (×4): qty 2000
  Filled 2019-07-05 (×2): qty 2
  Filled 2019-07-05 (×3): qty 2000
  Filled 2019-07-05: qty 2

## 2019-07-05 MED ORDER — METOPROLOL TARTRATE 25 MG PO TABS
50.0000 mg | ORAL_TABLET | Freq: Two times a day (BID) | ORAL | Status: DC
  Administered 2019-07-05: 50 mg via ORAL
  Filled 2019-07-05: qty 2

## 2019-07-05 MED ORDER — POTASSIUM CHLORIDE 10 MEQ/100ML IV SOLN
10.0000 meq | INTRAVENOUS | Status: AC
  Administered 2019-07-05 (×4): 10 meq via INTRAVENOUS
  Filled 2019-07-05 (×4): qty 100

## 2019-07-05 MED ORDER — METOPROLOL TARTRATE 25 MG PO TABS
25.0000 mg | ORAL_TABLET | Freq: Once | ORAL | Status: AC
  Administered 2019-07-05: 18:00:00 25 mg via ORAL
  Filled 2019-07-05: qty 1

## 2019-07-05 MED ORDER — DEXTROSE 5 % IV SOLN: INTRAVENOUS | Status: AC

## 2019-07-05 NOTE — TOC Progression Note (Signed)
Transition of Care [facility]) - Progression Note    Patient Details  Name: Jeffery [name] MRN: [id number] Date of Birth: 07/29/1960  [facility] CM/SW Contact  Lennart Pall, LCSW Phone Number: 07/05/2019, 11:28 AM  Clinical Narrative:  Have spoken with pt's mother who confirms that d/c plan is SNF as she and other family cannot meet his care needs. Will proceed with SNF transfer when medically cleared to do so.     Expected Discharge Plan: Skilled Nursing Facility Barriers to Discharge: SNF Pending bed offer  Expected Discharge Plan and Services Expected Discharge Plan: [place]   Discharge Planning Services: CM Consult Post Acute Care Choice: [place] Living arrangements for the past 2 months: Single Family Home                                       Social Determinants of Health (SDOH) Interventions    Readmission Risk Interventions No flowsheet data found.

## 2019-07-05 NOTE — Progress Notes (Addendum)
Referring Physician(s): Hall,C/Bell,E  Supervising Physician: Markus Daft  Patient Status:  [facility] - In-pt  Chief Complaint:  Bilateral hydronephrosis, prostate cancer  Subjective: Pt resting quietly, denies sig pain; currently receiving blood transfusion   Allergies: Patient has no known allergies.  Medications: Prior to Admission medications   Medication Sig Start Date End Date Taking? Authorizing Provider  Amino Acids-Protein Hydrolys (FEEDING SUPPLEMENT, PRO-STAT SUGAR FREE 64,) LIQD Take 30 mLs by mouth 3 (three) times daily. 10/14/18  Yes Hongalgi, Lenis Dickinson, MD  feeding supplement, ENSURE ENLIVE, (ENSURE ENLIVE) LIQD Take 237 mLs by mouth daily. 10/14/18  Yes Hongalgi, Lenis Dickinson, MD  finasteride (PROSCAR) 5 MG tablet Take 5 mg by mouth daily. 02/12/18  Yes [provider]  furosemide (LASIX) 40 MG tablet Take 40 mg by mouth daily.   Yes [provider]  gabapentin (NEURONTIN) 300 MG capsule Take 1-2 capsules (300-600 mg total) by mouth 3 (three) times daily. 375m in am and 6021min evening Patient taking differently: Take 600 mg by mouth 3 (three) times daily.  05/31/19  Yes Shadad, FiMathis DadMD  HYDROcodone-acetaminophen (NORCO/VICODIN) 5-325 MG tablet TAKE 1 TO 2 TABLETS BY MOUTH EVERY 4 TO 6 HOURS AS NEEDED FOR PAIN (MAX OF 8 TABLETS IN 24 HOURS) Patient taking differently: Take 1-2 tablets by mouth every 4 (four) hours as needed for moderate pain or severe pain. TAKE 1 TO 2 TABLETS BY MOUTH EVERY 4 TO 6 HOURS AS NEEDED FOR PAIN (MAX OF 8 TABLETS IN 24 HOURS) 06/15/19  Yes ShWyatt PortelaMD  metFORMIN (GLUCOPHAGE-XR) 500 MG 24 hr tablet Take 500-1,000 mg by mouth 2 (two) times daily. Take 2 tablets (1000 mg) in the am and Take 1 tablet at bedtime 01/12/19  Yes [provider]  Multiple Vitamin (MULTIVITAMIN WITH MINERALS) TABS tablet Take 1 tablet by mouth daily. 10/15/18  Yes Hongalgi, AnLenis DickinsonMD  predniSONE (DELTASONE) 5 MG tablet Take 1 tablet (5 mg  total) by mouth daily with breakfast. 11/03/18  Yes Shadad, FiMathis DadMD  tamsulosin (FLOMAX) 0.4 MG CAPS capsule Take 2 capsules (0.8 mg total) by mouth daily after supper. 10/14/18  Yes Hongalgi, AnLenis DickinsonMD  vitamin B-12 (CYANOCOBALAMIN) 1000 MCG tablet Take 1 tablet (1,000 mcg total) by mouth daily. 10/14/18  Yes Hongalgi, AnLenis DickinsonMD  ZYTIGA 250 MG tablet TAKE 4 TABLETS BY MOUTH ONCE DAILY AS DIRECTED.  TAKE 1 HOUR BEFORE OR 2 HOURS AFTER A MEAL Patient taking differently: Take 1,000 mg by mouth daily.  12/18/18  Yes ShWyatt PortelaMD  Blood Glucose Monitoring Suppl ([employer]ION CONFIRM GLUCOSE MONITOR) w/Device KIT Use 4 times daily before meals and bedtime as directed. 07/31/16   StHolley RaringMD  glucose blood (RELION GLUCOSE TEST STRIPS) test strip Use as instructed 07/31/16   StHolley RaringMD  Lancets 30G MISC Use 4 times daily before meals as directed. 07/31/16   StHolley RaringMD     Vital Signs: BP 124/89   Pulse (!) 118   Temp 98.1 F (36.7 C) (Oral)   Resp 19   Ht 5' 9"  (1.753 m)   Wt 215 lb 9.8 oz (97.8 kg)   SpO2 100%   BMI 31.84 kg/m   Physical Exam awake, answer few simple questions with pauses; bilat PCN's intact, sites NT;  OP left-8.7 liters yellow urine ; right -150 cc bloody urine; rt PCN flushed without difficulty  Imaging: ECHO TEE  Result Date: 07/13/2019  TRANSESOPHOGEAL ECHO REPORT   Patient Name:   Jeffery [name] Date of Exam: 07/10/2019 Medical Rec #:  [id number]           Height:       69.0 in Accession #:    [id number]          Weight:       226.0 lb Date of Birth:  Nov 16, 1960           BSA:          2.176 m Patient Age:    59 years            BP:           132/75 mmHg Patient Gender: M                   HR:           114 bpm. Exam Location:  Inpatient Procedure: Transesophageal Echo Indications:     Bacteremia  History:         Patient has prior history of Echocardiogram examinations, most                  recent 06/22/2019. Risk Factors:Diabetes, Hypertension  and                  Former Smoker. CKD.  Sonographer:     Clayton Lefort RDCS (AE) Referring Phys:  [id number] Abigail Butts Diagnosing Phys: Buford Dresser MD PROCEDURE: After discussion of the risks and benefits of a TEE, an informed consent was obtained from the patient. The transesophogeal probe was passed without difficulty through the esophogus of the patient. Sedation performed by different physician. The patient was monitored while under deep sedation. Anesthestetic sedation was provided intravenously by Anesthesiology: 200.50m of Propofol. Image quality was adequate. The patient developed no complications during the procedure. IMPRESSIONS  1. Left ventricular ejection fraction, by estimation, is 50 to 55%. The left ventricle has low normal function. The left ventricle has no regional wall motion abnormalities. Left ventricular diastolic function could not be evaluated.  2. Right ventricular systolic function is normal. The right ventricular size is normal. There is normal pulmonary artery systolic pressure.  3. No left atrial/left atrial appendage thrombus was detected.  4. The mitral valve is normal in structure. Trivial mitral valve regurgitation. No evidence of mitral stenosis.  5. The aortic valve is tricuspid. Aortic valve regurgitation is not visualized. No aortic stenosis is present.  6. There is mild (Grade II) plaque involving the descending aorta. Conclusion(s)/Recommendation(s): No evidence of vegetation/infective endocarditis on this transesophageal echocardiogram. FINDINGS  Left Ventricle: Left ventricular ejection fraction, by estimation, is 50 to 55%. The left ventricle has low normal function. The left ventricle has no regional wall motion abnormalities. The left ventricular internal cavity size was normal in size. There is no left ventricular hypertrophy. Left ventricular diastolic function could not be evaluated. Right Ventricle: The right ventricular size is normal. No increase in  right ventricular wall thickness. Right ventricular systolic function is normal. There is normal pulmonary artery systolic pressure. The tricuspid regurgitant velocity is 1.86 m/s, and  with an assumed right atrial pressure of 8 mmHg, the estimated right ventricular systolic pressure is 235.3mmHg. Left Atrium: Left atrial size was not assessed. No left atrial/left atrial appendage thrombus was detected. Right Atrium: Right atrial size was not assessed. Pericardium: Trivial pericardial effusion is present. Mitral Valve: The mitral valve is normal in structure. Trivial mitral valve regurgitation. No  evidence of mitral valve stenosis. There is no evidence of mitral valve vegetation. Tricuspid Valve: The tricuspid valve is normal in structure. Tricuspid valve regurgitation is trivial. No evidence of tricuspid stenosis. There is no evidence of tricuspid valve vegetation. Aortic Valve: The aortic valve is tricuspid. Aortic valve regurgitation is not visualized. No aortic stenosis is present. There is no evidence of aortic valve vegetation. Pulmonic Valve: The pulmonic valve was normal in structure. Pulmonic valve regurgitation is not visualized. No evidence of pulmonic stenosis. There is no evidence of pulmonic valve vegetation. Aorta: The aortic root is normal in size and structure. There is mild (Grade II) plaque involving the descending aorta. IAS/Shunts: No atrial level shunt detected by color flow Doppler. There is no evidence of a patent foramen ovale.  TRICUSPID VALVE TR Peak grad:   13.8 mmHg TR Vmax:        186.00 cm/s Buford Dresser MD Electronically signed by Buford Dresser MD Signature Date/Time: 07/05/2019/2:08:54 PM    Final    IR NEPHROSTOMY PLACEMENT LEFT  Result Date: 07/03/2019 INDICATION: 59 year old male with metastatic prostate cancer, bilateral obstructed hydronephrosis and acute kidney injury. He requires placement of bilateral percutaneous nephrostomy tubes. A thoracentesis was  also requested based off of CT imaging from 06/29/2019. However, when the patient was evaluated with real-time ultrasound, there is only trace pleural fluid which was insufficient for drainage. Therefore, thoracentesis was deferred. EXAM: IR NEPHROSTOMY PLACEMENT LEFT; IR NEPHROSTOMY PLACEMENT RIGHT COMPARISON:  None. MEDICATIONS: Patient currently admitted as an inpatient receiving intravenous antibiotics. No additional antibiotic prophylaxis was administered. ANESTHESIA/SEDATION: Fentanyl 100 mcg IV; Versed 2 mg IV Moderate Sedation Time:  37 minutes The patient was continuously monitored during the procedure by the interventional radiology nurse under my direct supervision. CONTRAST:  20 mL Isovue-300-administered into the collecting system(s) FLUOROSCOPY TIME:  Fluoroscopy Time: 2 minutes 12 seconds (22 mGy). COMPLICATIONS: None immediate. TECHNIQUE: The procedure, risks, benefits, and alternatives were explained to the patient. Questions regarding the procedure were encouraged and answered. The patient understands and consents to the procedure. The left flank was prepped with chlorhexidine in a sterile fashion, and a sterile drape was applied covering the operative field. A sterile gown and sterile gloves were used for the procedure. Local anesthesia was provided with 1% Lidocaine. The left flank was interrogated with ultrasound and the left kidney identified. The kidney is hydronephrotic. A suitable access site on the skin overlying the lower pole, posterior calix was identified. After local mg anesthesia was achieved, a small skin nick was made with an 11 blade scalpel. A 21 gauge Accustick needle was then advanced under direct sonographic guidance into the lower pole of the left kidney. A 0.018 inch wire was advanced under fluoroscopic guidance into the left renal collecting system. The Accustick sheath was then advanced over the wire and a 0.018 system exchanged for a 0.035 system. Gentle hand injection of  contrast material confirms placement of the sheath within the renal collecting system. There is marked hydronephrosis. The tract from the scan into the renal collecting system was then dilated serially to 10-French. A 10-French Cook all-purpose drain was then placed and positioned under fluoroscopic guidance. The locking loop is well formed within the left renal pelvis. The catheter was secured to the skin with 2-0 Prolene and a sterile bandage was placed. Catheter was left to gravity bag drainage. Using the exact same technique, a 10.2 French percutaneous nephrostomy catheter was placed in the right kidney via a posterior lower pole calyx. Contrast  injection into the renal collecting system demonstrates marked hydronephrosis. Both catheters were secured to the skin with 0 Prolene suture and connected to gravity bag drainage. The patient tolerated the procedure well. IMPRESSION: Successful placement of a bilateral 10 French percutaneous nephrostomy tubes. PLAN: 1. Maintain tubes to gravity bag drainage.  Follow output. 2. Return Interventional Radiology in 8 weeks for bilateral nephrostomy tube exchange. Electronically Signed   By: Jacqulynn Cadet M.D.   On: 07/03/2019 10:19   IR NEPHROSTOMY PLACEMENT RIGHT  Result Date: 07/03/2019 INDICATION: 59 year old male with metastatic prostate cancer, bilateral obstructed hydronephrosis and acute kidney injury. He requires placement of bilateral percutaneous nephrostomy tubes. A thoracentesis was also requested based off of CT imaging from 06/29/2019. However, when the patient was evaluated with real-time ultrasound, there is only trace pleural fluid which was insufficient for drainage. Therefore, thoracentesis was deferred. EXAM: IR NEPHROSTOMY PLACEMENT LEFT; IR NEPHROSTOMY PLACEMENT RIGHT COMPARISON:  None. MEDICATIONS: Patient currently admitted as an inpatient receiving intravenous antibiotics. No additional antibiotic prophylaxis was administered.  ANESTHESIA/SEDATION: Fentanyl 100 mcg IV; Versed 2 mg IV Moderate Sedation Time:  37 minutes The patient was continuously monitored during the procedure by the interventional radiology nurse under my direct supervision. CONTRAST:  20 mL Isovue-300-administered into the collecting system(s) FLUOROSCOPY TIME:  Fluoroscopy Time: 2 minutes 12 seconds (22 mGy). COMPLICATIONS: None immediate. TECHNIQUE: The procedure, risks, benefits, and alternatives were explained to the patient. Questions regarding the procedure were encouraged and answered. The patient understands and consents to the procedure. The left flank was prepped with chlorhexidine in a sterile fashion, and a sterile drape was applied covering the operative field. A sterile gown and sterile gloves were used for the procedure. Local anesthesia was provided with 1% Lidocaine. The left flank was interrogated with ultrasound and the left kidney identified. The kidney is hydronephrotic. A suitable access site on the skin overlying the lower pole, posterior calix was identified. After local mg anesthesia was achieved, a small skin nick was made with an 11 blade scalpel. A 21 gauge Accustick needle was then advanced under direct sonographic guidance into the lower pole of the left kidney. A 0.018 inch wire was advanced under fluoroscopic guidance into the left renal collecting system. The Accustick sheath was then advanced over the wire and a 0.018 system exchanged for a 0.035 system. Gentle hand injection of contrast material confirms placement of the sheath within the renal collecting system. There is marked hydronephrosis. The tract from the scan into the renal collecting system was then dilated serially to 10-French. A 10-French Cook all-purpose drain was then placed and positioned under fluoroscopic guidance. The locking loop is well formed within the left renal pelvis. The catheter was secured to the skin with 2-0 Prolene and a sterile bandage was placed.  Catheter was left to gravity bag drainage. Using the exact same technique, a 10.2 French percutaneous nephrostomy catheter was placed in the right kidney via a posterior lower pole calyx. Contrast injection into the renal collecting system demonstrates marked hydronephrosis. Both catheters were secured to the skin with 0 Prolene suture and connected to gravity bag drainage. The patient tolerated the procedure well. IMPRESSION: Successful placement of a bilateral 10 French percutaneous nephrostomy tubes. PLAN: 1. Maintain tubes to gravity bag drainage.  Follow output. 2. Return Interventional Radiology in 8 weeks for bilateral nephrostomy tube exchange. Electronically Signed   By: Jacqulynn Cadet M.D.   On: 07/03/2019 10:19    Labs:  CBC: Recent Labs    07/01/2019  0214 06/22/2019 0214 07/03/19 0142 07/04/19 0509 07/05/19 0436 07/05/19 0739  WBC 7.8  --  8.4 8.2 7.6  --   HGB 7.9*   < > 7.8* 7.6* 6.8* 7.2*  HCT 27.3*   < > 28.1* 28.4* 25.2* 26.8*  PLT 196  --  173 153 153  --    < > = values in this interval not displayed.    COAGS: Recent Labs    07/03/19 0142  INR 1.3*    BMP: Recent Labs    07/04/19 2242 07/05/19 0046 07/05/19 0436 07/05/19 0651  NA 157* 156* 156* 157*  K 3.5 3.4* 3.6 3.5  CL 120* 120* 121* 120*  CO2 27 27 25 28   GLUCOSE 223* 176* 143* 140*  BUN 52* 49* 48* 48*  CALCIUM 10.4* 10.5* 10.5* 10.7*  CREATININE 2.84* 2.80* 2.66* 2.76*  GFRNONAA 24* 24* 26* 25*  GFRAA 27* 28* 30* 28*    LIVER FUNCTION TESTS: Recent Labs    06/26/19 0210 06/27/19 0317 07/01/19 0209 07/05/19 0701  BILITOT 0.5 0.8 0.3 0.3  AST 42* 40 35 35  ALT 26 26 21 26   ALKPHOS 144* 158* 145* 149*  PROT 6.0* 5.9* 5.5* 6.1*  ALBUMIN 1.6* 1.7* 1.5* 1.5*    Assessment and Plan: Patient with history of metastatic prostate cancer, bilateral obstructive hydronephrosis, acute kidney injury.  Status post placement of bilateral nephrostomies on 07/03/2019; afebrile, remains slightly  tachy.  BP okay, latest hemoglobin 7.2, sodium 157, creatinine 2.76 (2.66); flush right nephrostomy twice daily with 5 cc sterile normal saline until urine clears- if urine remains bloody consider f/u nephrostogram; monitor labs closely; receiving transfusion; additional plans as per [facility]/urology. Will need routine bilateral nephrostomy tube exchanges in 8 weeks.   Electronically Signed: D. Rowe Robert, PA-C 07/05/2019, 9:56 AM   I spent a total of 15 minutes at the the patient's bedside AND on the patient's hospital floor or unit, greater than 50% of which was counseling/coordinating care for bilateral percutaneous nephrostomies    Patient ID: Jeffery [name], male   DOB: Apr 23, 1960, 59 y.o.   MRN: [id number]

## 2019-07-05 NOTE — Progress Notes (Signed)
PHARMACY NOTE:  ANTIMICROBIAL RENAL DOSAGE ADJUSTMENT  Current antimicrobial regimen includes a mismatch between antimicrobial dosage and estimated renal function.  As per policy approved by the Pharmacy & Therapeutics and Medical Executive Committees, the antimicrobial dosage will be adjusted accordingly.  Current antimicrobial dosage:  Ampicillin 2g IV q8h  Indication: Enterococcal bacteremia  Renal Function:  Estimated Creatinine Clearance: 34.5 mL/min (A) (by C-G formula based on SCr of 2.76 mg/dL (H)). []      On intermittent HD, scheduled: []      On CRRT    Antimicrobial dosage has been changed to:  2g IV q6h  Additional comments:  Thank you for allowing pharmacy to be a part of this patient's care.  Peggyann Juba, PharmD, BCPS 07/05/2019 1:50 PM

## 2019-07-05 NOTE — Progress Notes (Addendum)
Subjective:  Fevers improved over weekend  Antibiotics:  Anti-infectives (From admission, onward)   Start     Dose/Rate Route Frequency Ordered Stop   07/05/19 2000  ampicillin (OMNIPEN) 2 g in sodium chloride 0.9 % 100 mL IVPB     2 g 300 mL/hr over 20 Minutes Intravenous Every 6 hours 07/05/19 1350     07/04/19 1200  azithromycin (ZITHROMAX) 500 mg in sodium chloride 0.9 % 250 mL IVPB     500 mg 250 mL/hr over 60 Minutes Intravenous  Once 07/04/19 1119 07/04/19 1300   06/30/19 0615  cefTRIAXone (ROCEPHIN) 2 g in sodium chloride 0.9 % 100 mL IVPB  Status:  Discontinued     2 g 200 mL/hr over 30 Minutes Intravenous Daily 06/30/19 0541 07/04/19 1119   06/30/19 0615  azithromycin (ZITHROMAX) 500 mg in sodium chloride 0.9 % 250 mL IVPB  Status:  Discontinued     500 mg 250 mL/hr over 60 Minutes Intravenous Daily 06/30/19 0542 07/04/19 1119   06/27/19 1800  cefTRIAXone (ROCEPHIN) 2 g in sodium chloride 0.9 % 100 mL IVPB  Status:  Discontinued     2 g 200 mL/hr over 30 Minutes Intravenous Every 24 hours 06/27/19 1635 06/28/19 1426   07/05/2019 1400  ampicillin (OMNIPEN) 2 g in sodium chloride 0.9 % 100 mL IVPB  Status:  Discontinued     2 g 300 mL/hr over 20 Minutes Intravenous Every 8 hours 06/27/2019 0612 07/05/19 1350   07/05/2019 0115  ampicillin (OMNIPEN) 2 g in sodium chloride 0.9 % 100 mL IVPB  Status:  Discontinued     2 g 300 mL/hr over 20 Minutes Intravenous Every 6 hours 06/30/2019 0107 06/27/2019 0611   06/24/19 0800  cefTRIAXone (ROCEPHIN) 2 g in sodium chloride 0.9 % 100 mL IVPB  Status:  Discontinued     2 g 200 mL/hr over 30 Minutes Intravenous Every 24 hours 06/24/19 0722 06/27/2019 0104   06/28/2019 1700  cefTRIAXone (ROCEPHIN) 1 g in sodium chloride 0.9 % 100 mL IVPB     1 g 200 mL/hr over 30 Minutes Intravenous  Once 06/21/2019 1652 07/14/2019 1949      Medications: Scheduled Meds: . Chlorhexidine Gluconate Cloth  6 each Topical Daily  . feeding supplement (ENSURE  ENLIVE)  237 mL Oral TID BM  . feeding supplement (PRO-STAT SUGAR FREE 64)  30 mL Oral BID  . gabapentin  100 mg Oral BID  . mouth rinse  15 mL Mouth Rinse BID  . metoprolol tartrate  25 mg Oral BID  . multivitamin with minerals  1 tablet Oral Daily  . pantoprazole  40 mg Oral Q0600  . protein supplement  1 Scoop Oral TID WC   Continuous Infusions: . sodium chloride 350 mL/hr at 07/05/19 1500  . ampicillin (OMNIPEN) IV    . dextrose 150 mL/hr at 07/05/19 1500  . insulin 3.4 mL/hr at 07/05/19 1500   PRN Meds:.[DISCONTINUED] acetaminophen **OR** acetaminophen, acetaminophen, dextrose, fentaNYL (SUBLIMAZE) injection, metoprolol tartrate, ondansetron **OR** ondansetron (ZOFRAN) IV, oxyCODONE, sodium chloride    Objective: Weight change:   Intake/Output Summary (Last 24 hours) at 07/05/2019 1523 Last data filed at 07/05/2019 1500 Gross per 24 hour  Intake 14230.84 ml  Output 9325 ml  Net 4905.84 ml   Blood pressure (!) 174/91, pulse (!) 117, temperature 99 F (37.2 C), temperature source Oral, resp. rate (!) 28, height 5\' 9"  (1.753 m), weight 97.8 kg, SpO2 98 %. Temp:  [  98.1 F (36.7 C)-100.7 F (38.2 C)] 99 F (37.2 C) (03/22 1424) Pulse Rate:  [107-137] 117 (03/22 1500) Resp:  [10-39] 28 (03/22 1500) BP: (98-174)/(49-94) 174/91 (03/22 1424) SpO2:  [83 %-100 %] 98 % (03/22 1500)  Physical Exam: General: alert but confused HEENT: anicteric sclera, EOMI CVS tachycardic   Chest: , no wheezing, no respiratory distress Abdomen: soft non-distended,  Extremities: edematous Skin: no rashes Neuro: nonfocal  CBC:    BMET Recent Labs    07/05/19 0651 07/05/19 1345  NA 157* 153*  K 3.5 4.0  CL 120* 119*  CO2 28 25  GLUCOSE 140* 190*  BUN 48* 46*  CREATININE 2.76* 2.52*  CALCIUM 10.7* 10.4*     Liver Panel  Recent Labs    07/05/19 0701  PROT 6.1*  ALBUMIN 1.5*  AST 35  ALT 26  ALKPHOS 149*  BILITOT 0.3  BILIDIR <0.1  IBILI NOT CALCULATED        Sedimentation Rate No results for input(s): ESRSEDRATE in the last 72 hours. C-Reactive Protein No results for input(s): CRP in the last 72 hours.  Micro Results: Recent Results (from the past 720 hour(s))  Urine culture     Status: Abnormal   Collection Time: 07/09/2019  3:37 PM   Specimen: Urine, Random  Result Value Ref Range Status   Specimen Description   Final    URINE, RANDOM Performed at [facility] [address], [place], [place] [address]    Special Requests   Final    NONE Performed at [facility], [place] [address], [place], [place] [address]    Culture MULTIPLE SPECIES PRESENT, SUGGEST RECOLLECTION (A)  Final   Report Status 06/24/2019 FINAL  Final  Culture, blood (routine x 2)     Status: Abnormal   Collection Time: 07/11/2019  4:56 PM   Specimen: BLOOD  Result Value Ref Range Status   Specimen Description   Final    BLOOD RIGHT ANTECUBITAL Performed at [facility] [address] [place], [place] [address]    Special Requests   Final    BOTTLES DRAWN AEROBIC ONLY Blood Culture results may not be optimal due to an excessive volume of blood received in culture bottles Performed at [facility] [address] [place], [place] [address]    Culture  Setup Time   Final    AEROBIC BOTTLE ONLY GRAM POSITIVE COCCI IN CHAINS CRITICAL RESULT CALLED TO, READ BACK BY AND VERIFIED WITH: Lavell Luster [facility] 07/06/2019 0032 JDW Performed at [facility], [place] [address], [place], [place] [address]    Culture ENTEROCOCCUS FAECALIS (A)  Final   Report Status 06/26/2019 FINAL  Final   Organism ID, Bacteria ENTEROCOCCUS FAECALIS  Final      Susceptibility   Enterococcus faecalis - MIC*    AMPICILLIN <=2 SENSITIVE Sensitive     VANCOMYCIN 1 SENSITIVE Sensitive     GENTAMICIN SYNERGY SENSITIVE Sensitive     * ENTEROCOCCUS FAECALIS  Blood Culture ID Panel (Reflexed)     Status: Abnormal    Collection Time: 06/30/2019  4:56 PM  Result Value Ref Range Status   Enterococcus species DETECTED (A) NOT DETECTED Final    Comment: CRITICAL RESULT CALLED TO, READ BACK BY AND VERIFIED WITH: J. GRIMSLEY PHARMD 07/06/2019 0032 JDW    Vancomycin resistance NOT DETECTED NOT DETECTED Final   Listeria monocytogenes NOT DETECTED NOT DETECTED Final   Staphylococcus species NOT DETECTED NOT  DETECTED Final   Staphylococcus aureus (BCID) NOT DETECTED NOT DETECTED Final   Streptococcus species NOT DETECTED NOT DETECTED Final   Streptococcus agalactiae NOT DETECTED NOT DETECTED Final   Streptococcus pneumoniae NOT DETECTED NOT DETECTED Final   Streptococcus pyogenes NOT DETECTED NOT DETECTED Final   Acinetobacter baumannii NOT DETECTED NOT DETECTED Final   Enterobacteriaceae species NOT DETECTED NOT DETECTED Final   Enterobacter cloacae complex NOT DETECTED NOT DETECTED Final   Escherichia coli NOT DETECTED NOT DETECTED Final   Klebsiella oxytoca NOT DETECTED NOT DETECTED Final   Klebsiella pneumoniae NOT DETECTED NOT DETECTED Final   Proteus species NOT DETECTED NOT DETECTED Final   Serratia marcescens NOT DETECTED NOT DETECTED Final   Haemophilus influenzae NOT DETECTED NOT DETECTED Final   Neisseria meningitidis NOT DETECTED NOT DETECTED Final   Pseudomonas aeruginosa NOT DETECTED NOT DETECTED Final   Candida albicans NOT DETECTED NOT DETECTED Final   Candida glabrata NOT DETECTED NOT DETECTED Final   Candida krusei NOT DETECTED NOT DETECTED Final   Candida parapsilosis NOT DETECTED NOT DETECTED Final   Candida tropicalis NOT DETECTED NOT DETECTED Final    Comment: Performed at [facility], [place] [address], [place], [place] [address]  Culture, blood (routine x 2)     Status: Abnormal   Collection Time: 06/15/2019  5:16 PM   Specimen: BLOOD  Result Value Ref Range Status   Specimen Description   Final    BLOOD RIGHT ANTECUBITAL Performed at [facility]  [address], [place], [place] [address]    Special Requests   Final    BOTTLES DRAWN AEROBIC AND ANAEROBIC Blood Culture adequate volume Performed at [facility] [address] [place], [place] [address]    Culture  Setup Time   Final    GRAM POSITIVE COCCI IN CHAINS IN BOTH AEROBIC AND ANAEROBIC BOTTLES CRITICAL RESULT CALLED TO, READ BACK BY AND VERIFIED WITH: PHARMD M RENZ [id number] [phone]    Culture (A)  Final    ENTEROCOCCUS FAECALIS SUSCEPTIBILITIES PERFORMED ON PREVIOUS CULTURE WITHIN THE LAST 5 DAYS. Performed at [facility], [place] [address], [place], [place] [address]    Report Status 06/26/2019 FINAL  Final  SARS CORONAVIRUS 2 (TAT 6-24 HRS) Nasopharyngeal Nasopharyngeal Swab     Status: None   Collection Time: 07/09/2019  5:20 PM   Specimen: Nasopharyngeal Swab  Result Value Ref Range Status   SARS Coronavirus 2 NEGATIVE NEGATIVE Final    Comment: (NOTE) SARS-CoV-2 target nucleic acids are NOT DETECTED. The SARS-CoV-2 RNA is generally detectable in upper and lower respiratory specimens during the acute phase of infection. Negative results do not preclude SARS-CoV-2 infection, do not rule out co-infections with other pathogens, and should not be used as the sole basis for treatment or other patient management decisions. Negative results must be combined with clinical observations, patient history, and epidemiological information. The expected result is Negative. Fact Sheet for Patients: SugarRoll.be Fact Sheet for Healthcare Providers: https://www.woods-mathews.com/ This test is not yet approved or cleared by the [place] FDA and  has been authorized for detection and/or diagnosis of SARS-CoV-2 by FDA under an Emergency Use Authorization (EUA). This EUA will remain  in effect (meaning this test can be used) for the duration of the COVID-19 declaration under Section 56 4(b)(1) of the Act, 21  U.S.C. section 360bbb-3(b)(1), unless the authorization is terminated or revoked sooner. Performed at [facility], [place],  [place] [address]   MRSA PCR Screening     Status: None   Collection Time: 06/24/19  1:08 AM   Specimen: Nasal Mucosa; Nasopharyngeal  Result Value Ref Range Status   MRSA by PCR NEGATIVE NEGATIVE Final    Comment:        The GeneXpert MRSA Assay (FDA approved for NASAL specimens only), is one component of a comprehensive MRSA colonization surveillance program. It is not intended to diagnose MRSA infection nor to guide or monitor treatment for MRSA infections. Performed at [facility], [place] [address], [place], [place] [address]   Culture, blood (routine x 2)     Status: None   Collection Time: 06/26/19  2:10 AM   Specimen: BLOOD  Result Value Ref Range Status   Specimen Description   Final    BLOOD RIGHT ARM Performed at [facility] [address] [place], [place] [address]    Special Requests   Final    BOTTLES DRAWN AEROBIC ONLY Blood Culture adequate volume Performed at [facility] [address], [place], [place] [address]    Culture   Final    NO GROWTH 5 DAYS Performed at [facility], [place] [address] [place], [place] [address]    Report Status 07/01/2019 FINAL  Final  Culture, blood (routine x 2)     Status: None   Collection Time: 06/26/19  2:10 AM   Specimen: BLOOD  Result Value Ref Range Status   Specimen Description   Final    BLOOD RIGHT HAND Performed at [facility] [address] [place], [place] [address]    Special Requests   Final    BOTTLES DRAWN AEROBIC ONLY Blood Culture adequate volume Performed at [facility] [address] [place], [place] [address]    Culture   Final    NO GROWTH 5 DAYS Performed at [facility], [place] [name] [place], [place] [address]    Report Status  07/01/2019 FINAL  Final    Studies/Results: No results found.    Assessment/Plan:  INTERVAL HISTORY:   Sp bilateral PCN tubes No thoracentesis TEE negative   Principal Problem:   Severe sepsis (HCC) Active Problems:   Urinary retention   AKI (acute kidney injury) ([place])   Cholelithiasis   Diabetes mellitus, new onset (HCC)   DM2 (diabetes mellitus, type 2) (HCC)   CKD (chronic kidney disease) stage 3, GFR 30-59 ml/min   Prostate cancer (HCC)   GI bleed   Heme positive stool   Anemia due to chronic blood loss   Anemia   Enterococcal bacteremia   Palliative care by specialist   Goals of care, counseling/discussion   Metabolic encephalopathy   Pleural effusion    Jeffery [name] is a 59 y.o. male with progressive metastatic prostate cancer bilateral hydronephrosis and obstructive uropathy with AMP S enterococcal bacteremia, thought to be from urinary source  #1 AMP S E faecalis bacteremia: Likely from urinary source.  Transthoracic echocardiogram does not show evidence of vegetations  .TEE negative  He needs only 14 days of therapy from date of negative blood cultures which puts stop date at March 26th  #2 Pneumonia: sp 5 days of abx and can stop other abx  #3 Fevers:  Seem much better.  Not clear what was driving this. He seems improved fever wise post PCN though still with some fevers. Possible his malignancy driving part of this though not  involving liver clearly.  #4   Progressive metastatic prostate cancer: Agree with goals of care being continue to be discussed and continued conversations with palliative care  I will sign off for now please call with further questions   LOS: 12 days   Alcide Evener 07/05/2019, 3:23 PM

## 2019-07-05 NOTE — Progress Notes (Signed)
SLP received order for swallow evaluation, will see 3/23 am.    Kathleen Lime, MS [place] Office [phone]

## 2019-07-05 NOTE — Progress Notes (Signed)
VAST consulted to obtain 3rd IV access for blood transfusion. PIV placed utiliizing ultrasound as charted. Educated Loss adjuster, chartered per [employer], this patient would benefit from a central line. Pt has been in house for 11 days, and continues to receive numerous IV fluids and medications. Requested unit RN speak to physician about obtaining best IV access for this patient.

## 2019-07-05 NOTE — Progress Notes (Signed)
PROGRESS NOTE  Jeffery [name] QQI:[id number] DOB: Jul 09, 1960 DOA: 06/30/2019 PCP: [name], MD  HPI/Recap of past 24 hours: Patient is a 59 year old gentleman with PMH of stage IV prostate cancer with mets to bones, type II diabetes, polyneuropathy, hyperlipidemia, chronic kidney disease stage IIIB, anemia of chronic disease, chronic guaiac positive stools who presented to the emergency room with generalized weakness, hypoxia and tachycardia, painful urination.   He was working with therapies at home, he was found shaky and tremulous, oncology office was called who directed him to the ER.  Febrile tachycardic in the ED.  Hemoglobin 6.4.  Transfused 2 unit PRBCs.  Creatinine up to 3.54.   CT renal 3/10 showed invasive prostate cancer into the bladder with severe bilateral hydronephrosis.  A Foley catheter was inserted.  TRH was asked to admit.  Repeated imaging showed persistent severe bilateral hydronephrosis.  Urology and nephrology recommended bilateral nephrostomy tube placement by interventional radiology.  Patient declined initially then agreed to proceed on 07/03/19.  Per oncology his cancer is not curable.  Per Nephrology, not a good candidate for hemodialysis.  Due to poor prognosis palliative care team was consulted and is following.  Hospital course complicated by intermittent high grade fevers, suspected hematuria and positive FOBT with Hg drop down to 7.1.  Transfused 1U PRBC on 3/14, Hg up to 8.8 post RBC transfusion. Dropped again and transfused 1U PRBC on 3/17.  Received IV lasix due to volume overload with acute hypoxia on 3/14-3/15.  Responded well with improvement of hypoxia. IV lasix dced on 3/15 due to rise in creatinine and soft BPs.  Restarted IV fluid on 3/16 by nephrology for 2 days.  IV azithromycin and Rocephin were added on 06/30/2019 due to concern for possible pneumonia, Covered empirically for community-acquired pneumonia, completed 5 days on 3/21.  Repeated  blood cultures x 2 06/26/19 Negative Final.  TEE completed on 06/14/2019 no evidence of vegetation/endocarditis.  Patient agreeable to proceed with nephrostomy tube placement by IR on 07/03/2019.  No thoracentesis done due to no adequate amount of fluid.  3/20 with acute hypovolemic hypernatremia.  Restarted on IV fluid D5W.  Na+ continued to trend up to 166.  Nephrology Dr. Jonnie Finner re-consulted.  Suspect urinary losses, putting out quite a bit of urine.  Started IV fluids D5W and 1/2NS as recommended by nephro with improvement of his hypernatremia.  Started insulin drip to avoid worsening of his hypernatremia with glycosuria.   07/05/19:  Seen and examined.  Alert an minimally interactive.  Moves all limbs.     Assessment/Plan: Principal Problem:   Severe sepsis (HCC) Active Problems:   Urinary retention   AKI (acute kidney injury) ([place])   Cholelithiasis   Diabetes mellitus, new onset ([place])   DM2 (diabetes mellitus, type 2) (HCC)   CKD (chronic kidney disease) stage 3, GFR 30-59 ml/min   Prostate cancer (HCC)   GI bleed   Heme positive stool   Anemia due to chronic blood loss   Anemia   Enterococcal bacteremia   Palliative care by specialist   Goals of care, counseling/discussion   Metabolic encephalopathy   Pleural effusion  Sepsis, present on admission suspect from urinary source, Enterococcus bacteremia and possibly CAP, POA. Leukocytosis and neutrophilia have resolved. Still spikes fever intermittently. He is on ampicillin for Proteus Bacteremia Completed 5 days of IV azithromycin/Rocephin to cover CAP. Repeated blood cultures x2 have been negative final. ID following  Acute hypovolemic hypernatremia suspect 2/2 to urinary losses Has had  lots of output from urinary foley and left percutaneous nephrostomy drain Serum sodium increased to 167 now trending down Continue fluid as recommended by nephrology Frequent BMP as recommended by nephrology.  Acute blood loss  anemia Suspect multifactorial dilutional and also some blood loss from the right nephrostomy drain Hemoglobin dropped to 6.8 this morning Transfused 2 unit PRBCs Repeat CBC or H&H posttransfusion Continue to monitor H&H  Severe bilateral hydronephrosis in the setting of obstruction from progressive enlarging prostate mass Post B/L nephrostomy tube placement by IR 3/20 Managed by IR; We greatly appreciate assistance.  Acute kidney injury on chronic kidney disease stage IIIB likely multifactorial postrenal and prerenal 2/2 to obstructive uropathy from prostate cancer and intravascular volume depletion:  Baseline creatinine appears to be 2.0 with GFR of 36 Presented with creatinine up to 3.8 Creatinine peaked at 3.82.  Now trending down to 2.52. Continue fluid per nephrology Continue to avoid nephrotoxins Indwelling Foley catheter daily #11  Enterococcus bacteremia from 2/2 positive bottles Management as per above Repeated blood cultures negative final. TEE negative for endocarditis. Colonoscopy once the patient is hemodynamically stable. On ampicillin, duration per ID rec.  Refractory sinus tachycardia/fevers likely multifactorial 2/2 to sepsis, malignancy, versus intravascular volume depletion versus others ST on 12 lead EKG Heart rate in the 120s-130s P.o. metoprolol increased to 25 mg twice daily. Continue IV lopressor 2.5 mg q3H prn with HR>130 when MAP>70 Last TSH normal 1.9 on 05/27/2019, repeat also normal Bilateral lower extremity Doppler ultrasound negative for DVT on 06/28/2019  Acute hypoxic respiratory failure secondary to pulmonary edema, atelectasis Not on oxygen supplementation at baseline Continue to maintain O2 saturation greater than 92% Encourage incentive spirometer to minimize fluid accumulation in the lungs.  Resolved hypokalemia post repletion Potassium 3.9>> 4.0.  Severe protein calorie malnutrition Albumin 1.5 BMI 32 with loss of muscle mass Continue  to encourage increase in protein calorie intake Dietitian following, appreciate recommendations. Core track placement if patient is agreeable.  Resolved prolonged QTC 12 lead EKG on 3/12 QTC >540 Repeated 12 lead EKG done on 3/14 QTc 353  Left lower extremity pain, DVT ruled out. Chronic left lower extremity weakness with worsening pain Bilateral Doppler ultrasound negative for DVT.  Severe physical debility Continue PT OT with assistance and fall precaution as tolerated Encourage increase protein calorie intake and mobilize as tolerated  Hyperphosphatemia in the setting of acute renal failure Phosphorous 5.1 Deferred management to nephrology  Anemia of chronic disease, positive FOBT and mild hematuria:  Received 2 units of PRBC.  Hg 7.2>>7.6> 7.1>> 7.9>> 7.8>> 7.6>> 6,8 Transfused 1 unit PRBC on 06/27/2019 Transfused 1 U PRBC 06/30/19 Transfused 2 unit PRBCs 07/05/2019  Type 2 diabetes with hyperglycemia exacerbated by D5W fluid started for hypernatremia:  A1C 6.6 on 06/24/19.   On insulin drip.  Metastatic prostate cancer on Zytiga, bladder obstruction, debility and chronic anemia: Followed by his oncologist.  Will have outpatient follow-up after stabilization. Zytiga on hold  Goals of care Palliative care following  DVT prophylaxis: DVT prophylaxis with SCDs Code Status: Full code  Family Communication:  Updated the patient's mother via phone 3/20.  Disposition Plan: patient is from home. Anticipated DC to home once interventional radiology and infectious disease have signed off, Barriers to discharge active treatment for severe hypovolemic hypernatremia, hemodynamic instability.  Consultants:   Urology  GI  Oncology  Palliative medicine  Nephrology  Cardiology  Dietitian  Interventional radiology  Infectious disease  Procedures:   Foley cath insertion  Objective: Vitals:   07/05/19 1403 07/05/19 1405 07/05/19 1424 07/05/19 1500  BP:   (!) 141/79 (!) 174/91   Pulse: (!) 113  (!) 114 (!) 117  Resp: (!) 23  (!) 39 (!) 28  Temp: (!) 100.7 F (38.2 C)  99 F (37.2 C)   TempSrc: Oral  Oral   SpO2: 99%  94% 98%  Weight:      Height:        Intake/Output Summary (Last 24 hours) at 07/05/2019 1525 Last data filed at 07/05/2019 1500 Gross per 24 hour  Intake 14230.84 ml  Output 9325 ml  Net 4905.84 ml   Filed Weights   06/15/2019 0500 07/03/19 0500 07/04/19 0500  Weight: 102.5 kg 99 kg 97.8 kg    Exam:  . General: 59 y.o. year-old male well-developed well-nourished no acute distress.  Alert and minimally interactive. . Cardiovascular: Tachycardic with no rubs or gallops.   [name] Respiratory: Mild rales at bases.  No wheezing noted.  Poor inspiratory effort.  . Abdomen: Obese nontender normal bowel sounds present. . Musculoskeletal: No lower extremity edema bilaterally.   [name] Psychiatry: Mood is appropriate for condition and setting.  Data Reviewed: CBC: Recent Labs  Lab 07/01/19 0209 07/01/19 0209 06/14/2019 0214 07/03/19 0142 07/04/19 0509 07/05/19 0436 07/05/19 0739  WBC 7.0  --  7.8 8.4 8.2 7.6  --   NEUTROABS 5.0  --  5.5 5.8 5.5 5.0  --   HGB 7.4*   < > 7.9* 7.8* 7.6* 6.8* 7.2*  HCT 26.3*   < > 27.3* 28.1* 28.4* 25.2* 26.8*  MCV 92.9  --  92.5 94.9 95.9 98.4  --   PLT 156  --  196 173 153 153  --    < > = values in this interval not displayed.   Basic Metabolic Panel: Recent Labs  Lab 06/29/19 1054 06/30/19 0156 07/03/19 0142 07/03/19 1652 07/04/19 2242 07/05/19 0046 07/05/19 0436 07/05/19 0651 07/05/19 1345  NA  --    < > 153*   < > 157* 156* 156* 157* 153*  K  --    < > 3.9   < > 3.5 3.4* 3.6 3.5 4.0  CL  --    < > 117*   < > 120* 120* 121* 120* 119*  CO2  --    < > 26   < > 27 27 25 28 25   GLUCOSE  --    < > 182*   < > 223* 176* 143* 140* 190*  BUN  --    < > 50*   < > 52* 49* 48* 48* 46*  CREATININE  --    < > 3.20*   < > 2.84* 2.80* 2.66* 2.76* 2.52*  CALCIUM  --    < > 9.7   < > 10.4*  10.5* 10.5* 10.7* 10.4*  MG  --   --  2.1  --   --   --   --   --   --   PHOS 5.1*  --  4.2  --   --   --   --   --   --    < > = values in this interval not displayed.   GFR: Estimated Creatinine Clearance: 37.7 mL/min (A) (by C-G formula based on SCr of 2.52 mg/dL (H)). Liver Function Tests: Recent Labs  Lab 07/01/19 0209 07/05/19 0701  AST 35 35  ALT 21 26  ALKPHOS 145* 149*  BILITOT 0.3 0.3  PROT 5.5* 6.1*  ALBUMIN 1.5* 1.5*   No results for input(s): LIPASE, AMYLASE in the last 168 hours. No results for input(s): AMMONIA in the last 168 hours. Coagulation Profile: Recent Labs  Lab 07/03/19 0142  INR 1.3*   Cardiac Enzymes: No results for input(s): CKTOTAL, CKMB, CKMBINDEX, TROPONINI in the last 168 hours. BNP (last 3 results) No results for input(s): PROBNP in the last 8760 hours. HbA1C: No results for input(s): HGBA1C in the last 72 hours. CBG: Recent Labs  Lab 07/05/19 0742 07/05/19 0837 07/05/19 0941 07/05/19 1046 07/05/19 1251  GLUCAP 135* 159* 157* 156* 175*   Lipid Profile: No results for input(s): CHOL, HDL, LDLCALC, TRIG, CHOLHDL, LDLDIRECT in the last 72 hours. Thyroid Function Tests: Recent Labs    07/03/19 1652  TSH 2.218   Anemia Panel: No results for input(s): VITAMINB12, FOLATE, FERRITIN, TIBC, IRON, RETICCTPCT in the last 72 hours. Urine analysis:    Component Value Date/Time   COLORURINE YELLOW 06/25/2019 1537   APPEARANCEUR CLEAR 06/24/2019 1537   LABSPEC 1.006 06/25/2019 1537   PHURINE 6.0 07/07/2019 1537   GLUCOSEU NEGATIVE 06/14/2019 1537   HGBUR MODERATE (A) 06/16/2019 1537   BILIRUBINUR NEGATIVE 07/05/2019 1537   KETONESUR NEGATIVE 06/19/2019 1537   PROTEINUR 30 (A) 07/10/2019 1537   NITRITE NEGATIVE 06/14/2019 1537   LEUKOCYTESUR LARGE (A) 06/19/2019 1537   Sepsis Labs: @LABRCNTIP (procalcitonin:4,lacticidven:4)  ) Recent Results (from the past 240 hour(s))  Culture, blood (routine x 2)     Status: None   Collection  Time: 06/26/19  2:10 AM   Specimen: BLOOD  Result Value Ref Range Status   Specimen Description   Final    BLOOD RIGHT ARM Performed at [facility], [place] [address] [place], [place] [address]    Special Requests   Final    BOTTLES DRAWN AEROBIC ONLY Blood Culture adequate volume Performed at [facility] [address], [place], [place] [address]    Culture   Final    NO GROWTH 5 DAYS Performed at [facility], [place] [address], [place], [place] [address]    Report Status 07/01/2019 FINAL  Final  Culture, blood (routine x 2)     Status: None   Collection Time: 06/26/19  2:10 AM   Specimen: BLOOD  Result Value Ref Range Status   Specimen Description   Final    BLOOD RIGHT HAND Performed at [facility] [address] [place], [place] [address]    Special Requests   Final    BOTTLES DRAWN AEROBIC ONLY Blood Culture adequate volume Performed at [facility] [address] [place], [place] [address]    Culture   Final    NO GROWTH 5 DAYS Performed at [facility], [place] [address], [place], [place] [address]    Report Status 07/01/2019 FINAL  Final      Studies: No results found.  Scheduled Meds: . Chlorhexidine Gluconate Cloth  6 each Topical Daily  . feeding supplement (ENSURE ENLIVE)  237 mL Oral TID BM  . feeding supplement (PRO-STAT SUGAR FREE 64)  30 mL Oral BID  . gabapentin  100 mg Oral BID  . mouth rinse  15 mL Mouth Rinse BID  . metoprolol tartrate  25 mg Oral BID  . multivitamin with minerals  1 tablet Oral Daily  . pantoprazole  40 mg Oral Q0600  . protein supplement  1 Scoop Oral TID WC  Continuous Infusions: . sodium chloride 350 mL/hr at 07/05/19 1500  . ampicillin (OMNIPEN) IV    . dextrose 150 mL/hr at 07/05/19 1500  . insulin 3.4 mL/hr at 07/05/19 1500     LOS: 12 days     Kayleen Memos, MD [facility] Pager [phone]  If 7PM-7AM, please  contact night-coverage [email or web address] Password [facility] 07/05/2019, 3:25 PM

## 2019-07-05 NOTE — Progress Notes (Signed)
[facility] Progress Note  Subjective: is lethargic and disoriented, but able to answer simple questions. Na+ down 156 this am.  9.0 L uop yesterady, I/O yest were net neg 1L approx.   Vitals:   07/05/19 0800 07/05/19 0900 07/05/19 0932 07/05/19 0953  BP:  (!) 133/94 124/89 137/81  Pulse:  (!) 125 (!) 118 (!) 120  Resp:  18 19 (!) 26  Temp: 98.1 F (36.7 C)  98.1 F (36.7 C) 98.1 F (36.7 C)  TempSrc: Oral  Oral Axillary  SpO2:  100% 100% 100%  Weight:      Height:        Exam: Gen gen weak, looks older than stated age No rash, cyanosis or gangrene Sclera anicteric, throat clear and moist  No jvd or bruits Chest clear bilat to bases RRR no MRG Abd soft ntnd no mass or ascites +bs Bilat PCN tubes one draining clear yellow urine, the other lesser amts of urine MS no joint effusions or deformity Ext no sig LE or UE edema, no wounds or ulcers Neuro is alert, confused, nonfocal, gen weak    Home meds:  - metformin 1gm bid  - flomax 0.8 qd/ poscar 5mg   - prednisone 5 qam  - lasix 40 qd  - neurontin 600 tid/ hydrocodone prn qid  - prn's/ vitamins/ supplements    UA 3/10  >50 wbc, 6-10 rbc, prot 30    Wt on admit 98 kg, today is 97.5kg  UNa 18, UCr 25  Assessment/ Plan: 1. Hypernatremia - hypovolemia due to post-obstructive diuresis. We are replacing all UOP now w/ 1/2 NS to avoid vol depletion, also repleting large H2O deficit w/ D5W at 190 cc/hr.  Na+ better at 156 today and should improve another 12 pts by tomorrow this time.  2. Bilat ureteral obstruction/ severe hydro - from localized prostate mets in pelvis. Is now SP bilat PCN done on 3/20. Baseline creat 2.0 from 2020, has been 3- 3.5 here prior to bilat PCN placement, now improving post bilat PCN to 2.7 today. Will follow.  3. Enterococcus bacteremia - on ampicillin 4. CAP - on azithro/ rocephin IV 5. Debility - multifactorial 6. DM2, uncont- may need IV insulin, ^BS may be contributing to  extra-normal diuresis 7. Metastatic prostate Ca - on Nobie Putnam 07/05/2019, 10:40 AM   Recent Labs  Lab 06/29/19 1054 06/30/19 0156 07/03/19 0142 07/03/19 1858 07/05/19 0436 07/05/19 0651 07/05/19 0739  K  --    < > 3.9   < > 3.6 3.5  --   BUN  --    < > 50*   < > 48* 48*  --   CREATININE  --    < > 3.20*   < > 2.66* 2.76*  --   CALCIUM  --    < > 9.7   < > 10.5* 10.7*  --   PHOS 5.1*  --  4.2  --   --   --   --   HGB 7.5*   < > 7.8*   < > 6.8*  --  7.2*   < > = values in this interval not displayed.   Inpatient medications: . Chlorhexidine Gluconate Cloth  6 each Topical Daily  . feeding supplement (ENSURE ENLIVE)  237 mL Oral TID BM  . feeding supplement (PRO-STAT SUGAR FREE 64)  30 mL Oral BID  . gabapentin  100 mg Oral BID  . mouth rinse  15  mL Mouth Rinse BID  . metoprolol tartrate  25 mg Oral BID  . multivitamin with minerals  1 tablet Oral Daily  . pantoprazole  40 mg Oral Q0600  . protein supplement  1 Scoop Oral TID WC   . sodium chloride 385 mL/hr at 07/05/19 1006  . ampicillin (OMNIPEN) IV Stopped (07/05/19 0211)  . dextrose 190 mL/hr at 07/05/19 0739  . insulin 1.1 mL/hr at 07/05/19 1552   [DISCONTINUED] acetaminophen **OR** acetaminophen, acetaminophen, dextrose, fentaNYL (SUBLIMAZE) injection, metoprolol tartrate, ondansetron **OR** ondansetron (ZOFRAN) IV, oxyCODONE, sodium chloride

## 2019-07-05 NOTE — Progress Notes (Signed)
PT Cancellation Note  Patient Details Name: Jeffery [name] MRN: [id number] DOB: [birth year]   Cancelled Treatment:      Patient To get blood today . Will check back as schedule allows.   Claretha Cooper 07/05/2019, 1:54 PM [place] Pager [phone] Office [phone]

## 2019-07-06 ENCOUNTER — Inpatient Hospital Stay (HOSPITAL_COMMUNITY): Payer: Medicaid Other

## 2019-07-06 LAB — GLUCOSE, CAPILLARY
Glucose-Capillary: 133 mg/dL — ABNORMAL HIGH (ref 70–99)
Glucose-Capillary: 145 mg/dL — ABNORMAL HIGH (ref 70–99)
Glucose-Capillary: 146 mg/dL — ABNORMAL HIGH (ref 70–99)
Glucose-Capillary: 156 mg/dL — ABNORMAL HIGH (ref 70–99)
Glucose-Capillary: 130 mg/dL — ABNORMAL HIGH (ref 70–99)
Glucose-Capillary: 131 mg/dL — ABNORMAL HIGH (ref 70–99)
Glucose-Capillary: 125 mg/dL — ABNORMAL HIGH (ref 70–99)
Glucose-Capillary: 138 mg/dL — ABNORMAL HIGH (ref 70–99)
Glucose-Capillary: 139 mg/dL — ABNORMAL HIGH (ref 70–99)
Glucose-Capillary: 139 mg/dL — ABNORMAL HIGH (ref 70–99)
Glucose-Capillary: 140 mg/dL — ABNORMAL HIGH (ref 70–99)
Glucose-Capillary: 141 mg/dL — ABNORMAL HIGH (ref 70–99)
Glucose-Capillary: 142 mg/dL — ABNORMAL HIGH (ref 70–99)
Glucose-Capillary: 141 mg/dL — ABNORMAL HIGH (ref 70–99)
Glucose-Capillary: 137 mg/dL — ABNORMAL HIGH (ref 70–99)
Glucose-Capillary: 145 mg/dL — ABNORMAL HIGH (ref 70–99)
Glucose-Capillary: 138 mg/dL — ABNORMAL HIGH (ref 70–99)
Glucose-Capillary: 133 mg/dL — ABNORMAL HIGH (ref 70–99)

## 2019-07-06 LAB — BASIC METABOLIC PANEL
Anion gap: 10 (ref 5–15)
BUN: 43 mg/dL — ABNORMAL HIGH (ref 6–20)
CO2: 26 mmol/L (ref 22–32)
Calcium: 10.4 mg/dL — ABNORMAL HIGH (ref 8.9–10.3)
Chloride: 117 mmol/L — ABNORMAL HIGH (ref 98–111)
Creatinine, Ser: 2.29 mg/dL — ABNORMAL HIGH (ref 0.61–1.24)
GFR calc Af Amer: 36 mL/min — ABNORMAL LOW (ref >60)
GFR calc non Af Amer: 31 mL/min — ABNORMAL LOW (ref >60)
Glucose, Bld: 152 mg/dL — ABNORMAL HIGH (ref 70–99)
Potassium: 3.8 mmol/L (ref 3.5–5.1)
Sodium: 153 mmol/L — ABNORMAL HIGH (ref 135–145)

## 2019-07-06 LAB — BPAM RBC
Blood Product Expiration Date: 202104212359
Blood Product Expiration Date: 202104212359
ISSUE DATE / TIME: 202103220926
ISSUE DATE / TIME: 202103221359
Unit Type and Rh: 5100
Unit Type and Rh: 5100

## 2019-07-06 LAB — CBC WITH DIFFERENTIAL/PLATELET
Abs Immature Granulocytes: 0.55 10*3/uL — ABNORMAL HIGH (ref 0.00–0.07)
Basophils Absolute: 0 10*3/uL (ref 0.0–0.1)
Basophils Relative: 0 %
Eosinophils Absolute: 0.2 10*3/uL (ref 0.0–0.5)
Eosinophils Relative: 2 %
HCT: 30.4 % — ABNORMAL LOW (ref 39.0–52.0)
Hemoglobin: 8.6 g/dL — ABNORMAL LOW (ref 13.0–17.0)
Immature Granulocytes: 7 %
Lymphocytes Relative: 20 %
Lymphs Abs: 1.7 10*3/uL (ref 0.7–4.0)
MCH: 26.8 pg (ref 26.0–34.0)
MCHC: 28.3 g/dL — ABNORMAL LOW (ref 30.0–36.0)
MCV: 94.7 fL (ref 80.0–100.0)
Monocytes Absolute: 0.5 10*3/uL (ref 0.1–1.0)
Monocytes Relative: 6 %
Neutro Abs: 5.4 10*3/uL (ref 1.7–7.7)
Neutrophils Relative %: 65 %
Platelets: 139 10*3/uL — ABNORMAL LOW (ref 150–400)
RBC: 3.21 MIL/uL — ABNORMAL LOW (ref 4.22–5.81)
RDW: 19.2 % — ABNORMAL HIGH (ref 11.5–15.5)
WBC: 8.3 10*3/uL (ref 4.0–10.5)
nRBC: 0.4 % — ABNORMAL HIGH (ref 0.0–0.2)

## 2019-07-06 LAB — COMPREHENSIVE METABOLIC PANEL
ALT: 27 U/L (ref 0–44)
AST: 46 U/L — ABNORMAL HIGH (ref 15–41)
Albumin: 1.5 g/dL — ABNORMAL LOW (ref 3.5–5.0)
Alkaline Phosphatase: 185 U/L — ABNORMAL HIGH (ref 38–126)
Anion gap: 10 (ref 5–15)
BUN: 40 mg/dL — ABNORMAL HIGH (ref 6–20)
CO2: 24 mmol/L (ref 22–32)
Calcium: 10.4 mg/dL — ABNORMAL HIGH (ref 8.9–10.3)
Chloride: 115 mmol/L — ABNORMAL HIGH (ref 98–111)
Creatinine, Ser: 2.25 mg/dL — ABNORMAL HIGH (ref 0.61–1.24)
GFR calc Af Amer: 36 mL/min — ABNORMAL LOW (ref >60)
GFR calc non Af Amer: 31 mL/min — ABNORMAL LOW (ref >60)
Glucose, Bld: 164 mg/dL — ABNORMAL HIGH (ref 70–99)
Potassium: 3.8 mmol/L (ref 3.5–5.1)
Sodium: 149 mmol/L — ABNORMAL HIGH (ref 135–145)
Total Bilirubin: 0.6 mg/dL (ref 0.3–1.2)
Total Protein: 5.7 g/dL — ABNORMAL LOW (ref 6.5–8.1)

## 2019-07-06 LAB — TYPE AND SCREEN
ABO/RH(D): O POS
Antibody Screen: NEGATIVE
Unit division: 0
Unit division: 0

## 2019-07-06 IMAGING — DX DG ABDOMEN 1V
1 series · 1 of 1 positions shown · non-contrast
Comparison: CT abdomen and pelvis [DATE]

CLINICAL DATA: NG tube placement.

EXAM:
ABDOMEN - 1 VIEW

[abdomen kub]
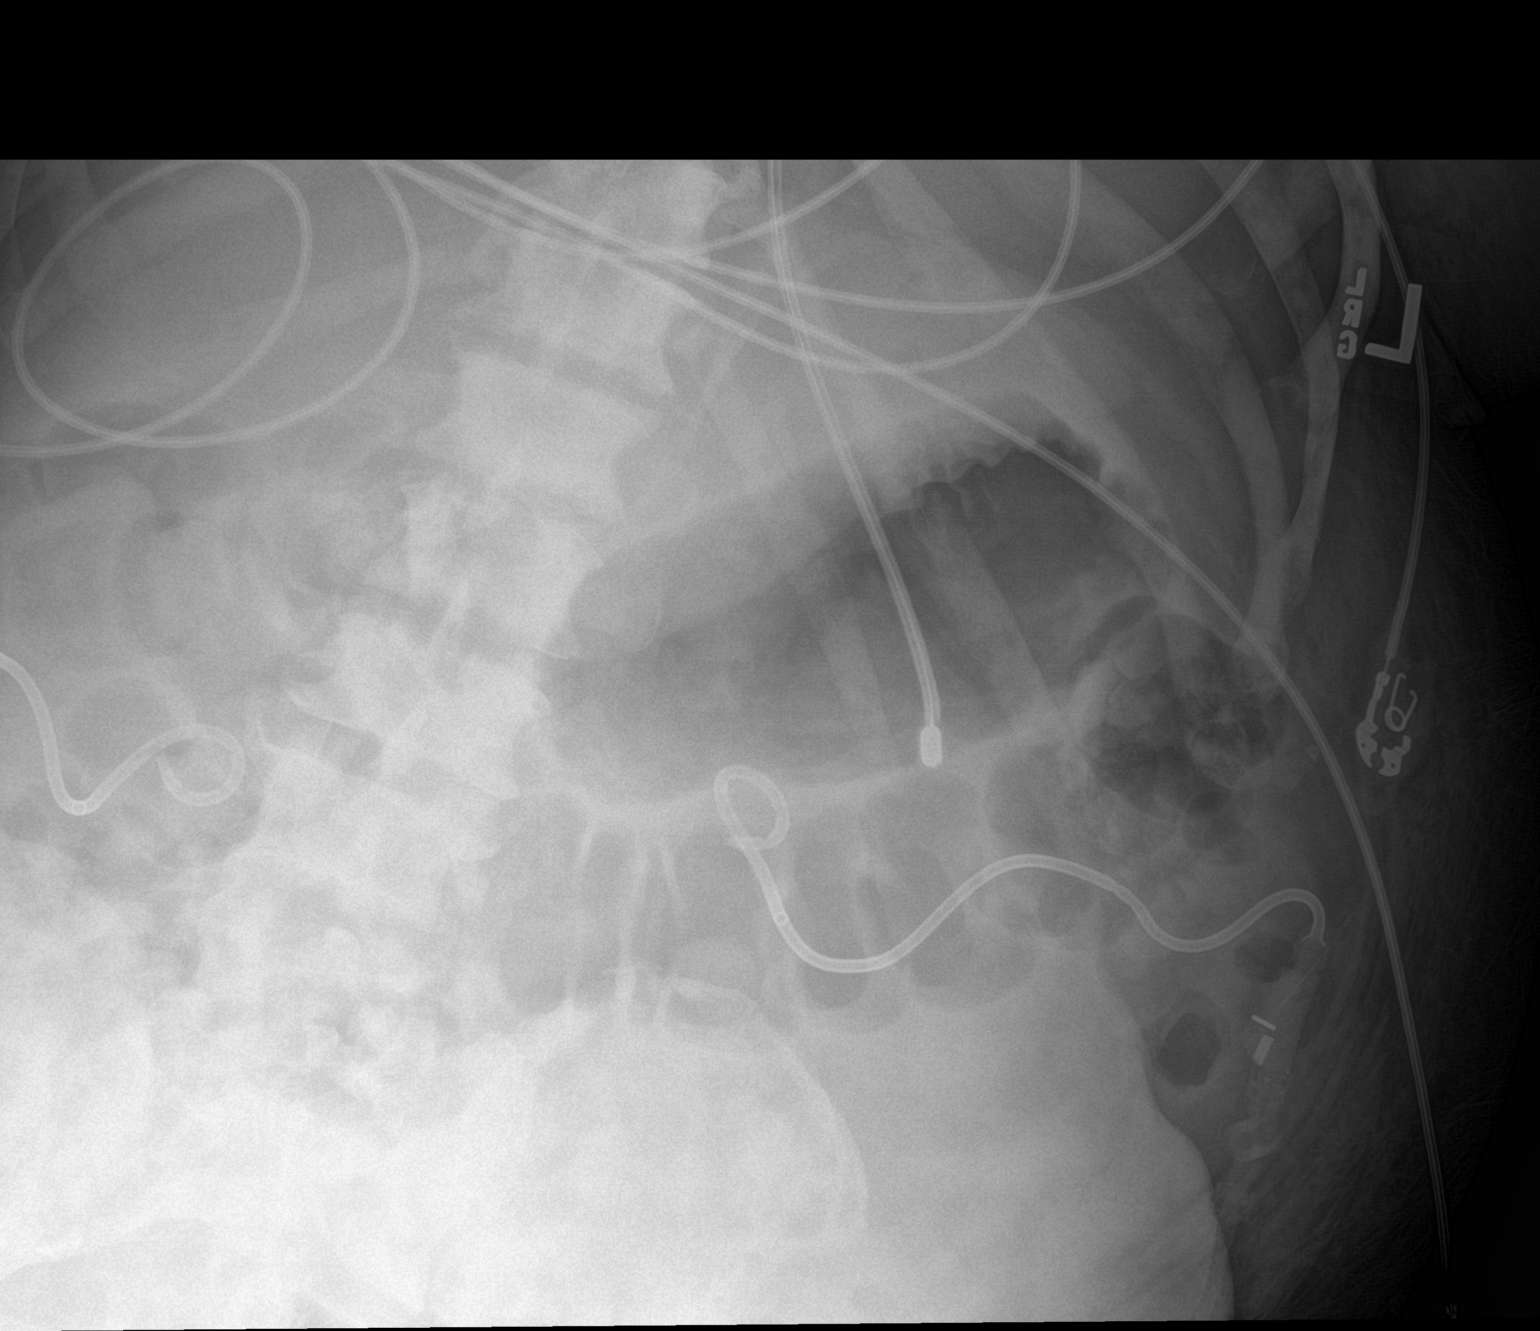

[1 of 1 positions shown; findings below may reference images not displayed]

FINDINGS: An enteric tube terminates over the proximal to mid gastric body.
Bilateral percutaneous nephrostomy tubes are present. No dilated
loops of bowel are seen to suggest obstruction although the right
lower abdomen and pelvis were not imaged. Widespread heterogeneous
sclerosis of the included osseous structures reflects known
metastatic prostate cancer.
IMPRESSION: Enteric tube in the gastric body.

## 2019-07-06 MED ORDER — SODIUM CHLORIDE 0.45 % IV SOLN: INTRAVENOUS | Status: AC

## 2019-07-06 MED ORDER — GABAPENTIN 250 MG/5ML PO SOLN
100.0000 mg | Freq: Two times a day (BID) | ORAL | Status: DC
  Administered 2019-07-06 – 2019-07-07 (×2): 100 mg
  Filled 2019-07-06 (×4): qty 2

## 2019-07-06 MED ORDER — ACETAMINOPHEN 160 MG/5ML PO SOLN
1000.0000 mg | Freq: Four times a day (QID) | ORAL | Status: DC | PRN
  Administered 2019-07-06 – 2019-07-07 (×3): 1000 mg
  Filled 2019-07-06 (×3): qty 40.6

## 2019-07-06 MED ORDER — METOPROLOL TARTRATE 5 MG/5ML IV SOLN
5.0000 mg | Freq: Four times a day (QID) | INTRAVENOUS | Status: DC
  Administered 2019-07-06 (×2): 5 mg via INTRAVENOUS
  Filled 2019-07-06 (×2): qty 5

## 2019-07-06 MED ORDER — PANTOPRAZOLE SODIUM 40 MG IV SOLR
40.0000 mg | INTRAVENOUS | Status: DC
  Administered 2019-07-06 – 2019-07-07 (×2): 40 mg via INTRAVENOUS
  Filled 2019-07-06 (×2): qty 40

## 2019-07-06 NOTE — Progress Notes (Signed)
PT Cancellation Note  Patient Details Name: Jeffery [name] MRN: [id number] DOB: December 26, 1960   Cancelled Treatment:    Reason Eval/Treat Not Completed: Medical issues which prohibited therapy, patient's status has declined since last visit. Will follow up another day.   Claretha Cooper 07/06/2019, 9:43 AM [place] Pager [phone] Office [phone]

## 2019-07-06 NOTE — Progress Notes (Signed)
Nutrition Follow-up  DOCUMENTATION CODES:   Obesity unspecified  INTERVENTION:  - continue orders for Ensure Enlive TID, 30 ml prostat BID, 1 scoop beneprotein TID, and Magic Cup with lunch and dinner meals.  - will monitor for ability to safely consume PO items. - recommend small bore NGT placement and initiation of TF to meet estimated nutrition needs.    NUTRITION DIAGNOSIS:   Increased nutrient needs related to acute illness, chronic illness, cancer and cancer related treatments as evidenced by estimated needs. -ongoing  GOAL:   Patient will meet greater than or equal to 90% of their needs -unmet  MONITOR:   PO intake, Supplement acceptance, Labs, Weight trends  ASSESSMENT:   59 year old gentleman with stage IV prostate cancer, type 2 DM, hyperlipidemia, chronic anemia, and stage 3 CKD. He presented to the ED with generalized weakness, hypoxia, tachycardia, and painful urination. He was working with therapy at home and was shaky, tremulous, and went to Oncologist's office who then directed him to the ED. CT showed invasive prostate cancer with bladder mets and severe bilateral hydronephrosis; Foley inserted.  Weight has been fairly stable since admission on 3/11. Per review of orders, he has been accepting Ensure 75% of the time offered, prostat 7% of the time offered, and beneprotein 50-75% of the time offered.   Per flow sheet review, he recently consumed the following at meals: 3/14- 25% of dinner 3/15- 25% of lunch, 25% of dinner 3/17- 25% of breakfast 3/18- 0% of lunch, 0% of dinner 3/21- 70% of dinner  SLP completed bedside swallow evaluation earlier this AM and recommended NPO with single ice chips and teaspoons of water. Patient is noted to be a/o to self only.   Per notes: - intermittent fever - leukocytosis and neutropenia--resolved - CAP s/p 5 days IV abx - acute hypovolemic hypernatremia  - acute blood loss anemia - severe bilateral hydronephrosis - AKI  on stage 3 CKD - pulmonary edema, atelectasis - LLE pain without DVT - severe physical debility - metastatic prostate cancer - Full Code    Labs reviewed; CBGs: 133 and 130 mg/dl, Na: 149 mmol/l, Cl: 115 mmol/l, BUN: 40 mg/dl, creatinine: 2.25 mg/dl, Ca: 10.4 mg/dl, Alk Phos elevated, GFR: 31 ml/min. Medications reviewed; daily multivitamin with minerals, 40 mg oral protonix/day, 10 mEq IV KCl x4 runs 3/22 IVF; D5 @ 150 ml/hr x48 hours (612 kcal/24 hours)   Diet Order:   Diet Order            Diet Carb Modified Fluid consistency: Thin; Room service appropriate? Yes  Diet effective now              EDUCATION NEEDS:   Not appropriate for education at this time  Skin:  Skin Assessment: Reviewed RN Assessment  Last BM:  3/23  Height:   Ht Readings from Last 1 Encounters:  06/24/19 _0  (1.753 m)    Weight:   Wt Readings from Last 1 Encounters:  07/04/19 97.8 kg    Ideal Body Weight:  72.7 kg  BMI:  Body mass index is 31.84 kg/m.  Estimated Nutritional Needs:   Kcal:  2500-2700 kcal  Protein:  125-140 grams  Fluid:  >/= 2.5     Jarome Matin, MS, RD, LDN, CNSC Inpatient Clinical Dietitian RD pager # available in AMION  After hours/weekend pager # available in [facility]

## 2019-07-06 NOTE — Progress Notes (Signed)
PROGRESS NOTE  Jeffery [name] QIO:[id number] DOB: 1960-05-28 DOA: 06/14/2019 PCP: [name], MD  HPI/Recap of past 24 hours: Patient is a 59 year old gentleman with PMH of stage IV prostate cancer with mets to bones, type II diabetes, polyneuropathy, hyperlipidemia, chronic kidney disease stage IIIB, anemia of chronic disease, chronic guaiac positive stools who presented to [facility] ED with generalized weakness, hypoxia and tachycardia, painful urination.   He was working with therapies at home, he was found shaky and tremulous, oncology office was called who directed him to the ER.  Febrile tachycardic in the ED.  Hemoglobin 6.4.  Transfused 2 unit PRBCs.  Creatinine up to 3.54.   CT renal 3/10 showed invasive prostate cancer into the bladder with severe bilateral hydronephrosis.  A Foley catheter was inserted.  TRH was asked to admit.  Urology consulted and followed.  Patient was reluctant to have nephrostomy tube placed and declined initially.  Repeated images showed persistent severe bilateral hydronephrosis despite indwelling Foley catheter.  Urology and nephrology again recommended bilateral nephrostomy tube placement by IR.  Patient declined at first then agreed after talking to his son.  They were placed on 07/03/2019 by IR.  Per oncology his cancer is not curable.  Per Nephrology, not a good candidate for hemodialysis.  Due to poor prognosis palliative care team was consulted and followed.  Blood cultures positive for Enterococcus faecalis x2 bottles on 07/13/2019, finalized on 06/26/2019, suspected from urinary source.  Pansensitive.  On IV ampicillin per infectious disease until 07/09/2019.  Hospital course complicated by intermittent high grade fevers, gross hematuria and positive FOBT with Hg drop down to 7.1.  Transfused 1U PRBC on 3/14, Hg up to 8.8 post RBC transfusion. Dropped again and transfused 1U PRBC on 3/17.  Received IV lasix due to volume overload with acute hypoxia on  3/14-3/15.  Responded well with improvement of hypoxia. IV lasix dced on 3/15 due to rise in creatinine and soft BPs.  Restarted IV fluid on 3/16 by nephrology for 2 days.  IV azithromycin and Rocephin were added on 06/30/2019 due to concern for possible pneumonia, Covered empirically for community-acquired pneumonia, completed 5 days on 3/21.  Repeated blood cultures x 2 06/26/19 Negative Final.  TEE completed on 06/23/2019 no evidence of vegetation/endocarditis.  Patient agreeable to proceed with nephrostomy tube placement by IR on 07/03/2019.  Small bilateral pleural effusions, no thoracentesis done due to no adequate amount of fluid.    07/03/19 with acute hypovolemic hypernatremia which worsened with serum sodium peaked at 167.  Restarted on IV fluid D5W.  Nephrology Dr. Jonnie Finner re-consulted.  Suspect urinary losses.  Currently on IV fluids D5W, 1/2NS and IV insulin as recommended by nephro with improvement.  Serum sodium is trending down.  Acute blood loss on 3/22, Hg dropped to 7.2 with bleeding from right nephrostomy tube, 2 units PRBCs transfused.  07/06/19: Seen and examined at his bedside.  Somnolent and minimally interactive.  Bloody output noted from right nephrostomy drain.  Gross hematuria noted from Foley catheter.  Evaluated by speech made n.p.o.  Medication switched to IV.  Plan for Cortrak placement.      Assessment/Plan: Principal Problem:   Severe sepsis (HCC) Active Problems:   Urinary retention   AKI (acute kidney injury) ([place])   Cholelithiasis   Diabetes mellitus, new onset ([place])   DM2 (diabetes mellitus, type 2) (HCC)   CKD (chronic kidney disease) stage 3, GFR 30-59 ml/min   Prostate cancer (HCC)   GI bleed  Heme positive stool   Anemia due to chronic blood loss   Anemia   Enterococcal bacteremia   Palliative care by specialist   Goals of care, counseling/discussion   Metabolic encephalopathy   Pleural effusion  Sepsis secondary to Enterococcus bacteremia and  possibly CAP, POA Leukocytosis and neutrophilia have resolved. Still spikes fever intermittently.  T-max 102 overnight. He is on ampicillin for Proteus Bacteremia, end date 07/09/2019. Completed 5 days of IV azithromycin/Rocephin to cover CAP. Repeated blood cultures x2 have been negative final. ID signed off  Acute hypovolemic hypernatremia suspect 2/2 to massive urinary losses Has had lots of output from urinary foley and left percutaneous nephrostomy drain Serum sodium increased to 167 now trending down Continue IV fluids as recommended by nephrology Frequent BMPs as recommended by nephrology.  Dysphagia Reassessed by speech therapist with recommendation for n.p.o., finger ice chips, and teaspoons water only when fully alert. Further work-up per speech therapy. Plan for cortrack placement on 07/06/2019 Aspiration precautions Elevate head of bed greater than 30 degrees  Acute blood loss anemia Suspect multifactorial secondary to blood loss from right nephrostomy tube, from Foley catheter with gross hematuria  Hemoglobin dropped to 6.8 (3/22) repeated 7.2. Transfused 2 unit PRBCs, repeated H&H 8.9 Drop in hemoglobin this morning down to 8.6. Bloody output from right nephrostomy tube noted and from Foley catheter. Continue to monitor H&H and repeat CBC in the morning  Severe bilateral hydronephrosis in the setting of obstruction from progressive enlarging prostate mass Post B/L nephrostomy tube placement by IR 3/20 Managed by IR; We greatly appreciate assistance. Seen by urology Dr. Gloriann Loan on 3/16  Acute kidney injury on chronic kidney disease stage IIIB likely multifactorial postrenal and prerenal 2/2 to obstructive uropathy from prostate cancer and intravascular volume depletion, improving with IV fluid replacement. Baseline creatinine appears to be 2.0 with GFR of 36 Presented with creatinine up to 3.8 Creatinine peaked at 3.82.  Now trending down to 2.25 on IV fluid 8.1 L urine  output in the last 24 hours Net I&O -12.0 L  indwelling Foley catheter day #12. Nephrology following, IV fluids adjusted by nephrology.  Enterococcus bacteremia from 2/2 positive bottles Management as per above Repeated blood cultures 06/26/2019 negative final. TEE negative for endocarditis. Colonoscopy when the patient is hemodynamically stable. On ampicillin x-ray 07/09/2019, from the time of negative repeated culture x14 days.  Refractory sinus tachycardia/fevers likely multifactorial 2/2 to sepsis, malignancy, versus intravascular volume depletion versus others ST on 12 lead EKG Heart rate in the 120s-130s N.p.o. Switch p.o. metoprolol to IV. Continue IV lopressor 2.5 mg q3H prn with HR>130 when MAP>70 Last TSH normal 1.9 on 05/27/2019, repeat also normal Bilateral lower extremity Doppler ultrasound negative for DVT on 06/28/2019  Refractory fevers Possibly secondary to malignancy Continue current management  Acute hypoxic respiratory failure secondary to pulmonary edema, atelectasis Not on oxygen supplementation at baseline Continue to maintain O2 saturation greater than 92% Encourage incentive spirometer to minimize fluid accumulation in the lungs.  Resolved hypokalemia post repletion Potassium 3.9>> 4.0>> 3.8  Severe protein calorie malnutrition Albumin 1.5 BMI 32 with loss of muscle mass NPO Cortrack Dietitian following  Resolved prolonged QTC 12 lead EKG on 3/12 QTC >540 Repeated 12 lead EKG done on 3/14 QTc 353  Left lower extremity pain, DVT ruled out. Chronic left lower extremity weakness with worsening pain Bilateral Doppler ultrasound negative for DVT.  Severe physical debility Continue PT OT with assistance and fall precaution as tolerated Encourage increase protein calorie intake  and mobilize as tolerated  C1 Lytic lesion From CT head 3/10: Sclerotic and mixed lytic changes of C1, the skull base and calvarium consistent with the patient's known  osseous metastatic disease. Monitor, avoid injury  Hyperphosphatemia in the setting of acute renal failure Phosphorous 5.1>> 4.2 Deferred management to nephrology  Anemia of chronic disease, positive FOBT and mild hematuria:  Received 2 units of PRBC.  Hg 7.2>>7.6> 7.1>> 7.9>> 7.8>> 7.6>> 6,8 Transfused 1 unit PRBC on 06/27/2019 Transfused 1 U PRBC 06/30/19 Transfused 2 unit PRBCs 07/05/2019 Hg 8.6  Type 2 diabetes with hyperglycemia exacerbated by D5W fluid started for hypernatremia:  A1C 6.6 on 06/24/19.   On insulin drip to avoid worsening urinary losses/hypernatremia from glycosuria.  Metastatic prostate cancer on [place], bladder obstruction, debility and chronic anemia: Followed by his oncologist.  Will have outpatient follow-up after stabilization. Zytiga on hold CT head 3/10 showed: Normal appearance of the brain itself. Known metastatic disease of the bone.  Goals of care Palliative care following  DVT prophylaxis: DVT prophylaxis with SCDs due to gross hematuria and acute blood losses  Code Status: Full code  Family Communication:  Updated the patient's mother via phone.  Disposition Plan: patient is from home. Anticipated DC to home once interventional radiology and nephrology have signed off, Barriers to discharge ongoing treatment for severe hypovolemic hypernatremia, hemodynamic instability, gross hematuria requiring blood transfusions.  Consultants:   Urology  GI  Oncology  Palliative medicine  Nephrology  Cardiology  Dietitian  Interventional radiology  Infectious disease  Procedures:   Foley cath insertion  B/L nephrostomy tube placement     Objective: Vitals:   07/06/19 0700 07/06/19 0800 07/06/19 0859 07/06/19 0900  BP:  134/65    Pulse: (!) 110 (!) 112 (!) 116 (!) 119  Resp: (!) 23 (!) 24 (!) 34 (!) 32  Temp:      TempSrc:      SpO2: 95% 99% 100% 100%  Weight:      Height:        Intake/Output Summary (Last 24 hours)  at 07/06/2019 0919 Last data filed at 07/06/2019 9675 Gross per 24 hour  Intake 13647.9 ml  Output 8165 ml  Net 5482.9 ml   Filed Weights   07/13/2019 0500 07/03/19 0500 07/04/19 0500  Weight: 102.5 kg 99 kg 97.8 kg    Exam:  . General: 60 y.o. year-old male well-developed well-nourished in no acute distress.  Somnolent but easily arousable.  Minimally interactive.  .  Cardiovascular: Tachycardic with no rubs or gallops. [name] Respiratory: Mild rales at bases no wheezing noted.  Poor inspiratory effort. . Abdomen: Obese nontender normal bowel sounds present. . Musculoskeletal: No lower extremity edema bilaterally.   [name] Psychiatry: Unable to assess mood due to somnolence.   Data Reviewed: CBC: Recent Labs  Lab 06/30/2019 0214 07/12/2019 0214 07/03/19 0142 07/03/19 0142 07/04/19 9163 07/05/19 0436 07/05/19 0739 07/05/19 1902 07/06/19 0032  WBC 7.8  --  8.4  --  8.2 7.6  --   --  8.3  NEUTROABS 5.5  --  5.8  --  5.5 5.0  --   --  5.4  HGB 7.9*   < > 7.8*   < > 7.6* 6.8* 7.2* 8.9* 8.6*  HCT 27.3*   < > 28.1*   < > 28.4* 25.2* 26.8* 31.8* 30.4*  MCV 92.5  --  94.9  --  95.9 98.4  --   --  94.7  PLT 196  --  173  --  153 153  --   --  139*   < > = values in this interval not displayed.   Basic Metabolic Panel: Recent Labs  Lab 06/29/19 1054 06/30/19 0156 07/03/19 0142 07/03/19 1652 07/05/19 1345 07/05/19 1902 07/05/19 2118 07/06/19 0032 07/06/19 0721  NA  --    < > 153*   < > 153* 155* 151* 153* 149*  K  --    < > 3.9   < > 4.0 4.0 3.8 3.8 3.8  CL  --    < > 117*   < > 119* 118* 117* 117* 115*  CO2  --    < > 26   < > 25 25 24 26 24   GLUCOSE  --    < > 182*   < > 190* 165* 167* 152* 164*  BUN  --    < > 50*   < > 46* 44* 41* 43* 40*  CREATININE  --    < > 3.20*   < > 2.52* 2.26* 2.33* 2.29* 2.25*  CALCIUM  --    < > 9.7   < > 10.4* 10.6* 10.1 10.4* 10.4*  MG  --   --  2.1  --   --   --   --   --   --   PHOS 5.1*  --  4.2  --   --   --   --   --   --    < > = values in  this interval not displayed.   GFR: Estimated Creatinine Clearance: 42.3 mL/min (A) (by C-G formula based on SCr of 2.25 mg/dL (H)). Liver Function Tests: Recent Labs  Lab 07/01/19 0209 07/05/19 0701 07/06/19 0721  AST 35 35 46*  ALT 21 26 27   ALKPHOS 145* 149* 185*  BILITOT 0.3 0.3 0.6  PROT 5.5* 6.1* 5.7*  ALBUMIN 1.5* 1.5* 1.5*   No results for input(s): LIPASE, AMYLASE in the last 168 hours. No results for input(s): AMMONIA in the last 168 hours. Coagulation Profile: Recent Labs  Lab 07/03/19 0142  INR 1.3*   Cardiac Enzymes: No results for input(s): CKTOTAL, CKMB, CKMBINDEX, TROPONINI in the last 168 hours. BNP (last 3 results) No results for input(s): PROBNP in the last 8760 hours. HbA1C: No results for input(s): HGBA1C in the last 72 hours. CBG: Recent Labs  Lab 07/05/19 2144 07/05/19 2243 07/06/19 0040 07/06/19 0339 07/06/19 0804  GLUCAP 145* 146* 133* 130* 131*   Lipid Profile: No results for input(s): CHOL, HDL, LDLCALC, TRIG, CHOLHDL, LDLDIRECT in the last 72 hours. Thyroid Function Tests: Recent Labs    07/03/19 1652  TSH 2.218   Anemia Panel: No results for input(s): VITAMINB12, FOLATE, FERRITIN, TIBC, IRON, RETICCTPCT in the last 72 hours. Urine analysis:    Component Value Date/Time   COLORURINE YELLOW 07/10/2019 1537   APPEARANCEUR CLEAR 07/09/2019 1537   LABSPEC 1.006 06/21/2019 1537   PHURINE 6.0 07/13/2019 1537   GLUCOSEU NEGATIVE 07/02/2019 1537   HGBUR MODERATE (A) 06/26/2019 1537   BILIRUBINUR NEGATIVE 06/16/2019 1537   KETONESUR NEGATIVE 06/18/2019 1537   PROTEINUR 30 (A) 06/26/2019 1537   NITRITE NEGATIVE 07/04/2019 1537   LEUKOCYTESUR LARGE (A) 06/27/2019 1537   Sepsis Labs: @LABRCNTIP (procalcitonin:4,lacticidven:4)  ) No results found for this or any previous visit (from the past 240 hour(s)).    Studies: No results found.  Scheduled Meds: . Chlorhexidine Gluconate Cloth  6 each Topical Daily  . feeding supplement  (ENSURE ENLIVE)  237  mL Oral TID BM  . feeding supplement (PRO-STAT SUGAR FREE 64)  30 mL Oral BID  . gabapentin  100 mg Oral BID  . mouth rinse  15 mL Mouth Rinse BID  . metoprolol tartrate  5 mg Intravenous Q6H  . multivitamin with minerals  1 tablet Oral Daily  . pantoprazole (PROTONIX) IV  40 mg Intravenous Q24H  . protein supplement  1 Scoop Oral TID WC    Continuous Infusions: . ampicillin (OMNIPEN) IV Stopped (07/06/19 7494)  . dextrose 150 mL/hr at 07/06/19 0824  . insulin 0.9 mL/hr at 07/06/19 0824     LOS: 13 days     Kayleen Memos, MD [facility] Pager [phone]  If 7PM-7AM, please contact night-coverage [email or web address] Password [facility] 07/06/2019, 9:19 AM

## 2019-07-06 NOTE — Progress Notes (Addendum)
RN spoke with NP regarding IV fluid orders. NA 153. See new orders.   NP also notified of earlier temp of 102, Tylenol given and ice packs applied, temp back down to 98. Will continue to monitor.   While turning patient during bath, pt Sp02 dropped to the 40s, pt turned back supine and O2 increased to 15L via [place]. Pt recovered to Sp02 100%. O2 decreased to 5L. Will continue to monitor.

## 2019-07-06 NOTE — Progress Notes (Signed)
[facility] NEPHROLOGY PROGRESS NOTE  Assessment/ Plan: Pt is a 59 y.o. yo [name] with metastatic prostate cancer, DM, HLD, CKD, CT scan with invasive prostate cancer into the bladder with severe bilateral hydronephrosis status post nephrostomy tube placement, seen for AKI.  #AKI on CKD due to obstructive uropathy: Bilateral ureteral obstruction with severe hydronephrosis from prostate ca metastasis, status post bilateral PCN done on 3/20.  Creatinine level was around 3-3.5 prior, now stable around 2.25 after PCN placement.  Monitor lab.  Continue IV fluid.  #Hypernatremia due to postobstructive diuresis: Replacing IV fluid with half NS to avoid volume depletion and also D5W.  Sodium level continue to improve.  Monitor lab.  #Sepsis/Enterococcus bacteremia: On ampicillin.  Echo with no vegetation.  ID following.  #Prostate cancer with metastasis, bladder obstruction, debility and chronic anemia: Follow with oncology.  #Acute metabolic encephalopathy//debility: Continue current management, avoid sedatives.  #Anemia of critical illness: PRBC as needed.  Monitor hemoglobin.  Subjective: Patient was seen and examined in ICU.  Remains confused and lethargic.  Review of system limited.  Urine output is recorded 8.1 liters in last 24 hours.  No other events. Objective Vital signs in last 24 hours: Vitals:   07/06/19 0859 07/06/19 0900 07/06/19 1000 07/06/19 1100  BP:      Pulse: (!) 116 (!) 119 (!) 121 (!) 116  Resp: (!) 34 (!) 32 16 (!) 27  Temp:    100.3 F (37.9 C)  TempSrc:    Axillary  SpO2: 100% 100% 100% 100%  Weight:      Height:       Weight change:   Intake/Output Summary (Last 24 hours) at 07/06/2019 1231 Last data filed at 07/06/2019 0900 Gross per 24 hour  Intake 13423.44 ml  Output 6880 ml  Net 6543.44 ml       Labs: Basic Metabolic Panel: Recent Labs  Lab 07/03/19 0142 07/03/19 1652 07/05/19 2118 07/06/19 0032 07/06/19 0721  NA 153*   < > 151*  153* 149*  K 3.9   < > 3.8 3.8 3.8  CL 117*   < > 117* 117* 115*  CO2 26   < > 24 26 24   GLUCOSE 182*   < > 167* 152* 164*  BUN 50*   < > 41* 43* 40*  CREATININE 3.20*   < > 2.33* 2.29* 2.25*  CALCIUM 9.7   < > 10.1 10.4* 10.4*  PHOS 4.2  --   --   --   --    < > = values in this interval not displayed.   Liver Function Tests: Recent Labs  Lab 07/01/19 0209 07/05/19 0701 07/06/19 0721  AST 35 35 46*  ALT 21 26 27   ALKPHOS 145* 149* 185*  BILITOT 0.3 0.3 0.6  PROT 5.5* 6.1* 5.7*  ALBUMIN 1.5* 1.5* 1.5*   No results for input(s): LIPASE, AMYLASE in the last 168 hours. No results for input(s): AMMONIA in the last 168 hours. CBC: Recent Labs  Lab 07/11/2019 0214 06/16/2019 0214 07/03/19 0142 07/03/19 0142 07/04/19 0509 07/04/19 0509 07/05/19 0436 07/05/19 0436 07/05/19 0739 07/05/19 1902 07/06/19 0032  WBC 7.8   < > 8.4   < > 8.2  --  7.6  --   --   --  8.3  NEUTROABS 5.5   < > 5.8   < > 5.5  --  5.0  --   --   --  5.4  HGB 7.9*   < > 7.8*   < >  7.6*   < > 6.8*   < > 7.2* 8.9* 8.6*  HCT 27.3*   < > 28.1*   < > 28.4*   < > 25.2*   < > 26.8* 31.8* 30.4*  MCV 92.5  --  94.9  --  95.9  --  98.4  --   --   --  94.7  PLT 196   < > 173   < > 153  --  153  --   --   --  139*   < > = values in this interval not displayed.   Cardiac Enzymes: No results for input(s): CKTOTAL, CKMB, CKMBINDEX, TROPONINI in the last 168 hours. CBG: Recent Labs  Lab 07/06/19 0654 07/06/19 0804 07/06/19 0916 07/06/19 1027 07/06/19 1144  GLUCAP 142* 131* 139* 141* 141*    Iron Studies: No results for input(s): IRON, TIBC, TRANSFERRIN, FERRITIN in the last 72 hours. Studies/Results: DG Abd 1 View  Result Date: 07/06/2019 CLINICAL DATA:  NG tube placement. EXAM: ABDOMEN - 1 VIEW COMPARISON:  CT abdomen and pelvis 06/29/2019 FINDINGS: An enteric tube terminates over the proximal to mid gastric body. Bilateral percutaneous nephrostomy tubes are present. No dilated loops of bowel are seen to  suggest obstruction although the right lower abdomen and pelvis were not imaged. Widespread heterogeneous sclerosis of the included osseous structures reflects known metastatic prostate cancer. IMPRESSION: Enteric tube in the gastric body. Electronically Signed   By: Jeffery [name] M.D.   On: 07/06/2019 10:29    Medications: Infusions: . ampicillin (OMNIPEN) IV Stopped (07/06/19 3254)  . dextrose 150 mL/hr at 07/06/19 0900  . insulin 0.9 mL/hr at 07/06/19 0900    Scheduled Medications: . Chlorhexidine Gluconate Cloth  6 each Topical Daily  . feeding supplement (ENSURE ENLIVE)  237 mL Oral TID BM  . feeding supplement (PRO-STAT SUGAR FREE 64)  30 mL Oral BID  . gabapentin  100 mg Oral BID  . mouth rinse  15 mL Mouth Rinse BID  . metoprolol tartrate  5 mg Intravenous Q6H  . multivitamin with minerals  1 tablet Oral Daily  . pantoprazole (PROTONIX) IV  40 mg Intravenous Q24H  . protein supplement  1 Scoop Oral TID WC    have reviewed scheduled and prn medications.  Physical Exam: General: Ill-looking lethargic [name] Heart:RRR, s1s2 nl, no rubs Lungs: Bibasal decreased breath sound, no wheezing Abdomen:soft, Non-tender, non-distended Extremities:No pitting edema GU: Bilateral nephrostomy tube  Jeffery [name] 07/06/2019,12:31 PM  LOS: 13 days  Pager: [phone]

## 2019-07-06 NOTE — Progress Notes (Signed)
OT Cancellation Note  Patient Details Name: Jeffery [name] MRN: [id number] DOB: 1960/08/13   Cancelled Treatment:    Reason Eval/Treat Not Completed: Medical issues which prohibited therapy. Will attempt OT tx session on different date and time.  Jaycie Kregel OTR/L   Jontay Maston 07/06/2019, 9:21 AM

## 2019-07-06 NOTE — Progress Notes (Signed)
Referring Physician(s): Hall,C/Bell,E  Supervising Physician: Laurence Ferrari Patient Status:  [facility] - In-pt  Chief Complaint:  Bilateral hydronephrosis, prostate cancer  Subjective: No new c/o   Allergies: Patient has no known allergies.  Medications:  Current Facility-Administered Medications:  .  [DISCONTINUED] acetaminophen (TYLENOL) tablet 650 mg, 650 mg, Oral, Q6H PRN, 650 mg at 06/29/19 1624 **OR** acetaminophen (TYLENOL) suppository 650 mg, 650 mg, Rectal, Q6H PRN, Buford Dresser, MD .  acetaminophen (TYLENOL) tablet 1,000 mg, 1,000 mg, Oral, Q6H PRN, Buford Dresser, MD, 1,000 mg at 07/05/19 2010 .  ampicillin (OMNIPEN) 2 g in sodium chloride 0.9 % 100 mL IVPB, 2 g, Intravenous, Q6H, Emiliano Dyer, RPH, Stopped at 07/06/19 0102 .  Chlorhexidine Gluconate Cloth 2 % PADS 6 each, 6 each, Topical, Daily, Buford Dresser, MD, 6 each at 07/06/19 [phone] .  dextrose 5 % solution, , Intravenous, Continuous, Hall, Carole N, DO, Last Rate: 150 mL/hr at 07/06/19 0900, Rate Verify at 07/06/19 0900 .  dextrose 50 % solution 0-50 mL, 0-50 mL, Intravenous, PRN, Hall, Carole N, DO .  feeding supplement (ENSURE ENLIVE) (ENSURE ENLIVE) liquid 237 mL, 237 mL, Oral, TID BM, Buford Dresser, MD, 237 mL at 07/05/19 0940 .  feeding supplement (PRO-STAT SUGAR FREE 64) liquid 30 mL, 30 mL, Oral, BID, Buford Dresser, MD, 30 mL at 07/05/19 1202 .  fentaNYL (SUBLIMAZE) injection 25 mcg, 25 mcg, Intravenous, Q2H PRN, Buford Dresser, MD, 25 mcg at 07/05/19 0328 .  gabapentin (NEURONTIN) capsule 100 mg, 100 mg, Oral, BID, Buford Dresser, MD, 100 mg at 07/05/19 2151 .  insulin regular, human (MYXREDLIN) 100 units/ 100 mL infusion, , Intravenous, Continuous, Hall, Carole N, DO, Last Rate: 0.9 mL/hr at 07/06/19 0900, Rate Verify at 07/06/19 0900 .  MEDLINE mouth rinse, 15 mL, Mouth Rinse, BID, Buford Dresser, MD, 15 mL at 07/06/19 [phone] .   metoprolol tartrate (LOPRESSOR) injection 2.5 mg, 2.5 mg, Intravenous, Q4H PRN, Buford Dresser, MD, 2.5 mg at 07/03/19 1753 .  metoprolol tartrate (LOPRESSOR) injection 5 mg, 5 mg, Intravenous, Q6H, Hall, Carole N, DO .  multivitamin with minerals tablet 1 tablet, 1 tablet, Oral, Daily, Buford Dresser, MD, 1 tablet at 07/05/19 0944 .  ondansetron (ZOFRAN) tablet 4 mg, 4 mg, Oral, Q6H PRN **OR** ondansetron (ZOFRAN) injection 4 mg, 4 mg, Intravenous, Q6H PRN, Buford Dresser, MD .  oxyCODONE (Oxy IR/ROXICODONE) immediate release tablet 5 mg, 5 mg, Oral, Q6H PRN, Buford Dresser, MD, 5 mg at 07/03/19 0347 .  pantoprazole (PROTONIX) injection 40 mg, 40 mg, Intravenous, Q24H, Hall, Carole N, DO, 40 mg at 07/06/19 4259 .  protein supplement (RESOURCE BENEPROTEIN) powder packet 6 g, 1 Scoop, Oral, TID WC, Buford Dresser, MD, 6 g at 07/05/19 1741 .  sodium chloride (OCEAN) 0.65 % nasal spray 1 spray, 1 spray, Each Nare, PRN, Buford Dresser, MD    Vital Signs: BP 134/65   Pulse (!) 121   Temp 99.8 F (37.7 C) (Oral)   Resp 16   Ht 5\' 9"  (1.753 m)   Wt 97.8 kg   SpO2 100%   BMI 31.84 kg/m   Physical Exam awake, answer few simple questions with pauses; bilat PCN's intact, sites NT;   (R)PCN output remains bloody, flushes easily, return of blood tinged fluid (L)PCN output clear yellow  Imaging: ECHO TEE  Result Date: 06/14/2019    TRANSESOPHOGEAL ECHO REPORT   Patient Name:   Jeffery [name] Date of Exam: 07/09/2019 Medical Rec #:  [id number]  Height:       69.0 in Accession #:    [id number]          Weight:       226.0 lb Date of Birth:  [birth year]           BSA:          2.176 m Patient Age:    59 years            BP:           132/75 mmHg Patient Gender: M                   HR:           114 bpm. Exam Location:  Inpatient Procedure: Transesophageal Echo Indications:     Bacteremia  History:         Patient has prior history of  Echocardiogram examinations, most                  recent 06/18/2019. Risk Factors:Diabetes, Hypertension and                  Former Smoker. CKD.  Sonographer:     Clayton Lefort RDCS (AE) Referring Phys:  [id number] Abigail Butts Diagnosing Phys: Buford Dresser MD PROCEDURE: After discussion of the risks and benefits of a TEE, an informed consent was obtained from the patient. The transesophogeal probe was passed without difficulty through the esophogus of the patient. Sedation performed by different physician. The patient was monitored while under deep sedation. Anesthestetic sedation was provided intravenously by Anesthesiology: 200.4mg  of Propofol. Image quality was adequate. The patient developed no complications during the procedure. IMPRESSIONS  1. Left ventricular ejection fraction, by estimation, is 50 to 55%. The left ventricle has low normal function. The left ventricle has no regional wall motion abnormalities. Left ventricular diastolic function could not be evaluated.  2. Right ventricular systolic function is normal. The right ventricular size is normal. There is normal pulmonary artery systolic pressure.  3. No left atrial/left atrial appendage thrombus was detected.  4. The mitral valve is normal in structure. Trivial mitral valve regurgitation. No evidence of mitral stenosis.  5. The aortic valve is tricuspid. Aortic valve regurgitation is not visualized. No aortic stenosis is present.  6. There is mild (Grade II) plaque involving the descending aorta. Conclusion(s)/Recommendation(s): No evidence of vegetation/infective endocarditis on this transesophageal echocardiogram. FINDINGS  Left Ventricle: Left ventricular ejection fraction, by estimation, is 50 to 55%. The left ventricle has low normal function. The left ventricle has no regional wall motion abnormalities. The left ventricular internal cavity size was normal in size. There is no left ventricular hypertrophy. Left ventricular diastolic  function could not be evaluated. Right Ventricle: The right ventricular size is normal. No increase in right ventricular wall thickness. Right ventricular systolic function is normal. There is normal pulmonary artery systolic pressure. The tricuspid regurgitant velocity is 1.86 m/s, and  with an assumed right atrial pressure of 8 mmHg, the estimated right ventricular systolic pressure is 86.7 mmHg. Left Atrium: Left atrial size was not assessed. No left atrial/left atrial appendage thrombus was detected. Right Atrium: Right atrial size was not assessed. Pericardium: Trivial pericardial effusion is present. Mitral Valve: The mitral valve is normal in structure. Trivial mitral valve regurgitation. No evidence of mitral valve stenosis. There is no evidence of mitral valve vegetation. Tricuspid Valve: The tricuspid valve is normal in structure. Tricuspid valve regurgitation is trivial. No evidence of tricuspid  stenosis. There is no evidence of tricuspid valve vegetation. Aortic Valve: The aortic valve is tricuspid. Aortic valve regurgitation is not visualized. No aortic stenosis is present. There is no evidence of aortic valve vegetation. Pulmonic Valve: The pulmonic valve was normal in structure. Pulmonic valve regurgitation is not visualized. No evidence of pulmonic stenosis. There is no evidence of pulmonic valve vegetation. Aorta: The aortic root is normal in size and structure. There is mild (Grade II) plaque involving the descending aorta. IAS/Shunts: No atrial level shunt detected by color flow Doppler. There is no evidence of a patent foramen ovale.  TRICUSPID VALVE TR Peak grad:   13.8 mmHg TR Vmax:        186.00 cm/s Buford Dresser MD Electronically signed by Buford Dresser MD Signature Date/Time: 07/14/2019/2:08:54 PM    Final    IR NEPHROSTOMY PLACEMENT LEFT  Result Date: 07/03/2019 INDICATION: 59 year old male with metastatic prostate cancer, bilateral obstructed hydronephrosis and acute  kidney injury. He requires placement of bilateral percutaneous nephrostomy tubes. A thoracentesis was also requested based off of CT imaging from 06/29/2019. However, when the patient was evaluated with real-time ultrasound, there is only trace pleural fluid which was insufficient for drainage. Therefore, thoracentesis was deferred. EXAM: IR NEPHROSTOMY PLACEMENT LEFT; IR NEPHROSTOMY PLACEMENT RIGHT COMPARISON:  None. MEDICATIONS: Patient currently admitted as an inpatient receiving intravenous antibiotics. No additional antibiotic prophylaxis was administered. ANESTHESIA/SEDATION: Fentanyl 100 mcg IV; Versed 2 mg IV Moderate Sedation Time:  37 minutes The patient was continuously monitored during the procedure by the interventional radiology nurse under my direct supervision. CONTRAST:  20 mL Isovue-300-administered into the collecting system(s) FLUOROSCOPY TIME:  Fluoroscopy Time: 2 minutes 12 seconds (22 mGy). COMPLICATIONS: None immediate. TECHNIQUE: The procedure, risks, benefits, and alternatives were explained to the patient. Questions regarding the procedure were encouraged and answered. The patient understands and consents to the procedure. The left flank was prepped with chlorhexidine in a sterile fashion, and a sterile drape was applied covering the operative field. A sterile gown and sterile gloves were used for the procedure. Local anesthesia was provided with 1% Lidocaine. The left flank was interrogated with ultrasound and the left kidney identified. The kidney is hydronephrotic. A suitable access site on the skin overlying the lower pole, posterior calix was identified. After local mg anesthesia was achieved, a small skin nick was made with an 11 blade scalpel. A 21 gauge Accustick needle was then advanced under direct sonographic guidance into the lower pole of the left kidney. A 0.018 inch wire was advanced under fluoroscopic guidance into the left renal collecting system. The Accustick sheath was  then advanced over the wire and a 0.018 system exchanged for a 0.035 system. Gentle hand injection of contrast material confirms placement of the sheath within the renal collecting system. There is marked hydronephrosis. The tract from the scan into the renal collecting system was then dilated serially to 10-French. A 10-French Cook all-purpose drain was then placed and positioned under fluoroscopic guidance. The locking loop is well formed within the left renal pelvis. The catheter was secured to the skin with 2-0 Prolene and a sterile bandage was placed. Catheter was left to gravity bag drainage. Using the exact same technique, a 10.2 French percutaneous nephrostomy catheter was placed in the right kidney via a posterior lower pole calyx. Contrast injection into the renal collecting system demonstrates marked hydronephrosis. Both catheters were secured to the skin with 0 Prolene suture and connected to gravity bag drainage. The patient tolerated the procedure  well. IMPRESSION: Successful placement of a bilateral 10 French percutaneous nephrostomy tubes. PLAN: 1. Maintain tubes to gravity bag drainage.  Follow output. 2. Return Interventional Radiology in 8 weeks for bilateral nephrostomy tube exchange. Electronically Signed   By: Jacqulynn Cadet M.D.   On: 07/03/2019 10:19   IR NEPHROSTOMY PLACEMENT RIGHT  Result Date: 07/03/2019 INDICATION: 59 year old male with metastatic prostate cancer, bilateral obstructed hydronephrosis and acute kidney injury. He requires placement of bilateral percutaneous nephrostomy tubes. A thoracentesis was also requested based off of CT imaging from 06/29/2019. However, when the patient was evaluated with real-time ultrasound, there is only trace pleural fluid which was insufficient for drainage. Therefore, thoracentesis was deferred. EXAM: IR NEPHROSTOMY PLACEMENT LEFT; IR NEPHROSTOMY PLACEMENT RIGHT COMPARISON:  None. MEDICATIONS: Patient currently admitted as an inpatient  receiving intravenous antibiotics. No additional antibiotic prophylaxis was administered. ANESTHESIA/SEDATION: Fentanyl 100 mcg IV; Versed 2 mg IV Moderate Sedation Time:  37 minutes The patient was continuously monitored during the procedure by the interventional radiology nurse under my direct supervision. CONTRAST:  20 mL Isovue-300-administered into the collecting system(s) FLUOROSCOPY TIME:  Fluoroscopy Time: 2 minutes 12 seconds (22 mGy). COMPLICATIONS: None immediate. TECHNIQUE: The procedure, risks, benefits, and alternatives were explained to the patient. Questions regarding the procedure were encouraged and answered. The patient understands and consents to the procedure. The left flank was prepped with chlorhexidine in a sterile fashion, and a sterile drape was applied covering the operative field. A sterile gown and sterile gloves were used for the procedure. Local anesthesia was provided with 1% Lidocaine. The left flank was interrogated with ultrasound and the left kidney identified. The kidney is hydronephrotic. A suitable access site on the skin overlying the lower pole, posterior calix was identified. After local mg anesthesia was achieved, a small skin nick was made with an 11 blade scalpel. A 21 gauge Accustick needle was then advanced under direct sonographic guidance into the lower pole of the left kidney. A 0.018 inch wire was advanced under fluoroscopic guidance into the left renal collecting system. The Accustick sheath was then advanced over the wire and a 0.018 system exchanged for a 0.035 system. Gentle hand injection of contrast material confirms placement of the sheath within the renal collecting system. There is marked hydronephrosis. The tract from the scan into the renal collecting system was then dilated serially to 10-French. A 10-French Cook all-purpose drain was then placed and positioned under fluoroscopic guidance. The locking loop is well formed within the left renal pelvis. The  catheter was secured to the skin with 2-0 Prolene and a sterile bandage was placed. Catheter was left to gravity bag drainage. Using the exact same technique, a 10.2 French percutaneous nephrostomy catheter was placed in the right kidney via a posterior lower pole calyx. Contrast injection into the renal collecting system demonstrates marked hydronephrosis. Both catheters were secured to the skin with 0 Prolene suture and connected to gravity bag drainage. The patient tolerated the procedure well. IMPRESSION: Successful placement of a bilateral 10 French percutaneous nephrostomy tubes. PLAN: 1. Maintain tubes to gravity bag drainage.  Follow output. 2. Return Interventional Radiology in 8 weeks for bilateral nephrostomy tube exchange. Electronically Signed   By: Jacqulynn Cadet M.D.   On: 07/03/2019 10:19    Labs:  CBC: Recent Labs    07/03/19 0142 07/03/19 0142 07/04/19 0509 07/04/19 0509 07/05/19 0436 07/05/19 0739 07/05/19 1902 07/06/19 0032  WBC 8.4  --  8.2  --  7.6  --   --  8.3  HGB 7.8*   < > 7.6*   < > 6.8* 7.2* 8.9* 8.6*  HCT 28.1*   < > 28.4*   < > 25.2* 26.8* 31.8* 30.4*  PLT 173  --  153  --  153  --   --  139*   < > = values in this interval not displayed.    COAGS: Recent Labs    07/03/19 0142  INR 1.3*    BMP: Recent Labs    07/05/19 1902 07/05/19 2118 07/06/19 0032 07/06/19 0721  NA 155* 151* 153* 149*  K 4.0 3.8 3.8 3.8  CL 118* 117* 117* 115*  CO2 25 24 26 24   GLUCOSE 165* 167* 152* 164*  BUN 44* 41* 43* 40*  CALCIUM 10.6* 10.1 10.4* 10.4*  CREATININE 2.26* 2.33* 2.29* 2.25*  GFRNONAA 31* 30* 31* 31*  GFRAA 36* 35* 36* 36*    LIVER FUNCTION TESTS: Recent Labs    06/27/19 0317 07/01/19 0209 07/05/19 0701 07/06/19 0721  BILITOT 0.8 0.3 0.3 0.6  AST 40 35 35 46*  ALT 26 21 26 27   ALKPHOS 158* 145* 149* 185*  PROT 5.9* 5.5* 6.1* 5.7*  ALBUMIN 1.7* 1.5* 1.5* 1.5*    Assessment and Plan: Patient with history of metastatic prostate  cancer, bilateral obstructive hydronephrosis, acute kidney injury.  Status post placement of bilateral nephrostomies on 07/03/2019. If urine remains bloody consider f/u nephrostogram. Will need routine bilateral nephrostomy tube exchanges in 8 weeks.   Electronically Signed: Ascencion Dike, PA-C 07/06/2019, 10:09 AM   I spent a total of 15 minutes at the the patient's bedside AND on the patient's hospital floor or unit, greater than 50% of which was counseling/coordinating care for bilateral percutaneous nephrostomies

## 2019-07-06 NOTE — Progress Notes (Signed)
BSE completed, full report to follow.  Rec NPO x single ice chips, tsps water only when fully alert.  Kathleen Lime, MS [place] Office [phone]

## 2019-07-06 NOTE — Evaluation (Signed)
Clinical/Bedside Swallow Evaluation Patient Details  Name: Jeffery [name] MRN: [id number] Date of Birth: 04-19-1960  Today's Date: 07/06/2019 Time: SLP Start Time (ACUTE ONLY): 0806 SLP Stop Time (ACUTE ONLY): 0816 SLP Time Calculation (min) (ACUTE ONLY): 10 min  Past Medical History:  Past Medical History:  Diagnosis Date  . Cancer ([place])    stage IV prostate cancer per patient  . Diabetes mellitus, new onset ([place]) 07/2016  . Enlarged prostate   . HLD (hyperlipidemia)    Past Surgical History:  Past Surgical History:  Procedure Laterality Date  . I & D EXTREMITY Left 01/27/2019   Procedure: IRRIGATION AND DEBRIDEMENT EXTREMITY;  Surgeon: Iran Planas, MD;  Location: [facility];  Service: Orthopedics;  Laterality: Left;  . IR NEPHROSTOMY PLACEMENT LEFT  07/03/2019  . IR NEPHROSTOMY PLACEMENT RIGHT  07/03/2019  . KNEE ARTHROSCOPY Right   . ORCHIECTOMY Bilateral 10/12/2018   Procedure: ORCHIECTOMY;  Surgeon: Lucas Mallow, MD;  Location: [facility];  Service: Urology;  Laterality: Bilateral;  . TEE WITHOUT CARDIOVERSION N/A 06/27/2019   Procedure: TRANSESOPHAGEAL ECHOCARDIOGRAM (TEE);  Surgeon: Buford Dresser, MD;  Location: [facility];  Service: Cardiovascular;  Laterality: N/A;  . TRANSURETHRAL RESECTION OF PROSTATE  10/12/2018   Procedure: CYSTOSCOPY WITH URETHRAL DILATION;  Surgeon: Lucas Mallow, MD;  Location: [facility];  Service: Urology;;   HPI:  pt is a 59 yo male with locally metastatic prostate cancer s/p XRT to C6-T1 completed 05/06/2019, urinary obstruction, severe hydronephrosis, AKI, sepsis with Enterococcus bacteremia, anemia requiring blood transfusion.  Bilateral nephrostomy tubes placed by IR 3/20. Pt with hypovolemia due to post-obstructive diuresis.  Swallow evaluation ordered.  RN in room when SlP arrived and reports pt's mentation has similar to current condition.  Per oncology note in February 2021, right pleural effusion noted and spouse had reported  several episodes of [place] having right sided numbness/weakness and nearly falling with some confusion post events.Pt has been febrile intermittently throughout entire hospital coarse.  Last CXR showed osseous mets, right lung opacity concerning for mild infection and right pleural effusion.  Day RN reports Theatre manager stated pt was "gurgly" after intake of po medications with puree.   Assessment / Plan / Recommendation Clinical Impression  Pt sleepy but did awaken to maximum stimulation verbal and tactile.  When awoken, he allowed viscous secretions to spill from his left anterior oral cavity.  With verbal cues, he opened mouth minimally which limited ability to perform oral motor exam but SlP was able to perform oral care using oral suction to remove minimal secretion retained in posterior right hard palate.  Pt held his mouth closed frequently during session despite cues to open - ? cognitive related.  Speech was severely dysarthric and he did not follow directions during session.     Provided pt with limited po of tsp water and small bolus of applesauce.  Oral holding noted across minimal intake with delayed swallow. SlP orally suctioned and removed minimal amount of applesauce retained in oral cavity.  No overt indication of aspiration but due to pt's mentation, decreased awareness and poorly sustained attention, he is at high malnutrition and aspiration risk.   Rec NPO x single ice chips, tsps water only when fully alert.     SLP will follow up for diagnositic treatment.  RN reports discussion of tube feeding has already taken place.  Left swallow precaution sign in pt's room.  Thanks for this consult. SLP Visit Diagnosis: Dysphagia, oropharyngeal phase (R13.12);Dysphagia, unspecified (R13.10)  Aspiration Risk  Severe aspiration risk;Risk for inadequate nutrition/hydration    Diet Recommendation NPO;Ice chips PRN after oral care(tsps thin water, frequent oral care)   Liquid Administration via:  Spoon Medication Administration: Via alternative means Compensations: Slow rate    Other  Recommendations Oral Care Recommendations: Oral care QID   Follow up Recommendations Skilled Nursing facility      Frequency and Duration min 2x/week  2 weeks       Prognosis Prognosis for Safe Diet Advancement: Guarded Barriers to Reach Goals: Other (Comment)(medical diagnosis)      Swallow Study   General HPI: pt is a 59 yo male with locally metastatic prostate cancer s/p XRT to C6-T1 completed 05/06/2019, urinary obstruction, severe hydronephrosis, AKI, sepsis with Enterococcus bacteremia, anemia requiring blood transfusion.  Bilateral nephrostomy tubes placed by IR 3/20. Pt with hypovolemia due to post-obstructive diuresis.  Swallow evaluation ordered.  RN in room when SlP arrived and reports pt's mentation has similar to current condition.  Per oncology note in February 2021, right pleural effusion noted and spouse had reported several episodes of Lon having right sided numbness/weakness and nearly falling with some confusion post events.Pt has been febrile intermittently throughout entire hospital coarse.  Last CXR showed osseous mets, right lung opacity concerning for mild infection and right pleural effusion.  Day RN reports Theatre manager stated pt was "gurgly" after intake of po medications with puree. Type of Study: Bedside Swallow Evaluation Previous Swallow Assessment: none Diet Prior to this Study: Regular;Thin liquids Temperature Spikes Noted: Yes Respiratory Status: Nasal cannula(5) History of Recent Intubation: No Behavior/Cognition: Lethargic/Drowsy;Distractible Oral Cavity Assessment: Other (comment)(minimal ability to view as pt did not open oral cavity widely) Oral Care Completed by SLP: No(pt will not open mouth widely to allow oral care or intervention) Vision: Impaired for self-feeding Self-Feeding Abilities: Total assist Patient Positioning: Upright in bed Baseline Vocal  Quality: Low vocal intensity Volitional Cough: Cognitively unable to elicit Volitional Swallow: Unable to elicit    Oral/Motor/Sensory Function Overall Oral Motor/Sensory Function: Generalized oral weakness(pt did not follow directions for exam, no focal CN deficits obvious)   Ice Chips Ice chips: Not tested   Thin Liquid Thin Liquid: Impaired Presentation: Spoon Oral Phase Impairments: Reduced labial seal Oral Phase Functional Implications: Oral holding Pharyngeal  Phase Impairments: Suspected delayed Swallow    Nectar Thick Nectar Thick Liquid: Not tested   Honey Thick Honey Thick Liquid: Not tested   Puree Puree: Impaired Presentation: Spoon Oral Phase Impairments: Reduced labial seal Oral Phase Functional Implications: Prolonged oral transit;Oral residue Other Comments: SlP orally suctioned residuals from mouth that pt did not sense   Solid     Solid: Not tested      Macario Golds 07/06/2019,11:33 AM   Kathleen Lime, MS [place] Office [phone]

## 2019-07-07 ENCOUNTER — Inpatient Hospital Stay (HOSPITAL_COMMUNITY): Payer: Medicaid Other

## 2019-07-07 DIAGNOSIS — G9341 Metabolic encephalopathy: Secondary | ICD-10-CM

## 2019-07-07 LAB — COMPREHENSIVE METABOLIC PANEL
ALT: 23 U/L (ref 0–44)
ALT: 23 U/L (ref 0–44)
AST: 36 U/L (ref 15–41)
AST: 36 U/L (ref 15–41)
Albumin: 1.5 g/dL — ABNORMAL LOW (ref 3.5–5.0)
Albumin: 1.4 g/dL — ABNORMAL LOW (ref 3.5–5.0)
Alkaline Phosphatase: 233 U/L — ABNORMAL HIGH (ref 38–126)
Alkaline Phosphatase: 226 U/L — ABNORMAL HIGH (ref 38–126)
Anion gap: 9 (ref 5–15)
Anion gap: 6 (ref 5–15)
BUN: 45 mg/dL — ABNORMAL HIGH (ref 6–20)
BUN: 42 mg/dL — ABNORMAL HIGH (ref 6–20)
CO2: 23 mmol/L (ref 22–32)
CO2: 27 mmol/L (ref 22–32)
Calcium: 10.4 mg/dL — ABNORMAL HIGH (ref 8.9–10.3)
Calcium: 10.4 mg/dL — ABNORMAL HIGH (ref 8.9–10.3)
Chloride: 117 mmol/L — ABNORMAL HIGH (ref 98–111)
Chloride: 116 mmol/L — ABNORMAL HIGH (ref 98–111)
Creatinine, Ser: 2.41 mg/dL — ABNORMAL HIGH (ref 0.61–1.24)
Creatinine, Ser: 2.36 mg/dL — ABNORMAL HIGH (ref 0.61–1.24)
GFR calc Af Amer: 34 mL/min — ABNORMAL LOW (ref >60)
GFR calc Af Amer: 34 mL/min — ABNORMAL LOW (ref >60)
GFR calc non Af Amer: 29 mL/min — ABNORMAL LOW (ref >60)
GFR calc non Af Amer: 30 mL/min — ABNORMAL LOW (ref >60)
Glucose, Bld: 191 mg/dL — ABNORMAL HIGH (ref 70–99)
Glucose, Bld: 189 mg/dL — ABNORMAL HIGH (ref 70–99)
Potassium: 3.9 mmol/L (ref 3.5–5.1)
Potassium: 4 mmol/L (ref 3.5–5.1)
Sodium: 149 mmol/L — ABNORMAL HIGH (ref 135–145)
Sodium: 149 mmol/L — ABNORMAL HIGH (ref 135–145)
Total Bilirubin: 0.6 mg/dL (ref 0.3–1.2)
Total Bilirubin: 0.4 mg/dL (ref 0.3–1.2)
Total Protein: 5.6 g/dL — ABNORMAL LOW (ref 6.5–8.1)
Total Protein: 5.5 g/dL — ABNORMAL LOW (ref 6.5–8.1)

## 2019-07-07 LAB — URINALYSIS, ROUTINE W REFLEX MICROSCOPIC
Bacteria, UA: NONE SEEN
Bilirubin Urine: NEGATIVE
Glucose, UA: 50 mg/dL — AB
Ketones, ur: NEGATIVE mg/dL
Nitrite: NEGATIVE
Protein, ur: NEGATIVE mg/dL
Specific Gravity, Urine: 1.004 — ABNORMAL LOW (ref 1.005–1.030)
pH: 7 (ref 5.0–8.0)

## 2019-07-07 LAB — GLUCOSE, CAPILLARY
Glucose-Capillary: 141 mg/dL — ABNORMAL HIGH (ref 70–99)
Glucose-Capillary: 144 mg/dL — ABNORMAL HIGH (ref 70–99)
Glucose-Capillary: 144 mg/dL — ABNORMAL HIGH (ref 70–99)
Glucose-Capillary: 153 mg/dL — ABNORMAL HIGH (ref 70–99)
Glucose-Capillary: 160 mg/dL — ABNORMAL HIGH (ref 70–99)
Glucose-Capillary: 186 mg/dL — ABNORMAL HIGH (ref 70–99)
Glucose-Capillary: 168 mg/dL — ABNORMAL HIGH (ref 70–99)
Glucose-Capillary: 159 mg/dL — ABNORMAL HIGH (ref 70–99)
Glucose-Capillary: 171 mg/dL — ABNORMAL HIGH (ref 70–99)
Glucose-Capillary: 164 mg/dL — ABNORMAL HIGH (ref 70–99)
Glucose-Capillary: 173 mg/dL — ABNORMAL HIGH (ref 70–99)
Glucose-Capillary: 183 mg/dL — ABNORMAL HIGH (ref 70–99)
Glucose-Capillary: 165 mg/dL — ABNORMAL HIGH (ref 70–99)
Glucose-Capillary: 153 mg/dL — ABNORMAL HIGH (ref 70–99)
Glucose-Capillary: 165 mg/dL — ABNORMAL HIGH (ref 70–99)
Glucose-Capillary: 180 mg/dL — ABNORMAL HIGH (ref 70–99)

## 2019-07-07 LAB — CBC WITH DIFFERENTIAL/PLATELET
Abs Immature Granulocytes: 0.4 10*3/uL — ABNORMAL HIGH (ref 0.00–0.07)
Abs Immature Granulocytes: 0.37 10*3/uL — ABNORMAL HIGH (ref 0.00–0.07)
Basophils Absolute: 0 10*3/uL (ref 0.0–0.1)
Basophils Absolute: 0 10*3/uL (ref 0.0–0.1)
Basophils Relative: 0 %
Basophils Relative: 0 %
Eosinophils Absolute: 0.1 10*3/uL (ref 0.0–0.5)
Eosinophils Absolute: 0.1 10*3/uL (ref 0.0–0.5)
Eosinophils Relative: 1 %
Eosinophils Relative: 1 %
HCT: 26.6 % — ABNORMAL LOW (ref 39.0–52.0)
HCT: 26.2 % — ABNORMAL LOW (ref 39.0–52.0)
Hemoglobin: 7.4 g/dL — ABNORMAL LOW (ref 13.0–17.0)
Hemoglobin: 7.4 g/dL — ABNORMAL LOW (ref 13.0–17.0)
Immature Granulocytes: 5 %
Immature Granulocytes: 4 %
Lymphocytes Relative: 15 %
Lymphocytes Relative: 12 %
Lymphs Abs: 1.3 10*3/uL (ref 0.7–4.0)
Lymphs Abs: 1 10*3/uL (ref 0.7–4.0)
MCH: 26.8 pg (ref 26.0–34.0)
MCH: 27.1 pg (ref 26.0–34.0)
MCHC: 27.8 g/dL — ABNORMAL LOW (ref 30.0–36.0)
MCHC: 28.2 g/dL — ABNORMAL LOW (ref 30.0–36.0)
MCV: 96.4 fL (ref 80.0–100.0)
MCV: 96 fL (ref 80.0–100.0)
Monocytes Absolute: 0.5 10*3/uL (ref 0.1–1.0)
Monocytes Absolute: 0.3 10*3/uL (ref 0.1–1.0)
Monocytes Relative: 6 %
Monocytes Relative: 4 %
Neutro Abs: 6.6 10*3/uL (ref 1.7–7.7)
Neutro Abs: 6.6 10*3/uL (ref 1.7–7.7)
Neutrophils Relative %: 73 %
Neutrophils Relative %: 79 %
Platelets: 140 10*3/uL — ABNORMAL LOW (ref 150–400)
Platelets: 155 10*3/uL (ref 150–400)
RBC: 2.76 MIL/uL — ABNORMAL LOW (ref 4.22–5.81)
RBC: 2.73 MIL/uL — ABNORMAL LOW (ref 4.22–5.81)
RDW: 18.8 % — ABNORMAL HIGH (ref 11.5–15.5)
RDW: 18.7 % — ABNORMAL HIGH (ref 11.5–15.5)
WBC: 8.9 10*3/uL (ref 4.0–10.5)
WBC: 8.3 10*3/uL (ref 4.0–10.5)
nRBC: 0.2 % (ref 0.0–0.2)
nRBC: 0 % (ref 0.0–0.2)

## 2019-07-07 LAB — PROCALCITONIN: Procalcitonin: 13.42 ng/mL

## 2019-07-07 LAB — LACTIC ACID, PLASMA: Lactic Acid, Venous: 1.5 mmol/L (ref 0.5–1.9)

## 2019-07-07 LAB — AMMONIA: Ammonia: 28 umol/L (ref 9–35)

## 2019-07-07 LAB — PHOSPHORUS: Phosphorus: 4.7 mg/dL — ABNORMAL HIGH (ref 2.5–4.6)

## 2019-07-07 LAB — MAGNESIUM: Magnesium: 1.7 mg/dL (ref 1.7–2.4)

## 2019-07-07 IMAGING — DX DG CHEST 1V PORT
1 series · 1 of 1 positions shown · non-contrast
Comparison: Chest CT [DATE], chest radiograph [DATE]

CLINICAL DATA: Dyspnea.

EXAM:
PORTABLE CHEST 1 VIEW

[chest ap]
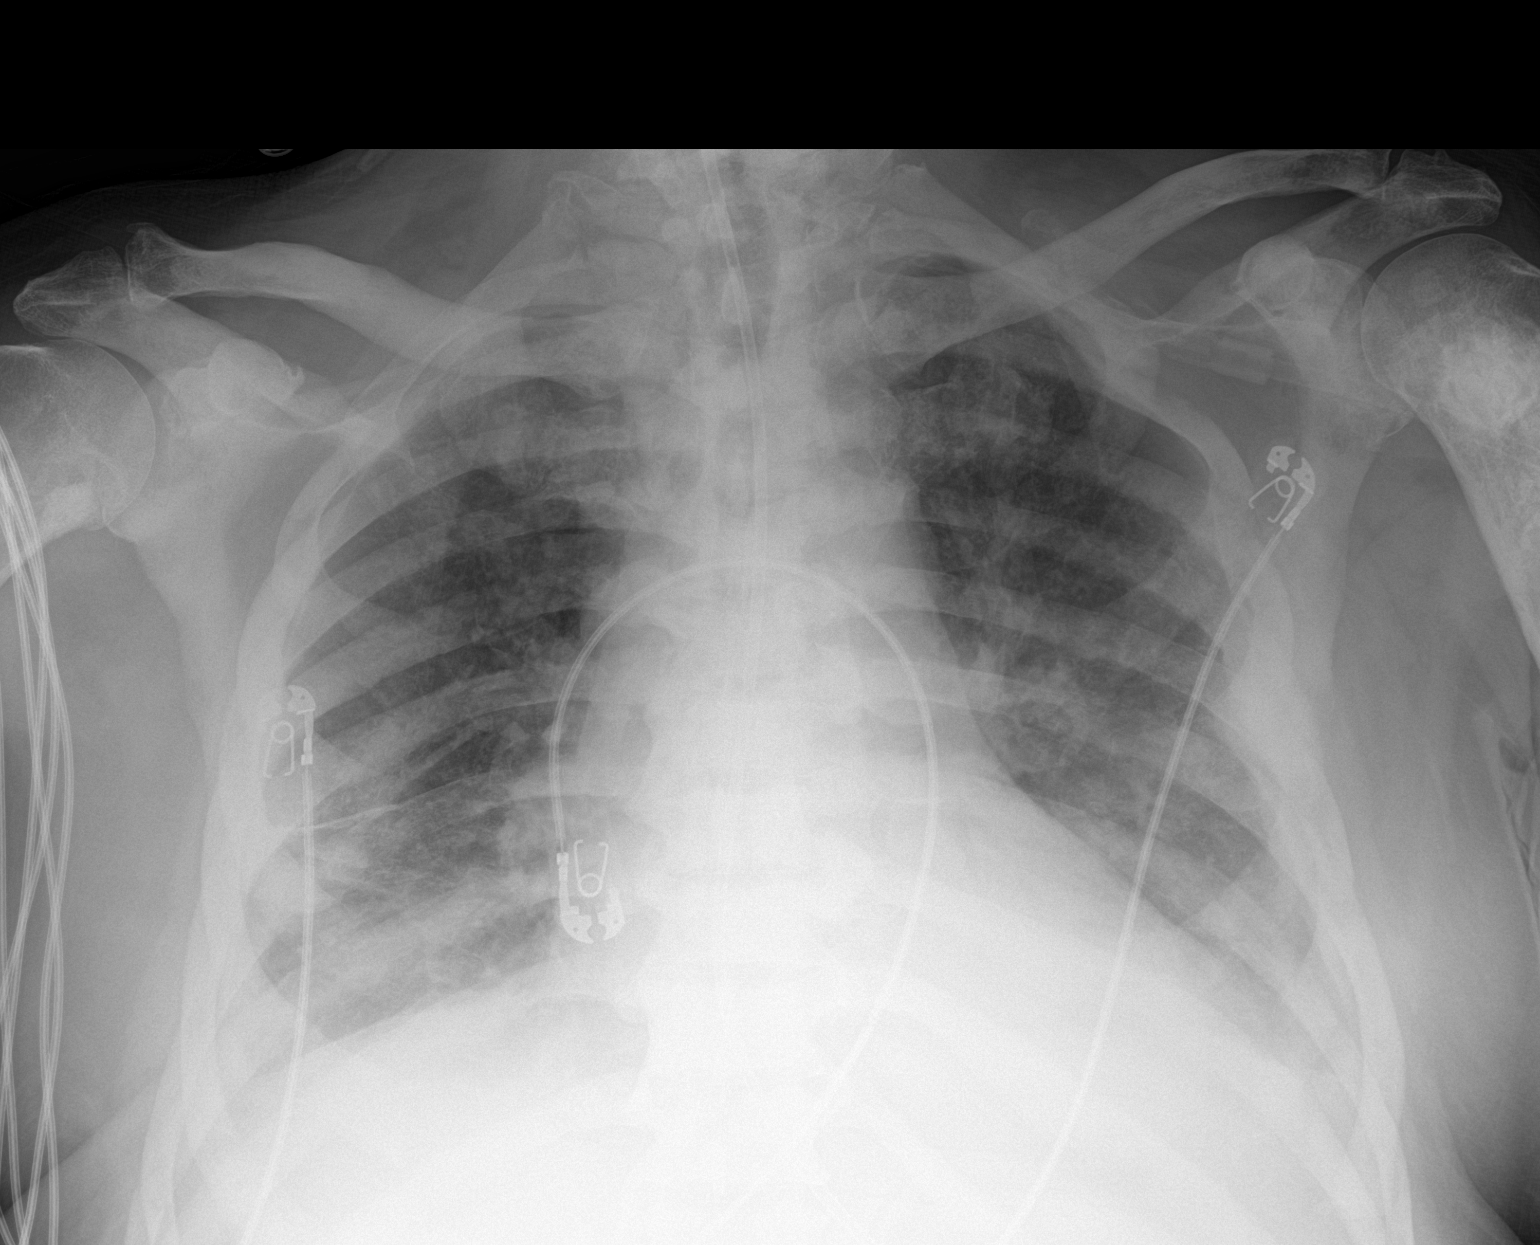

[1 of 1 positions shown; findings below may reference images not displayed]

FINDINGS: An enteric tube passes below the level of the left hemidiaphragm
with tip excluded from the field of view.

Shallow inspiration radiograph with accentuation of the cardiac
silhouette. Heart size likely within normal limits. Similar
appearance of small to moderate bilateral pleural effusions with
bibasilar atelectasis. Additional ill-defined opacity within the
lateral aspect of the mid to lower right lung field likely reflects
the atelectasis demonstrated on prior chest CT. Redemonstrated
diffuse osseous metastases.
IMPRESSION: An enteric tube passes below the level of the left hemidiaphragm
with tip excluded from the field of view.

Otherwise, no appreciable interval change as compared to [DATE].

Persistent small to moderate bilateral pleural effusions with
bibasilar atelectasis.

Additional ill-defined opacity within the lateral aspect of the mid
to lower right lung field likely corresponds with the atelectasis
demonstrated on recent prior chest CT.

## 2019-07-07 MED ORDER — INSULIN ASPART 100 UNIT/ML ~~LOC~~ SOLN
0.0000 [IU] | Freq: Three times a day (TID) | SUBCUTANEOUS | Status: DC
  Administered 2019-07-07 (×2): 2 [IU] via SUBCUTANEOUS

## 2019-07-07 MED ORDER — PRO-STAT SUGAR FREE PO LIQD
30.0000 mL | Freq: Two times a day (BID) | ORAL | Status: DC
  Administered 2019-07-07: 12:00:00 30 mL
  Filled 2019-07-07: qty 30

## 2019-07-07 MED ORDER — SODIUM CHLORIDE 0.45 % IV SOLN: INTRAVENOUS | Status: DC

## 2019-07-07 MED ORDER — INSULIN GLARGINE 100 UNIT/ML ~~LOC~~ SOLN
10.0000 [IU] | SUBCUTANEOUS | Status: DC
  Administered 2019-07-07: 10 [IU] via SUBCUTANEOUS
  Filled 2019-07-07 (×2): qty 0.1

## 2019-07-07 MED ORDER — INSULIN ASPART 100 UNIT/ML ~~LOC~~ SOLN: 0.0000 [IU] | Freq: Every day | SUBCUTANEOUS | Status: DC

## 2019-07-07 MED ORDER — OSMOLITE 1.5 CAL PO LIQD
1000.0000 mL | ORAL | Status: DC
  Administered 2019-07-07: 14:00:00 1000 mL
  Filled 2019-07-07 (×2): qty 1000

## 2019-07-07 MED ORDER — ADULT MULTIVITAMIN LIQUID CH
15.0000 mL | Freq: Every day | ORAL | Status: DC
  Administered 2019-07-07: 15 mL
  Filled 2019-07-07: qty 15

## 2019-07-07 MED ORDER — VITAL HIGH PROTEIN PO LIQD: 1000.0000 mL | ORAL | Status: DC

## 2019-07-07 NOTE — Progress Notes (Signed)
SLP Cancellation Note  Patient Details Name: BREYLEN AGYEMAN MRN: [id number] DOB: 1961/02/09   Cancelled treatment:       Reason Eval/Treat Not Completed: Other (comment)(per MD note, pt remains lethargic, has small bore feeding tube)  Kathleen Lime, MS [facility] SLP Acute Rehab Services Office [phone]   Macario Golds 07/07/2019, 11:10 AM

## 2019-07-07 NOTE — Progress Notes (Signed)
Patient ID: Jeffery [name], male   DOB: 02-Aug-1960, 59 y.o.   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  He was febrile and tachycardic in the ED with hemoglobin of 6.4 and was transfused 2 unit packed red cells; along with creatinines of 3.54.  CT renal study on 06/26/2019 showed invasive prostate cancer into the bladder with severe bilateral hydronephrosis for which Foley catheter was placed.  Patient was admitted under [facility] service and urology was consulted.  Patient was initially reluctant to have nephrostomy tube placed.  Repeated images showed persistent severe bilateral hydronephrosis despite indwelling Foley catheter.  Urology and nephrology again recommended bilateral nephrostomy tube placement by IR.  Patient initially declined again but then agreed to after talking to his son.  IR placed bilateral nephrostomy tubes on 07/03/2019.  Per oncology, his cancer is not curable.  Per nephrology, he is not a good candidate for hemodialysis palliative care team was also consulted.  Blood cultures were positive for Enterococcus faecalis on 07/12/2019, most likely from urinary source.  On IV ampicillin per infectious diseases until 07/09/2019.  Repeat blood cultures from 06/26/2019 - so far.  TEE completed on 06/16/2019 which showed no evidence of vegetation/endocarditis  Hospital course complicated by intermittent high-grade fevers, gross hematuria and drop in hemoglobin.  He has been transfused with multiple units of packed red cells during this hospitalization and also subsequently required  intravenous Lasix intermittently.  Still having intermittent hematuria from nephrostomy tube.  He was also treated with IV Rocephin and Zithromax on 06/30/2019 due to concern for possible pneumonia and completed 5 days course on 07/04/2019  On 07/03/2019, patient had acute hypovolemic hyponatremia which worsened with serum sodium peaked at 167.  He was restarted on D5 water.  Nephrology was reconsulted.  Currently, nephrology following.  Assessment & Plan:   Sepsis: Present on admission Enterococcus faecalis bacteremia Possibly community-acquired pneumonia -Patient is still spiking temperatures with T-max of 102.1.  Currently on IV ampicillin for Enterococcus faecalis bacteremia as per ID recommendations with end date being 07/09/2019.  Repeat blood cultures from 06/26/2019 have been negative so far.  TEE was negative for vegetation.  ID has signed off.  Colonoscopy once the patient is hemodynamically stable. -We will repeat cultures along with chest x-ray. -Patient also has completed 5 days of IV Rocephin and Zithromax for CAP on 07/04/2019 -Blood pressure is on the lower side.  Continue IV fluids.  If remains hypotensive, will consult PCCM  Acute metabolic encephalopathy -Probably from above.  Patient still very drowsy.  If mental status does not improve, might get CT of the brain/MRI.  Fall precautions.  Acute hypovolemic hypernatremia -Suspect secondary to massive urinary losses from nephrostomy drains -Serum sodium increased up to 167: Now trending down.  149 today.  Nephrology following.  IV fluids as per nephrology recommendations  Dysphagia -Reassessed by speech therapist with recommendation for n.p.o., finger ice chips, and teaspoons water only when fully alert. -Status post cortrack placement on 07/06/2019 -Aspiration precautions -Elevate head of bed greater than 30 degrees  Acute blood loss anemia Anemia of chronic disease -From nephrostomy tubes, Foley catheter in gross  material -  Has had multiple units of packed red cells transfusion during this hospitalization. -Hemoglobin 7.4 today.  Monitor and transfuse if hemoglobin less than 7.  Still having bloody output from nephrostomy tubes and Foley catheter.  Severe bilateral hydronephrosis in the setting of obstruction from progressive enlarging bladder prostate mass status post bilateral nephrostomy tube placement by IR on 07/03/2019 -IR managing nephrostomy tubes.  Seen by urology, Dr. Gloriann Loan on 06/29/2019.  Acute kidney injury on chronic kidney disease stage IIIb -Baseline creatinine around 2.0.  Presented with creatinine of 3.8.  Creatinine 2.41 today.  Nephrology following. -Strict input and output.  Daily weights.  Refractory sinus tachycardia -Still tachycardic.  Continue metoprolol as needed.  Will DC scheduled metoprolol because of hypotension this morning. -Bilateral lower extremity Dopplers was negative for DVT on 06/28/2019  Acute hypoxic respiratory failure secondary to pulmonary edema, atelectasis -Requiring 3 to 5 L oxygen via nasal cannula. -Incentive spirometry once more awake  Severe protein calorie malnutrition -Nutrition following.  Cortrak placed on 07/06/2019  Left lower extremity pain -Chronic left lower extremity weakness with worsening pain.  Bilateral Doppler ultrasound negative for DVT  Generalized deconditioning/severe physical debility/overall very poor prognosis -We will reconsult palliative care for goals of care discussion. -Continue PT/OT follow-up  C1 lytic lesion -From CT head 3/10: Sclerotic and mixed lytic changes of C1, the skull base and calvarium consistent with the patient's known osseous metastatic disease. -Monitor  Diabetes mellitus type II with hyperglycemia -A1c 6.6 on 06/24/2019 -Monitor CBGs with SSI  Metastatic prostate cancer -Zytiga on hold.  Outpatient follow-up with oncology.  Per oncology, his cancer is not curable.   DVT prophylaxis: SCDs due to  gross hematuria and acute blood loss Code Status: Full Family Communication: Spoke to son/[name] on phone on 07/07/2019 Disposition Plan: Clinically very unstable for discharge.  Unclear about the disposition plan at this time.  Currently very encephalopathic with ongoing hematuria, fevers.  Consultants: Urology/GI/oncology/palliative care/nephrology/cardiology/IR/ID  Procedures: Bilateral nephrostomy tubes placement on 07/03/2019 TEE on 07/01/2019  Antimicrobials:  Anti-infectives (From admission, onward)   Start     Dose/Rate Route Frequency Ordered Stop   07/05/19 2000  ampicillin (OMNIPEN) 2 g in sodium chloride 0.9 % 100 mL IVPB     2 g 300 mL/hr over 20 Minutes Intravenous Every 6 hours 07/05/19 1350 07/09/19 2359   07/04/19 1200  azithromycin (ZITHROMAX) 500 mg in sodium chloride 0.9 % 250 mL IVPB     500 mg 250 mL/hr over 60 Minutes Intravenous  Once 07/04/19 1119 07/04/19 1300   06/30/19 0615  cefTRIAXone (ROCEPHIN) 2 g in sodium chloride 0.9 % 100 mL IVPB  Status:  Discontinued     2 g 200 mL/hr over 30 Minutes Intravenous Daily 06/30/19 0541 07/04/19 1119   06/30/19 0615  azithromycin (ZITHROMAX) 500 mg in sodium chloride 0.9 % 250 mL IVPB  Status:  Discontinued     500 mg 250 mL/hr over 60 Minutes Intravenous Daily 06/30/19 0542 07/04/19 1119   06/27/19 1800  cefTRIAXone (ROCEPHIN) 2 g in sodium chloride 0.9 % 100 mL IVPB  Status:  Discontinued     2 g 200 mL/hr over 30 Minutes Intravenous Every 24 hours 06/27/19 1635 06/28/19 1426   07/07/2019 1400  ampicillin (OMNIPEN) 2 g in sodium chloride 0.9 % 100 mL IVPB  Status:  Discontinued     2 g 300 mL/hr over 20 Minutes Intravenous Every 8 hours 07/11/2019 0612 07/05/19 1350   06/15/2019 0115  ampicillin (OMNIPEN) 2 g in sodium  chloride 0.9 % 100 mL IVPB  Status:  Discontinued     2 g 300 mL/hr over 20 Minutes Intravenous Every 6 hours 07/04/2019 0107 06/30/2019 0611   06/24/19 0800  cefTRIAXone (ROCEPHIN) 2 g in sodium chloride 0.9 %  100 mL IVPB  Status:  Discontinued     2 g 200 mL/hr over 30 Minutes Intravenous Every 24 hours 06/24/19 0722 06/29/2019 0104   06/28/2019 1700  cefTRIAXone (ROCEPHIN) 1 g in sodium chloride 0.9 % 100 mL IVPB     1 g 200 mL/hr over 30 Minutes Intravenous  Once 06/26/2019 1652 06/19/2019 1949       Subjective: Patient seen and examined at bedside.  He is sleepy, wakes up only very slightly, hardly answers any questions.  Still spiking fevers.  No overnight vomiting reported.  Objective: Vitals:   07/07/19 0400 07/07/19 0500 07/07/19 0600 07/07/19 0800  BP: 122/60 (!) 106/44 (!) 87/48   Pulse: (!) 139 (!) 139 (!) 137   Resp: (!) 28 (!) 27 (!) 33   Temp:    99.3 F (37.4 C)  TempSrc:    Oral  SpO2: 100% 100% 100%   Weight:  104.1 kg    Height:        Intake/Output Summary (Last 24 hours) at 07/07/2019 0952 Last data filed at 07/07/2019 7867 Gross per 24 hour  Intake 5297.57 ml  Output 5500 ml  Net -202.43 ml   Filed Weights   07/03/19 0500 07/04/19 0500 07/07/19 0500  Weight: 99 kg 97.8 kg 104.1 kg    Examination:  General exam: Looks chronically ill and deconditioned.  Sleepy, wakes up very slightly, hardly answers any questions. Respiratory system: Bilateral decreased breath sounds at bases with scattered crackles Cardiovascular system: S1 & S2 heard, tachycardic Gastrointestinal system: Abdomen is slightly distended, soft and nontender. Normal bowel sounds heard. Extremities: No cyanosis, clubbing; trace lower extremity edema present Central nervous system: Very drowsy.  No focal neurological deficits. Moving extremities Skin: No rashes, lesions or ulcers Psychiatry: Could not be assessed because of mental status Genitourinary: Bilateral nephrostomy tubes with slightly bloody drainage present along with Foley catheter    Data Reviewed: I have personally reviewed following labs and imaging studies  CBC: Recent Labs  Lab 07/03/19 0142 07/03/19 0142 07/04/19 0509  07/04/19 0509 07/05/19 0436 07/05/19 0739 07/05/19 1902 07/06/19 0032 07/07/19 0230  WBC 8.4  --  8.2  --  7.6  --   --  8.3 8.9  NEUTROABS 5.8  --  5.5  --  5.0  --   --  5.4 6.6  HGB 7.8*   < > 7.6*   < > 6.8* 7.2* 8.9* 8.6* 7.4*  HCT 28.1*   < > 28.4*   < > 25.2* 26.8* 31.8* 30.4* 26.6*  MCV 94.9  --  95.9  --  98.4  --   --  94.7 96.4  PLT 173  --  153  --  153  --   --  139* 140*   < > = values in this interval not displayed.   Basic Metabolic Panel: Recent Labs  Lab 07/03/19 0142 07/03/19 1652 07/05/19 1902 07/05/19 2118 07/06/19 0032 07/06/19 0721 07/07/19 0230  NA 153*   < > 155* 151* 153* 149* 149*  K 3.9   < > 4.0 3.8 3.8 3.8 3.9  CL 117*   < > 118* 117* 117* 115* 117*  CO2 26   < > 25 24 26 24  23  GLUCOSE 182*   < > 165* 167* 152* 164* 191*  BUN 50*   < > 44* 41* 43* 40* 45*  CREATININE 3.20*   < > 2.26* 2.33* 2.29* 2.25* 2.41*  CALCIUM 9.7   < > 10.6* 10.1 10.4* 10.4* 10.4*  MG 2.1  --   --   --   --   --   --   PHOS 4.2  --   --   --   --   --   --    < > = values in this interval not displayed.   GFR: Estimated Creatinine Clearance: 40.7 mL/min (A) (by C-G formula based on SCr of 2.41 mg/dL (H)). Liver Function Tests: Recent Labs  Lab 07/01/19 0209 07/05/19 0701 07/06/19 0721 07/07/19 0230  AST 35 35 46* 36  ALT 21 26 27 23   ALKPHOS 145* 149* 185* 233*  BILITOT 0.3 0.3 0.6 0.6  PROT 5.5* 6.1* 5.7* 5.6*  ALBUMIN 1.5* 1.5* 1.5* 1.5*   No results for input(s): LIPASE, AMYLASE in the last 168 hours. No results for input(s): AMMONIA in the last 168 hours. Coagulation Profile: Recent Labs  Lab 07/03/19 0142  INR 1.3*   Cardiac Enzymes: No results for input(s): CKTOTAL, CKMB, CKMBINDEX, TROPONINI in the last 168 hours. BNP (last 3 results) No results for input(s): PROBNP in the last 8760 hours. HbA1C: No results for input(s): HGBA1C in the last 72 hours. CBG: Recent Labs  Lab 07/06/19 1924 07/06/19 2125 07/06/19 2322 07/07/19 0124  07/07/19 0424  GLUCAP 144* 153* 160* 186* 171*   Lipid Profile: No results for input(s): CHOL, HDL, LDLCALC, TRIG, CHOLHDL, LDLDIRECT in the last 72 hours. Thyroid Function Tests: No results for input(s): TSH, T4TOTAL, FREET4, T3FREE, THYROIDAB in the last 72 hours. Anemia Panel: No results for input(s): VITAMINB12, FOLATE, FERRITIN, TIBC, IRON, RETICCTPCT in the last 72 hours. Sepsis Labs: Recent Labs  Lab 07/03/19 0142  PROCALCITON 11.60    No results found for this or any previous visit (from the past 240 hour(s)).       Radiology Studies: DG Abd 1 View  Result Date: 07/06/2019 CLINICAL DATA:  NG tube placement. EXAM: ABDOMEN - 1 VIEW COMPARISON:  CT abdomen and pelvis 06/29/2019 FINDINGS: An enteric tube terminates over the proximal to mid gastric body. Bilateral percutaneous nephrostomy tubes are present. No dilated loops of bowel are seen to suggest obstruction although the right lower abdomen and pelvis were not imaged. Widespread heterogeneous sclerosis of the included osseous structures reflects known metastatic prostate cancer. IMPRESSION: Enteric tube in the gastric body. Electronically Signed   By: Logan Bores M.D.   On: 07/06/2019 10:29        Scheduled Meds: . Chlorhexidine Gluconate Cloth  6 each Topical Daily  . feeding supplement (ENSURE ENLIVE)  237 mL Oral TID BM  . feeding supplement (PRO-STAT SUGAR FREE 64)  30 mL Oral BID  . gabapentin  100 mg Per Tube BID  . mouth rinse  15 mL Mouth Rinse BID  . multivitamin  15 mL Per Tube Daily  . pantoprazole (PROTONIX) IV  40 mg Intravenous Q24H  . protein supplement  1 Scoop Oral TID WC   Continuous Infusions: . sodium chloride 100 mL/hr at 07/07/19 0628  . ampicillin (OMNIPEN) IV 2 g (07/07/19 0939)  . dextrose 150 mL/hr at 07/07/19 9767  . insulin 1.7 mL/hr at 07/07/19 3419          Aline August, MD [facility] 07/07/2019, 9:52  AM

## 2019-07-07 NOTE — Progress Notes (Signed)
Nutrition Follow-up  DOCUMENTATION CODES:   Obesity unspecified  INTERVENTION:  - will order TF regimen: Osmolite 1.5 @ 25 ml/hr to advance by 10 ml every 12 hours to reach goal rate of 65 ml/hr with 30 ml prostat BID. - at goal rate, this regimen will provide 2540 kcal, 128 grams protein, and 1189 ml free water.  Monitor magnesium, potassium, and phosphorus daily for at least 3 days, MD to replete as needed, as pt is at risk for refeeding syndrome given inadequate nutrition for >2 weeks.    NUTRITION DIAGNOSIS:   Increased nutrient needs related to acute illness, chronic illness, cancer and cancer related treatments as evidenced by estimated needs. -ongoing  GOAL:   Patient will meet greater than or equal to 90% of their needs -unmet at this time  MONITOR:   PO intake, Supplement acceptance, Labs, Weight trends  ASSESSMENT:   59 year old gentleman with stage IV prostate cancer, type 2 DM, hyperlipidemia, chronic anemia, and stage 3 CKD. He presented to the ED with generalized weakness, hypoxia, tachycardia, and painful urination. He was working with therapy at home and was shaky, tremulous, and went to Oncologist's office who then directed him to the ED. CT showed invasive prostate cancer with bladder mets and severe bilateral hydronephrosis; Foley inserted.  Patient noted to be a/o to self and place. He was sleeping at the time of visit and his mom was at bedside. Introduced self to mom and informed her of plan for TF initiation. Mom reports that patient's son is on the way. She states that she has been talking with Palliative Care and is interested in talking with Palliative Care again today. She expresses understanding of the seriousness of his illness and that he is unlikely to survive or to be able to go home. Provided active listening.  Small bore NGT placed in L nare 3/23. Will order TF as outlined above and continue to monitor for plans concerning GOC. Patient remains Full  Code at this time. Mom expressed she thinks it is time to transition to DNR.   Nephrology is following. Weight was fairly stable from admission (3/11) until 3/21 and has been trending up since 3/21. Noted both IV fluids are currently running and would provide a total of 6L over a 24 hour period.    Labs reviewed; CBGs: 186 and 171 mg/dl, Na: 149 mmol/l, BUN: 45 mg/dl, creatinine: 2.41 mg/dl, Alk Phos elevated, GFR: 29 ml/min. Medications reviewed; 15 ml multivitamin/day, 40 mg IV protonix/day.  IVF; 1/2 NS @ 100 ml/hr; D5 @ 150 ml/hr until 1930 today.   Diet Order:   Diet Order            Diet NPO time specified  Diet effective now              EDUCATION NEEDS:   Not appropriate for education at this time  Skin:  Skin Assessment: Reviewed RN Assessment  Last BM:  3/23  Height:   Ht Readings from Last 1 Encounters:  06/24/19 5' 9" (1.753 m)    Weight:   Wt Readings from Last 1 Encounters:  07/07/19 104.1 kg    Ideal Body Weight:  72.7 kg  BMI:  Body mass index is 33.89 kg/m.  Estimated Nutritional Needs:   Kcal:  2500-2700 kcal  Protein:  125-140 grams  Fluid:  >/= 2.5     Jarome Matin, MS, RD, LDN, CNSC Inpatient Clinical Dietitian RD pager # available in [facility]  After hours/weekend pager # available  in [facility]

## 2019-07-07 NOTE — Progress Notes (Signed)
[facility] NEPHROLOGY PROGRESS NOTE  Assessment/ Plan: Pt is a 59 y.o. yo male with metastatic prostate cancer, DM, HLD, CKD, CT scan with invasive prostate cancer into the bladder with severe bilateral hydronephrosis status post nephrostomy tube placement, seen for AKI.  #AKI on CKD due to obstructive uropathy: Bilateral ureteral obstruction with severe hydronephrosis from prostate ca metastasis, status post bilateral PCN done on 3/20.  Creatinine level was around 3-3.5 prior, now stable around 2.2-2.4 after PCN placement.  Monitor lab.  Continue IV fluid as patient has polyuria.  #Hypernatremia due to postobstructive diuresis: Continue replacement fluid with half NS and D5W.  Serum sodium level stable at 149.   Monitor lab.  #Sepsis/Enterococcus bacteremia: On ampicillin.  Echo with no vegetation.  ID following.  #Prostate cancer with metastasis, bladder obstruction, debility and chronic anemia: Follow with oncology.  #Acute metabolic encephalopathy//debility: Continue current management, avoid sedatives.  No improvement in mental status.  #Anemia of critical illness: PRBC as needed.  Monitor hemoglobin.  #disp: Given metastatic prostate cancer and multiple comorbidities patient has poor long-term prognosis.  Patient is now DNR.  Agree with palliative care/hospice consult.  I have discussed this with the patient's mother and also with the primary team.  Subjective: Patient was seen and examined in ICU.  Remains confused and lethargic.  Review of system limited.  Urine output is recorded 7 liters in last 24 hours.  No other event except patient is now DNR. Objective Vital signs in last 24 hours: Vitals:   07/07/19 0500 07/07/19 0600 07/07/19 0800 07/07/19 1200  BP: (!) 106/44 (!) 87/48    Pulse: (!) 139 (!) 137    Resp: (!) 27 (!) 33    Temp:   99.3 F (37.4 C) 99.9 F (37.7 C)  TempSrc:   Oral Axillary  SpO2: 100% 100%    Weight: 104.1 kg     Height:       Weight  change:   Intake/Output Summary (Last 24 hours) at 07/07/2019 1329 Last data filed at 07/07/2019 1200 Gross per 24 hour  Intake 4845.85 ml  Output 7375 ml  Net -2529.15 ml       Labs: Basic Metabolic Panel: Recent Labs  Lab 07/03/19 0142 07/03/19 1652 07/06/19 0721 07/07/19 0230 07/07/19 1113  NA 153*   < > 149* 149* 149*  K 3.9   < > 3.8 3.9 4.0  CL 117*   < > 115* 117* 116*  CO2 26   < > 24 23 27   GLUCOSE 182*   < > 164* 191* 189*  BUN 50*   < > 40* 45* 42*  CREATININE 3.20*   < > 2.25* 2.41* 2.36*  CALCIUM 9.7   < > 10.4* 10.4* 10.4*  PHOS 4.2  --   --   --  4.7*   < > = values in this interval not displayed.   Liver Function Tests: Recent Labs  Lab 07/06/19 0721 07/07/19 0230 07/07/19 1113  AST 46* 36 36  ALT 27 23 23   ALKPHOS 185* 233* 226*  BILITOT 0.6 0.6 0.4  PROT 5.7* 5.6* 5.5*  ALBUMIN 1.5* 1.5* 1.4*   No results for input(s): LIPASE, AMYLASE in the last 168 hours. Recent Labs  Lab 07/07/19 1113  AMMONIA 28   CBC: Recent Labs  Lab 07/04/19 0509 07/04/19 0509 07/05/19 0436 07/05/19 0739 07/06/19 0032 07/07/19 0230 07/07/19 1113  WBC 8.2   < > 7.6   < > 8.3 8.9 8.3  NEUTROABS  5.5   < > 5.0   < > 5.4 6.6 6.6  HGB 7.6*   < > 6.8*   < > 8.6* 7.4* 7.4*  HCT 28.4*   < > 25.2*   < > 30.4* 26.6* 26.2*  MCV 95.9  --  98.4  --  94.7 96.4 96.0  PLT 153   < > 153   < > 139* 140* 155   < > = values in this interval not displayed.   Cardiac Enzymes: No results for input(s): CKTOTAL, CKMB, CKMBINDEX, TROPONINI in the last 168 hours. CBG: Recent Labs  Lab 07/07/19 0619 07/07/19 0823 07/07/19 1032 07/07/19 1206 07/07/19 1315  GLUCAP 164* 173* 183* 165* 153*    Iron Studies: No results for input(s): IRON, TIBC, TRANSFERRIN, FERRITIN in the last 72 hours. Studies/Results: DG Abd 1 View  Result Date: 07/06/2019 CLINICAL DATA:  NG tube placement. EXAM: ABDOMEN - 1 VIEW COMPARISON:  CT abdomen and pelvis 06/29/2019 FINDINGS: An enteric tube  terminates over the proximal to mid gastric body. Bilateral percutaneous nephrostomy tubes are present. No dilated loops of bowel are seen to suggest obstruction although the right lower abdomen and pelvis were not imaged. Widespread heterogeneous sclerosis of the included osseous structures reflects known metastatic prostate cancer. IMPRESSION: Enteric tube in the gastric body. Electronically Signed   By: Logan Bores M.D.   On: 07/06/2019 10:29   DG CHEST PORT 1 VIEW  Result Date: 07/07/2019 CLINICAL DATA:  Dyspnea. EXAM: PORTABLE CHEST 1 VIEW COMPARISON:  Chest CT 06/29/2019, chest radiograph 06/27/2019 FINDINGS: An enteric tube passes below the level of the left hemidiaphragm with tip excluded from the field of view. Shallow inspiration radiograph with accentuation of the cardiac silhouette. Heart size likely within normal limits. Similar appearance of small to moderate bilateral pleural effusions with bibasilar atelectasis. Additional ill-defined opacity within the lateral aspect of the mid to lower right lung field likely reflects the atelectasis demonstrated on prior chest CT. Redemonstrated diffuse osseous metastases. IMPRESSION: An enteric tube passes below the level of the left hemidiaphragm with tip excluded from the field of view. Otherwise, no appreciable interval change as compared to 06/29/2019. Persistent small to moderate bilateral pleural effusions with bibasilar atelectasis. Additional ill-defined opacity within the lateral aspect of the mid to lower right lung field likely corresponds with the atelectasis demonstrated on recent prior chest CT. Electronically Signed   By: Kellie Simmering DO   On: 07/07/2019 10:37    Medications: Infusions: . sodium chloride    . ampicillin (OMNIPEN) IV Stopped (07/07/19 1224)  . dextrose 150 mL/hr at 07/07/19 1032  . insulin 1.7 mL/hr at 07/07/19 8676    Scheduled Medications: . Chlorhexidine Gluconate Cloth  6 each Topical Daily  . feeding  supplement (OSMOLITE 1.5 CAL)  1,000 mL Per Tube Q24H  . feeding supplement (PRO-STAT SUGAR FREE 64)  30 mL Per Tube BID  . gabapentin  100 mg Per Tube BID  . insulin aspart  0-5 Units Subcutaneous QHS  . insulin aspart  0-9 Units Subcutaneous TID WC  . insulin glargine  10 Units Subcutaneous Q24H  . mouth rinse  15 mL Mouth Rinse BID  . multivitamin  15 mL Per Tube Daily  . pantoprazole (PROTONIX) IV  40 mg Intravenous Q24H    have reviewed scheduled and prn medications.  Physical Exam: General: Ill-looking lethargic male, not in distress Heart:RRR, s1s2 nl, no rubs Lungs: Bibasal decreased breath sound, no wheezing Abdomen:soft, Non-tender, non-distended Extremities:No pitting edema  GU: Bilateral nephrostomy tube  Rosita Fire 07/07/2019,1:29 PM  LOS: 14 days  Pager: [phone]

## 2019-07-07 NOTE — Progress Notes (Addendum)
From charge report received that patient is heading toward comfort care but family has not made that decision as of yet.  Dayshift charge also advised that we are allowing visitors per the palliative / comfort care visitor guidelines.  At 2040, noticed 6 to 8 visitors in room and hallway, in to assess situation.  Talked with Kathlyn [place] (patient's son), he advised he knew he was over the limit of three visitors and he would try to get it decreased to three.  Patient's mother, Vivi Ferns, who is the main point of contact, was in the room.  I had a discussion with her regarding what she would like regarding the patient's plan of care.  Hassan Rowan advised she just wanted to keep him comfortable.  I discussed comfort care measures, meaning that we would move to a keeping him as comfortable as possible rather than a curative plan of care.  Hassan Rowan again agreed that comfort measures is what she would like.  She did state that the son wanted to leave the tube feedings going.  Paged [facility] NP, Blount, advised her of the situation, comfort care orders placed, and Blount advised that tube feeds were ok to keep going.  Family visited until roughly 2130, all went home.  I advised that we would call if any changes throughout the night.  [place] / Care Coordinator / Rapid Response Nurse ICU/SD Unit (2 [facility]) [place], [facility] Rapid Response Number:  [id number] ICU Charge Nurse Number:  [phone]

## 2019-07-07 NOTE — Progress Notes (Signed)
OT Cancellation Note  Patient Details Name: Jeffery [name] MRN: [id number] DOB: January 05, 1961   Cancelled Treatment:    Reason Eval/Treat Not Completed: Fatigue/lethargy limiting ability to participate. Will attempt OT tx session on different date and time.   Shivali Quackenbush 07/07/2019, 1:48 PM

## 2019-07-07 NOTE — Progress Notes (Signed)
[facility] Documentation  Pt was scheduled to be admitted into [place] program om 06/26/2019. [facility] will reschedule admission and liaison will follow to ensure follow up upon discharge.   Please reach out to [facility] should pt's needs change and he becomes hospice eligible. We are happy to admit him under hospice care pending his goals of care.   Thank you,   Farrel Gordon, RN  [facility] Liaison  [phone]

## 2019-07-08 ENCOUNTER — Inpatient Hospital Stay: Payer: Medicaid Other | Admitting: Internal Medicine

## 2019-07-08 LAB — URINE CULTURE: Culture: NO GROWTH

## 2019-07-08 MED ORDER — GLYCOPYRROLATE 0.2 MG/ML IJ SOLN
0.2000 mg | INTRAMUSCULAR | Status: DC | PRN
  Administered 2019-07-08: 11:00:00 0.2 mg via INTRAVENOUS
  Filled 2019-07-08: qty 1

## 2019-07-08 MED ORDER — LORAZEPAM 2 MG/ML PO CONC
1.0000 mg | ORAL | Status: DC | PRN
  Filled 2019-07-08: qty 0.5

## 2019-07-08 MED ORDER — GLYCOPYRROLATE 1 MG PO TABS: 1.0000 mg | ORAL_TABLET | ORAL | Status: DC | PRN

## 2019-07-08 MED ORDER — LORAZEPAM 2 MG/ML IJ SOLN: 1.0000 mg | INTRAMUSCULAR | Status: DC | PRN

## 2019-07-08 MED ORDER — HALOPERIDOL 0.5 MG PO TABS: 0.5000 mg | ORAL_TABLET | ORAL | Status: DC | PRN

## 2019-07-08 MED ORDER — LORAZEPAM 1 MG PO TABS: 1.0000 mg | ORAL_TABLET | ORAL | Status: DC | PRN

## 2019-07-08 MED ORDER — MORPHINE BOLUS VIA INFUSION
2.0000 mg | INTRAVENOUS | Status: DC | PRN
  Administered 2019-07-08 (×2): 2 mg via INTRAVENOUS
  Filled 2019-07-08: qty 2

## 2019-07-08 MED ORDER — HALOPERIDOL LACTATE 5 MG/ML IJ SOLN: 0.5000 mg | INTRAMUSCULAR | Status: DC | PRN

## 2019-07-08 MED ORDER — HALOPERIDOL LACTATE 2 MG/ML PO CONC
0.5000 mg | ORAL | Status: DC | PRN
  Filled 2019-07-08: qty 0.3

## 2019-07-08 MED ORDER — MORPHINE 100MG IN NS 100ML (1MG/ML) PREMIX INFUSION
1.0000 mg/h | INTRAVENOUS | Status: DC
  Administered 2019-07-08: 12:00:00 2 mg/h via INTRAVENOUS
  Filled 2019-07-08: qty 100

## 2019-07-08 MED ORDER — GLYCOPYRROLATE 0.2 MG/ML IJ SOLN: 0.2000 mg | INTRAMUSCULAR | Status: DC | PRN

## 2019-07-12 LAB — CULTURE, BLOOD (ROUTINE X 2)
Culture: NO GROWTH
Culture: NO GROWTH
Special Requests: ADEQUATE

## 2019-07-13 ENCOUNTER — Other Ambulatory Visit: Payer: Self-pay | Admitting: Internal Medicine

## 2019-07-15 NOTE — Progress Notes (Addendum)
OT Discharge Note  Patient Details Name: Jeffery [name] MRN: [id number] DOB: June 16, 1960   Patient discharged from OT services secondary to medical decline. Now on comfort care.   Please see latest therapy progress note for current level of functioning and progress toward goals.    Progress and discharge plan discussed with patient and/or caregiver: NA GP       SLP concurs and will also sign off.      Brandi  Buggs 31-Jul-2019, 8:00 AM   Kathleen Lime, MS [place] Office [phone]

## 2019-07-15 NOTE — Progress Notes (Signed)
Patient ID: Jeffery [name], male   DOB: [birth year], 59 y.o.   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  He was febrile and tachycardic in the ED with hemoglobin of 6.4 and was transfused 2 unit packed red cells; along with creatinines of 3.54.  CT renal study on 06/29/2019 showed invasive prostate cancer into the bladder with severe bilateral hydronephrosis for which Foley catheter was placed.  Patient was admitted under [facility] service and urology was consulted.  Patient was initially reluctant to have nephrostomy tube placed.  Repeated images showed persistent severe bilateral hydronephrosis despite indwelling Foley catheter.  Urology and nephrology again recommended bilateral nephrostomy tube placement by IR.  Patient initially declined again but then agreed to after talking to his son.  IR placed bilateral nephrostomy tubes on 07/03/2019.  Per oncology, his cancer is not curable.  Per nephrology, he is not a good candidate for hemodialysis palliative care team was also consulted.  Blood cultures were positive for Enterococcus faecalis on 07/03/2019, most likely from urinary source.  On IV ampicillin per infectious diseases until 07/09/2019.  Repeat blood cultures from 06/26/2019 - so far.  TEE completed on 06/17/2019 which showed no evidence of vegetation/endocarditis  Hospital course complicated by intermittent high-grade fevers, gross hematuria and drop in hemoglobin.  He has been transfused with multiple units of packed red cells during this hospitalization and also subsequently  required intravenous Lasix intermittently.  Still having intermittent hematuria from nephrostomy tube.  He was also treated with IV Rocephin and Zithromax on 06/30/2019 due to concern for possible pneumonia and completed 5 days course on 07/04/2019  On 07/03/2019, patient had acute hypovolemic hyponatremia which worsened with serum sodium peaked at 167.  He was restarted on D5 water.  Nephrology was reconsulted.    Assessment & Plan:   Sepsis: Present on admission Enterococcus faecalis bacteremia Possibly community-acquired pneumonia Acute metabolic encephalopathy Acute hypovolemic hypernatremia Dysphagia Acute blood loss anemia Anemia of chronic disease Severe bilateral hydronephrosis in the setting of obstruction from progressive enlarging bladder prostate mass status post bilateral nephrostomy tube placement by IR on 07/03/2019 Acute kidney injury on chronic kidney disease stage IIIb Refractory sinus tachycardia Acute hypoxic respiratory failure secondary to pulmonary edema, atelectasis Severe protein calorie malnutrition Left lower extremity pain Generalized deconditioning/severe physical debility/overall very poor prognosis C1 lytic lesion Diabetes mellitus type II with hyperglycemia Metastatic prostate cancer  Plan -Patient is still spiking temperatures, very tachycardic and encephalopathic and still on IV ampicillin and IV fluids.  I had a detailed conversation with patient's mother/Brenda Truett [place] on phone again on 07/25/2019 after having detailed conversation with son and mother on 07/07/2019 on phone.  Patient's family including his mother has agreed for total comfort measures.  At this time, I will stop all the antibiotics, IV fluids, any further labs and other medications.  We will start him on comfort measures with morphine drip.  DC telemetry.  Transfer to [place].  Expect inpatient hospital death.   DVT prophylaxis:  will DC SCDs Code Status: DNR Family Communication: Spoke to  mother/Brenda on 07-25-19 Disposition Plan: Expect inpatient death  Consultants: Urology/GI/oncology/palliative care/nephrology/cardiology/IR/ID  Procedures: Bilateral  nephrostomy tubes placement on 07/03/2019 TEE on 06/17/2019  Antimicrobials:  Anti-infectives (From admission, onward)   Start     Dose/Rate Route Frequency Ordered Stop   07/05/19 2000  ampicillin (OMNIPEN) 2 g in sodium chloride 0.9 % 100 mL IVPB     2 g 300 mL/hr over 20 Minutes Intravenous Every 6 hours 07/05/19 1350 07/09/19 2359   07/04/19 1200  azithromycin (ZITHROMAX) 500 mg in sodium chloride 0.9 % 250 mL IVPB     500 mg 250 mL/hr over 60 Minutes Intravenous  Once 07/04/19 1119 07/04/19 1300   06/30/19 0615  cefTRIAXone (ROCEPHIN) 2 g in sodium chloride 0.9 % 100 mL IVPB  Status:  Discontinued     2 g 200 mL/hr over 30 Minutes Intravenous Daily 06/30/19 0541 07/04/19 1119   06/30/19 0615  azithromycin (ZITHROMAX) 500 mg in sodium chloride 0.9 % 250 mL IVPB  Status:  Discontinued     500 mg 250 mL/hr over 60 Minutes Intravenous Daily 06/30/19 0542 07/04/19 1119   06/27/19 1800  cefTRIAXone (ROCEPHIN) 2 g in sodium chloride 0.9 % 100 mL IVPB  Status:  Discontinued     2 g 200 mL/hr over 30 Minutes Intravenous Every 24 hours 06/27/19 1635 06/28/19 1426   06/15/2019 1400  ampicillin (OMNIPEN) 2 g in sodium chloride 0.9 % 100 mL IVPB  Status:  Discontinued     2 g 300 mL/hr over 20 Minutes Intravenous Every 8 hours 06/28/2019 0612 07/05/19 1350   07/01/2019 0115  ampicillin (OMNIPEN) 2 g in sodium chloride 0.9 % 100 mL IVPB  Status:  Discontinued     2 g 300 mL/hr over 20 Minutes Intravenous Every 6 hours 06/20/2019 0107 06/26/2019 0611   06/24/19 0800  cefTRIAXone (ROCEPHIN) 2 g in sodium chloride 0.9 % 100 mL IVPB  Status:  Discontinued     2 g 200 mL/hr over 30 Minutes Intravenous Every 24 hours 06/24/19 0722 06/17/2019 0104   07/04/2019 1700  cefTRIAXone (ROCEPHIN) 1 g in sodium chloride 0.9 % 100 mL IVPB     1 g 200  mL/hr over 30 Minutes Intravenous  Once 06/22/2019 1652 07/04/2019 1949        Subjective: Patient seen and examined at bedside.  He is very drowsy, hardly wakes up on calling his name.  Still having fevers.  No overnight vomiting reported.  Objective: Vitals:   07-17-2019 0300 07-17-19 0400 07-17-2019 0500 07-17-19 0600  BP:      Pulse: (!) 153 (!) 147 (!) 156 (!) 149  Resp: (!) 37 (!) 40 (!) 38 (!) 39  Temp:      TempSrc:      SpO2: 95% 96% 93% 96%  Weight:      Height:        Intake/Output Summary (Last 24 hours) at 2019/07/17 1014 Last data filed at 2019-07-17 0700 Gross per 24 hour  Intake 3513.51 ml  Output 7035 ml  Net -3521.49 ml   Filed Weights   07/03/19 0500 07/04/19 0500 07/07/19 0500  Weight: 99 kg 97.8 kg 104.1 kg    Examination:  General exam: Very deconditioned.  Hardly wakes up on calling his name. ENT: Still has NG tube Respiratory system: Bilateral decreased breath sounds at bases with scattered crackles.  Tachypneic Cardiovascular system: S1 & S2 heard, extremely tachycardic Gastrointestinal system: Abdomen isdistended, soft and nontender. Normal bowel sounds heard. Extremities: No cyanosis, clubbing; trace lower extremity edema Central nervous system: Very drowsy, hardly wakes up on calling his  name.  No obvious focal deficits.   Skin: No ulcers or lesions Psychiatry: Could not be assessed because of mental status    Data Reviewed: I have personally reviewed following labs and imaging studies  CBC: Recent Labs  Lab 07/04/19 0509 07/04/19 0509 07/05/19 0436 07/05/19 0436 07/05/19 0739 07/05/19 1902 07/06/19 0032 07/07/19 0230 07/07/19 1113  WBC 8.2  --  7.6  --   --   --  8.3 8.9 8.3  NEUTROABS 5.5  --  5.0  --   --   --  5.4 6.6 6.6  HGB 7.6*   < > 6.8*   < > 7.2* 8.9* 8.6* 7.4* 7.4*  HCT 28.4*   < > 25.2*   < > 26.8* 31.8* 30.4* 26.6* 26.2*  MCV 95.9  --  98.4  --   --   --  94.7 96.4 96.0  PLT 153  --  153  --   --   --  139* 140* 155    < > = values in this interval not displayed.   Basic Metabolic Panel: Recent Labs  Lab 07/03/19 0142 07/03/19 1652 07/05/19 2118 07/06/19 0032 07/06/19 0721 07/07/19 0230 07/07/19 1113  NA 153*   < > 151* 153* 149* 149* 149*  K 3.9   < > 3.8 3.8 3.8 3.9 4.0  CL 117*   < > 117* 117* 115* 117* 116*  CO2 26   < > 24 26 24 23 27   GLUCOSE 182*   < > 167* 152* 164* 191* 189*  BUN 50*   < > 41* 43* 40* 45* 42*  CREATININE 3.20*   < > 2.33* 2.29* 2.25* 2.41* 2.36*  CALCIUM 9.7   < > 10.1 10.4* 10.4* 10.4* 10.4*  MG 2.1  --   --   --   --   --  1.7  PHOS 4.2  --   --   --   --   --  4.7*   < > = values in this interval not displayed.   GFR: Estimated Creatinine Clearance: 41.6 mL/min (A) (by C-G formula based on SCr of 2.36 mg/dL (H)). Liver Function Tests: Recent Labs  Lab 07/05/19 0701 07/06/19 0721 07/07/19 0230 07/07/19 1113  AST 35 46* 36 36  ALT 26 27 23 23   ALKPHOS 149* 185* 233* 226*  BILITOT 0.3 0.6 0.6 0.4  PROT 6.1* 5.7* 5.6* 5.5*  ALBUMIN 1.5* 1.5* 1.5* 1.4*   No results for input(s): LIPASE, AMYLASE in the last 168 hours. Recent Labs  Lab 07/07/19 1113  AMMONIA 28   Coagulation Profile: Recent Labs  Lab 07/03/19 0142  INR 1.3*   Cardiac Enzymes: No results for input(s): CKTOTAL, CKMB, CKMBINDEX, TROPONINI in the last 168 hours. BNP (last 3 results) No results for input(s): PROBNP in the last 8760 hours. HbA1C: No results for input(s): HGBA1C in the last 72 hours. CBG: Recent Labs  Lab 07/07/19 1032 07/07/19 1206 07/07/19 1315 07/07/19 1443 07/07/19 1655  GLUCAP 183* 165* 153* 165* 180*   Lipid Profile: No results for input(s): CHOL, HDL, LDLCALC, TRIG, CHOLHDL, LDLDIRECT in the last 72 hours. Thyroid Function Tests: No results for input(s): TSH, T4TOTAL, FREET4, T3FREE, THYROIDAB in the last 72 hours. Anemia Panel: No results for input(s): VITAMINB12, FOLATE, FERRITIN, TIBC, IRON, RETICCTPCT in the last 72 hours. Sepsis Labs: Recent Labs    Lab 07/03/19 0142 07/07/19 1113  PROCALCITON 11.60 13.42  LATICACIDVEN  --  1.5    Recent Results (from  the past 240 hour(s))  Culture, blood (routine x 2)     Status: None (Preliminary result)   Collection Time: 07/07/19 11:13 AM   Specimen: BLOOD  Result Value Ref Range Status   Specimen Description   Final    BLOOD LEFT ANTECUBITAL Performed at [facility] [address] [place], [place] [address]    Special Requests   Final    BOTTLES DRAWN AEROBIC AND ANAEROBIC Blood Culture results may not be optimal due to an excessive volume of blood received in culture bottles Performed at [facility] [address] [place], [place] [address]    Culture   Final    NO GROWTH < 24 HOURS Performed at [facility] [address] [place], [place] [address]    Report Status PENDING  Incomplete  Culture, blood (routine x 2)     Status: None (Preliminary result)   Collection Time: 07/07/19 11:13 AM   Specimen: BLOOD  Result Value Ref Range Status   Specimen Description   Final    BLOOD LEFT ANTECUBITAL Performed at [facility] [address], [place], [place] [address]    Special Requests   Final    BOTTLES DRAWN AEROBIC AND ANAEROBIC Blood Culture adequate volume Performed at [facility] [address], [place], [place] [address]    Culture   Final    NO GROWTH < 24 HOURS Performed at [facility] [address] [place], [place] [address]    Report Status PENDING  Incomplete         Radiology Studies: DG Abd 1 View  Result Date: 07/06/2019 CLINICAL DATA:  NG tube placement. EXAM: ABDOMEN - 1 VIEW COMPARISON:  CT abdomen and pelvis 06/29/2019 FINDINGS: An enteric tube terminates over the proximal to mid gastric body. Bilateral percutaneous nephrostomy tubes are present. No dilated loops of bowel are seen to suggest obstruction although the right lower abdomen and pelvis were not  imaged. Widespread heterogeneous sclerosis of the included osseous structures reflects known metastatic prostate cancer. IMPRESSION: Enteric tube in the gastric body. Electronically Signed   By: Logan Bores M.D.   On: 07/06/2019 10:29   DG CHEST PORT 1 VIEW  Result Date: 07/07/2019 CLINICAL DATA:  Dyspnea. EXAM: PORTABLE CHEST 1 VIEW COMPARISON:  Chest CT 06/29/2019, chest radiograph 06/27/2019 FINDINGS: An enteric tube passes below the level of the left hemidiaphragm with tip excluded from the field of view. Shallow inspiration radiograph with accentuation of the cardiac silhouette. Heart size likely within normal limits. Similar appearance of small to moderate bilateral pleural effusions with bibasilar atelectasis. Additional ill-defined opacity within the lateral aspect of the mid to lower right lung field likely reflects the atelectasis demonstrated on prior chest CT. Redemonstrated diffuse osseous metastases. IMPRESSION: An enteric tube passes below the level of the left hemidiaphragm with tip excluded from the field of view. Otherwise, no appreciable interval change as compared to 06/29/2019. Persistent small to moderate bilateral pleural effusions with bibasilar atelectasis. Additional ill-defined opacity within the lateral aspect of the mid to lower right lung field likely corresponds with the atelectasis demonstrated on recent prior chest CT. Electronically Signed   By: Kellie Simmering DO   On: 07/07/2019 10:37        Scheduled Meds: . Chlorhexidine Gluconate Cloth  6 each Topical Daily  . feeding supplement (OSMOLITE 1.5 CAL)  1,000 mL Per Tube Q24H  . feeding supplement (PRO-STAT SUGAR FREE 64)  30 mL Per Tube BID  . gabapentin  100 mg Per Tube BID  . insulin aspart  0-5 Units Subcutaneous QHS  . insulin aspart  0-9 Units Subcutaneous TID WC  . insulin glargine  10 Units Subcutaneous Q24H  . mouth rinse  15 mL Mouth Rinse BID  . multivitamin  15 mL Per Tube Daily  . pantoprazole  (PROTONIX) IV  40 mg Intravenous Q24H   Continuous Infusions: . ampicillin (OMNIPEN) IV Stopped (07/07/19 1447)          Aline August, MD [facility] July 14, 2019, 10:14 AM

## 2019-07-15 NOTE — Progress Notes (Signed)
Physical Therapy Discharge Patient Details Name: Jeffery [name] MRN: [id number] DOB: [birth year] Today's Date: 07/25/2019 Time:  -     Patient discharged from PT services secondary to medical decline - Now on comfort Care.  Please see latest therapy progress note for current level of functioning and progress toward goals.    Progress and discharge plan discussed with patient and/or caregiver: NA GP     Claretha Cooper July 25, 2019, 7:33 AM [place] Pager [phone] Office [phone]

## 2019-07-15 NOTE — Progress Notes (Signed)
Patient expired 2154. Family notified.

## 2019-07-15 NOTE — Progress Notes (Signed)
Referring Physician(s): Kayleen Memos  Supervising Physician: Sandi Mariscal  Patient Status:  [facility] - In-pt  Chief Complaint: None  Subjective:  History of metastatic prostate cancer with subsequent bilateral obstructive hydronephrosis s/p bilateral percutaneous nephrostomy tube placements in IR 07/03/2019 by Dr. Laurence Ferrari. Patient awake laying in bed. He does not respond to voice, converse, or follow simple commands. Family at bedside. Bilateral nephrostomy tube sites c/d/i.   Allergies: Patient has no known allergies.  Medications: Prior to Admission medications   Medication Sig Start Date End Date Taking? Authorizing Provider  Amino Acids-Protein Hydrolys (FEEDING SUPPLEMENT, PRO-STAT SUGAR FREE 64,) LIQD Take 30 mLs by mouth 3 (three) times daily. 10/14/18  Yes Hongalgi, Lenis Dickinson, MD  feeding supplement, ENSURE ENLIVE, (ENSURE ENLIVE) LIQD Take 237 mLs by mouth daily. 10/14/18  Yes Hongalgi, Lenis Dickinson, MD  finasteride (PROSCAR) 5 MG tablet Take 5 mg by mouth daily. 02/12/18  Yes [provider]  furosemide (LASIX) 40 MG tablet Take 40 mg by mouth daily.   Yes [provider]  gabapentin (NEURONTIN) 300 MG capsule Take 1-2 capsules (300-600 mg total) by mouth 3 (three) times daily. 35m in am and 6048min evening Patient taking differently: Take 600 mg by mouth 3 (three) times daily.  05/31/19  Yes Shadad, FiMathis DadMD  HYDROcodone-acetaminophen (NORCO/VICODIN) 5-325 MG tablet TAKE 1 TO 2 TABLETS BY MOUTH EVERY 4 TO 6 HOURS AS NEEDED FOR PAIN (MAX OF 8 TABLETS IN 24 HOURS) Patient taking differently: Take 1-2 tablets by mouth every 4 (four) hours as needed for moderate pain or severe pain. TAKE 1 TO 2 TABLETS BY MOUTH EVERY 4 TO 6 HOURS AS NEEDED FOR PAIN (MAX OF 8 TABLETS IN 24 HOURS) 06/15/19  Yes ShWyatt PortelaMD  metFORMIN (GLUCOPHAGE-XR) 500 MG 24 hr tablet Take 500-1,000 mg by mouth 2 (two) times daily. Take 2 tablets (1000 mg) in the am and Take 1 tablet at  bedtime 01/12/19  Yes [provider]  Multiple Vitamin (MULTIVITAMIN WITH MINERALS) TABS tablet Take 1 tablet by mouth daily. 10/15/18  Yes Hongalgi, AnLenis DickinsonMD  predniSONE (DELTASONE) 5 MG tablet Take 1 tablet (5 mg total) by mouth daily with breakfast. 11/03/18  Yes Shadad, FiMathis DadMD  tamsulosin (FLOMAX) 0.4 MG CAPS capsule Take 2 capsules (0.8 mg total) by mouth daily after supper. 10/14/18  Yes Hongalgi, AnLenis DickinsonMD  vitamin B-12 (CYANOCOBALAMIN) 1000 MCG tablet Take 1 tablet (1,000 mcg total) by mouth daily. 10/14/18  Yes Hongalgi, AnLenis DickinsonMD  ZYTIGA 250 MG tablet TAKE 4 TABLETS BY MOUTH ONCE DAILY AS DIRECTED.  TAKE 1 HOUR BEFORE OR 2 HOURS AFTER A MEAL Patient taking differently: Take 1,000 mg by mouth daily.  12/18/18  Yes ShWyatt PortelaMD  Blood Glucose Monitoring Suppl (RELION CONFIRM GLUCOSE MONITOR) w/Device KIT Use 4 times daily before meals and bedtime as directed. 07/31/16   StHolley RaringMD  glucose blood (RELION GLUCOSE TEST STRIPS) test strip Use as instructed 07/31/16   StHolley RaringMD  Lancets 30G MISC Use 4 times daily before meals as directed. 07/31/16   StHolley RaringMD     Vital Signs: BP (!) 116/47 (BP Location: Right Arm)   Pulse (!) 149   Temp (!) 101.9 F (38.8 C) (Axillary)   Resp (!) 34   Ht 5' 9"  (1.753 m)   Wt 229 lb 8 oz (104.1 kg)   SpO2 95%   BMI 33.89 kg/m  Physical Exam Vitals and nursing note reviewed.  Constitutional:      General: He is not in acute distress. Pulmonary:     Effort: Pulmonary effort is normal. No respiratory distress.  Abdominal:     Comments: Bilateral nephrostomy tube sites without erythema, drainage, or active bleeding; left tube with approximately 800 cc of clear yellow urine in foley bag; right tube with approximately 25 cc of bloody fluid in gravity bag.     Imaging: DG Abd 1 View  Result Date: 07/06/2019 CLINICAL DATA:  NG tube placement. EXAM: ABDOMEN - 1 VIEW COMPARISON:  CT abdomen and pelvis  06/29/2019 FINDINGS: An enteric tube terminates over the proximal to mid gastric body. Bilateral percutaneous nephrostomy tubes are present. No dilated loops of bowel are seen to suggest obstruction although the right lower abdomen and pelvis were not imaged. Widespread heterogeneous sclerosis of the included osseous structures reflects known metastatic prostate cancer. IMPRESSION: Enteric tube in the gastric body. Electronically Signed   By: Logan Bores M.D.   On: 07/06/2019 10:29   DG CHEST PORT 1 VIEW  Result Date: 07/07/2019 CLINICAL DATA:  Dyspnea. EXAM: PORTABLE CHEST 1 VIEW COMPARISON:  Chest CT 06/29/2019, chest radiograph 06/27/2019 FINDINGS: An enteric tube passes below the level of the left hemidiaphragm with tip excluded from the field of view. Shallow inspiration radiograph with accentuation of the cardiac silhouette. Heart size likely within normal limits. Similar appearance of small to moderate bilateral pleural effusions with bibasilar atelectasis. Additional ill-defined opacity within the lateral aspect of the mid to lower right lung field likely reflects the atelectasis demonstrated on prior chest CT. Redemonstrated diffuse osseous metastases. IMPRESSION: An enteric tube passes below the level of the left hemidiaphragm with tip excluded from the field of view. Otherwise, no appreciable interval change as compared to 06/29/2019. Persistent small to moderate bilateral pleural effusions with bibasilar atelectasis. Additional ill-defined opacity within the lateral aspect of the mid to lower right lung field likely corresponds with the atelectasis demonstrated on recent prior chest CT. Electronically Signed   By: Kellie Simmering DO   On: 07/07/2019 10:37    Labs:  CBC: Recent Labs    07/05/19 0436 07/05/19 0739 07/05/19 1902 07/06/19 0032 07/07/19 0230 07/07/19 1113  WBC 7.6  --   --  8.3 8.9 8.3  HGB 6.8*   < > 8.9* 8.6* 7.4* 7.4*  HCT 25.2*   < > 31.8* 30.4* 26.6* 26.2*  PLT 153  --    --  139* 140* 155   < > = values in this interval not displayed.    COAGS: Recent Labs    07/03/19 0142  INR 1.3*    BMP: Recent Labs    07/06/19 0032 07/06/19 0721 07/07/19 0230 07/07/19 1113  NA 153* 149* 149* 149*  K 3.8 3.8 3.9 4.0  CL 117* 115* 117* 116*  CO2 26 24 23 27   GLUCOSE 152* 164* 191* 189*  BUN 43* 40* 45* 42*  CALCIUM 10.4* 10.4* 10.4* 10.4*  CREATININE 2.29* 2.25* 2.41* 2.36*  GFRNONAA 31* 31* 29* 30*  GFRAA 36* 36* 34* 34*    LIVER FUNCTION TESTS: Recent Labs    07/05/19 0701 07/06/19 0721 07/07/19 0230 07/07/19 1113  BILITOT 0.3 0.6 0.6 0.4  AST 35 46* 36 36  ALT 26 27 23 23   ALKPHOS 149* 185* 233* 226*  PROT 6.1* 5.7* 5.6* 5.5*  ALBUMIN 1.5* 1.5* 1.5* 1.4*    Assessment and Plan:  History of metastatic prostate  cancer with subsequent bilateral obstructive hydronephrosis s/p bilateral percutaneous nephrostomy tube placements in IR 07/03/2019 by Dr. Laurence Ferrari. Bilateral nephrostomy tubes stable- left tube with approximately 800 cc of clear yellow urine in foley bag (additional 6800 cc output from tube in past 24 hours per chart), right tube with approximately 25 cc of bloody fluid in gravity bag (additional 75 cc output from tube in past 24 hours per chart). Continue current drain management- continue with Qshift flushes/monitor of output; urology will follow tubes on outpatient basis. Further plans per [facility]/urology/oncology- appreciate and agree with management. IR to follow.   Electronically Signed: Earley Abide, PA-C July 15, 2019, 1:04 PM   I spent a total of 25 Minutes at the the patient's bedside AND on the patient's hospital floor or unit, greater than 50% of which was counseling/coordinating care for bilateral obstructive hydronephrosis s/p bilateral nephrostomy tube placements.

## 2019-07-15 NOTE — Progress Notes (Signed)
Transition to comfort care has been noted.  Nephrology will sign off.  Please call with any questions or concerns.

## 2019-07-15 NOTE — Death Summary Note (Signed)
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 He was febrile and tachycardic in the ED with hemoglobin of 6.4 and was transfused 2 unit packed red cells; along with creatinines of 3.54. CT renal study on 06-29-19 showed invasive prostate cancer into the bladder with severe bilateral hydronephrosis for which Foley catheter was placed. Patient was admitted under [facility] service and urology was consulted. Patient was initially reluctant to have nephrostomy tube placed. Repeated images showed persistent severe bilateral hydronephrosis despite indwelling Foley catheter. Urology and nephrology again recommended bilateral nephrostomy tube placement by IR. Patient initially declined again but then agreed to after talking to his son. IR placed bilateral nephrostomy tubes on 07/03/2019. Per oncology, his cancer is not curable. Per nephrology, he is not a good candidate for hemodialysis palliative care team was also consulted.  Blood cultures were positive for Enterococcus faecalis on 06-29-19, most likely from urinary source. On IV ampicillin per infectious diseases until 07/09/2019. Repeat blood cultures from 06/26/2019 - so far. TEE completed on 06/16/2019 which showed no evidence of vegetation/endocarditis  Hospital course complicated by intermittent high-grade fevers, gross hematuria and drop in hemoglobin. He has been transfused with multiple units of packed red cells during this hospitalization and also subsequently required  intravenous Lasix intermittently. Still having intermittent hematuria from nephrostomy tube.  He was also treated with IV Rocephin and Zithromax on 06/30/2019 due to concern for possible pneumonia and completed 5 days course on 07/04/2019  On 07/03/2019, patient had acute hypovolemic hyponatremia which worsened with serum sodium peaked at 167. He was restarted on D5 water. Nephrology was reconsulted.   His overall condition did not improve.  He continued to spike temperatures despite being on IV antibiotics and his mental status did not improve.  He started becoming hypotensive.  After repeated discussions with family members, he was initially made DNR and subsequently switched to comfort measures and started on morphine drip.  He expired on 14-Jul-2019 at 07/11/2152.    Final Diagnoses:   Sepsis: Present on admission Comfort measures only status Enterococcus faecalis bacteremia Possibly community-acquired pneumonia Acute metabolic encephalopathy Acute hypovolemic hypernatremia Dysphagia Acute blood loss anemia Anemia of chronic disease Severe bilateral hydronephrosis in the setting of obstruction from progressive enlarging bladder prostate mass status post bilateral nephrostomy tube placement by IR on 07/03/2019 Acute kidney injury on chronic kidney disease stage IIIb Refractory sinus tachycardia Acute hypoxic respiratory failure secondary to pulmonary edema, atelectasis Severe protein calorie malnutrition Left lower extremity pain Generalized deconditioning/severe physical debility/overall very poor prognosis C1 lytic lesion Diabetes mellitus type II with hyperglycemia Metastatic prostate cancer   The results of significant diagnostics from this hospitalization (including imaging, microbiology, ancillary and laboratory) are listed below for reference.    Significant Diagnostic Studies: CT ABDOMEN PELVIS WO CONTRAST  Result Date: 06/29/2019 CLINICAL DATA:  Stage IV prostate carcinoma.  Chronic renal disease. Generalized weakness. Painful urination. Foley catheter placed upon admission. Anemia. EXAM: CT CHEST, ABDOMEN AND PELVIS WITHOUT CONTRAST TECHNIQUE: Multidetector CT imaging of the chest, abdomen and pelvis was performed following the standard protocol without IV contrast. COMPARISON:  CT 06-29-2019,  bone scan 12/23/2018 CT FINDINGS: CT CHEST FINDINGS Cardiovascular: No significant vascular findings. Normal heart size. No pericardial effusion. Mediastinum/Nodes: No axillary supraclavicular adenopathy. No mediastinal hilar adenopathy. No pericardial effusion Lungs/Pleura: Bilateral pleural effusions slightly increased from prior exam. There is a bibasilar atelectasis also mildly increased in LEFT lower lobe. No suspicious pulmonary nodules. 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 Several lesions are expansile within the ribs. For example the LEFT 5th rib (image 60/6) CT ABDOMEN AND PELVIS FINDINGS Hepatobiliary: No focal hepatic lesion on noncontrast exam. Gallstone within neck of the gallbladder. No gallbladder distension Pancreas: Pancreas is normal. No ductal dilatation. No pancreatic inflammation. Spleen: Normal spleen Adrenals/urinary tract: Adrenal glands are normal. There is bilateral severe hydronephrosis and hydroureter. Hydroureter extends to the urinary bladder. Findings are similar to comparison CT 06/14/2019. There is interval placement of a Foley catheter. The previous described nodule thickening the bladder is not well appreciated. The bladder is thick-walled and collapsed. Stomach/Bowel: Stomach, small-bowel and cecum are normal. The appendix is not identified but there is no pericecal inflammation to suggest appendicitis. The colon and rectosigmoid colon are normal. Rectal thermometer noted Vascular/Lymphatic: Abdominal aorta is normal caliber with atherosclerotic calcification. There is no retroperitoneal or periportal  lymphadenopathy. No pelvic lymphadenopathy. Reproductive: Prostate normal Other: No free fluid. Musculoskeletal: Widespread sclerotic skeletal metastasis unchanged. Pathologic fracture identified. IMPRESSION: Chest Impression: 1. No evidence of thoracic metastasis. 2. Interval increase in moderate effusions and basilar atelectasis. 3. Widespread skeletal metastasis Abdomen / Pelvis Impression: 1. Persistent bilateral hydronephrosis and hydroureter to the level of the bladder. Interval placement Foley catheter within thickened high-density bladder. 2. No inflammation or infection identified in the abdomen pelvis. 3. No source of hemorrhage other than high-density thickening within the bladder. Electronically Signed   By: Suzy Bouchard M.D.   On: 06/29/2019 13:05   DG Chest 2 View  Result Date: 07/06/2019 CLINICAL DATA:  Confusion. Decreased oxygen saturation. Weakness. History of prostate cancer. EXAM: CHEST - 2 VIEW COMPARISON:  Most recent radiograph 06/10/2019 FINDINGS: Upper normal heart size with unchanged mediastinal contours. Decreased right pleural effusion from prior exam. No definite acute airspace disease, blastic osseous lesions partially obscure evaluation of the underlying pulmonary parenchyma. No pulmonary edema. No pneumothorax. Multifocal blastic osseous metastatic disease, as seen on prior exam. Prior left fifth and sixth rib fractures. IMPRESSION: 1. Decreased right pleural effusion from prior exam. No definite acute abnormality. 2. Multifocal blastic osseous metastatic disease. Electronically Signed   By: Keith Rake M.D.   On: 07/04/2019 16:45   DG Chest 2 View  Result Date: 06/10/2019 CLINICAL DATA:  59 year old male with history of pleural effusion. Recurrent dyspnea. Hypoxia. EXAM: CHEST - 2 VIEW COMPARISON:  Chest x-ray 04/15/2019. FINDINGS: Small right pleural effusion. No left pleural effusion. Subsegmental atelectasis in the right lung base. No acute consolidative airspace  disease. No pneumothorax. No evidence of pulmonary edema. Heart size is normal. Upper mediastinal contours are within normal limits. Numerous sclerotic osseous lesions scattered throughout the visualized axial and appendicular skeleton, compatible with widespread metastatic disease. IMPRESSION: 1. Small right pleural effusion with some subsegmental atelectasis in the right lung base. 2. Widespread metastatic disease to the bones. Electronically Signed   By: Vinnie Langton M.D.   On: 06/10/2019 16:40   DG Abd 1 View  Result Date: 07/06/2019 CLINICAL DATA:  NG tube placement. EXAM: ABDOMEN - 1 VIEW COMPARISON:  CT abdomen and pelvis 06/29/2019 FINDINGS: An enteric tube terminates over the proximal to  mid gastric body. Bilateral percutaneous nephrostomy tubes are present. No dilated loops of bowel are seen to suggest obstruction although the right lower abdomen and pelvis were not imaged. Widespread heterogeneous sclerosis of the included osseous structures reflects known metastatic prostate cancer. IMPRESSION: Enteric tube in the gastric body. Electronically Signed   By: Logan Bores M.D.   On: 07/06/2019 10:29   CT Head Wo Contrast  Result Date: 06/24/2019 CLINICAL DATA:  Delirium.  History of prostate cancer and seizures. EXAM: CT HEAD WITHOUT CONTRAST TECHNIQUE: Contiguous axial images were obtained from the base of the skull through the vertex without intravenous contrast. COMPARISON:  MRI 05/20/2019 FINDINGS: Brain: The brain shows a normal appearance without evidence of malformation, atrophy, old or acute small or large vessel infarction, mass lesion, hemorrhage, hydrocephalus or extra-axial collection. Vascular: No hyperdense vessel. No evidence of atherosclerotic calcification. Skull: Sclerotic and mixed lytic changes of C1, the skull base and calvarium consistent with the patient's known osseous metastatic disease. Sinuses/Orbits: Sinuses are clear. Orbits appear normal. Mastoids are clear. Other:  None significant IMPRESSION: Normal appearance of the brain itself. Known metastatic disease of the bone. Electronically Signed   By: Nelson Chimes M.D.   On: 07/07/2019 17:05   CT CHEST WO CONTRAST  Result Date: 06/29/2019 CLINICAL DATA:  Stage IV prostate carcinoma. Chronic renal disease. Generalized weakness. Painful urination. Foley catheter placed upon admission. Anemia. EXAM: CT CHEST, ABDOMEN AND PELVIS WITHOUT CONTRAST TECHNIQUE: Multidetector CT imaging of the chest, abdomen and pelvis was performed following the standard protocol without IV contrast. COMPARISON:  CT 07/14/2019, bone scan 12/23/2018 CT FINDINGS: CT CHEST FINDINGS Cardiovascular: No significant vascular findings. Normal heart size. No pericardial effusion. Mediastinum/Nodes: No axillary supraclavicular adenopathy. No mediastinal hilar adenopathy. No pericardial effusion Lungs/Pleura: Bilateral pleural effusions slightly increased from prior exam. There is a bibasilar atelectasis also mildly increased in LEFT lower lobe. No suspicious pulmonary nodules. 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 Several lesions are expansile within the ribs. For example the LEFT 5th rib (image 60/6) CT ABDOMEN AND PELVIS FINDINGS Hepatobiliary: No focal hepatic lesion on noncontrast exam. Gallstone within neck of the gallbladder. No gallbladder distension Pancreas: Pancreas is normal. No ductal dilatation. No pancreatic inflammation. Spleen: Normal spleen Adrenals/urinary tract: Adrenal glands are normal. There is bilateral severe hydronephrosis and hydroureter. Hydroureter extends to the urinary bladder. Findings are similar to comparison CT 07/05/2019. There is interval placement of a Foley catheter. The previous described nodule thickening the bladder is not well appreciated. The bladder is thick-walled and collapsed. Stomach/Bowel: Stomach, small-bowel and cecum are normal. The appendix is not identified but  there is no pericecal inflammation to suggest appendicitis. The colon and rectosigmoid colon are normal. Rectal thermometer noted Vascular/Lymphatic: Abdominal aorta is normal caliber with atherosclerotic calcification. There is no retroperitoneal or periportal lymphadenopathy. No pelvic lymphadenopathy. Reproductive: Prostate normal Other: No free fluid. Musculoskeletal: Widespread sclerotic skeletal metastasis unchanged. Pathologic fracture identified. IMPRESSION: Chest Impression: 1. No evidence of thoracic metastasis. 2. Interval increase in moderate effusions and basilar atelectasis. 3. Widespread skeletal metastasis Abdomen / Pelvis Impression: 1. Persistent bilateral hydronephrosis and hydroureter to the level of the bladder. Interval placement Foley catheter within thickened high-density bladder. 2. No inflammation or infection identified in the abdomen pelvis. 3. No source of hemorrhage other than high-density thickening within the bladder. Electronically Signed   By: Suzy Bouchard M.D.   On: 06/29/2019 13:05   DG CHEST PORT 1 VIEW  Result Date: 07/07/2019 CLINICAL  DATA:  Dyspnea. EXAM: PORTABLE CHEST 1 VIEW COMPARISON:  Chest CT 06/29/2019, chest radiograph 06/27/2019 FINDINGS: An enteric tube passes below the level of the left hemidiaphragm with tip excluded from the field of view. Shallow inspiration radiograph with accentuation of the cardiac silhouette. Heart size likely within normal limits. Similar appearance of small to moderate bilateral pleural effusions with bibasilar atelectasis. Additional ill-defined opacity within the lateral aspect of the mid to lower right lung field likely reflects the atelectasis demonstrated on prior chest CT. Redemonstrated diffuse osseous metastases. IMPRESSION: An enteric tube passes below the level of the left hemidiaphragm with tip excluded from the field of view. Otherwise, no appreciable interval change as compared to 06/29/2019. Persistent small to  moderate bilateral pleural effusions with bibasilar atelectasis. Additional ill-defined opacity within the lateral aspect of the mid to lower right lung field likely corresponds with the atelectasis demonstrated on recent prior chest CT. Electronically Signed   By: Kellie Simmering DO   On: 07/07/2019 10:37   DG CHEST PORT 1 VIEW  Result Date: 06/27/2019 CLINICAL DATA:  Hypoxia EXAM: PORTABLE CHEST 1 VIEW COMPARISON:  06/20/2019 FINDINGS: Mild patchy right lower lung opacity, more conspicuous than on the prior, mild infection not excluded. Associated right basilar opacity, likely reflecting a combination of atelectasis and small pleural effusion when correlating with prior CT. Left lung is essentially clear. Cardiomegaly. Multifocal/diffuse osseous metastases. IMPRESSION: Mild patchy right lung opacity, mild infection not excluded. Small right pleural effusion with mild right lower lobe atelectasis. Multifocal/diffuse osseous metastases. Electronically Signed   By: Julian Hy M.D.   On: 06/27/2019 08:17   ECHOCARDIOGRAM COMPLETE  Result Date: 06/14/2019    ECHOCARDIOGRAM REPORT   Patient Name:   GAEL LONDO Date of Exam: 06/24/2019 Medical Rec #:  [id number]           Height:       69.0 in Accession #:    [id number]          Weight:       217.2 lb Date of Birth:  06-13-1960           BSA:          2.140 m Patient Age:    77 years            BP:           134/76 mmHg Patient Gender: M                   HR:           139 bpm. Exam Location:  Inpatient Procedure: 2D Echo, Cardiac Doppler and Color Doppler Indications:    Bacteremia 790.7 / R78.81  History:        Patient has no prior history of Echocardiogram examinations.                 Risk Factors:Dyslipidemia and Diabetes. Cancer.  Sonographer:    Jonelle Sidle Dance Referring Phys: [place]  1. Left ventricular ejection fraction, by estimation, is 50 to 55%. The left ventricle has low normal function. The left ventricle has no  regional wall motion abnormalities. Left ventricular diastolic parameters are consistent with Grade I diastolic dysfunction (impaired relaxation).  2. Right ventricular systolic function is normal. The right ventricular size is normal.  3. The mitral valve is normal in structure. No evidence of mitral valve regurgitation. No evidence of mitral stenosis.  4. The aortic valve is normal in structure. Aortic  valve regurgitation is not visualized. No aortic stenosis is present. FINDINGS  Left Ventricle: Left ventricular ejection fraction, by estimation, is 50 to 55%. The left ventricle has low normal function. The left ventricle has no regional wall motion abnormalities. The left ventricular internal cavity size was normal in size. There is no left ventricular hypertrophy. Left ventricular diastolic parameters are consistent with Grade I diastolic dysfunction (impaired relaxation). Right Ventricle: The right ventricular size is normal. No increase in right ventricular wall thickness. Right ventricular systolic function is normal. Left Atrium: Left atrial size was normal in size. Right Atrium: Right atrial size was normal in size. Pericardium: There is no evidence of pericardial effusion. Mitral Valve: The mitral valve is normal in structure. No evidence of mitral valve regurgitation. No evidence of mitral valve stenosis. Tricuspid Valve: The tricuspid valve is normal in structure. Tricuspid valve regurgitation is trivial. No evidence of tricuspid stenosis. Aortic Valve: The aortic valve is normal in structure. Aortic valve regurgitation is not visualized. No aortic stenosis is present. Pulmonic Valve: The pulmonic valve was normal in structure. Pulmonic valve regurgitation is not visualized. No evidence of pulmonic stenosis. Aorta: The aortic root and ascending aorta are structurally normal, with no evidence of dilitation. IAS/Shunts: The atrial septum is grossly normal.  LEFT VENTRICLE PLAX 2D LVIDd:         4.40 cm  LVIDs:         3.50 cm LV PW:         1.10 cm LV IVS:        1.10 cm LVOT diam:     2.00 cm LV SV:         75 LV SV Index:   35 LVOT Area:     3.14 cm  RIGHT VENTRICLE             IVC RV Basal diam:  2.70 cm     IVC diam: 2.40 cm RV S prime:     21.40 cm/s TAPSE (M-mode): 2.8 cm LEFT ATRIUM             Index       RIGHT ATRIUM           Index LA diam:        3.70 cm 1.73 cm/m  RA Area:     17.20 cm LA Vol (A2C):   29.4 ml 13.74 ml/m RA Volume:   48.40 ml  22.62 ml/m LA Vol (A4C):   29.1 ml 13.60 ml/m LA Biplane Vol: 31.6 ml 14.77 ml/m  AORTIC VALVE LVOT Vmax:   147.50 cm/s LVOT Vmean:  85.250 cm/s LVOT VTI:    0.238 m  AORTA Ao Root diam: 3.30 cm Ao Asc diam:  3.30 cm MITRAL VALVE MV Area (PHT): 5.20 cm     SHUNTS MV Decel Time: 146 msec     Systemic VTI:  0.24 m MV E velocity: 143.00 cm/s  Systemic Diam: 2.00 cm Mertie Moores MD Electronically signed by Mertie Moores MD Signature Date/Time: 06/19/2019/4:50:58 PM    Final    CT RENAL STONE STUDY  Result Date: 07/07/2019 CLINICAL DATA:  History of prostate carcinoma with renal failure EXAM: CT ABDOMEN AND PELVIS WITHOUT CONTRAST TECHNIQUE: Multidetector CT imaging of the abdomen and pelvis was performed following the standard protocol without IV contrast. COMPARISON:  12/23/2018 FINDINGS: Lower chest: Lung bases demonstrate small left pleural effusion and consolidation improved when compared with the prior exam. No new focal infiltrate is seen. Decreased attenuation is noted  in the cardiac blood pool suggestive of anemia. Hepatobiliary: Liver is within normal limits. The gallbladder is well distended with a single dependent gallstone in the gallbladder neck. No pericholecystic fluid is seen. Pancreas: Unremarkable. No pancreatic ductal dilatation or surrounding inflammatory changes. Spleen: Normal in size without focal abnormality. Adrenals/Urinary Tract: Adrenal glands are within normal limits bilaterally. Severe hydronephrosis is noted bilaterally. This  extends to the level of the urinary bladder. The bladder is partially distended. Considerable in growth of the known prostate carcinoma is noted inferiorly contributing to the hydronephrosis. Bladder wall thickening is noted likely related to a degree of bladder outlet obstruction. Stomach/Bowel: The appendix is within normal limits. The colon shows no obstructive or inflammatory changes. No small bowel or gastric abnormality is noted. Vascular/Lymphatic: Atherosclerotic calcifications of the aorta and iliac vessels are seen. No sizable lymphadenopathy is noted. Reproductive: Prostate again demonstrates in growth of tumor into the inferior aspect of bladder. This appears increased when compared with the prior exam measuring approximately 6.3 x 4.4 cm. Other: No abdominal wall hernia or abnormality. No abdominopelvic ascites. Musculoskeletal: Sclerotic bony metastatic disease is noted IMPRESSION: Enlarging prostate neoplasm with further ingrowth into the inferior aspect of the bladder with resulting severe hydronephrosis bilaterally. The degree of hydronephrosis is relatively stable when compared with the prior exam. Cholelithiasis. Right lower lobe consolidation with right-sided effusion improved from the prior exam. No other focal abnormality is noted. Electronically Signed   By: Inez Catalina M.D.   On: 07/06/2019 21:07   ECHO TEE  Result Date: 06/19/2019    TRANSESOPHOGEAL ECHO REPORT   Patient Name:   HAROLD MONCUS Date of Exam: 06/20/2019 Medical Rec #:  [id number]           Height:       69.0 in Accession #:    [id number]          Weight:       226.0 lb Date of Birth:  December 31, 1960           BSA:          2.176 m Patient Age:    89 years            BP:           132/75 mmHg Patient Gender: M                   HR:           114 bpm. Exam Location:  Inpatient Procedure: Transesophageal Echo Indications:     Bacteremia  History:         Patient has prior history of Echocardiogram examinations, most                   recent 06/27/2019. Risk Factors:Diabetes, Hypertension and                  Former Smoker. CKD.  Sonographer:     Clayton Lefort RDCS (AE) Referring Phys:  [id number] Abigail Butts Diagnosing Phys: Buford Dresser MD PROCEDURE: After discussion of the risks and benefits of a TEE, an informed consent was obtained from the patient. The transesophogeal probe was passed without difficulty through the esophogus of the patient. Sedation performed by different physician. The patient was monitored while under deep sedation. Anesthestetic sedation was provided intravenously by Anesthesiology: 200.4mg  of Propofol. Image quality was adequate. The patient developed no complications during the procedure. IMPRESSIONS  1. Left ventricular ejection fraction, by estimation, is 50  to 55%. The left ventricle has low normal function. The left ventricle has no regional wall motion abnormalities. Left ventricular diastolic function could not be evaluated.  2. Right ventricular systolic function is normal. The right ventricular size is normal. There is normal pulmonary artery systolic pressure.  3. No left atrial/left atrial appendage thrombus was detected.  4. The mitral valve is normal in structure. Trivial mitral valve regurgitation. No evidence of mitral stenosis.  5. The aortic valve is tricuspid. Aortic valve regurgitation is not visualized. No aortic stenosis is present.  6. There is mild (Grade II) plaque involving the descending aorta. Conclusion(s)/Recommendation(s): No evidence of vegetation/infective endocarditis on this transesophageal echocardiogram. FINDINGS  Left Ventricle: Left ventricular ejection fraction, by estimation, is 50 to 55%. The left ventricle has low normal function. The left ventricle has no regional wall motion abnormalities. The left ventricular internal cavity size was normal in size. There is no left ventricular hypertrophy. Left ventricular diastolic function could not be evaluated. Right  Ventricle: The right ventricular size is normal. No increase in right ventricular wall thickness. Right ventricular systolic function is normal. There is normal pulmonary artery systolic pressure. The tricuspid regurgitant velocity is 1.86 m/s, and  with an assumed right atrial pressure of 8 mmHg, the estimated right ventricular systolic pressure is 73.4 mmHg. Left Atrium: Left atrial size was not assessed. No left atrial/left atrial appendage thrombus was detected. Right Atrium: Right atrial size was not assessed. Pericardium: Trivial pericardial effusion is present. Mitral Valve: The mitral valve is normal in structure. Trivial mitral valve regurgitation. No evidence of mitral valve stenosis. There is no evidence of mitral valve vegetation. Tricuspid Valve: The tricuspid valve is normal in structure. Tricuspid valve regurgitation is trivial. No evidence of tricuspid stenosis. There is no evidence of tricuspid valve vegetation. Aortic Valve: The aortic valve is tricuspid. Aortic valve regurgitation is not visualized. No aortic stenosis is present. There is no evidence of aortic valve vegetation. Pulmonic Valve: The pulmonic valve was normal in structure. Pulmonic valve regurgitation is not visualized. No evidence of pulmonic stenosis. There is no evidence of pulmonic valve vegetation. Aorta: The aortic root is normal in size and structure. There is mild (Grade II) plaque involving the descending aorta. IAS/Shunts: No atrial level shunt detected by color flow Doppler. There is no evidence of a patent foramen ovale.  TRICUSPID VALVE TR Peak grad:   13.8 mmHg TR Vmax:        186.00 cm/s Buford Dresser MD Electronically signed by Buford Dresser MD Signature Date/Time: 07/06/2019/2:08:54 PM    Final    IR NEPHROSTOMY PLACEMENT LEFT  Result Date: 07/03/2019 INDICATION: 59 year old male with metastatic prostate cancer, bilateral obstructed hydronephrosis and acute kidney injury. He requires placement of  bilateral percutaneous nephrostomy tubes. A thoracentesis was also requested based off of CT imaging from 06/29/2019. However, when the patient was evaluated with real-time ultrasound, there is only trace pleural fluid which was insufficient for drainage. Therefore, thoracentesis was deferred. EXAM: IR NEPHROSTOMY PLACEMENT LEFT; IR NEPHROSTOMY PLACEMENT RIGHT COMPARISON:  None. MEDICATIONS: Patient currently admitted as an inpatient receiving intravenous antibiotics. No additional antibiotic prophylaxis was administered. ANESTHESIA/SEDATION: Fentanyl 100 mcg IV; Versed 2 mg IV Moderate Sedation Time:  37 minutes The patient was continuously monitored during the procedure by the interventional radiology nurse under my direct supervision. CONTRAST:  20 mL Isovue-300-administered into the collecting system(s) FLUOROSCOPY TIME:  Fluoroscopy Time: 2 minutes 12 seconds (22 mGy). COMPLICATIONS: None immediate. TECHNIQUE: The procedure, risks, benefits, and alternatives  were explained to the patient. Questions regarding the procedure were encouraged and answered. The patient understands and consents to the procedure. The left flank was prepped with chlorhexidine in a sterile fashion, and a sterile drape was applied covering the operative field. A sterile gown and sterile gloves were used for the procedure. Local anesthesia was provided with 1% Lidocaine. The left flank was interrogated with ultrasound and the left kidney identified. The kidney is hydronephrotic. A suitable access site on the skin overlying the lower pole, posterior calix was identified. After local mg anesthesia was achieved, a small skin nick was made with an 11 blade scalpel. A 21 gauge Accustick needle was then advanced under direct sonographic guidance into the lower pole of the left kidney. A 0.018 inch wire was advanced under fluoroscopic guidance into the left renal collecting system. The Accustick sheath was then advanced over the wire and a 0.018  system exchanged for a 0.035 system. Gentle hand injection of contrast material confirms placement of the sheath within the renal collecting system. There is marked hydronephrosis. The tract from the scan into the renal collecting system was then dilated serially to 10-French. A 10-French Cook all-purpose drain was then placed and positioned under fluoroscopic guidance. The locking loop is well formed within the left renal pelvis. The catheter was secured to the skin with 2-0 Prolene and a sterile bandage was placed. Catheter was left to gravity bag drainage. Using the exact same technique, a 10.2 French percutaneous nephrostomy catheter was placed in the right kidney via a posterior lower pole calyx. Contrast injection into the renal collecting system demonstrates marked hydronephrosis. Both catheters were secured to the skin with 0 Prolene suture and connected to gravity bag drainage. The patient tolerated the procedure well. IMPRESSION: Successful placement of a bilateral 10 French percutaneous nephrostomy tubes. PLAN: 1. Maintain tubes to gravity bag drainage.  Follow output. 2. Return Interventional Radiology in 8 weeks for bilateral nephrostomy tube exchange. Electronically Signed   By: Jacqulynn Cadet M.D.   On: 07/03/2019 10:19   IR NEPHROSTOMY PLACEMENT RIGHT  Result Date: 07/03/2019 INDICATION: 59 year old male with metastatic prostate cancer, bilateral obstructed hydronephrosis and acute kidney injury. He requires placement of bilateral percutaneous nephrostomy tubes. A thoracentesis was also requested based off of CT imaging from 06/29/2019. However, when the patient was evaluated with real-time ultrasound, there is only trace pleural fluid which was insufficient for drainage. Therefore, thoracentesis was deferred. EXAM: IR NEPHROSTOMY PLACEMENT LEFT; IR NEPHROSTOMY PLACEMENT RIGHT COMPARISON:  None. MEDICATIONS: Patient currently admitted as an inpatient receiving intravenous antibiotics. No  additional antibiotic prophylaxis was administered. ANESTHESIA/SEDATION: Fentanyl 100 mcg IV; Versed 2 mg IV Moderate Sedation Time:  37 minutes The patient was continuously monitored during the procedure by the interventional radiology nurse under my direct supervision. CONTRAST:  20 mL Isovue-300-administered into the collecting system(s) FLUOROSCOPY TIME:  Fluoroscopy Time: 2 minutes 12 seconds (22 mGy). COMPLICATIONS: None immediate. TECHNIQUE: The procedure, risks, benefits, and alternatives were explained to the patient. Questions regarding the procedure were encouraged and answered. The patient understands and consents to the procedure. The left flank was prepped with chlorhexidine in a sterile fashion, and a sterile drape was applied covering the operative field. A sterile gown and sterile gloves were used for the procedure. Local anesthesia was provided with 1% Lidocaine. The left flank was interrogated with ultrasound and the left kidney identified. The kidney is hydronephrotic. A suitable access site on the skin overlying the lower pole, posterior calix was identified. After  local mg anesthesia was achieved, a small skin nick was made with an 11 blade scalpel. A 21 gauge Accustick needle was then advanced under direct sonographic guidance into the lower pole of the left kidney. A 0.018 inch wire was advanced under fluoroscopic guidance into the left renal collecting system. The Accustick sheath was then advanced over the wire and a 0.018 system exchanged for a 0.035 system. Gentle hand injection of contrast material confirms placement of the sheath within the renal collecting system. There is marked hydronephrosis. The tract from the scan into the renal collecting system was then dilated serially to 10-French. A 10-French Cook all-purpose drain was then placed and positioned under fluoroscopic guidance. The locking loop is well formed within the left renal pelvis. The catheter was secured to the skin with  2-0 Prolene and a sterile bandage was placed. Catheter was left to gravity bag drainage. Using the exact same technique, a 10.2 French percutaneous nephrostomy catheter was placed in the right kidney via a posterior lower pole calyx. Contrast injection into the renal collecting system demonstrates marked hydronephrosis. Both catheters were secured to the skin with 0 Prolene suture and connected to gravity bag drainage. The patient tolerated the procedure well. IMPRESSION: Successful placement of a bilateral 10 French percutaneous nephrostomy tubes. PLAN: 1. Maintain tubes to gravity bag drainage.  Follow output. 2. Return Interventional Radiology in 8 weeks for bilateral nephrostomy tube exchange. Electronically Signed   By: Jacqulynn Cadet M.D.   On: 07/03/2019 10:19   VAS [place] LOWER EXTREMITY VENOUS (DVT)  Result Date: 06/28/2019  Lower Venous DVTStudy Indications: Swelling, Edema, and Pain.  Risk Factors: Cancer prostate. Limitations: Poor ultrasound/tissue interface. Comparison Study: No prior exam. Performing Technologist: Baldwin Crown ARDMS, RVT  Examination Guidelines: A complete evaluation includes B-mode imaging, spectral Doppler, color Doppler, and power Doppler as needed of all accessible portions of each vessel. Bilateral testing is considered an integral part of a complete examination. Limited examinations for reoccurring indications may be performed as noted. The reflux portion of the exam is performed with the patient in reverse Trendelenburg.  +---------+---------------+---------+-----------+----------+------------------+ RIGHT    CompressibilityPhasicitySpontaneityPropertiesThrombus Aging     +---------+---------------+---------+-----------+----------+------------------+ CFV      Full           Yes      Yes                                     +---------+---------------+---------+-----------+----------+------------------+ SFJ      Full                                                             +---------+---------------+---------+-----------+----------+------------------+ FV Prox  Full                                                            +---------+---------------+---------+-----------+----------+------------------+ FV Mid   Full                                                            +---------+---------------+---------+-----------+----------+------------------+  FV DistalFull                                                            +---------+---------------+---------+-----------+----------+------------------+ PFV      Full                                                            +---------+---------------+---------+-----------+----------+------------------+ POP      Full           Yes      Yes                                     +---------+---------------+---------+-----------+----------+------------------+ PTV      Full                                         visualized with                                                          color              +---------+---------------+---------+-----------+----------+------------------+ PERO     Full                                         visualized with                                                          color              +---------+---------------+---------+-----------+----------+------------------+ Hypoechoic, oval structure with echogenic center visualized in right groin measuring 4.3 x 1.4 x 2.3.  +---------+---------------+---------+-----------+----------+------------------+ LEFT     CompressibilityPhasicitySpontaneityPropertiesThrombus Aging     +---------+---------------+---------+-----------+----------+------------------+ CFV      Full           Yes      Yes                                     +---------+---------------+---------+-----------+----------+------------------+ SFJ      Full                                                             +---------+---------------+---------+-----------+----------+------------------+ FV Prox  Full                                                            +---------+---------------+---------+-----------+----------+------------------+  FV Mid   Full                                                            +---------+---------------+---------+-----------+----------+------------------+ FV DistalFull                                                            +---------+---------------+---------+-----------+----------+------------------+ PFV      Full                                                            +---------+---------------+---------+-----------+----------+------------------+ POP      Full           Yes      Yes                                     +---------+---------------+---------+-----------+----------+------------------+ PTV      Full                                         visualized with                                                          color              +---------+---------------+---------+-----------+----------+------------------+ PERO     Full                                         visualized with                                                          color              +---------+---------------+---------+-----------+----------+------------------+     Summary: RIGHT: - There is no evidence of deep vein thrombosis in the lower extremity. However, portions of this examination were limited- see technologist comments above.  - No cystic structure found in the popliteal fossa. - Hypoechoic, oval structure with echogenic center visualized in right groin measuring 4.3 x 1.4 x 2.3.  LEFT: - There is no evidence of deep vein thrombosis in the lower extremity. However, portions of this examination were limited- see technologist comments above.  - No cystic structure found in the popliteal fossa.  *See table(s) above for  measurements and observations. Electronically signed  by Ruta Hinds MD on 06/28/2019 at 7:06:18 PM.    Final     Microbiology: Recent Results (from the past 240 hour(s))  Culture, blood (routine x 2)     Status: None (Preliminary result)   Collection Time: 07/07/19 11:13 AM   Specimen: BLOOD  Result Value Ref Range Status   Specimen Description   Final    BLOOD LEFT ANTECUBITAL Performed at [facility], [place] [address] [place], [place] [address]    Special Requests   Final    BOTTLES DRAWN AEROBIC AND ANAEROBIC Blood Culture results may not be optimal due to an excessive volume of blood received in culture bottles Performed at [facility] [address] [place], [place] [address]    Culture   Final    NO GROWTH 2 DAYS Performed at [facility] [address], [place], [place] [address]    Report Status PENDING  Incomplete  Culture, blood (routine x 2)     Status: None (Preliminary result)   Collection Time: 07/07/19 11:13 AM   Specimen: BLOOD  Result Value Ref Range Status   Specimen Description   Final    BLOOD LEFT ANTECUBITAL Performed at [facility] [address], [place], [place] [address]    Special Requests   Final    BOTTLES DRAWN AEROBIC AND ANAEROBIC Blood Culture adequate volume Performed at [facility] [address] [place], [place] [address]    Culture   Final    NO GROWTH 2 DAYS Performed at [facility] [address] [place], [place] [address]    Report Status PENDING  Incomplete  Culture, Urine     Status: None   Collection Time: 07/07/19  2:15 PM   Specimen: Site Not Specified; Urine  Result Value Ref Range Status   Specimen Description   Final    SITE NOT SPECIFIED Performed at [facility] [address], [place], [place] [address]    Special Requests   Final    NONE Performed at [facility], [place]  [address], [place], [place] [address]    Culture   Final    NO GROWTH Performed at [facility], [place] [address] [place], [place] [address]    Report Status 07-11-19 FINAL  Final     Labs: Basic Metabolic Panel: Recent Labs  Lab 07/03/19 0142 07/03/19 1652 07/05/19 2118 07/05/19 2118 07/06/19 0032 07/06/19 0032 07/06/19 0721 07/06/19 0721 07/07/19 0230 07/07/19 1113  NA 153*   < > 151*  --  153*  --  149*  --  149* 149*  K 3.9   < > 3.8   < > 3.8   < > 3.8   < > 3.9 4.0  CL 117*   < > 117*  --  117*  --  115*  --  117* 116*  CO2 26   < > 24  --  26  --  24  --  23 27  GLUCOSE 182*   < > 167*  --  152*  --  164*  --  191* 189*  BUN 50*   < > 41*  --  43*  --  40*  --  45* 42*  CREATININE 3.20*   < > 2.33*  --  2.29*  --  2.25*  --  2.41* 2.36*  CALCIUM 9.7   < > 10.1  --  10.4*  --  10.4*  --  10.4* 10.4*  MG 2.1  --   --   --   --   --   --   --   --  1.7  PHOS 4.2  --   --   --   --   --   --   --   --  4.7*   < > = values in this interval not displayed.   Liver Function Tests: Recent Labs  Lab 07/05/19 0701 07/06/19 0721 07/07/19 0230 07/07/19 1113  AST 35 46* 36 36  ALT 26 27 23 23   ALKPHOS 149* 185* 233* 226*  BILITOT 0.3 0.6 0.6 0.4  PROT 6.1* 5.7* 5.6* 5.5*  ALBUMIN 1.5* 1.5* 1.5* 1.4*   No results for input(s): LIPASE, AMYLASE in the last 168 hours. Recent Labs  Lab 07/07/19 1113  AMMONIA 28   CBC: Recent Labs  Lab 07/04/19 0509 07/04/19 0509 07/05/19 0436 07/05/19 0436 07/05/19 0739 07/05/19 1902 07/06/19 0032 07/07/19 0230 07/07/19 1113  WBC 8.2  --  7.6  --   --   --  8.3 8.9 8.3  NEUTROABS 5.5  --  5.0  --   --   --  5.4 6.6 6.6  HGB 7.6*   < > 6.8*   < > 7.2* 8.9* 8.6* 7.4* 7.4*  HCT 28.4*   < > 25.2*   < > 26.8* 31.8* 30.4* 26.6* 26.2*  MCV 95.9  --  98.4  --   --   --  94.7 96.4 96.0  PLT 153  --  153  --   --   --  139* 140* 155   < > = values in this interval not displayed.   Cardiac Enzymes: No results for input(s):  CKTOTAL, CKMB, CKMBINDEX, TROPONINI in the last 168 hours. D-Dimer No results for input(s): DDIMER in the last 72 hours. BNP: Invalid input(s): POCBNP CBG: Recent Labs  Lab 07/07/19 1032 07/07/19 1206 07/07/19 1315 07/07/19 1443 07/07/19 1655  GLUCAP 183* 165* 153* 165* 180*   Anemia work up No results for input(s): VITAMINB12, FOLATE, FERRITIN, TIBC, IRON, RETICCTPCT in the last 72 hours. Urinalysis    Component Value Date/Time   COLORURINE STRAW (A) 07/07/2019 1415   APPEARANCEUR CLEAR 07/07/2019 1415   LABSPEC 1.004 (L) 07/07/2019 1415   PHURINE 7.0 07/07/2019 1415   GLUCOSEU 50 (A) 07/07/2019 1415   HGBUR MODERATE (A) 07/07/2019 1415   BILIRUBINUR NEGATIVE 07/07/2019 1415   KETONESUR NEGATIVE 07/07/2019 1415   PROTEINUR NEGATIVE 07/07/2019 1415   NITRITE NEGATIVE 07/07/2019 1415   LEUKOCYTESUR LARGE (A) 07/07/2019 1415   Sepsis Labs Invalid input(s): PROCALCITONIN,  WBC,  LACTICIDVEN     SIGNED:  Aline August, MD  [facility] 07/09/2019, 11:28 AM

## 2019-07-15 NOTE — Progress Notes (Signed)
Daily Progress Note   Patient Name: Jeffery [name]       Date: 07-09-2019 DOB: 1961/01/17  Age: 59 y.o. MRN#: [id number] Attending Physician: Aline August, MD Primary Care Physician: [name], MD Admit Date: 06/21/2019  Reason for Consultation/Follow-up: Establishing goals of care, Non pain symptom management and Pain control  Subjective: Chart reviewed and discussed with bedside RN.    Met with family at bedside including his son.  Discussed plan for comfort as well as anticipatory guidance for expectation moving forward.  Length of Stay: 15  Current Medications: Scheduled Meds:  . Chlorhexidine Gluconate Cloth  6 each Topical Daily  . mouth rinse  15 mL Mouth Rinse BID    Continuous Infusions: . morphine 2 mg/hr (09-Jul-2019 1211)    PRN Meds: acetaminophen (TYLENOL) oral liquid 160 mg/5 mL, [DISCONTINUED] acetaminophen **OR** acetaminophen, fentaNYL (SUBLIMAZE) injection, glycopyrrolate **OR** glycopyrrolate **OR** glycopyrrolate, haloperidol **OR** haloperidol **OR** haloperidol lactate, LORazepam **OR** LORazepam **OR** LORazepam, morphine, ondansetron **OR** ondansetron (ZOFRAN) IV, sodium chloride  Physical Exam         Actively dying. Unresponsive Tachypnic  Tachycardic No abdominal swelling noted  Vital Signs: BP (!) 116/47 (BP Location: Right Arm)   Pulse (!) 149   Temp (!) 101.9 F (38.8 C) (Axillary)   Resp (!) 34   Ht _0  (1.753 m)   Wt 104.1 kg   SpO2 95%   BMI 33.89 kg/m  SpO2: SpO2: 95 % O2 Device: O2 Device: Nasal Cannula O2 Flow Rate: O2 Flow Rate (L/min): 2 L/min  Intake/output summary:   Intake/Output Summary (Last 24 hours) at 07-09-2019 1326 Last data filed at 07-09-2019 0700 Gross per 24 hour  Intake 3413.51 ml  Output  5160 ml  Net -1746.49 ml   LBM: Last BM Date: 07/07/19 Baseline Weight: Weight: 98.5 kg Most recent weight: Weight: 104.1 kg       Palliative Assessment/Data:    Flowsheet Rows     Most Recent Value  Intake Tab  Referral Department  Hospitalist  Unit at Time of Referral  ICU  Palliative Care Primary Diagnosis  Sepsis/Infectious Disease  Date Notified  06/24/19  Palliative Care Type  New Palliative care  Reason for referral  Clarify Goals of Care  Date of Admission  07/04/2019  Date first seen by Palliative Care  06/26/2019  # of days Palliative referral response time  1 Day(s)  # of days IP prior to Palliative referral  1  Clinical Assessment  Palliative Performance Scale Score  30%  Psychosocial & Spiritual Assessment  Palliative Care Outcomes  Patient/Family meeting held?  Yes  Who was at the meeting?  patient, mother via phone      Patient Active Problem List   Diagnosis Date Noted  . Metabolic encephalopathy   . Pleural effusion   . Palliative care by specialist   . Goals of care, counseling/discussion   . Anemia   . Enterococcal bacteremia   . Severe sepsis ([place]) 06/24/2019  . Heme positive stool   . Anemia due to chronic blood loss   . GI bleed 06/21/2019  . Seizure-like activity ([place]) 05/20/2019  . 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 Cervical radiculopathy 02/23/2019  . Pain in thoracic spine 02/23/2019  . Neck pain 02/18/2019  . Open fracture of base of distal phalanx of left thumb 02/04/2019  . Acute renal failure superimposed on stage 3 chronic kidney disease ([place]) 10/13/2018  . Vitamin B12 deficiency 10/13/2018  . Prostate cancer ([place]) 10/13/2018  . Acute cystitis with hematuria   . Acute hyperkalemia   . DM2 (diabetes mellitus, type 2) ([place]) 10/12/2018  . Dysuria 10/12/2018  . CKD (chronic kidney disease) stage 3, GFR 30-59 ml/min 10/12/2018  . Hyperkalemia 10/12/2018  . Metabolic acidosis, normal anion gap (NAG) 10/12/2018  . Sinus  tachycardia 07/31/2016  . Diabetes mellitus, new onset ([place])   . Hyperglycemia 07/29/2016  . Urinary retention 07/29/2016  . AKI (acute kidney injury) ([place]) 07/29/2016  . Cholelithiasis 07/29/2016  . Fatty liver 07/29/2016  . Hydronephrosis 07/29/2016  . Hypertension 07/29/2016    Palliative Care Assessment & Plan   Recommendations/Plan:  Comfort care  Agree with current medications.  Made addition of morphine bolus as needed.  Anticipatory guidance provided to family at bedside.  Goals of Care and Additional Recommendations:  Limitations on Scope of Treatment: Full Comfort Care  Code Status:    Code Status Orders  (From admission, onward)         Start     Ordered   07-27-2019 1027  Do not attempt resuscitation (DNR)  Continuous    Question Answer Comment  In the event of cardiac or respiratory ARREST Do not call a "code blue"   In the event of cardiac or respiratory ARREST Do not perform Intubation, CPR, defibrillation or ACLS   In the event of cardiac or respiratory ARREST Use medication by any route, position, wound care, and other measures to relive pain and suffering. May use oxygen, suction and manual treatment of airway obstruction as needed for comfort.      07/27/19 1028        Code Status History    Date Active Date Inactive Code Status Order ID Comments User Context   07/07/2019 1219 27-Jul-2019 1028 DNR [id number]  Aline August, MD Inpatient   06/24/2019 0050 07/07/2019 1219 Full Code [id number]  Elwyn Reach, MD Inpatient   10/12/2018 0205 10/14/2018 1836 Full Code [id number]  Etta Quill, DO ED   07/29/2016 1840 08/01/2016 2043 Full Code [id number]  Florinda Marker, MD Inpatient   Advance Care Planning Activity       Prognosis:   Hours - Days  Discharge Planning:  Anticipated Hospital Death  Care plan was discussed with family, RN  Thank you for allowing the Palliative Medicine Team  to assist in the care of this patient.   Time In: 1120  Time Out: 1135 Total Time 15 Prolonged Time Billed No      Greater than 50%  of this time was spent counseling and coordinating care related to the above assessment and plan.  Micheline Rough, MD  Please contact Palliative Medicine Team phone at [phone] for questions and concerns.

## 2019-07-15 DEATH — deceased
